# Patient Record
Sex: Male | Born: 1945 | Race: White | Hispanic: No | Marital: Married
Health system: Midwestern US, Academic
[De-identification: ages and names within clinical notes are randomized; demographics above are authoritative.]

## PROBLEM LIST (undated history)

## (undated) MED FILL — OXYCODONE 5 MG TABLET: 5 5 MG | ORAL | 4 days supply | Qty: 20 | Fill #0

## (undated) MED FILL — TIZANIDINE 2 MG TABLET: 2 2 MG | ORAL | 30 days supply | Qty: 120 | Fill #0

## (undated) MED FILL — ACETAMINOPHEN 325 MG TABLET: 325 325 MG | ORAL | 9 days supply | Qty: 100 | Fill #0

## (undated) MED FILL — NALOXONE 4 MG/ACTUATION NASAL SPRAY: 4 4 mg/actuation | NASAL | Qty: 2 | Fill #0

## (undated) MED FILL — OXYCODONE 5 MG TABLET: 5 5 MG | ORAL | 5 days supply | Qty: 28 | Fill #0

## (undated) MED FILL — SENNOSIDES 8.6 MG-DOCUSATE SODIUM 50 MG TABLET: 8.6-50 8.6-50 mg | ORAL | 15 days supply | Qty: 30 | Fill #0

## (undated) MED FILL — SENNOSIDES 8.6 MG-DOCUSATE SODIUM 50 MG TABLET: 8.6-50 8.6-50 mg | ORAL | 30 days supply | Qty: 30 | Fill #0

## (undated) MED FILL — LIDOCAINE 5 % TOPICAL PATCH: 5 5 % | TOPICAL | 30 days supply | Qty: 30 | Fill #0

## (undated) MED FILL — NALOXONE 4 MG/ACTUATION NASAL SPRAY: 4 4 mg/actuation | NASAL | 1 days supply | Qty: 2 | Fill #0

---

## 2010-02-12 NOTE — Unmapped (Signed)
Signed by Luther Bradley MA on 02/12/2010 at 16:52:36    PHONE NOTE  Call back at Work Phone: [phone]  Caller: patient  Department: Neurology  Call for: Dace Denn    Reason for Call: pt needs to speak with MA about getting his records for first appt with Dr Waunita Schooner      Initial call taken by: Juliette Alcide Rippy,  February 12, 2010 3:40 PM      FOLLOW UP  Spoke with pt. who states he is trying to get some records for [place], but when he called was told they might not get them to [place] before Tues. appt.  phone call completed  Follow-up by:  Luther Bradley MA,  February 12, 2010 4:52 PM

## 2010-02-12 NOTE — Unmapped (Signed)
Signed by Loura Halt MD on 02/12/2010 at 00:00:00  Neurology Media Consent      Imported By: Coletta Memos 05/03/2010 12:47:10    _____________________________________________________________________    External Attachment:    Please see [facility] EMR for this document.

## 2010-02-14 NOTE — Unmapped (Signed)
Signed by Loura Halt MD on 02/14/2010 at 00:00:00  Intake Neuro      Imported By: Betsey Amen 03/25/2010 13:02:40    _____________________________________________________________________    External Attachment:    Please see [facility] EMR for this document.

## 2010-02-16 ENCOUNTER — Inpatient Hospital Stay: Attending: Neurology

## 2010-02-16 NOTE — Unmapped (Signed)
Signed by Loura Halt MD on 02/16/2010 at 12:45:37    [facility]  ___________________________________________________________________________  Office Visit    History of Present Illness   Patient seen in consultation at the request of:   Matthew Apple, MD    Matthew [name] was seen in consultation for the evaluation of cervical movements and shoulder pain. He has noted a tendency for the neck to pull to the left over the last year, more consistently for the last six months. 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 Stress may make these events more common. He has noted that touching the face completely controls these movements although his wife finds that disrupting. He spoke of an urge but questioning did not reveal that these movements were truly voluntary. He has also had left shoulder blade pain, which he attributes to the need to try to keep his head in mid position, which is hard without using his hands. Additionally, he has had symptoms of regular movements of legs and arms through the night, which do not seem to represent dream enactment behaviors. Treatment with mirapex and ambien only caused excessive sedation. Clonazepam is helping with his sleep and a bit with his posturing.       PAST HISTORY  Past Medical History:  Environmental Allergies, Sleep Disorder, Trouble with head turning to the left, Rosacea  Surgical History:  Hernia Surgery: 1953, Achilles Tendon Repair: left 1/04, Arthroscopic Knee Surgery: left 1976    Family History: Mother-Deceased-Cancer of Colon  Father-Deceased-Heart Failure  Social History: Preferred Language: English,   Marital Status: married,   Children: 2,   Occupation: Pensions consultant  Alcohol Use: 3-4/wk  Drug Use: none  Tobacco  Usage:non-smoker    Review of Systems   Refer to HPI for review of systems documentation.  General: Denies chills, sweats, anorexia.   Eyes: Denies blurring, diplopia, irritation.   Ears/Nose/Throat: Complains of decreased hearing. Denies dysphagia, ear discharge.   Cardiovascular: Denies chest pains, dizziness.   Respiratory: Denies tachypnea, excessive sputum.   Gastrointestinal: Denies vomiting, diarrhea, constipation.   Genitourinary: Denies urinary frequency, urinary hesitancy, urgency.   Musculoskeletal: Complains of back pain, neck pain.   Skin: Denies skin color change, birthmarks.   Neurologic: Denies transient paralysis, weakness, word finding problems, concentration problems, gait difficulties, falls.   Psychiatric: Denies depression, anxiety, memory loss.   Endocrine: Denies cold intolerance, heat intolerance.       Vital Signs    Weight: 189 lbs. Pulse rate: 84   Blood Pressure   BP #1: 142 / 92mm Hg  Cuff Size: Std     Allergies  No Known Allergies    Medications on Intake:    CLONAZEPAM 0.5 MG TABS (CLONAZEPAM) 1 by mouth daily  DOXYCYCLINE HYCLATE 50 MG CAPS (DOXYCYCLINE HYCLATE)         Medications reviewed, updated and verified with patient or patient representative.    Screening for unhealthy alcohol use performed.  Women (Any Age) or Men (over Age 46): less than 7 standard drinks per week      Physical Examination    Mental Status   Appearance: normal  Orientation: obviously normal  Memory: grossly intact  Fund of Knowledge: grossly intact  Speech Characteristics  Normal.  Language: normal  Affect:   euthymic  Mood:  euthymic  Thought Content:     normal  Thought Process:   normal    Cranial Nerves   Pupils:   equal, round, reactive to light  Visual Fields:   full to confrontation  Extraocular Movements:     full  Smooth Pursuit:     normal  Saccade Velocity:     normal  Facial Sensation:   bilateral facial sensation normal  Facial Strength: normal facial symmetry and strength       Facial  Expression:    normal  Hearing:   diminished hearing on the left  Palate:   soft palate elevates in the midline spontaneously  Sternocleidomastoid/Trapezius:   full shoulder shrug equally  Tongue:   tongue protrudes midline    Motor Exam     Muscle Tone (0 to 4 scale with 0 being normal):     Muscle tone is normal in all muscle groups of the upper and lower extremities.    Other Motor Examinations (rated on a 0-4 scale, with 0 being absent):       Dystonia - At Rest:     Neck/Shoulder:   2    Dystonia - With Action:     Neck/Shoulder:   1      Strength:     Muscle strength is 5/5 in all major muscle groups.    Reflexes   Muscle stretch reflexes are normal in bilateral upper and lower extremities with negative Babinski responses.    Sensory Examination   Normal to light touch, pinprick, temperature, vibration and proprioception except as noted.    Temperature:     Right Lower Extremity: diminished  Left Lower Extremity: diminished    Vibration:     Right Lower Extremity: diminished  Left Lower Extremity: diminished    Coordination     Tremor:      Resting tremor is absent.   Intention tremor is absent.   Postural tremor is absent.   Action tremor is absent.      Finger-to-Nose:     Right:   no ataxia  Left:   no ataxia    Heel-Knee-Shin:     Right:   no ataxia  Left:   no ataxia    Romberg:    normal    Gait       Base:   normal    Initiation and Freezing:          Starting:   normal       Turning:   normal       Walking through doorways:   normal       Walking straight:   normal    Steps:  normal  Propulsion:   normal  Retropulsion:   normal  Right arm swing:   normal  Left arm swing:   normal  Turning:   normal  Heel walking normal  Toe walking:   normal  Tandem gait:   normal      [place] Spasmodic Torticollis Rating Scale     I. Torticollis Severity Scale          (A) Maximal Excursion                  Chin Rotation:  3 - Moderate (1/2 to 3/4 range, 46-67 degrees) left                 Laterocollis:  0 -  None  Anterocollis or Retrocolllis Rating:  0 - None               Lateral Shift:  0 - Absent               Sagittal Shift:  0 - Absent      (B) Duration:  :  5 - Constant deviation (>75% of the time, often maximal)        (C) Effects of Sensory Tricks:  0 - Complete relief by one or more tricks          (D) Shoulder Elevation:  1 - Mild (less than 1/3 range), intermittent or constant          (E) Range of Motion  0 - Able to move to extreme opposite position          (F) Time:  4 - < 15 seconds    Torticollis Severity Scale (out of 35):   18    II.  Disability Scale                  Work:  1 - Normal work expectations with satisfactory performance at usual level of occupation but some interference by torticollis                 Activities of Daily Living (e.g., feeding, dressing, or hygiene, including washing, shaving, makeup, etc.):  0 - No difficulty with any activity                 Driving:  0 - No difficulty (or has never driven a car)                 Reading:  1 - Unlimited ability to read in normal seated position but bothered by torticollis                 Television:  1 - Unlimited ability to watch television in normal seated position but bothered by torticollis                 Activities outside the home (e.g., shopping, walking about, movies, dining, and other recreational activities):  1 - Unlimited activities but bothered by torticollis    Total Disability Scale (out of 30):   4    III.  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 There were no additional abnormalties on neurological exam, except for chronic peripheral neuropathy, to suggest segmental spread or associated deficits. 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    Plan   After discussing the various options, we decided in favor of initiating a trial with Dysport for cervical dystonia. He will also participate  in the Anchor CD study to gather additional quality of life information before and after injections with Dysport. He may also consider participating in a natural history and biorepository study sponsored by the [employer]. We will see him again within the context of the chemodenervation session.    CC:   Matthew Eaton, MD  Matthew Kerns, MD

## 2010-02-17 NOTE — Unmapped (Signed)
Signed by Loura Halt MD on 02/17/2010 at 00:00:00  Transferred Records       Imported By: Coletta Memos 05/02/2010 22:59:39    _____________________________________________________________________    External Attachment:    Please see [facility] EMR for this document.

## 2010-02-26 NOTE — Unmapped (Signed)
Signed by Loura Halt MD on 03/01/2010 at 12:01:57    PHONE NOTE  Caller's Cell Phone #: [phone]  Caller: patient  Department: Neurology    Reason for Call: pt called to schedule btx appt. was under the impression he was to get a call from office and would like feb. appt. I don't have anything avail on his schedule in feb.      Initial call taken by: Campbell Stall,  February 26, 2010 10:51 AM      FOLLOW UP  He can be scheduled as an add-on (squeeze-in) to the February Botox clinic, which I believe is on February 24. Christella Hartigan will also need to come in as this patient will be filling out some questionnaires in addition to his Botox injection.  Follow-up by:  Loura Halt MD,  March 01, 2010 12:01 PM

## 2010-03-03 NOTE — Unmapped (Signed)
Signed by Luther Bradley MA on 03/03/2010 at [facility]                        [facility]         Neurology         [address]         [place], [place] [address]         p [phone] f [phone]         [email or web address]      March 03, 2010      RE: DADE RODIN  DOB:   [birth year]      Dear Dr. Tyrone Apple,    I had the pleasure of seeing your patient, Matthew [name], in consultation in the [facility] on 02/26/2010.     History of Present Illness:  Mr. Gabrielson was seen in consultation for the evaluation of cervical movements and shoulder pain. He has noted a tendency for the neck to pull to the left over the last year, more consistently for the last six months. 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 Stress may make these events more common. He has noted that touching the face completely controls these movements although his wife finds that disrupting. He spoke of an urge but questioning did not reveal that these movements were truly voluntary. He has also had left shoulder blade pain, which he attributes to the need to try to keep his head in mid position, which is hard without using his hands. Additionally, he has had symptoms of regular movements of legs and arms through the night, which do not seem to represent dream enactment behaviors. Treatment with mirapex and ambien only caused excessive sedation. Clonazepam is helping with his sleep and a bit with his posturing.     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 There were no additional abnormalties on neurological exam, except for chronic peripheral neuropathy, to suggest segmental spread or associated deficits. 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     Plan:  After discussing the various options, we decided in favor of initiating a trial with Dysport for cervical dystonia. He will also participate in the Anchor CD study to gather additional quality of life information before and after injections with Dysport. He may also consider participating in a natural history and biorepository study sponsored by the [employer]. We will see him again within the context of the chemodenervation session.     Thank you for allowing me to participate in the care of this pleasant patient.  If you would like a copy of my office note, please contact our Medical Records department at [phone].  Please feel free to contact me should you have any concerns or questions.      Sincerely,      Loura Halt, MD

## 2010-03-12 NOTE — Unmapped (Signed)
Signed by Luther Bradley MA on 03/12/2010 at 15:23:12    PHONE NOTE  Call back at Work Phone: [phone]  Caller: patient  Department: Neurology  Call for: Mycala Warshawsky    Reason for Call: refill: klonopin .5mg  tabs, uses [facility] [phone]      Initial call taken by: Juliette Alcide Rippy,  March 12, 2010 3:07 PM      Prescriptions:  CLONAZEPAM 0.5 MG TABS (CLONAZEPAM) 1 by mouth daily  #30 x 1   Entered by: Luther Bradley MA   Authorized by: Loura Halt MD   Signed by: Luther Bradley MA on 03/12/2010   Method used: Telephoned to ...     [facility]retail)     [address]     [place], [place]  [address]  [place]     Ph: [phone]     Fax: [phone]   RxID: [id number]

## 2010-03-15 NOTE — Unmapped (Signed)
Signed by Loura Halt MD on 03/15/2010 at 00:00:00  Prior Authorization      Imported By: Coletta Memos 05/04/2010 22:55:04    _____________________________________________________________________    External Attachment:    Please see [facility] EMR for this document.

## 2010-03-25 NOTE — Unmapped (Signed)
Signed by Loura Halt MD on 03/25/2010 at 00:00:00  Botox Consent      Imported By: Coletta Memos 05/06/2010 10:48:56    _____________________________________________________________________    External Attachment:    Please see [facility] EMR for this document.

## 2010-03-26 ENCOUNTER — Inpatient Hospital Stay: Attending: Neurology

## 2010-03-26 NOTE — Unmapped (Addendum)
Signed by Loura Halt MD on 03/26/2010 at 08:29:49    [facility]  ___________________________________________________________________________  Office Visit    History of Present Illness   Patient seen in consultation at the request of:   Tyrone Apple, MD    Mr. Klipfel returned for his first injection session with Dysport to address his right torticollis and mild left shoulder elevation. EMG guidance ensured injections were given to the core of the dystonic activity of the right sternocleidomastoid, left splenius capitis, and left trapezius (painful area). He tolerated the procedure well.            Vital Signs    Weight: 187 lbs. Pulse rate: 62   Blood Pressure   BP #1: 122 / 78mm Hg  Cuff Size: Std     Allergies  No Known Allergies  Medications reviewed, updated and verified with patient or patient representative.    Screening for unhealthy alcohol use performed.  Previous Screening:   Screening for unhealthy alcohol use performed. (02/16/2010 11:03:55 AM)  Women (Any Age) or Men (over Age 79): less than 7 standard drinks per week      Physical Examination          Botox Injection     Indications:     Diagnosis:  DYSTONIA - CERVICAL (HYQ-657.84)  Operator: Loura Halt MD    Procedure:   After informed consent was obtained, the patient was injected with  500 Dysport  diluted in 2 ml of normal saline were injected in the following areas:     - 187.5 units in the right sternocleidomastoid in 3 divided doses.    - 125 units in the left splenius capitis in 2 divided doses.    - 125 units in the left trapezius (painful areas, interscapular region) in 2 divided doses.  The injections were performed under EMG guidance.  The patient tolerated the procedure well and without side effects.  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    Plan   A three month reinjection session is recommended. He is also participating in the Anchor-CD study, to collect clinical outcome data on patients with CD injected with Dysport.    CC:   Rebecca Eaton, MD  Sherren Kerns, MD

## 2010-05-03 ENCOUNTER — Inpatient Hospital Stay

## 2010-05-03 NOTE — Unmapped (Signed)
Signed by Wynona Dove on 05/26/2010 at 16:19:41    Matthew [name] was seen today part of the Anchor/Dysport study along with Dr. Waunita Schooner. All study assessments and requirements were met per the protocol.

## 2010-05-03 NOTE — Unmapped (Signed)
Signed by Dairl Ponder on 05/03/2010 at 10:27:14    Prescriptions:  CLONAZEPAM 0.5 MG TABS (CLONAZEPAM) 1 by mouth daily  #30 x 5   Entered by: Dairl Ponder   Authorized by: Loura Halt MD   Signed by: Dairl Ponder on 05/03/2010   Method used: Print then Give to Patient   RxID: [id number]

## 2010-06-21 NOTE — Unmapped (Signed)
Signed by Luther Bradley MA on 06/23/2010 at 14:37:42    PHONE NOTE  Call back at Work Phone: [phone]  Caller: patient  Department: Neurology  Call for: Jordell Outten    Reason for Call: Pt asks for you to please call him at work. Thanks!       Initial call taken by: Catalina Lunger,  Jun 21, 2010 1:46 PM      FOLLOW UP  LMOV for him to call me back if he still needs to speak to me.   Follow-up by:  Dairl Ponder,  Jun 21, 2010 2:07 PM    FOLLOW UP  another message. I will forward back to [employer] desktop as I will be in clinic tomorrow and wont be watching her desktop.  left message to call back  Follow-up by:  Dairl Ponder,  Jun 21, 2010 2:56 PM    FOLLOW UP  just talked to the pt and he said that the study would pay for part of the dysport inj.   he has an ok from the ins co to get inj also.  he received a bill for over 10, 000.00.   i emailed Marcelino Duster K and Dr. Waunita Schooner to talk to the pt about how much each should pay.   Follow-up by:  Dairl Ponder,  Jun 21, 2010 3:26 PM    FOLLOW UP  Spoke with pt. and told him I had spoken with Marylene Land the manager, and she said it was a billing error, that would be adjusted and he would receive a new bill..  patient advised  Follow-up by:  Luther Bradley MA,  Jun 23, 2010 2:37 PM

## 2010-06-25 ENCOUNTER — Inpatient Hospital Stay

## 2010-06-25 NOTE — Unmapped (Signed)
Signed by Loura Halt MD on 06/25/2010 at 17:14:43    [facility]  ___________________________________________________________________________  Office Visit    History of Present Illness   Patient seen in consultation at the request of:   Tyrone Apple, MD    Mr. Zeidman returned for his second injection session with Dysport to address his left torticollis and mild left shoulder elevation. EMG guidance ensured injections were given to the core of the dystonic activity of the right sternocleidomastoid, left splenius capitis, and left trapezius (painful area). We increased the dose of the left splenius capitis. He tolerated the procedure well.            Vital Signs    Weight: 188 lbs. Pulse rate: 70   Blood Pressure   BP #1: 120 / 80mm Hg  Cuff Size: Std     Allergies  No Known Allergies  Medications reviewed, updated and verified with patient or patient representative.    Screening for unhealthy alcohol use performed.  Previous Screening:   Screening for unhealthy alcohol use performed. (03/26/2010 8:21:49 AM)  Women (Any Age) or Men (over Age 23): less than 7 standard drinks per week    Depression Screening:     During the past month,   Have you often been bothered by feeling down, depressed or hopeless? No  Have you often been bothered by little interest or pleasure in doing things? 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    - 250 units in the left splenius capitis (superficial) in 4 divided doses.    - 125 units in the left trapezius (painful areas, interscapular region) in 2 divided doses.  The injections were  performed under EMG guidance.  The patient tolerated the procedure well and without side effects.  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    Plan   A three month reinjection session is recommended. He is also participating in the Anchor-CD study, to collect clinical outcome data on patients with CD injected with Dysport.    CC:   Rebecca Eaton, MD  Sherren Kerns, MD

## 2010-06-25 NOTE — Unmapped (Signed)
Signed by Wynona Dove on 06/29/2010 at 08:57:44    [facility]  ___________________________________________________________________________  Office Visit    History of Present Illness   Patient seen in consultation at the request of:   Tyrone Apple, MD                Allergies  No Known Allergies  Previous Screening:   Screening for unhealthy alcohol use performed. (06/25/2010 4:45:43 PM)      Physical Examination            Problems:    Patient was seen today in the office for the Dysport(Anchor study). All study Assessments were completed per the protocol. Patient extremely happy with the treatment      CC:   Rebecca Eaton, MD  Sherren Kerns, MD

## 2010-06-25 NOTE — Unmapped (Addendum)
Signed by Loura Halt MD on 06/25/2010 at 00:00:00  Botox Consent      Imported By: Coletta Memos 07/02/2010 11:57:58    _____________________________________________________________________    External Attachment:    Please see [employer] EMR for this centepic_document_incr.

## 2010-07-13 ENCOUNTER — Inpatient Hospital Stay

## 2010-07-13 NOTE — Unmapped (Signed)
Signed by Loura Halt MD on 07/13/2010 at 17:21:37    [facility]  ___________________________________________________________________________  Office Visit    History of Present Illness   Patient seen in consultation at the request of:   Tyrone Apple, MD    Patient was seen today in the office for the Natural History Dystonia Study.  Patient agreed to participate in  the trial and signed consent for the study.  All study required assessments were completed as required by physician or study coordinator.    [facility]  Loura Halt, MD              Allergies  No Known Allergies  Previous Screening:   Screening for unhealthy alcohol use performed. (06/25/2010 4:45:43 PM)      Physical Examination            CC:   Rebecca Eaton, MD  Sherren Kerns, MD

## 2010-09-10 ENCOUNTER — Inpatient Hospital Stay: Attending: Neurology

## 2010-09-10 ENCOUNTER — Ambulatory Visit: Admit: 2010-09-10 | Discharge: 2010-09-10 | Payer: PRIVATE HEALTH INSURANCE | Attending: Neurology

## 2010-09-10 DIAGNOSIS — G243 Spasmodic torticollis: Secondary | ICD-10-CM

## 2010-09-10 MED ORDER — abobotulinum toxin A (DYSPORT) SolR 1,000 Units
500 | Freq: Once | INTRAMUSCULAR | Status: AC
Start: 2010-09-10 — End: 2010-09-10
  Administered 2010-09-10: 20:00:00 via INTRAMUSCULAR

## 2010-09-10 NOTE — Unmapped (Signed)
Subjective:      Patient ID: Matthew [name] is a 65 y.o. male.    HPI   Matthew [name] returned for his second injection session with Dysport to address his left torticollis and mild left shoulder elevation. EMG guidance ensured injections were given to the core of the dystonic activity of the right sternocleidomastoid, left splenius capitis, and left trapezius (painful area). We increased the dose of the left splenius capitis. He tolerated the procedure well.          Histories:     He has a past medical history of Environmental allergies; Sleep disorder; and Neck symptom.    He has past surgical history that includes Hernia repair [phone]); Achilles tendon repair (1/04); and Knee arthroscopy (1976).    His family history includes Colon cancer in his mother and Heart failure in his father.    He reports that he has never smoked. He does not have any smokeless tobacco history on file. He reports that he drinks alcohol. He reports that he does not use illicit drugs.      Review of Systems    Allergies:   Review of patient's allergies indicates no known allergies.    Medications:     Outpatient Encounter Prescriptions as of 09/10/2010   Medication Sig Dispense Refill   ??? clonazepam (KLONOPIN) 0.5 MG tablet Take 0.5 mg by mouth daily.         ??? doxycycline (VIBRAMYCIN) 50 MG capsule Take 50 mg by mouth.              Objective:       Blood pressure 128/60, pulse 60, weight 188 lb (85.276 kg).    Neurologic Exam    Physical Exam    Botulinum Toxin Injection Summary  Botox  Botulinum toxin used: Dysport  Dilution (units/volume): 500/2 ml  Total units used: 625   Number of units discarded: 375  EMG Guidance: The injections were performed under EMG guidance.  Muscle 1  Muscle 1: L Splenius capitus  Muscle 1 - Units: 375  Muscle 1 - # Sites: 6  Muscle 2  Muscle 2: R Sternocleidomastoid  Muscle 2 - Units: 125  Muscle 2 - # Sites: 2  Muscle 3  Muscle 3: L Trapezius  Muscle 3 - Units: 125  Muscle 3 - # Sites:  2    Diagnosis:    1. Spasmodic torticollis         Assessment:     EMG-guided injections with Dysport took place uneventfully to address his left torticollis and mild left shoulder elevation.     Plan:     A three month reinjection session is recommended. He is also participating in the Anchor-CD study, to collect clinical outcome data on patients with CD injected with Dysport

## 2010-09-13 NOTE — Unmapped (Incomplete)
Patient was seen today in the office for the Dysport/Anchor Study. Subject agreed to participate, consent was signed . All study required assessments were completed as required by physician and Study Coordinator. Patient is responding well and satisfied with the treatment.    Carlyon Shadow, MD

## 2010-11-10 NOTE — Unmapped (Signed)
Called Rx into pharm          No problem, [name]. [name] is the expert in the refill department. I am copying her. She will do this tomorrow.    All the best,  [name]      From: [name] [email or web address]  Date: Wednesday, November 10, 2010 5:14 PM  To: [name]>  Subject: Klonopin    Dr.,  Can you renew my prescription for Klonopin?  It helps me sleep better.    I use the [employer], [phone]  Thanks,  Matthew [name]

## 2010-11-11 MED ORDER — clonazePAM (KLONOPIN) 0.5 MG tablet
0.5 | ORAL_TABLET | Freq: Every day | ORAL | Status: AC
Start: 2010-11-11 — End: 2011-02-07

## 2010-12-17 ENCOUNTER — Ambulatory Visit: Admit: 2010-12-17 | Discharge: 2010-12-17 | Payer: PRIVATE HEALTH INSURANCE | Attending: Neurology

## 2010-12-17 ENCOUNTER — Encounter: Payer: PRIVATE HEALTH INSURANCE | Attending: Neurology

## 2010-12-17 DIAGNOSIS — G243 Spasmodic torticollis: Secondary | ICD-10-CM

## 2010-12-17 MED ORDER — abobotulinum toxin A (DYSPORT) SolR 1,000 Units
500 | Freq: Once | INTRAMUSCULAR | Status: AC
Start: 2010-12-17 — End: 2010-12-17
  Administered 2010-12-17: 16:00:00 via INTRAMUSCULAR

## 2010-12-17 NOTE — Unmapped (Signed)
Subjective:      Patient ID: Matthew [name] is a 65 y.o. male.    Movement Disorders HPI    Mr. Shrewsberry returned for his third injection session with Dysport to address his left torticollis and mild left shoulder elevation. EMG guidance ensured injections were given to the core of the dystonic activity of the right sternocleidomastoid, left splenius capitis, and left trapezius (painful area). We kept the dose the same as his last procedure, given the reported benefits. He tolerated the procedure well.        Histories:     He has a past medical history of Environmental allergies; Sleep disorder; and Neck symptom.    He has past surgical history that includes Hernia repair [phone]); Achilles tendon repair (1/04); and Knee arthroscopy (1976).    His family history includes Colon cancer in his mother and Heart failure in his father.    He reports that he has never smoked. He does not have any smokeless tobacco history on file. He reports that he drinks alcohol. He reports that he does not use illicit drugs.    Review of Systems  Refer to HPI for review of systems documentation.    Allergies:   Review of patient's allergies indicates no known allergies.    Medications:     Outpatient Encounter Prescriptions as of 12/17/2010   Medication Sig Dispense Refill   ??? clonazePAM (KLONOPIN) 0.5 MG tablet Take 1 tablet (0.5 mg total) by mouth daily.  30 tablet  2   ??? doxycycline (VIBRAMYCIN) 50 MG capsule Take 50 mg by mouth.             Objective:       Blood pressure 120/78, pulse 64, height 6' (1.829 m), weight 186 lb (84.369 kg).    Botulinum Toxin Injection Summary  Botox  Botulinum toxin used: Dysport  Dilution (units/volume): 500:2  Total units used: 625   Number of units discarded: 375  EMG Guidance: The injections were performed under EMG guidance.  Muscle 1  Muscle 1: L Splenius capitus  Muscle 1 - Units: 375  Muscle 1 - # Sites: 6  Muscle 2  Muscle 2: R Sternocleidomastoid  Muscle 2 - Units: 125  Muscle 2  - # Sites: 2  Muscle 3  Muscle 3: L Trapezius  Muscle 3 - Units: 125  Muscle 3 - # Sites: 2          Assessment:     1. Spasmodic torticollis      EMG-guided injections with Dysport took place uneventfully to address his left torticollis and mild left shoulder elevation.    Plan:     A three month reinjection session is recommended.

## 2010-12-20 NOTE — Unmapped (Unsigned)
Patient was seen in the office today for the last visit (Anchor Dysport Study), all visits completed per protocol compliance. All necessary study assessments were completed by PI and study coordinator. Patient was extremely pleased with the treatment and Study  outcome.    Wynona Dove

## 2011-02-07 NOTE — Unmapped (Signed)
Pt is needing a refill on his klonopin medication. [facility] in [place] [phone]

## 2011-02-08 MED ORDER — clonazePAM (KLONOPIN) 0.5 MG tablet
0.5 | ORAL_TABLET | Freq: Every day | ORAL | Status: AC
Start: 2011-02-08 — End: 2011-08-11

## 2011-03-25 ENCOUNTER — Ambulatory Visit: Admit: 2011-03-25 | Discharge: 2011-03-25 | Payer: PRIVATE HEALTH INSURANCE | Attending: Neurology

## 2011-03-25 DIAGNOSIS — G243 Spasmodic torticollis: Secondary | ICD-10-CM

## 2011-03-25 MED ORDER — ondansetron (ZOFRAN) 8 MG tablet
8 | ORAL_TABLET | Freq: Three times a day (TID) | ORAL | Status: AC | PRN
Start: 2011-03-25 — End: 2014-02-04

## 2011-03-25 MED ORDER — abobotulinum toxin A (DYSPORT) SolR 1,000 Units
500 | Freq: Once | INTRAMUSCULAR | Status: AC
Start: 2011-03-25 — End: 2011-03-25
  Administered 2011-03-25: 14:00:00 via INTRAMUSCULAR

## 2011-03-25 NOTE — Unmapped (Signed)
Dear [name],    Thank you for the update. Glad to know he is feeling better now. I would agree that the method of delivery is not suitable for him. As such, I will suggest to move away from the EMG guidance that we have used for the injections and use instead anatomical landmarks. As briefly mentioned, this method has a lower accuracy rate but may not be associated with significantly less benefits. Importantly, we would not need to use the EMG needles. Instead, we would only use the regular 30 ga needles, which are tiny. Most people don't feel them at all.    I am so sorry that I caused this episode of vasovagal syncope. Hopefully this will be his last!    Take good care and keep in touch,  [name]    From: Shanon Brow [email or web address]>  Date: Friday, March 25, 2011 2:18 PM  To: Alberto Espay [email or web address]>  Cc: [name] [email or web address]>, Lauren [email or web address]>  Subject: Shawn Stall  Resent-From: [name] [email or web address]>  Resent-To: [name] work [email or web address]>  Resent-Date: Friday, March 25, 2011 2:23 PM    Dr. Waunita Schooner,     [name] asked me to email you since he's trying to relax right now.  I am also copying our daughter, who is a pediatric hospitalist.  (If you recall, she has a physician friend here in [place] who recommended you to [place].)    We left the hospital just after 10:00 a.m. immediately after [name] took Zofran with a small sip of water, and then he had violent vomiting in the car on the way home.  It was quite unpleasant since he did not have much in his stomach.  He went to bed and just woke up at about 1:30.  He has had soup and no longer feels nauseous.  His upper body feels stiff and he feels tired.    He said that when you were injecting his left side, each injection hurt.  He does not remember that each hurt in the past.  He did not feel much with the injections on the right side (although that's where he  has a big bruise).      In discussing with him whether he thinks his reactions are due to the medicine or to the method of delivery, he believes that today's was probably due to the delivery, but that the previous weeks-long negative reaction after his 3rd treatment was due to the Dysport.  He may have also reacted to the delivery method immediately after he left the office, but since he had flu-like symptoms for more than three weeks, he feels that that was a reaction to the medicine.      Thank you for your attention.  [name]   [phone] (c)  [phone] (h)

## 2011-03-25 NOTE — Unmapped (Signed)
Subjective:      Patient ID: Matthew [name] is a 66 y.o. male.    Movement Disorders HPI    Matthew [name] returned for his reinjection session with Dysport to address his left torticollis and mild left shoulder elevation. Response has been excellent. EMG guidance ensured injections were given to the core of the dystonic activity of the right sternocleidomastoid, left splenius capitis, and left trapezius (painful area). We kept the dose the same as his last procedure, given the reported benefits. His injections were complicated with a vasovagal syncope.           Histories:     He has a past medical history of Environmental allergies; Sleep disorder; and Neck symptom.    He has past surgical history that includes Hernia repair [phone]); Achilles tendon repair (1/04); and Knee arthroscopy (1976).    His family history includes Colon cancer in his mother and Heart failure in his father.    He reports that he has never smoked. He does not have any smokeless tobacco history on file. He reports that he drinks alcohol. He reports that he does not use illicit drugs.    Review of Systems  Refer to HPI for review of systems documentation.    Allergies:   Review of patient's allergies indicates no known allergies.    Medications:     Outpatient Encounter Prescriptions as of 03/25/2011   Medication Sig Dispense Refill   ??? clonazePAM (KLONOPIN) 0.5 MG tablet Take 1 tablet (0.5 mg total) by mouth daily.  30 tablet  5   ??? doxycycline (VIBRAMYCIN) 50 MG capsule Take 50 mg by mouth.             Objective:       Blood pressure 120/82, pulse 68, height 6' (1.829 m), weight 185 lb (83.915 kg).    Botulinum Toxin Injection Summary  Botox  Botulinum toxin used: Dysport  Dilution (units/volume): 500:2  Total units used: 625   Number of units discarded: 375  EMG Guidance: The injections were performed under EMG guidance.  Muscle 1  Muscle 1: L Splenius capitus  Muscle 1 - Units: 375  Muscle 1 - # Sites: 6  Muscle 2  Muscle 2: R  Sternocleidomastoid  Muscle 2 - Units: 125  Muscle 2 - # Sites: 2  Muscle 3  Muscle 3: L Trapezius  Muscle 3 - Units: 125  Muscle 3 - # Sites: 2     Assessment  Tolerated Procedure:  (He developed a vasovagal reflex with the EMG needle upon the)         Assessment:     1. Spasmodic torticollis      EMG-guided injections with Dysport took were given to address his left torticollis and mild left shoulder elevation. The procedure was complicated by the development of vasovagal syncope, from which he recovered after a period of observation.      Plan:     A three month reinjection is recommended. EMG needles will not be used then for guidance, to reduce the risk of vasovagal responses.

## 2011-03-28 NOTE — Unmapped (Signed)
We will let you know if [name] develops any more negative reaction.  He feels better this morning and doesn't have any flu-like symptoms, so it seems like yesterday's episode has resolved.     Thank you again, and have a good weekend.  It's nice to see sun!    [name]  [phone]      On Mar 26, 2011, at 9:13 AM, Espay, [name] ([email or web address]) [email or web address]> wrote:  Dear [name],    Thank you for this additional insight. Because of these observations, it may be that he is at an enhanced risk of developing unusual vasovagal reactions such as what he experienced at the clinic.    Perhaps to further minimize any undesirable reactions in the future, I will use a non-EMG needle and (depending on his response to Friday's injection session) move him on to another botulinum toxin, Xeomin (which should be equally efficacious compared to Dysport).    I am not sure how to further explore this issue of his sensitivity to injections but hopefully the measures above (certainly the needle change, if it is the only one) will greatly minimize future risks.    Take good care,  [name]

## 2011-03-28 NOTE — Unmapped (Signed)
Thanks, Dr. Waunita Schooner. I did understand from you this morning that you will not use the EMG again. Matthew [name] and I really wanted to point out, though, that the first reaction was probably due to the Dysport itself besides maybe having had a vasovagal response to the EMG guidance.  Because Matthew [name] was ill for so long and his symptoms started immediately after the treatment, it seems that the medicine actually made him sick.  The concern is if that type of flu-like reaction occurs again, then Matthew [name] may not be able to tolerate this medicine. However, he is benefitting from the treatment, so we will remain hopeful that Matthew [name] can tolerate it.     For a long time I have realized that Matthew [name] reacts to touch differently than I. One of the reasons we went to see you, besides the dystonia, was his difficulty in getting to sleep. He almost needs me to very lightly, gently touch his skin (not really massage his muscles) in order to relax, and it usually takes a long time. If I hit certain spots, he'll flinch.  So, I myself haven't figured out the right way to soothe him after all this time.  However, whatever I do does seem to help and he almost can't sleep without it.     We appreciate that you are very considerate and try to be as gentle as possible.     Thank you for getting back to [place].     Matthew [name]  [phone]

## 2011-03-30 NOTE — Unmapped (Unsigned)
Patient was seen in the office today for the Dysport Anchor Study. All required Assessments were completed as required per Protocol, PI and coordinator.Matthew [name]

## 2011-07-01 ENCOUNTER — Ambulatory Visit: Admit: 2011-07-01 | Discharge: 2011-07-01 | Payer: PRIVATE HEALTH INSURANCE | Attending: Neurology

## 2011-07-01 DIAGNOSIS — G243 Spasmodic torticollis: Secondary | ICD-10-CM

## 2011-07-01 MED ORDER — abobotulinum toxin A (DYSPORT) SolR 1,000 Units
500 | Freq: Once | INTRAMUSCULAR | Status: AC
Start: 2011-07-01 — End: 2011-07-01
  Administered 2011-07-01: 14:00:00 via INTRAMUSCULAR

## 2011-07-01 NOTE — Unmapped (Signed)
Subjective:      Patient ID: Matthew [name] is a 65 y.o. male.    Movement Disorders HPI    Matthew [name] returned for his reinjection session with Dysport to address his left torticollis and mild left shoulder elevation. Response has been excellent. This time we did not use EMG guidance into the right sternocleidomastoid, left splenius capitis, and left trapezius (painful area) because of the vasovagal syncope with which the prior session was complicated. The total dose and distribution was kept the same.         Histories:     He has a past medical history of Environmental allergies; Sleep disorder; and Neck symptom.    He has past surgical history that includes Hernia repair [phone]); Achilles tendon repair (1/04); and Knee arthroscopy (1976).    His family history includes Colon cancer in his mother and Heart failure in his father.    He reports that he has never smoked. He does not have any smokeless tobacco history on file. He reports that he drinks alcohol. He reports that he does not use illicit drugs.    Review of Systems  Refer to HPI for review of systems documentation.    Allergies:   Review of patient's allergies indicates no known allergies.    Medications:     Outpatient Encounter Prescriptions as of 07/01/2011   Medication Sig Dispense Refill   ??? clonazePAM (KLONOPIN) 0.5 MG tablet Take 1 tablet (0.5 mg total) by mouth daily.  30 tablet  5   ??? doxycycline (VIBRAMYCIN) 50 MG capsule Take 50 mg by mouth.         ??? ondansetron (ZOFRAN) 8 MG tablet Take 1 tablet (8 mg total) by mouth every 8 hours as needed.  30 tablet  0       Objective:       There were no vitals taken for this visit.    Botulinum Toxin Injection Summary  Botox  Botulinum toxin used: Dysport  Dilution (units/volume): 500:2  Total units used: 625   Number of units discarded: 375  Muscle 1  Muscle 1: L Splenius capitus  Muscle 1 - Units: 375  Muscle 1 - # Sites: 6  Muscle 2  Muscle 2: R Sternocleidomastoid  Muscle 2 - Units:  125  Muscle 2 - # Sites: 2  Muscle 3  Muscle 3: L Trapezius  Muscle 3 - Units: 125  Muscle 3 - # Sites: 2       Assessment:     1. Spasmodic torticollis        Non-EMG-guided injections with Dysport took were given to address his left torticollis and mild left shoulder elevation. The procedure was tolerated well this time.      Plan:     A three month reinjection is recommended.

## 2011-08-12 MED ORDER — clonazePAM (KLONOPIN) 0.5 MG tablet
0.5 | ORAL_TABLET | Freq: Every day | ORAL | Status: AC
Start: 2011-08-12 — End: 2012-02-07

## 2011-09-30 ENCOUNTER — Ambulatory Visit: Admit: 2011-09-30 | Discharge: 2011-09-30 | Payer: PRIVATE HEALTH INSURANCE | Attending: Neurology

## 2011-09-30 DIAGNOSIS — G243 Spasmodic torticollis: Secondary | ICD-10-CM

## 2011-09-30 MED ORDER — abobotulinum toxin A (DYSPORT) SolR 1,000 Units
500 | Freq: Once | INTRAMUSCULAR | Status: AC
Start: 2011-09-30 — End: 2011-09-30
  Administered 2011-09-30: 16:00:00 via INTRAMUSCULAR

## 2011-09-30 NOTE — Unmapped (Signed)
Subjective:      Patient ID: Jamauri Kruzel is a 66 y.o. male.    Movement Disorders HPI    Mr. Diodato returned for his reinjection session with Dysport to address his left torticollis and mild left shoulder elevation. Response has been excellent. This time we did not use EMG guidance into the right sternocleidomastoid, left splenius capitis, and left trapezius (painful area) because of the vasovagal syncope with which the prior session was complicated. The total dose and distribution was kept the same.       Histories:     He has a past medical history of Environmental allergies; Sleep disorder; and Neck symptom.    He has past surgical history that includes Hernia repair [phone]); Achilles tendon repair (1/04); and Knee arthroscopy (1976).    His family history includes Colon cancer in his mother and Heart failure in his father.    He reports that he has never smoked. He does not have any smokeless tobacco history on file. He reports that he drinks alcohol. He reports that he does not use illicit drugs.    Review of Systems  Refer to HPI for review of systems documentation.    Allergies:   Review of patient's allergies indicates no known allergies.    Medications:     Outpatient Encounter Prescriptions as of 09/30/2011   Medication Sig Dispense Refill   ??? atenolol (TENORMIN) 50 MG tablet        ??? clonazePAM (KLONOPIN) 0.5 MG tablet Take 1 tablet (0.5 mg total) by mouth daily.  30 tablet  5   ??? doxycycline (VIBRAMYCIN) 50 MG capsule Take 50 mg by mouth.         ??? finasteride (PROSCAR) 5 mg tablet        ??? LUMIGAN 0.01 % Drop        ??? ondansetron (ZOFRAN) 8 MG tablet Take 1 tablet (8 mg total) by mouth every 8 hours as needed.  30 tablet  0       Objective:       Blood pressure 138/78, pulse 70, height 6' (1.829 m), weight 185 lb (83.915 kg).    Botulinum Toxin Injection Summary  Botox  Botulinum toxin used: Dysport  Dilution (units/volume): 500:2  Total units used: 625   Number of units discarded:  375  Muscle 1  Muscle 1: L Splenius capitus  Muscle 1 - Units: 375  Muscle 1 - # Sites: 6  Muscle 2  Muscle 2: R Sternocleidomastoid  Muscle 2 - Units: 125  Muscle 2 - # Sites: 2  Muscle 3  Muscle 3: L Trapezius  Muscle 3 - Units: 125  Muscle 3 - # Sites: 2       Assessment:     1. Spasmodic torticollis      Non-EMG-guided injections with Dysport took were given to address his left torticollis and mild left shoulder elevation. The procedure was tolerated well this time.      Plan:     A three month reinjection is recommended.

## 2011-12-23 ENCOUNTER — Ambulatory Visit: Admit: 2011-12-23 | Discharge: 2011-12-23 | Payer: PRIVATE HEALTH INSURANCE | Attending: Neurology

## 2011-12-23 DIAGNOSIS — G243 Spasmodic torticollis: Secondary | ICD-10-CM

## 2011-12-23 MED ORDER — abobotulinum toxin A (DYSPORT) SolR 1,000 Units
500 | Freq: Once | INTRAMUSCULAR | Status: AC
Start: 2011-12-23 — End: 2011-12-23
  Administered 2011-12-23: 15:00:00 via INTRAMUSCULAR

## 2011-12-23 NOTE — Unmapped (Signed)
Subjective:      Patient ID: Matthew [name] is a 66 y.o. male.    Movement Disorders HPI    Matthew [name] returned for his reinjection session with Dysport to address his left torticollis and mild left shoulder elevation. Response has been excellent. We again did not use EMG guidance into the right sternocleidomastoid, left splenius capitis, and left trapezius (painful area) because of the vasovagal syncope with which a session was complicated. The total dose and distribution was kept the same.         Histories:     He has a past medical history of Environmental allergies; Sleep disorder; and Neck symptom.    He has past surgical history that includes Hernia repair [phone]); Achilles tendon repair (1/04); and Knee arthroscopy (1976).    His family history includes Colon cancer in his mother and Heart failure in his father.    He reports that he has never smoked. He does not have any smokeless tobacco history on file. He reports that  drinks alcohol. He reports that he does not use illicit drugs.    Review of Systems  Refer to HPI for review of systems documentation.    Allergies:   Review of patient's allergies indicates no known allergies.    Medications:     Outpatient Encounter Prescriptions as of 12/23/2011   Medication Sig Dispense Refill   ??? cholecalciferol, vitamin D3, 10,000 unit Cap Take 10,000 Units by mouth daily.       ??? meloxicam (MOBIC) 15 MG tablet Take 15 mg by mouth daily.       ??? atenolol (TENORMIN) 50 MG tablet        ??? clonazePAM (KLONOPIN) 0.5 MG tablet Take 1 tablet (0.5 mg total) by mouth daily.  30 tablet  5   ??? doxycycline (VIBRAMYCIN) 50 MG capsule Take 50 mg by mouth.         ??? finasteride (PROSCAR) 5 mg tablet        ??? LUMIGAN 0.01 % Drop        ??? ondansetron (ZOFRAN) 8 MG tablet Take 1 tablet (8 mg total) by mouth every 8 hours as needed.  30 tablet  0     No facility-administered encounter medications on file as of 12/23/2011.       Objective:       Blood pressure 122/82,  pulse 74, height 6' (1.829 m), weight 184 lb (83.462 kg).    Botulinum Toxin Injection Summary  Botox  Botulinum toxin used: Dysport  Dilution (units/volume): 500:2  Total units used: 625   Number of units discarded: 375  Muscle 1  Muscle 1: L Splenius capitus  Muscle 1 - Units: 375  Muscle 1 - # Sites: 6  Muscle 2  Muscle 2: R Sternocleidomastoid  Muscle 2 - Units: 125  Muscle 2 - # Sites: 2  Muscle 3  Muscle 3: L Trapezius  Muscle 3 - Units: 125  Muscle 3 - # Sites: 2         Assessment:     1. Spasmodic torticollis      Non-EMG-guided injections with Dysport took were given to address his left torticollis and mild left shoulder elevation. The procedure was tolerated well this time.      Plan:     A three month reinjection is recommended.

## 2012-02-07 MED ORDER — clonazePAM (KLONOPIN) 0.5 MG tablet
0.5 | ORAL_TABLET | ORAL | Status: AC
Start: 2012-02-07 — End: 2013-04-16

## 2012-03-30 ENCOUNTER — Ambulatory Visit: Admit: 2012-03-30 | Discharge: 2012-03-30 | Payer: MEDICARE | Attending: Neurology

## 2012-03-30 DIAGNOSIS — G243 Spasmodic torticollis: Secondary | ICD-10-CM

## 2012-03-30 MED ORDER — abobotulinum toxin A (DYSPORT) SolR 1,000 Units
500 | Freq: Once | INTRAMUSCULAR | Status: AC
Start: 2012-03-30 — End: 2012-03-30
  Administered 2012-03-30: 15:00:00 via INTRAMUSCULAR

## 2012-03-30 NOTE — Unmapped (Signed)
Subjective:      Patient ID: Matthew [name] is a 67 y.o. male.    Movement Disorders HPI    Matthew [name] returned for his reinjection session with Dysport to address his left torticollis and mild left shoulder elevation. Response has been excellent. We again did not use EMG guidance into the right sternocleidomastoid, left splenius capitis, and left trapezius (painful area) because of the vasovagal syncope with which a session was complicated. The total dose and distribution was kept the same.         Histories:     He has a past medical history of Environmental allergies; Sleep disorder; and Neck symptom.    He has past surgical history that includes Hernia repair [phone]); Achilles tendon repair (1/04); and Knee arthroscopy (1976).    His family history includes Colon cancer in his mother and Heart failure in his father.    He reports that he has never smoked. He does not have any smokeless tobacco history on file. He reports that  drinks alcohol. He reports that he does not use illicit drugs.    Review of Systems  Refer to HPI for review of systems documentation.    Allergies:   Review of patient's allergies indicates no known allergies.    Medications:     Outpatient Encounter Prescriptions as of 03/30/2012   Medication Sig Dispense Refill   ??? atenolol (TENORMIN) 50 MG tablet        ??? cholecalciferol, vitamin D3, 10,000 unit Cap Take 10,000 Units by mouth daily.       ??? clonazePAM (KLONOPIN) 0.5 MG tablet TAKE ONE TABLET BY MOUTH EVERY DAY  30 tablet  5   ??? doxycycline (VIBRAMYCIN) 50 MG capsule Take 50 mg by mouth.         ??? finasteride (PROSCAR) 5 mg tablet        ??? LUMIGAN 0.01 % Drop        ??? meloxicam (MOBIC) 15 MG tablet Take 15 mg by mouth daily.       ??? ondansetron (ZOFRAN) 8 MG tablet Take 1 tablet (8 mg total) by mouth every 8 hours as needed.  30 tablet  0     No facility-administered encounter medications on file as of 03/30/2012.       Objective:       There were no vitals taken for this  visit.    Botulinum Toxin Injection Summary  Botox  Botulinum toxin used: Dysport  Dilution (units/volume): 500:2  Total units used: 625  Number of units discarded: 375  Muscle 1  Muscle 1: L Splenius capitus  Muscle 1 - Units: 375  Muscle 1 - # Sites: 6  Muscle 2  Muscle 2: R Sternocleidomastoid  Muscle 2 - Units: 125  Muscle 2 - # Sites: 2  Muscle 3  Muscle 3: L Trapezius  Muscle 3 - Units: 125  Muscle 3 - # Sites: 2         Assessment:     1. Spasmodic torticollis      Non-EMG-guided injections with Dysport took were given to address his left torticollis and mild left shoulder elevation. The procedure was tolerated well this time.      Plan:     A three month reinjection is recommended.

## 2012-06-22 ENCOUNTER — Ambulatory Visit: Admit: 2012-06-22 | Discharge: 2012-06-22 | Payer: MEDICARE | Attending: Neurology

## 2012-06-22 DIAGNOSIS — M436 Torticollis: Secondary | ICD-10-CM

## 2012-06-22 MED ORDER — abobotulin toxin A (DYSPORT) injection 600 Units
300 | Freq: Once | INTRAMUSCULAR | Status: AC
Start: 2012-06-22 — End: 2012-06-22
  Administered 2012-06-22: 18:00:00 via INTRAMUSCULAR

## 2012-06-22 MED ORDER — roPINIRole (REQUIP) 0.25 MG tablet
0.25 | ORAL_TABLET | Freq: Every evening | ORAL | 1.00 refills | 30.00000 days | Status: AC
Start: 2012-06-22 — End: 2014-11-02

## 2012-06-22 NOTE — Unmapped (Signed)
Subjective:      Patient ID: Matthew [name] is a 67 y.o. male.    Movement Disorders HPI    Matthew [name] returned for his reinjection session with Dysport to address his left torticollis and mild left shoulder elevation. Response has been excellent. We again did not use EMG guidance into the right sternocleidomastoid, left splenius capitis, and left trapezius (painful area) because of the vasovagal syncope with which a session was complicated. The total dose and distribution was kept the same.         Histories:     He has a past medical history of Environmental allergies; Sleep disorder; and Neck symptom.    He has past surgical history that includes Hernia repair [phone]); Achilles tendon repair (1/04); and Knee arthroscopy (1976).    His family history includes Colon cancer in his mother and Heart failure in his father.    He reports that he has never smoked. He does not have any smokeless tobacco history on file. He reports that  drinks alcohol. He reports that he does not use illicit drugs.    Review of Systems  Refer to HPI for review of systems documentation.    Allergies:   Review of patient's allergies indicates no known allergies.    Medications:     Outpatient Encounter Prescriptions as of 06/22/2012   Medication Sig Dispense Refill   ??? atenolol (TENORMIN) 50 MG tablet        ??? cholecalciferol, vitamin D3, 10,000 unit Cap Take 10,000 Units by mouth daily.       ??? clonazePAM (KLONOPIN) 0.5 MG tablet TAKE ONE TABLET BY MOUTH EVERY DAY  30 tablet  5   ??? doxycycline (VIBRAMYCIN) 50 MG capsule Take 50 mg by mouth.         ??? finasteride (PROSCAR) 5 mg tablet        ??? LUMIGAN 0.01 % Drop        ??? meloxicam (MOBIC) 15 MG tablet Take 15 mg by mouth daily.       ??? ondansetron (ZOFRAN) 8 MG tablet Take 1 tablet (8 mg total) by mouth every 8 hours as needed.  30 tablet  0     No facility-administered encounter medications on file as of 06/22/2012.       Objective:       Blood pressure 130/78, pulse 82,  height 6' (1.829 m), weight 179 lb (81.194 kg).    Botulinum Toxin Injection Summary  Botox  Botulinum toxin used: Dysport  Dilution (units/volume): 500:2  Total units used: 625  Number of units discarded: 375  Muscle 1  Muscle 1: L Splenius capitus  Muscle 1 - Units: 375  Muscle 1 - # Sites: 6  Muscle 2  Muscle 2: R Sternocleidomastoid  Muscle 2 - Units: 125  Muscle 2 - # Sites: 2  Muscle 3  Muscle 3: L Trapezius  Muscle 3 - Units: 125  Muscle 3 - # Sites: 2         Assessment:     1. Spasmodic torticollis      Non-EMG-guided injections with Dysport took were given to address his left torticollis and mild left shoulder elevation. The procedure was tolerated well this time. Restless leg syndrome (RLS) symptoms have worsened.      Plan:     A four month reinjection is recommended this time. A prescription for Requip was given to address the RLS symptoms.

## 2012-10-08 NOTE — Unmapped (Signed)
Wife is calling in regards to rescheduling her husband's appt, she stated the date of the appt on 10/26/12 they will be out of town and made wife aware next avail appt is 11/23/12, she stated that is ridiculous and dr Matthew [name] specifically told them that he can see the pt at any time. Wife wants a call back to have pt seen this week or next

## 2012-10-08 NOTE — Unmapped (Addendum)
Hold for Matthew [name] patient Matthew [name] knows [name]. [facility] and will contact them to reschedule

## 2012-10-09 NOTE — Unmapped (Signed)
Spoke with pt. and told him that Dr. Waunita Schooner will see him to do his injections on 10-17-12 @ 9:30am.

## 2012-10-17 ENCOUNTER — Ambulatory Visit: Admit: 2012-10-17 | Discharge: 2012-10-17 | Payer: MEDICARE | Attending: Neurology

## 2012-10-17 DIAGNOSIS — G243 Spasmodic torticollis: Secondary | ICD-10-CM

## 2012-10-17 MED ORDER — abobotulinum toxin A (DYSPORT) SolR 1,000 Units
500 | Freq: Once | INTRAMUSCULAR | Status: AC
Start: 2012-10-17 — End: 2012-10-17
  Administered 2012-10-17: 14:00:00 via INTRAMUSCULAR

## 2012-10-17 NOTE — Unmapped (Signed)
Subjective:      Patient ID: Matthew [name] is a 67 y.o. male.    Movement Disorders HPI    [name] returned for his reinjection session with Dysport to address his left torticollis and mild left shoulder elevation. Response has been excellent but he has noted pain and stiffness in the lower left back quadrant. We again did not use EMG guidance into the right sternocleidomastoid, left splenius capitis, and left trapezius (painful area) because of the vasovagal syncope with which a session was complicated. The total dose was increased to account for the new area of painful dystonia.       Histories:     He has a past medical history of Environmental allergies; Sleep disorder; and Neck symptom.    He has past surgical history that includes Hernia repair [phone]); Achilles tendon repair (1/04); and Knee arthroscopy (1976).    His family history includes Colon cancer in his mother and Heart failure in his father.    He reports that he has never smoked. He does not have any smokeless tobacco history on file. He reports that  drinks alcohol. He reports that he does not use illicit drugs.    Review of Systems  Refer to HPI for review of systems documentation.    Allergies:   Review of patient's allergies indicates no known allergies.    Medications:     Outpatient Encounter Prescriptions as of 10/17/2012   Medication Sig Dispense Refill   ??? atenolol (TENORMIN) 50 MG tablet        ??? cholecalciferol, vitamin D3, 10,000 unit Cap Take 10,000 Units by mouth daily.       ??? clonazePAM (KLONOPIN) 0.5 MG tablet TAKE ONE TABLET BY MOUTH EVERY DAY  30 tablet  5   ??? doxycycline (VIBRAMYCIN) 50 MG capsule Take 50 mg by mouth.         ??? finasteride (PROSCAR) 5 mg tablet        ??? LUMIGAN 0.01 % Drop        ??? meloxicam (MOBIC) 15 MG tablet Take 15 mg by mouth daily.       ??? ondansetron (ZOFRAN) 8 MG tablet Take 1 tablet (8 mg total) by mouth every 8 hours as needed.  30 tablet  0   ??? roPINIRole (REQUIP) 0.25 MG tablet Take  1 tablet (0.25 mg total) by mouth every evening.  60 tablet  6     No facility-administered encounter medications on file as of 10/17/2012.       Objective:       Blood pressure 130/80, pulse 68, height 6' (1.829 m), weight 183 lb (83.008 kg).    Botulinum Toxin Injection Summary  Botox  Botulinum toxin used: Dysport  Dilution (units/volume): 500:2  Total units used: 937.5  Number of units discarded: 62.5  Muscle 1  Muscle 1: L Splenius capitus  Muscle 1 - Units: 375  Muscle 1 - # Sites: 6  Muscle 2  Muscle 2: R Sternocleidomastoid  Muscle 2 - Units: 125  Muscle 2 - # Sites: 2  Muscle 3  Muscle 3: L Trapezius  Muscle 3 - Units: 125  Muscle 3 - # Sites: 2  Muscle 4  Muscle 4: Other (comments)  Muscle 4 - Units: 312.5 (L Subscapular muscles)  Muscle 4 - # Sites: 4         Assessment:     1. Spasmodic torticollis      Non-EMG-guided injections with Dysport took were  given to address his left torticollis and mild left shoulder elevation. We added left infrascapularis muscles (lower trapezius) given spread of dystonia into that region, likely as part of the same process. The procedure was tolerated well without EMG guidance.    Plan:     A four month reinjection was recommended again this time.

## 2012-10-26 ENCOUNTER — Encounter: Payer: MEDICARE | Attending: Neurology

## 2013-02-22 ENCOUNTER — Ambulatory Visit: Admit: 2013-02-22 | Payer: MEDICARE | Attending: Neurology

## 2013-02-22 DIAGNOSIS — G243 Spasmodic torticollis: Secondary | ICD-10-CM

## 2013-02-22 MED ORDER — abobotulinum toxin A (DYSPORT) SolR 1,000 Units
500 | Freq: Once | INTRAMUSCULAR | Status: AC
Start: 2013-02-22 — End: 2013-02-22
  Administered 2013-02-22: 20:00:00 1000 [IU] via INTRAMUSCULAR

## 2013-02-22 NOTE — Unmapped (Signed)
Subjective:      Patient ID: Matthew [name] is a 68 y.o. male.    Movement Disorders HPI    Mr. Kryder returned for his reinjection session with Dysport to address his left torticollis and mild left shoulder elevation. Response has been excellent but he has noted pain and stiffness in the lower left back quadrant. We again did not use EMG guidance into the right sternocleidomastoid, left splenius capitis, and left trapezius (painful area) because of the vasovagal syncope with which a session was complicated. The total dose was increased to account for the new area of painful dystonia.       Histories:     He has a past medical history of Environmental allergies; Sleep disorder; and Neck symptom.    He has past surgical history that includes Hernia repair [phone]); Achilles tendon repair (1/04); and Knee arthroscopy (1976).    His family history includes Colon cancer in his mother; Heart failure in his father.    He reports that he has never smoked. He does not have any smokeless tobacco history on file. He reports that he drinks alcohol. He reports that he does not use illicit drugs.    Review of Systems  Refer to HPI for review of systems documentation.    Allergies:   Review of patient's allergies indicates no known allergies.    Medications:     Outpatient Encounter Prescriptions as of 02/22/2013   Medication Sig Dispense Refill   ??? atenolol (TENORMIN) 50 MG tablet        ??? cholecalciferol, vitamin D3, 10,000 unit Cap Take 10,000 Units by mouth daily.       ??? clonazePAM (KLONOPIN) 0.5 MG tablet TAKE ONE TABLET BY MOUTH EVERY DAY  30 tablet  5   ??? doxycycline (VIBRAMYCIN) 50 MG capsule Take 50 mg by mouth.         ??? finasteride (PROSCAR) 5 mg tablet        ??? LUMIGAN 0.01 % Drop        ??? meloxicam (MOBIC) 15 MG tablet Take 15 mg by mouth daily.       ??? ondansetron (ZOFRAN) 8 MG tablet Take 1 tablet (8 mg total) by mouth every 8 hours as needed.  30 tablet  0   ??? roPINIRole (REQUIP) 0.25 MG tablet Take  1 tablet (0.25 mg total) by mouth every evening.  60 tablet  6     No facility-administered encounter medications on file as of 02/22/2013.       Objective:       Blood pressure 120/80, pulse 74, height 6' (1.829 m), weight 184 lb (83.462 kg).    Botulinum Toxin Injection Summary  Botox  Botulinum toxin used: Dysport  Dilution (units/volume): 500:2  Total units used: 1000  Number of units discarded: 0  Muscle 1  Muscle 1: L Splenius capitus  Muscle 1 - Units: 375  Muscle 1 - # Sites: 6  Muscle 2  Muscle 2: R Sternocleidomastoid  Muscle 2 - Units: 125  Muscle 2 - # Sites: 2  Muscle 3  Muscle 3: L Trapezius  Muscle 3 - Units: 125  Muscle 3 - # Sites: 2  Muscle 4  Muscle 4:  (L Subscapular muscles)  Muscle 4 - Units: 375  Muscle 4 - # Sites: 4         Assessment:     1. Spasmodic torticollis      Non-EMG-guided injections with Dysport took were given to  address his left torticollis and mild left shoulder elevation. We added left infrascapularis muscles (lower trapezius) given spread of dystonia into that region, likely as part of the same process. The procedure was tolerated well without EMG guidance.    Plan:     A four month reinjection was recommended again this time.

## 2013-04-16 MED ORDER — clonazePAM (KLONOPIN) 0.5 MG tablet
0.5 | ORAL_TABLET | Freq: Every evening | ORAL | Status: AC | PRN
Start: 2013-04-16 — End: 2013-11-12

## 2013-04-16 NOTE — Unmapped (Signed)
Spoke with pt.'s wife and told her that is OK as long as pt. can be here by 3:30pm.

## 2013-04-16 NOTE — Unmapped (Signed)
Faxed Klonopin Rx to [facility] as requested.

## 2013-04-16 NOTE — Unmapped (Signed)
Spoke with patients wife. Patient is scheduled to see MD May 22 nd at 3 pm. Would like to know if patient can come at 330- 345 pm? Please return her call.

## 2013-04-16 NOTE — Unmapped (Signed)
[facility] is faxing Klonopin refill request per pt.  Does not know what fax # they have.  Has 3 half doses left.  Prefers it be sent to [facility], aware it could take 1-2 weeks; states he doesn't always need it daily.

## 2013-04-16 NOTE — Unmapped (Signed)
Klonopin Rx faxed to [facility]

## 2013-05-20 NOTE — Unmapped (Signed)
Need verbal permission to send to his current address [phone]  Ext: [phone] ask for Shanda Bumps

## 2013-05-20 NOTE — Unmapped (Signed)
Returned call and spoke with Shanda Bumps who needed permission to send pt.'s Klonopin Rx to his home address. It had mistakenly been shipped to his vacation address. Permission given.

## 2013-06-21 ENCOUNTER — Ambulatory Visit: Admit: 2013-06-21 | Payer: MEDICARE | Attending: Neurology

## 2013-06-21 DIAGNOSIS — G243 Spasmodic torticollis: Secondary | ICD-10-CM

## 2013-06-21 MED ORDER — abobotulinum toxin A (DYSPORT) SolR 1,000 Units
500 | Freq: Once | INTRAMUSCULAR | Status: AC
Start: 2013-06-21 — End: 2013-06-21
  Administered 2013-06-21: 20:00:00 1000 [IU] via INTRAMUSCULAR

## 2013-06-21 NOTE — Unmapped (Signed)
Spoke with pt. and told him to try and get here as close to 3:00pm as he can, pt. said he would try.

## 2013-06-21 NOTE — Unmapped (Signed)
Subjective:      Patient ID: Matthew [name] is a 68 y.o. male.    Movement Disorders HPI    Matthew [name] returned for his reinjection session with Dysport to address his left torticollis and mild left shoulder elevation. Response has been excellent. We again did not use EMG guidance into the right sternocleidomastoid, left splenius capitis, and left trapezius (painful area) because of the vasovagal syncope with which a session was complicated.      Histories:     He has a past medical history of Environmental allergies; Sleep disorder; and Neck symptom.    He has past surgical history that includes Hernia repair [phone]); Achilles tendon repair (1/04); and Knee arthroscopy (1976).    His family history includes Colon cancer in his mother; Heart failure in his father.    He reports that he has never smoked. He does not have any smokeless tobacco history on file. He reports that he drinks alcohol. He reports that he does not use illicit drugs.    Review of Systems  Refer to HPI for review of systems documentation.    Allergies:   Review of patient's allergies indicates no known allergies.    Medications:     Outpatient Encounter Prescriptions as of 06/21/2013   Medication Sig Dispense Refill   ??? atenolol (TENORMIN) 50 MG tablet        ??? cholecalciferol, vitamin D3, 10,000 unit Cap Take 10,000 Units by mouth daily.       ??? clonazePAM (KLONOPIN) 0.5 MG tablet Take 1 tablet (0.5 mg total) by mouth at bedtime as needed for Anxiety.  30 tablet  5   ??? doxycycline (VIBRAMYCIN) 50 MG capsule Take 50 mg by mouth.         ??? finasteride (PROSCAR) 5 mg tablet        ??? LUMIGAN 0.01 % Drop        ??? meloxicam (MOBIC) 15 MG tablet Take 15 mg by mouth daily.       ??? ondansetron (ZOFRAN) 8 MG tablet Take 1 tablet (8 mg total) by mouth every 8 hours as needed.  30 tablet  0   ??? roPINIRole (REQUIP) 0.25 MG tablet Take 1 tablet (0.25 mg total) by mouth every evening.  60 tablet  6     No facility-administered encounter  medications on file as of 06/21/2013.       Objective:       There were no vitals taken for this visit.    Botulinum Toxin Injection Summary  Botox  Botulinum toxin used: Dysport  Dilution (units/volume): 500:2  Total units used: 1000  Number of units discarded: 0  Muscle 1  Muscle 1: L Splenius capitus  Muscle 1 - Units: 375  Muscle 1 - # Sites: 6  Muscle 2  Muscle 2: R Sternocleidomastoid  Muscle 2 - Units: 125  Muscle 2 - # Sites: 2  Muscle 3  Muscle 3: L Trapezius  Muscle 3 - Units: 125  Muscle 3 - # Sites: 2  Muscle 4  Muscle 4:  (L Subscapular muscles)  Muscle 4 - Units: 375  Muscle 4 - # Sites: 4         Assessment:     1. Spasmodic torticollis      Non-EMG-guided injections with Dysport took were given to address his left torticollis and mild left shoulder elevation. The procedure was tolerated well without EMG guidance.    Plan:     A four  month reinjection was recommended again this time.

## 2013-06-21 NOTE — Unmapped (Signed)
Pt saying appt time was supposed to be changed to 3:30 - scheduler relayed last phone note verbatim that pt needs to be here by 3:30

## 2013-09-13 ENCOUNTER — Ambulatory Visit: Admit: 2013-09-13 | Payer: MEDICARE | Attending: Neurology

## 2013-09-13 DIAGNOSIS — G243 Spasmodic torticollis: Secondary | ICD-10-CM

## 2013-09-13 MED ORDER — abobotulinum toxin A (DYSPORT) SolR 1,000 Units
500 | Freq: Once | INTRAMUSCULAR | Status: AC
Start: 2013-09-13 — End: 2013-09-13
  Administered 2013-09-13: 20:00:00 1000 [IU] via INTRAMUSCULAR

## 2013-09-13 NOTE — Unmapped (Signed)
Subjective:      Patient ID: Matthew [name] is a 68 y.o. male.    Movement Disorders HPI    Matthew [name] returned for his reinjection session with Dysport to address his left torticollis and mild left shoulder elevation. Response has been excellent. We again did not use EMG guidance into the right sternocleidomastoid, left splenius capitis, and left trapezius (painful area) because of the vasovagal syncope with which a session was complicated.      Histories:     He has a past medical history of Environmental allergies; Sleep disorder; and Neck symptom.    He has past surgical history that includes Hernia repair [phone]); Achilles tendon repair (1/04); and Knee arthroscopy (1976).    His family history includes Colon cancer in his mother; Heart failure in his father.    He reports that he has never smoked. He does not have any smokeless tobacco history on file. He reports that he drinks alcohol. He reports that he does not use illicit drugs.    Review of Systems  Refer to HPI for review of systems documentation.    Allergies:   Review of patient's allergies indicates no known allergies.    Medications:     Outpatient Encounter Prescriptions as of 09/13/2013   Medication Sig Dispense Refill   ??? atenolol (TENORMIN) 50 MG tablet        ??? cholecalciferol, vitamin D3, 10,000 unit Cap Take 10,000 Units by mouth daily.       ??? clonazePAM (KLONOPIN) 0.5 MG tablet Take 1 tablet (0.5 mg total) by mouth at bedtime as needed for Anxiety.  30 tablet  5   ??? doxycycline (VIBRAMYCIN) 50 MG capsule Take 50 mg by mouth.         ??? finasteride (PROSCAR) 5 mg tablet        ??? LUMIGAN 0.01 % Drop        ??? meloxicam (MOBIC) 15 MG tablet Take 15 mg by mouth daily.       ??? ondansetron (ZOFRAN) 8 MG tablet Take 1 tablet (8 mg total) by mouth every 8 hours as needed.  30 tablet  0   ??? roPINIRole (REQUIP) 0.25 MG tablet Take 1 tablet (0.25 mg total) by mouth every evening.  60 tablet  6     No facility-administered encounter  medications on file as of 09/13/2013.       Objective:       There were no vitals taken for this visit.    Botulinum Toxin Injection Summary  Botox  Botulinum toxin used: Dysport  Dilution (units/volume): 500:2  Total units used: 1000  Number of units discarded: 0  Muscle 1  Muscle 1: L Splenius capitus  Muscle 1 - Units: 375  Muscle 1 - # Sites: 6  Muscle 2  Muscle 2: R Sternocleidomastoid  Muscle 2 - Units: 125  Muscle 2 - # Sites: 2  Muscle 3  Muscle 3: L Trapezius  Muscle 3 - Units: 125  Muscle 3 - # Sites: 2  Muscle 4  Muscle 4:  (L Subscapular muscles)  Muscle 4 - Units: 375  Muscle 4 - # Sites: 4    We also injected with 0.10 in left and right throat, in an empiric effort to treat the cough.     Assessment:     1. Spasmodic torticollis      Non-EMG-guided injections with Dysport took were given to address his left torticollis and mild left shoulder elevation. The  procedure was tolerated well without EMG guidance.    Plan:     A four month reinjection was recommended again this time.

## 2013-09-27 ENCOUNTER — Encounter: Payer: MEDICARE | Attending: Neurology

## 2013-11-12 MED ORDER — clonazePAM (KLONOPIN) 0.5 MG tablet
0.5 | ORAL_TABLET | Freq: Every evening | ORAL | Status: AC | PRN
Start: 2013-11-12 — End: 2014-02-20

## 2013-11-13 NOTE — Unmapped (Signed)
Returned call, no answer. LVM to call the office.

## 2013-11-13 NOTE — Unmapped (Signed)
Pt called would like to speak with [name]. Pls return his call.

## 2013-11-14 NOTE — Unmapped (Signed)
Returned call, n answer. LVM to call the office.

## 2013-11-14 NOTE — Unmapped (Signed)
Spoke with pt.and gave him and appt. for injections on 02-04-14 @ 3:30pm.        Dear Matthew [name],    No problem. We shall make it work at one of these times. I am copying my medical and academic assistants Matthew [name] and Matthew [name], respectively) so as to figure out a time that we can get this done.    Hope all is well.     Matthew [name]    From: [name]  Date: Tuesday, November 12, 2013 at 2:50 PM  To: [name]  Cc: [name]  Subject: Injections    Dr.,        I will be out of town on 12/18 when my next injections are scheduled. I will be available prior to 12/16 or from 12/29 to about January 13.  Would I be able to get my next injections during one of these times?  Thanks and sorry for the need to change my appointment.  Matthew [name]

## 2014-01-17 ENCOUNTER — Encounter: Payer: MEDICARE | Attending: Neurology

## 2014-02-04 ENCOUNTER — Ambulatory Visit: Admit: 2014-02-04 | Payer: MEDICARE | Attending: Neurology

## 2014-02-04 DIAGNOSIS — M436 Torticollis: Secondary | ICD-10-CM

## 2014-02-04 MED ORDER — nortriptyline (PAMELOR) 10 MG capsule
10 | ORAL_CAPSULE | Freq: Every evening | ORAL | 1.00 refills | 30.00000 days | Status: AC
Start: 2014-02-04 — End: 2014-05-23

## 2014-02-04 MED ORDER — nortriptyline (PAMELOR) 10 MG capsule
10 | ORAL_CAPSULE | Freq: Every evening | ORAL | 1.00 refills | 30.00000 days | Status: AC
Start: 2014-02-04 — End: 2014-02-04

## 2014-02-06 MED ORDER — abobotulinum toxin A (DYSPORT) SolR 1,000 Units
500 | Freq: Once | INTRAMUSCULAR | Status: AC
Start: 2014-02-06 — End: 2014-02-06
  Administered 2014-02-06: 19:00:00 1000 [IU] via INTRAMUSCULAR

## 2014-02-06 NOTE — Unmapped (Deleted)
Subjective:      Patient ID: Matthew [name] is a 69 y.o. male.    HPI   {NEUR HPI [phone]}         Histories:     He has a past medical history of Environmental allergies; Sleep disorder; and Neck symptom.    He has past surgical history that includes Hernia repair [phone]); Achilles tendon repair (1/04); and Knee arthroscopy (1976).    His family history includes Colon cancer in his mother; Heart failure in his father.    He reports that he has never smoked. He does not have any smokeless tobacco history on file. He reports that he drinks alcohol. He reports that he does not use illicit drugs.      Review of Systems    Allergies:   Review of patient's allergies indicates no known allergies.    Medications:     Outpatient Encounter Prescriptions as of 02/04/2014   Medication Sig Dispense Refill   ??? atenolol (TENORMIN) 50 MG tablet      ??? clonazePAM (KLONOPIN) 0.5 MG tablet Take 1 tablet (0.5 mg total) by mouth at bedtime as needed for Anxiety. 30 tablet 5   ??? doxycycline (VIBRAMYCIN) 50 MG capsule Take 50 mg by mouth.       ??? finasteride (PROSCAR) 5 mg tablet      ??? LUMIGAN 0.01 % Drop      ??? roPINIRole (REQUIP) 0.25 MG tablet Take 1 tablet (0.25 mg total) by mouth every evening. 60 tablet 6   ??? [DISCONTINUED] cholecalciferol, vitamin D3, 10,000 unit Cap Take 10,000 Units by mouth daily.     ??? [DISCONTINUED] meloxicam (MOBIC) 15 MG tablet Take 15 mg by mouth daily.     ??? [DISCONTINUED] nortriptyline (PAMELOR) 10 MG capsule Take 1 capsule (10 mg total) by mouth at bedtime. 30 capsule 11   ??? [DISCONTINUED] ondansetron (ZOFRAN) 8 MG tablet Take 1 tablet (8 mg total) by mouth every 8 hours as needed. 30 tablet 0     No facility-administered encounter medications on file as of 02/04/2014.        Objective:       Blood pressure 138/86, pulse 86, height 6' (1.829 m), weight 169 lb (76.658 kg).    Neurologic Exam    Physical Exam    Prior Diagnostic Testing:   {NEUR PRIOR DIAGNOSTIC TESTING:[phone]}          Assessment:     ***    Plan:     ***

## 2014-02-06 NOTE — Unmapped (Signed)
Subjective:      Patient ID: Matthew [name] is a 69 y.o. male.    Movement Disorders HPI    Mr. Hallas returned for his reinjection session with Dysport to address his left torticollis and mild left shoulder elevation. Response has been excellent. We again did not use EMG guidance into the right sternocleidomastoid, left splenius capitis, and left trapezius (painful area) because of the vasovagal syncope with which a former session was complicated.      Histories:     He has a past medical history of Environmental allergies; Sleep disorder; and Neck symptom.    He has past surgical history that includes Hernia repair [phone]); Achilles tendon repair (1/04); and Knee arthroscopy (1976).    His family history includes Colon cancer in his mother; Heart failure in his father.    He reports that he has never smoked. He does not have any smokeless tobacco history on file. He reports that he drinks alcohol. He reports that he does not use illicit drugs.    Review of Systems  Refer to HPI for review of systems documentation.    Allergies:   Review of patient's allergies indicates no known allergies.    Medications:     Outpatient Encounter Prescriptions as of 02/04/2014   Medication Sig Dispense Refill   ??? atenolol (TENORMIN) 50 MG tablet      ??? clonazePAM (KLONOPIN) 0.5 MG tablet Take 1 tablet (0.5 mg total) by mouth at bedtime as needed for Anxiety. 30 tablet 5   ??? doxycycline (VIBRAMYCIN) 50 MG capsule Take 50 mg by mouth.       ??? finasteride (PROSCAR) 5 mg tablet      ??? LUMIGAN 0.01 % Drop      ??? roPINIRole (REQUIP) 0.25 MG tablet Take 1 tablet (0.25 mg total) by mouth every evening. 60 tablet 6   ??? [DISCONTINUED] cholecalciferol, vitamin D3, 10,000 unit Cap Take 10,000 Units by mouth daily.     ??? [DISCONTINUED] meloxicam (MOBIC) 15 MG tablet Take 15 mg by mouth daily.     ??? [DISCONTINUED] nortriptyline (PAMELOR) 10 MG capsule Take 1 capsule (10 mg total) by mouth at bedtime. 30 capsule 11   ??? [DISCONTINUED]  ondansetron (ZOFRAN) 8 MG tablet Take 1 tablet (8 mg total) by mouth every 8 hours as needed. 30 tablet 0     No facility-administered encounter medications on file as of 02/04/2014.       Objective:       Blood pressure 138/86, pulse 86, height 6' (1.829 m), weight 169 lb (76.658 kg).    Botulinum Toxin Injection Summary  Botox  Botulinum toxin used: Dysport  Dilution (units/volume): 500:2  Total units used: 1000  Number of units discarded: 100  Muscle 1  Muscle 1: L Splenius capitus  Muscle 1 - Units: 375  Muscle 1 - # Sites: 6  Muscle 2  Muscle 2: R Sternocleidomastoid  Muscle 2 - Units: 125  Muscle 2 - # Sites: 2  Muscle 3  Muscle 3: L Trapezius  Muscle 3 - Units: 125  Muscle 3 - # Sites: 2  Muscle 4  Muscle 4:  (L Subscapular muscles)  Muscle 4 - Units: 375  Muscle 4 - # Sites: 4    We also injected with 0.10 in left and right throat, in an empiric effort to treat the cough.     Assessment:     1. Spasmodic torticollis      Non-EMG-guided injections with Dysport  took were given to address his left torticollis and mild left shoulder elevation. The procedure was tolerated well without EMG guidance.    Plan:     A four month reinjection was recommended again this time.

## 2014-02-20 MED ORDER — clonazePAM (KLONOPIN) 0.5 MG tablet
0.5 | ORAL_TABLET | Freq: Every evening | ORAL | Status: AC | PRN
Start: 2014-02-20 — End: 2014-04-22

## 2014-02-20 NOTE — Unmapped (Signed)
Spoke with patients wife. They are in [facility]. Pharmacy will not refill Clonazepam d/t it being transferred recently. They are asking for a new script. Please call into [facility] # [phone] Fax # [phone]

## 2014-02-20 NOTE — Unmapped (Signed)
Called Rx into pharm as requested.

## 2014-04-28 MED ORDER — clonazePAM (KLONOPIN) 0.5 MG tablet
0.5 | ORAL_TABLET | ORAL | Status: AC
Start: 2014-04-28 — End: 2014-06-20

## 2014-05-23 ENCOUNTER — Ambulatory Visit: Admit: 2014-05-23 | Payer: MEDICARE | Attending: Neurology

## 2014-05-23 DIAGNOSIS — G243 Spasmodic torticollis: Secondary | ICD-10-CM

## 2014-05-23 MED ORDER — abobotulinum toxin A (DYSPORT) SolR 500 Units
500 | Freq: Once | INTRAMUSCULAR | Status: AC
Start: 2014-05-23 — End: 2014-05-23

## 2014-05-23 MED ORDER — abobotulinum toxin A (DYSPORT) SolR 500 Units
500 | Freq: Once | INTRAMUSCULAR | Status: AC
Start: 2014-05-23 — End: ?

## 2014-05-23 MED ORDER — abobotulinum toxin A (DYSPORT) SolR 1,000 Units
500 | Freq: Once | INTRAMUSCULAR | Status: AC
Start: 2014-05-23 — End: 2014-05-23
  Administered 2014-05-23: 20:00:00 1000 [IU] via INTRAMUSCULAR

## 2014-05-23 MED ORDER — nortriptyline (PAMELOR) 25 MG capsule
25 | ORAL_CAPSULE | Freq: Every evening | ORAL | 1.00 refills | 30.00000 days | Status: AC
Start: 2014-05-23 — End: 2016-01-12

## 2014-05-23 MED ORDER — nortriptyline (PAMELOR) 25 MG capsule
25 | ORAL_CAPSULE | Freq: Every evening | ORAL | 1.00 refills | 30.00000 days | Status: AC
Start: 2014-05-23 — End: 2014-05-23

## 2014-05-23 NOTE — Unmapped (Signed)
Pt calling to have his medication sent to the [facility] pharmacy on file. Pt states [facility] in [place]. Please advise.

## 2014-05-23 NOTE — Unmapped (Signed)
Addended by: Loura Halt on: 05/23/2014 04:46 PM     Modules accepted: Orders

## 2014-05-23 NOTE — Unmapped (Signed)
Addended by: Lorre Nick D on: 05/23/2014 03:51 PM     Modules accepted: Orders

## 2014-05-23 NOTE — Unmapped (Signed)
Subjective:      Patient ID: Matthew [name] is a 69 y.o. male.    Movement Disorders HPI    Matthew [name] returned for his reinjection session with Dysport to address his left torticollis and mild left shoulder elevation. Response has been excellent. At the last visit, we injected 0.1 mL (50 units) of Dysport into his left and right throat with no real improvement in the cough. He does feel some improvement was achieved with the initiation of nortriptyline. We again did not use EMG guidance into the right sternocleidomastoid, left splenius capitis, and left trapezius (painful area) because of the vasovagal syncope which complicated a former injection session.       Histories:     He has a past medical history of Environmental allergies; Sleep disorder; and Neck symptom.    He has past surgical history that includes Hernia repair [phone]); Achilles tendon repair (1/04); and Knee arthroscopy (1976).    His family history includes Colon cancer in his mother; Heart failure in his father.    He reports that he has never smoked. He does not have any smokeless tobacco history on file. He reports that he drinks alcohol. He reports that he does not use illicit drugs.    Review of Systems  Refer to HPI for review of systems documentation.    Allergies:   Review of patient's allergies indicates no known allergies.    Medications:     Outpatient Encounter Prescriptions as of 05/23/2014   Medication Sig Dispense Refill   ??? atenolol (TENORMIN) 50 MG tablet      ??? clonazePAM (KLONOPIN) 0.5 MG tablet TAKE 1 TABLET AT BEDTIME 30 tablet 1   ??? doxycycline (VIBRAMYCIN) 50 MG capsule Take 50 mg by mouth.       ??? finasteride (PROSCAR) 5 mg tablet      ??? LUMIGAN 0.01 % Drop      ??? nortriptyline (PAMELOR) 25 MG capsule Take 1 capsule (25 mg total) by mouth at bedtime. 30 capsule 11   ??? roPINIRole (REQUIP) 0.25 MG tablet Take 1 tablet (0.25 mg total) by mouth every evening. 60 tablet 6   ??? [DISCONTINUED] nortriptyline (PAMELOR) 10  MG capsule Take 1 capsule (10 mg total) by mouth at bedtime. 30 capsule 11     Facility-Administered Encounter Medications as of 05/23/2014   Medication Dose Route Frequency Provider Last Rate Last Dose   ??? abobotulinum toxin A (DYSPORT) SolR 1,000 Units  1,000 Units Intramuscular Once Loura Halt, MD       ??? [DISCONTINUED] abobotulinum toxin A (DYSPORT) SolR 500 Units  500 Units Intramuscular Once Loura Halt, MD           Objective:       Blood pressure 148/84, pulse 57, height 6' (1.829 m), weight 167 lb 6.4 oz (75.932 kg).    Botulinum Toxin Injection Summary  Botox  Botulinum toxin used: Dysport  Dilution (units/volume): 500:2  Total units used: 1000  Number of units discarded: 0  Muscle 1  Muscle 1: L Splenius capitus  Muscle 1 - Units: 375  Muscle 1 - # Sites: 6  Muscle 2  Muscle 2: R Sternocleidomastoid  Muscle 2 - Units: 125  Muscle 2 - # Sites: 2  Muscle 3  Muscle 3: L Trapezius  Muscle 3 - Units: 125  Muscle 3 - # Sites: 2  Muscle 4  Muscle 4:  (L Subscapular muscles)  Muscle 4 - Units: 375  Muscle 4 - #  Sites: 6          Assessment:     1. Spasmodic torticollis         Non-EMG-guided injections with Dysport took were given to address his left torticollis and mild left shoulder elevation. The procedure was tolerated well without EMG guidance.    Plan:     A four month reinjection was recommended again this time.

## 2014-05-26 NOTE — Unmapped (Signed)
Regarding the message below Pt was inquiring about this Clonazepam 0.5mg  tab script be called into preferred pharmacy. I called this Rx in and is now ready for pick up. I tried contacting this Pt and LM on voicemail stating that his Rx is ready for pick up. This message is complete.

## 2014-06-20 NOTE — Unmapped (Signed)
[facility] pharmacy calling to request an early refill for clonazepam, the pt is going out of town tomorrow.  Ph [phone]

## 2014-06-20 NOTE — Unmapped (Signed)
Returned call to pharm and approved early RF of Clonazepam

## 2014-06-21 MED ORDER — clonazePAM (KLONOPIN) 0.5 MG tablet
0.5 | ORAL_TABLET | Freq: Every evening | ORAL | Status: AC | PRN
Start: 2014-06-21 — End: 2014-07-22

## 2014-07-23 MED ORDER — clonazePAM (KLONOPIN) 0.5 MG tablet
0.5 | ORAL_TABLET | ORAL | Status: AC
Start: 2014-07-23 — End: 2014-11-03

## 2014-08-22 ENCOUNTER — Ambulatory Visit: Admit: 2014-08-22 | Payer: MEDICARE | Attending: Neurology

## 2014-08-22 DIAGNOSIS — G243 Spasmodic torticollis: Secondary | ICD-10-CM

## 2014-08-22 MED ORDER — abobotulinum toxin A (DYSPORT) SolR 1,000 Units
500 | Freq: Once | INTRAMUSCULAR | Status: AC
Start: 2014-08-22 — End: 2014-08-22
  Administered 2014-08-22: 20:00:00 1000 [IU] via INTRAMUSCULAR

## 2014-08-22 NOTE — Unmapped (Signed)
Subjective:      Patient ID: Matthew [name] is a 69 y.o. male.    Movement Disorders HPI    Matthew [name] returned for his reinjection session with Dysport to address his left torticollis and mild left shoulder elevation. Response has been excellent. We again did not use EMG guidance into the right sternocleidomastoid, left splenius capitis, and left trapezius (painful area) because of the vasovagal syncope which complicated a former injection session.       Histories:     He has a past medical history of Environmental allergies; Sleep disorder; and Neck symptom.    He has past surgical history that includes Hernia repair [phone]); Achilles tendon repair (1/04); and Knee arthroscopy (1976).    His family history includes Colon cancer in his mother; Heart failure in his father.    He reports that he has never smoked. He does not have any smokeless tobacco history on file. He reports that he drinks alcohol. He reports that he does not use illicit drugs.    Review of Systems  Refer to HPI for review of systems documentation.    Allergies:   Review of patient's allergies indicates no known allergies.    Medications:     Outpatient Encounter Prescriptions as of 08/22/2014   Medication Sig Dispense Refill   ??? atenolol (TENORMIN) 50 MG tablet      ??? clonazePAM (KLONOPIN) 0.5 MG tablet TAKE ONE TABLET BY MOUTH EVERY NIGHT AT BEDTIME 30 tablet 2   ??? doxycycline (VIBRAMYCIN) 50 MG capsule Take 20 mg by mouth.        ??? finasteride (PROSCAR) 5 mg tablet      ??? glucosamine-chondroitin 500-400 mg tablet Take 1 tablet by mouth 3 times a day.     ??? LUMIGAN 0.01 % Drop      ??? nortriptyline (PAMELOR) 25 MG capsule Take 1 capsule (25 mg total) by mouth at bedtime. 30 capsule 11   ??? omega-3 acid ethyl esters (LOVAZA) 1 gram capsule Take 2 g by mouth daily.     ??? roPINIRole (REQUIP) 0.25 MG tablet Take 1 tablet (0.25 mg total) by mouth every evening. 60 tablet 6   ??? thiamine (VITAMIN B-1) 100 MG tablet Take 100 mg by mouth  daily.       Facility-Administered Encounter Medications as of 08/22/2014   Medication Dose Route Frequency Provider Last Rate Last Dose   ??? abobotulinum toxin A (DYSPORT) SolR 500 Units  500 Units Intramuscular Once Loura Halt, MD       ??? abobotulinum toxin A (DYSPORT) SolR 500 Units  500 Units Intramuscular Once Loura Halt, MD           Objective:       Blood pressure 136/87, pulse 84, weight 167 lb (75.751 kg).    Botulinum Toxin Injection Summary  Botox  Botulinum toxin used: Dysport  Dilution (units/volume): 500:2  Total units used: 1000  Number of units discarded: 0  Muscle 1  Muscle 1: L Splenius capitus  Muscle 1 - Units: 375  Muscle 1 - # Sites: 6  Muscle 2  Muscle 2: R Sternocleidomastoid  Muscle 2 - Units: 125  Muscle 2 - # Sites: 2  Muscle 3  Muscle 3: L Trapezius  Muscle 3 - Units: 125  Muscle 3 - # Sites: 2  Muscle 4  Muscle 4:  (L Subscapular muscles)  Muscle 4 - Units: 375  Muscle 4 - # Sites: 6  Assessment:     1. Spasmodic torticollis        Non-EMG-guided injections with Dysport took were given to address his left torticollis and mild left shoulder elevation. The procedure was tolerated well without EMG guidance.    Plan:     A four month reinjection was recommended again this time.

## 2014-11-03 MED ORDER — roPINIRoleREQUIP025MGtablet
0.25 | ORAL_TABLET | ORAL | 1.00 refills | 30.00000 days | Status: AC
Start: 2014-11-03 — End: 2015-05-02

## 2014-11-03 MED ORDER — clonazePAM (KLONOPIN) 0.5 MG tablet
0.5 | ORAL_TABLET | Freq: Every evening | ORAL | Status: AC | PRN
Start: 2014-11-03 — End: 2016-01-12

## 2014-11-03 NOTE — Unmapped (Signed)
Pt wife called to request a 30-day script with 6 mnth refill of clonazePAM (KLONOPIN) 0.5 MG tablet, takes as prescribe, send to [facility] on file. Pt did not take last night, is completely out and need right away

## 2014-11-03 NOTE — Unmapped (Signed)
Rx called into the pharm

## 2015-01-16 ENCOUNTER — Ambulatory Visit: Admit: 2015-01-16 | Payer: MEDICARE | Attending: Neurology

## 2015-01-16 DIAGNOSIS — G243 Spasmodic torticollis: Secondary | ICD-10-CM

## 2015-01-16 MED ORDER — abobotulinum toxin A (DYSPORT) SolR 1,000 Units
500 | Freq: Once | INTRAMUSCULAR | Status: AC
Start: 2015-01-16 — End: 2015-01-16
  Administered 2015-01-16: 19:00:00 1000 [IU] via INTRAMUSCULAR

## 2015-01-16 NOTE — Unmapped (Signed)
Subjective:      Patient ID: Matthew [name] is a 69 y.o. male.    Movement Disorders HPI    Matthew [name] returned for his reinjection session with Dysport to address his left torticollis and mild left shoulder elevation. Response has been excellent. We again did not use EMG guidance into the right sternocleidomastoid, left splenius capitis, and left trapezius (painful area) because of the vasovagal syncope which complicated a former injection session.       Histories:     He has a past medical history of Environmental allergies; Sleep disorder; and Neck symptom.    He has past surgical history that includes Hernia repair [phone]); Achilles tendon repair (1/04); and Knee arthroscopy (1976).    His family history includes Colon cancer in his mother; Heart failure in his father.    He reports that he has never smoked. He does not have any smokeless tobacco history on file. He reports that he drinks alcohol. He reports that he does not use illicit drugs.    Review of Systems  Refer to HPI for review of systems documentation.    Allergies:   Review of patient's allergies indicates no known allergies.    Medications:     Outpatient Encounter Prescriptions as of 01/16/2015   Medication Sig Dispense Refill   ??? atenolol (TENORMIN) 50 MG tablet      ??? clonazePAM (KLONOPIN) 0.5 MG tablet Take 1 tablet (0.5 mg total) by mouth at bedtime as needed for Anxiety. 30 tablet 5   ??? doxycycline (VIBRAMYCIN) 50 MG capsule Take 20 mg by mouth.        ??? finasteride (PROSCAR) 5 mg tablet      ??? glucosamine-chondroitin 500-400 mg tablet Take 1 tablet by mouth 3 times a day.     ??? LUMIGAN 0.01 % Drop      ??? nortriptyline (PAMELOR) 25 MG capsule Take 1 capsule (25 mg total) by mouth at bedtime. 30 capsule 11   ??? omega-3 acid ethyl esters (LOVAZA) 1 gram capsule Take 2 g by mouth daily.     ??? roPINIRole (REQUIP) 0.25 MG tablet TAKE ONE TABLET BY MOUTH EVERY EVENING 90 tablet 1   ??? thiamine (VITAMIN B-1) 100 MG tablet Take 100 mg by  mouth daily.       Facility-Administered Encounter Medications as of 01/16/2015   Medication Dose Route Frequency Provider Last Rate Last Dose   ??? abobotulinum toxin A (DYSPORT) SolR 500 Units  500 Units Intramuscular Once Loura Halt, MD       ??? abobotulinum toxin A (DYSPORT) SolR 500 Units  500 Units Intramuscular Once Loura Halt, MD           Objective:       There were no vitals taken for this visit.    Botulinum Toxin Injection Summary  Botox  Botulinum toxin used: Dysport  Dilution (units/volume): 500:2  Total units used: 1000  Number of units discarded: 0  Muscle 1  Muscle 1: L Splenius capitus  Muscle 1 - Units: 375  Muscle 1 - # Sites: 6  Muscle 2  Muscle 2: R Sternocleidomastoid  Muscle 2 - Units: 125  Muscle 2 - # Sites: 2  Muscle 3  Muscle 3: L Trapezius  Muscle 3 - Units: 125  Muscle 3 - # Sites: 2  Muscle 4  Muscle 4:  (L Subscapular muscles)  Muscle 4 - Units: 375  Muscle 4 - # Sites: 6  Assessment:     1. Spasmodic torticollis       Non-EMG-guided injections with Dysport took were given to address his left torticollis and mild left shoulder elevation. The procedure was tolerated well without EMG guidance.    Plan:     A four month reinjection was recommended again this time.

## 2015-05-05 MED ORDER — roPINIRole (REQUIP) 0.25 MG tablet
0.25 | ORAL_TABLET | ORAL | 1.00 refills | 30.00000 days | Status: AC
Start: 2015-05-05 — End: 2015-05-22

## 2015-05-22 ENCOUNTER — Ambulatory Visit: Admit: 2015-05-22 | Payer: MEDICARE | Attending: Neurology

## 2015-05-22 DIAGNOSIS — G243 Spasmodic torticollis: Secondary | ICD-10-CM

## 2015-05-22 MED ORDER — abobotulinumtoxinADYSPORTSolR1000Units
500 | Freq: Once | INTRAMUSCULAR | Status: AC
Start: 2015-05-22 — End: 2015-05-22
  Administered 2015-05-22: 17:00:00 1000 [IU] via INTRAMUSCULAR

## 2015-05-22 MED ORDER — roPINIRole (REQUIP) 0.25 MG tablet
0.25 | ORAL_TABLET | Freq: Every evening | ORAL | 1.00 refills | 30.00000 days | Status: AC
Start: 2015-05-22 — End: 2015-09-11

## 2015-05-22 NOTE — Unmapped (Signed)
Subjective:      Patient ID: Matthew [name] is a 70 y.o. male.    Movement Disorders HPI    Matthew [name] returned for his reinjection session with Dysport to address his left torticollis and mild left shoulder elevation. Response has been excellent. We again did not use EMG guidance into the right sternocleidomastoid, left splenius capitis, and left trapezius (painful area) because of the vasovagal syncope which complicated a prior injection session.       Histories:     He has a past medical history of Environmental allergies; Sleep disorder; and Neck symptom.    He has past surgical history that includes Hernia repair [phone]); Achilles tendon repair (1/04); and Knee arthroscopy (1976).    His family history includes Colon cancer in his mother; Heart failure in his father.    He reports that he has never smoked. He does not have any smokeless tobacco history on file. He reports that he drinks alcohol. He reports that he does not use illicit drugs.    Review of Systems  Refer to HPI for review of systems documentation.    Allergies:   Review of patient's allergies indicates no known allergies.    Medications:     Outpatient Encounter Prescriptions as of 05/22/2015   Medication Sig Dispense Refill   ??? atenolol (TENORMIN) 50 MG tablet      ??? clonazePAM (KLONOPIN) 0.5 MG tablet Take 1 tablet (0.5 mg total) by mouth at bedtime as needed for Anxiety. 30 tablet 5   ??? doxycycline (VIBRAMYCIN) 50 MG capsule Take 20 mg by mouth.        ??? finasteride (PROSCAR) 5 mg tablet      ??? glucosamine-chondroitin 500-400 mg tablet Take 1 tablet by mouth 3 times a day.     ??? LUMIGAN 0.01 % Drop      ??? nortriptyline (PAMELOR) 25 MG capsule Take 1 capsule (25 mg total) by mouth at bedtime. 30 capsule 11   ??? omega-3 acid ethyl esters (LOVAZA) 1 gram capsule Take 2 g by mouth daily.     ??? roPINIRole (REQUIP) 0.25 MG tablet TAKE ONE TABLET BY MOUTH EVERY EVENING 90 tablet 1   ??? thiamine (VITAMIN B-1) 100 MG tablet Take 100 mg by  mouth daily.       Facility-Administered Encounter Medications as of 05/22/2015   Medication Dose Route Frequency Provider Last Rate Last Dose   ??? abobotulinum toxin A (DYSPORT) SolR 500 Units  500 Units Intramuscular Once Loura Halt, MD       ??? abobotulinum toxin A (DYSPORT) SolR 500 Units  500 Units Intramuscular Once Loura Halt, MD           Objective:       There were no vitals taken for this visit.    Botulinum Toxin Injection Summary  Botox  Botulinum toxin used: Dysport  Dilution (units/volume): 500:2  Total units used: 1000  Number of units discarded: 0  Muscle 1  Muscle 1: L Splenius capitus  Muscle 1 - Units: 375  Muscle 1 - # Sites: 6  Muscle 2  Muscle 2: R Sternocleidomastoid  Muscle 2 - Units: 125  Muscle 2 - # Sites: 2  Muscle 3  Muscle 3: L Trapezius  Muscle 3 - Units: 125  Muscle 3 - # Sites: 2  Muscle 4  Muscle 4:  (L Subscapular muscles)  Muscle 4 - Units: 375  Muscle 4 - # Sites: 6  Assessment:     1. Spasmodic torticollis       Non-EMG-guided injections with Dysport took were given to address his left torticollis and mild left shoulder elevation. The procedure was tolerated well without EMG guidance.     Plan:     A four month reinjection was recommended again this time.

## 2015-09-11 ENCOUNTER — Ambulatory Visit: Admit: 2015-09-11 | Payer: MEDICARE | Attending: Neurology

## 2015-09-11 DIAGNOSIS — G243 Spasmodic torticollis: Secondary | ICD-10-CM

## 2015-09-11 MED ORDER — roPINIRole (REQUIP) 3 MG tablet
3 | ORAL_TABLET | Freq: Every evening | ORAL | 1.00 refills | 30.00000 days | Status: AC
Start: 2015-09-11 — End: 2021-06-25

## 2015-09-11 MED ORDER — abobotulinum toxin A (DYSPORT) SolR 1,000 Units
500 | Freq: Once | INTRAMUSCULAR | Status: AC
Start: 2015-09-11 — End: 2015-09-11
  Administered 2015-09-11: 18:00:00 1000 [IU] via INTRAMUSCULAR

## 2015-09-11 NOTE — Unmapped (Signed)
Subjective:      Patient ID: Matthew [name] is a 70 y.o. male.    Movement Disorders HPI    Mr. Frieden returned for his reinjection session with Dysport to address his left torticollis and mild left shoulder elevation. Response has been excellent. We again did not use EMG guidance into the right sternocleidomastoid, left splenius capitis, and left trapezius (painful area) because of the vasovagal syncope which complicated a prior injection session.       Histories:     He has a past medical history of Environmental allergies; Sleep disorder; and Neck symptom.    He has past surgical history that includes Hernia repair [phone]); Achilles tendon repair (1/04); and Knee arthroscopy (1976).    His family history includes Colon cancer in his mother; Heart failure in his father.    He reports that he has never smoked. He does not have any smokeless tobacco history on file. He reports that he drinks alcohol. He reports that he does not use illicit drugs.    Review of Systems  Refer to HPI for review of systems documentation.    Allergies:   Review of patient's allergies indicates no known allergies.    Medications:     Outpatient Encounter Prescriptions as of 09/11/2015   Medication Sig Dispense Refill   ??? atenolol (TENORMIN) 50 MG tablet      ??? clonazePAM (KLONOPIN) 0.5 MG tablet Take 1 tablet (0.5 mg total) by mouth at bedtime as needed for Anxiety. 30 tablet 5   ??? doxycycline (VIBRAMYCIN) 50 MG capsule Take 20 mg by mouth.        ??? finasteride (PROSCAR) 5 mg tablet      ??? glucosamine-chondroitin 500-400 mg tablet Take 1 tablet by mouth 3 times a day.     ??? LUMIGAN 0.01 % Drop      ??? nortriptyline (PAMELOR) 25 MG capsule Take 1 capsule (25 mg total) by mouth at bedtime. 30 capsule 11   ??? omega-3 acid ethyl esters (LOVAZA) 1 gram capsule Take 2 g by mouth daily.     ??? roPINIRole (REQUIP) 0.25 MG tablet Take 4 tablets (1 mg total) by mouth at bedtime. May increase to 2 mg in one week, if no effect 90 tablet 6    ??? thiamine (VITAMIN B-1) 100 MG tablet Take 100 mg by mouth daily.       Facility-Administered Encounter Medications as of 09/11/2015   Medication Dose Route Frequency Provider Last Rate Last Dose   ??? abobotulinum toxin A (DYSPORT) SolR 500 Units  500 Units Intramuscular Once Loura Halt, MD       ??? abobotulinum toxin A (DYSPORT) SolR 500 Units  500 Units Intramuscular Once Loura Halt, MD           Objective:       Blood pressure 127/72, pulse 60, height 5' 11 (1.803 m), weight 181 lb (82.101 kg).    Botulinum Toxin Injection Summary  Was the time out preformed?: Yes  Botulinum toxin used: Dysport  Dilution (units/volume): 500:2  Total units used: 1000  Number of units discarded: 0     Muscle 1  Muscle 1: L Splenius capitus  Muscle 1 - Units: 375  Muscle 1 - # Sites: 6  Muscle 2  Muscle 2: R Sternocleidomastoid  Muscle 2 - Units: 125  Muscle 2 - # Sites: 2  Muscle 3  Muscle 3: L Trapezius  Muscle 3 - Units: 125  Muscle 3 - # Sites:  2  Muscle 4  Muscle 4:  (L Subscapular muscles)  Muscle 4 - Units: 375  Muscle 4 - # Sites: 6       Assessment:     1. Spasmodic torticollis       Non-EMG-guided injections with Dysport took were given to address his left torticollis and mild left shoulder elevation. The procedure was tolerated well without EMG guidance.     Plan:     A four month reinjection was recommended again this time.

## 2015-10-01 NOTE — Unmapped (Signed)
Great!     [name]: please update Matthew [name]???s pharmacy with Gabapentin 100 mg, 1 tablet three times a day.     Matthew [name]: I look forward to an update on your response to this on the next appointment -or earlier, via email, if you already note a major change.     Thanks,  [name]    From: [name] [email or web address]>  Date: Thursday, October 01, 2015 at 4:38 PM  To: [name] Arvin [email or web address]>, Espay, Alberto (espayaj) [email or web address]>  Subject: RE: Mystery Chronic Cough (Laryngeal Sensory Neuropathy)    Sure, I???m willing to experiment. Thanks for thinking of me.  Matthew [name]     From: Shanon Brow [[email or web address]]   Sent: Thursday, October 01, 2015 4:35 PM  To: Alberto Espay [email or web address]>  Cc: Dionicio Stall [email or web address]>  Subject: Re: Mystery Chronic Cough (Laryngeal Sensory Neuropathy)     I will reply, copying Matthew [name]. He was on Lyrica at one time, but I don't think either of [place] recalls any affect on his cough.      I'll let Matthew [name] reply to you if he wants to try gabapentin.  It might help if you can say your patients' pros and cons to it and what is the typical dose vs the 100 mg for your patient who has had success. Reading the literature isn't the most helpful.      Thank you,  [name]    On Oct 01, 2015, at 4:03 PM, [name] [email or web address]> wrote:  Dear [name],     Another patient with laryngeal sensory neuropathy causing chronic cough came to my office with the anecdote that her cough is 75% better with a low dose of gabapentin (100 mg three times a day). She was thrilled and I immediately thought of Matthew [name]. Is Matthew [name] willing to try this medication? I reviewed that clinical trials have shown a marked benefit of gapapentin for this problem.     Hope all is well,  [name]     On Wed, Nov 27, 2013 at 5:14 PM, [name] Mauzy [email or web address]> wrote:  Dr. Waunita Schooner,      After talking with Matthew [name] and our daughter (pediatric  hospitalist in [place]) and discussing Matthew [name]???s cough for many years, we all feel that the likelihood of his cough being neurological is probable.  I have found various articles and even a YouTube presentation on this subject (see three attachments).     One time when we were brainstorming about this subject, you suggested maybe getting a consult from an ENT in to discuss injecting dysport into the larynx/vocal cords.  The thought made [place] cringe.  We???re not sure if the shots you???ve given Matthew [name] in the front/side of his neck are in the larynx or just in the neck muscles, but he tolerated those.  I???m not sure those shots have made a significant difference to his coughing, but I think they may have helped for a short time.     Our daughter, [name], suggested to me to make an appointment with you to just discuss the whole neuropathy issue as she thinks it???s all related.  For a long time I felt that the back issues were neurological because Matthew [name] has electric-like sensitivity when I touch it.  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  Lauren confirmed what I read in an article that one treatment is a tricyclic, such as amitriptylin, which I???m not so sure about.  However, Lauren then suggested that we could discuss with  you if maybe that could replace one or more of the other medications he???s taking which make him so tired.  Obviously, another treatment is a gabapentin.  Neither Matthew [name] and I really want to use that.  He was on Lyrica for his back pain for awhile and didn???t feel that it helped much if at all.  I sold Lyrica and was also on it and I don???t like the side effects.     The other thing that I discussed with my daughter is that Matthew [name] worries a lot about his overall health with these problems.  He believes that he is not going to live a long life and that he is deteriorating.  I think a contributing factor to that anxiety is that every day he wakes up and feels so  tired.  He has to stay in bed at least 10-12 hours a night to even be able to get up.  It also takes him a very long time to get to sleep, which means he???s going to bed even later.  That means, if he has to get up before about 11:00 a.m. or noon, it???s very difficult.  Lauren believes that if you could reassure Matthew [name] that these problems are neurological and not really affecting his physical functioning, he might feel better.        I???ve already told Matthew [name] that he should write to you and I said that I would write to you if he prefers.  He did not object, so I am going ahead on his behalf.       Shanon Brow  [phone]

## 2015-10-02 MED ORDER — gabapentin (NEURONTIN) 100 MG capsule
100 | ORAL_CAPSULE | Freq: Three times a day (TID) | ORAL | Status: AC
Start: 2015-10-02 — End: 2016-01-12

## 2015-11-09 MED ORDER — roPINIRole (REQUIP) 0.25 MG tablet
0.25 | ORAL_TABLET | ORAL | 0 refills | Status: AC
Start: 2015-11-09 — End: 2016-01-12

## 2015-11-09 NOTE — Telephone Encounter (Signed)
Script routed to md

## 2016-01-12 ENCOUNTER — Ambulatory Visit: Admit: 2016-01-12 | Payer: MEDICARE | Attending: Neurology

## 2016-01-12 DIAGNOSIS — G243 Spasmodic torticollis: Secondary | ICD-10-CM

## 2016-01-12 MED ORDER — abobotulinum toxin A (DYSPORT) SolR 1,000 Units
500 | Freq: Once | INTRAMUSCULAR | Status: AC
Start: 2016-01-12 — End: 2016-01-12
  Administered 2016-01-12: 18:00:00 1000 [IU] via INTRAMUSCULAR

## 2016-01-12 MED ORDER — gabapentin (NEURONTIN) 100 MG capsule
100 | ORAL_CAPSULE | Freq: Three times a day (TID) | ORAL | 5 refills | Status: AC
Start: 2016-01-12 — End: 2016-05-16

## 2016-01-12 NOTE — Unmapped (Signed)
Subjective:      Patient ID: Matthew [name] is a 70 y.o. male.    Movement Disorders HPI    [name] returned for his reinjection session with Dysport to address his left torticollis and mild left shoulder elevation. Response has been excellent. We again did not use EMG guidance into the right sternocleidomastoid, left splenius capitis, and left trapezius (painful area) because of the vasovagal syncope which complicated a prior injection session.    His chronic cough, presumably due to laryngeal sensory neuropathy may have reduced in frequency with gabapentin (100 mg three times a day).        Histories:     He has a past medical history of Environmental allergies; Neck symptom; and Sleep disorder.    He has a past surgical history that includes Hernia repair [id number]); Achilles tendon repair (1/04); and Knee arthroscopy (1976).    His family history includes Colon cancer in his mother; Heart failure in his father.    He reports that he has never smoked. He has never used smokeless tobacco. He reports that he drinks alcohol. He reports that he does not use drugs.    Review of Systems  Refer to HPI for review of systems documentation.    Allergies:   Patient has no known allergies.    Medications:     Outpatient Encounter Prescriptions as of 01/12/2016   Medication Sig Dispense Refill   ??? ALPHA LIPOIC ACID ORAL Take by mouth.     ??? aspirin 81 MG EC tablet Take 81 mg by mouth daily.     ??? CETIRIZINE HCL (ZYRTEC ORAL) Take by mouth.     ??? atenolol (TENORMIN) 50 MG tablet      ??? doxycycline (VIBRAMYCIN) 50 MG capsule Take 20 mg by mouth.        ??? finasteride (PROSCAR) 5 mg tablet      ??? gabapentin (NEURONTIN) 100 MG capsule Take 1 capsule (100 mg total) by mouth 3 times a day. 90 capsule 5   ??? glucosamine-chondroitin 500-400 mg tablet Take 1 tablet by mouth 3 times a day.     ??? LUMIGAN 0.01 % Drop      ??? omega-3 acid ethyl esters (LOVAZA) 1 gram capsule Take 2 g by mouth daily.     ??? roPINIRole (REQUIP) 3  MG tablet Take 1 tablet (3 mg total) by mouth at bedtime. 90 tablet 3   ??? [DISCONTINUED] clonazePAM (KLONOPIN) 0.5 MG tablet Take 1 tablet (0.5 mg total) by mouth at bedtime as needed for Anxiety. 30 tablet 5   ??? [DISCONTINUED] nortriptyline (PAMELOR) 25 MG capsule Take 1 capsule (25 mg total) by mouth at bedtime. 30 capsule 11   ??? [DISCONTINUED] roPINIRole (REQUIP) 0.25 MG tablet TAKE ONE TABLET BY MOUTH EVERY EVENING 90 tablet 0   ??? [DISCONTINUED] thiamine (VITAMIN B-1) 100 MG tablet Take 100 mg by mouth daily.       Facility-Administered Encounter Medications as of 01/12/2016   Medication Dose Route Frequency Provider Last Rate Last Dose   ??? abobotulinum toxin A (DYSPORT) SolR 500 Units  500 Units Intramuscular Once Loura Halt, MD       ??? abobotulinum toxin A (DYSPORT) SolR 500 Units  500 Units Intramuscular Once Loura Halt, MD           Objective:       Blood pressure 120/82, pulse 72, height 5' 11 (1.803 m), weight 182 lb (82.6 kg).    Botulinum Toxin Injection Summary  Was the time out preformed?: Yes  Botulinum toxin used: Dysport  Dilution (units/volume): 500:2  Total units used: 1000  Number of units discarded: 0     Muscle 1  Muscle 1: L Splenius capitus  Muscle 1 - Units: 375  Muscle 1 - # Sites: 6  Muscle 2  Muscle 2: R Sternocleidomastoid  Muscle 2 - Units: 125  Muscle 2 - # Sites: 2  Muscle 3  Muscle 3: L Trapezius  Muscle 3 - Units: 125  Muscle 3 - # Sites: 2  Muscle 4  Muscle 4:  (L Subscapular muscles)  Muscle 4 - Units: 375  Muscle 4 - # Sites: 6       Assessment:     1. Spasmodic torticollis       Non-EMG-guided injections with Dysport took were given to address his left torticollis and mild left shoulder elevation. The procedure was tolerated well without EMG guidance.     Plan:     A four month reinjection was recommended again this time. We will increase gabapentin to 200 mg TID to determine if further benefits can be attained for the chronic cough.

## 2016-05-13 ENCOUNTER — Ambulatory Visit: Admit: 2016-05-13 | Payer: MEDICARE | Attending: Neurology

## 2016-05-13 DIAGNOSIS — G243 Spasmodic torticollis: Secondary | ICD-10-CM

## 2016-05-13 MED ORDER — abobotulinum toxin A (DYSPORT) SolR 1,000 Units
500 | Freq: Once | INTRAMUSCULAR | Status: AC
Start: 2016-05-13 — End: 2016-05-13
  Administered 2016-05-13: 19:00:00 1000 [IU] via INTRAMUSCULAR

## 2016-05-13 NOTE — Unmapped (Signed)
Subjective:      Patient ID: Matthew [name] is a 71 y.o. male.    Movement Disorders HPI    Matthew [name] returned for his reinjection session with Dysport to address his left torticollis and mild left shoulder elevation. Response has been excellent. We again did not use EMG guidance into the right sternocleidomastoid, left splenius capitis, and left trapezius (painful area) because of the vasovagal syncope which complicated a prior injection session.    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 His restless leg syndrome symptoms have worsened, affecting his sleep hygiene.       Histories:     He has a past medical history of Environmental allergies; Neck symptom; and Sleep disorder.    He has a past surgical history that includes Hernia repair [phone]); Achilles tendon repair (1/04); and Knee arthroscopy (1976).    His family history includes Colon Cancer in his mother; Heart failure in his father.    He reports that he has never smoked. He has never used smokeless tobacco. He reports that he drinks alcohol. He reports that he does not use drugs.    Review of Systems  Refer to HPI for review of systems documentation.    Allergies:   Patient has no known allergies.    Medications:     Outpatient Encounter Prescriptions as of 05/13/2016   Medication Sig Dispense Refill   ??? ALPHA LIPOIC ACID ORAL Take by mouth.     ??? aspirin 81 MG EC tablet Take 81 mg by mouth daily.     ??? atenolol (TENORMIN) 50 MG tablet      ??? CETIRIZINE HCL (ZYRTEC ORAL) Take by mouth.     ??? doxycycline (VIBRAMYCIN) 50 MG capsule Take 20 mg by mouth.        ??? finasteride (PROSCAR) 5 mg tablet      ??? gabapentin (NEURONTIN) 100 MG capsule Take 2 capsules (200 mg total) by mouth 3 times a day. 180 capsule 5   ??? glucosamine-chondroitin 500-400 mg tablet Take 1 tablet by mouth 3 times a day.     ??? LUMIGAN 0.01 % Drop      ??? omega-3  acid ethyl esters (LOVAZA) 1 gram capsule Take 2 g by mouth daily.     ??? roPINIRole (REQUIP) 3 MG tablet Take 1 tablet (3 mg total) by mouth at bedtime. 90 tablet 3     Facility-Administered Encounter Medications as of 05/13/2016   Medication Dose Route Frequency Provider Last Rate Last Dose   ??? abobotulinum toxin A (DYSPORT) SolR 500 Units  500 Units Intramuscular Once Loura Halt, MD       ??? abobotulinum toxin A (DYSPORT) SolR 500 Units  500 Units Intramuscular Once Loura Halt, MD           Objective:       Blood pressure 141/75, pulse 64, height 5' 11 (1.803 m), weight 185 lb (83.9 kg).    Botulinum Toxin Injection Summary  Was the time out preformed?: Yes  Botulinum toxin used: Dysport  Dilution (units/volume): 500:2  Total units used: 1000  Number of units discarded: 0     Muscle 1  Muscle 1: L Splenius capitus  Muscle 1 - Units: 375  Muscle 1 - # Sites: 6  Muscle 2  Muscle 2: R Sternocleidomastoid  Muscle 2 - Units: 125  Muscle 2 - #  Sites: 2  Muscle 3  Muscle 3: L Trapezius  Muscle 3 - Units: 125  Muscle 3 - # Sites: 2  Muscle 4  Muscle 4:  (L Subscapular muscles)  Muscle 4 - Units: 375  Muscle 4 - # Sites: 6       Assessment:     1. Spasmodic torticollis       Non-EMG-guided injections with Dysport took were given to address his left torticollis and mild left shoulder elevation. The procedure was tolerated well without EMG guidance.     Plan:     A four month reinjection was recommended again this time. May consider discontinuing gapabentin. If worse, it will be clear that a higher dose may have been needed.

## 2016-05-16 MED ORDER — gabapentin (NEURONTIN) 100 MG capsule
100 | ORAL_CAPSULE | Freq: Three times a day (TID) | ORAL | 5 refills | Status: AC
Start: 2016-05-16 — End: 2017-03-06

## 2016-05-19 NOTE — Unmapped (Signed)
Script called into pharmacy. Call completed

## 2016-06-21 NOTE — Unmapped (Signed)
Printed the Rx and Dr. Waunita Schooner signed, will mail to pt.        Okay, I???ll get [name] to try that.     Thank you for being so responsive.     [name]    On Jun 21, 2016, at 4:59 PM, Espay, [name] (espayaj) @[email or web address]> wrote:  One thing at a time, I would think that next is best to maximize the melatonin.  [name], MD, MSc  Professor of Neurology   [facility]   [employer]     On Jun 21, 2016, at 4:38 PM, Kadrian Partch [email or web address]> wrote:  No, [name] did not go up very high in melatonin.  I???ll encourage him to try it again and to increase the dosage.      His insomnia is terrible, so maybe Requip is one cause.  However, if he goes off of that, should he go back to gabapentin?  Do patients get relief for their PLM and restless legs on it or more so from Requip?      Feeling desperate here,  [name]    On Jun 21, 2016, at 4:31 PM, Espay, [name] (espayaj) @[email or web address]> wrote:  Hi again [name],     Requip can either improve sleep or cause insomnia. Let???s hope he gets the former. I think that is a good option for his restless leg syndrome.    I am not familiar with NIRS at all.       I assume he went up to 10 or 15 mg of melatonin before giving up on it.     All the best,  [name]     From: Shanon Brow [email or web address]>  Date: Tuesday, Jun 21, 2016 at 4:28 PM  To: Espay, Alberto (espayaj) @[email or web address]>  Cc: [name] [email or web address]>  Subject: Re: Prescription     Thank you.        Also, [name] said he read that Requip can cause insomnia.  What do you think?  Of course, if he doesn???t take it, he really moves a lot.        And he also read about NIRS ([facility]) as a treatment.  Any familiarity with that?       Sometimes his insomnia is so bad, he doesn???t get to sleep until 3:00 or even 5:00am.  He???s miserable.  He was taking  melatonin, as you advised, but now he???s experimenting with not taking it.      [name] is in a meeting right now, but asked me to email you.   [name]    On Jun 21, 2016, at 4:23 PM, Espay, [name] (espayaj) @[email or web address]> wrote:  Sure, [name]. Let me loop [name] in for help with this.    All the best,  [name]    On 06/21/16, 3:10 PM, [name] Schleyer [email or web address]> wrote:       Dr. Waunita Schooner,       Can you fill out the attached prescription order for a wrap for Matthew [name] to try for his restless legs and periodic limb movement.  It seems that his insomnia is worse than ever.   He???d like to give this device a try, but one must have a prescription.        Currently, he???s taking Requip 3 mg before bed. He has cut out gabapentin.

## 2016-07-15 NOTE — Telephone Encounter (Signed)
OK, thanks Matthew [name]. Matthew [name] and Matthew [name] are both looped in to help with trying to get Matthew [name] in with Matthew [name] or a close associate.    All the best,  Matthew [name]     From: Matthew [name] [email or web address]>  Date: Friday, July 15, 2016 at 11:57 AM  To: Matthew [name] (espayaj) @[email or web address]>  Cc: Matthew [name] [email or web address]>, Matthew [name] [email or web address]>  Subject: Re: Prescription    Matthew [name] said he doesnt object to going to [facility], whom he saw many years ago, or to someone you recommend who you think may be better for Matthew [name] particular issues.      We will be home on Monday.  It would be helpful if someone could fit Matthew [name] in soon, as each day and night are increasingly dreaded.     Thank you so much for your advice.   Matthew [name]    On Jul 15, 2016, at 8:32 AM, Espay, Matthew [name] (espayaj) @[email or web address]> wrote:  Most sleep disorders are neurological. So, yes, he does have a neurological disorder whose base (including for the daytime RLS) is likely anchored on a sleep problem. If we skim the daytime surface, we may be neglecting a core element of the problem.     But if Matthew [name] would rather not go that route, we may consider other options. For instance, Requip could be replaced with Neupro, a patch of rotigotine, a newer dopamine agonist. By the way, the Neurontin that I suggested for his cough is a good anti-RLS treatment and we could conceivably increase its dose as an easy next step.     Let me know which route Matthew [name] prefers.     Matthew [name]     From: Matthew [name] @[email or web address]>  Date: Friday, July 15, 2016 at 11:04 AM  To: Matthew [name] (espayaj) @[email or web address]>  Cc: Matthew [name] [email or web address]>, Matthew [name] [email or web address]>  Subject: Re: Prescription     Matthew [name] has difficulty going to sleep, but he has restlessness throughout the day now. 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  Matthew [name] is reluctant to go to a sleep disorder clinic, as he will be connected to wires and he wont sleep at all. Honestly, it seems that theres a bigger problem.   Matthew [name]    On Jul 15, 2016, at 7:57 AM, Espay, Matthew [name] (espayaj) @[email or web address]> wrote:  Matthew [name] and Matthew [name],     Sorry to hear of these difficulties. My suggestion would be to have Matthew [name] be seen by a sleep expert given that his symptoms are all likely related to a sleep disorder that may have remained unrecognized. Would it be reasonable? If so, Ill ask Matthew [name] to get Matthew [name] into the sleep disorders clinic soon.     All the best,  Matthew [name]     From: Matthew [name] [email or web address]>  Date: Friday, July 15, 2016 at 10:54 AM  To: Matthew [name] (espayaj) @[email or web address]>  Cc: Matthew [name] [email or web address]>, Matthew [name] [email or web address]>  Subject: Re: Prescription     Dear Dr. Waunita [name],     We are currently in [place] visiting our daughter. She has observed, confirming for [place], that Matthew [name] is much worse during the daytime hours with restlessness  and discomfort.      We would like for Matthew [name] to come in to talk with you specifically about his insomnia and his worsening RLS and body limb movement symptoms which are very adversely affecting his quality of life.  Wed like to discuss if there is any alternative treatment or clinical trial  to consider that does not include adding more dopamine agonist.  Matthew [name] is very reluctant to increase those medicines or the Requip and wonder if theyre no longer effective and may possibly be augmenting his problems.   Matthew [name] and Matthew [name]   [phone] Matthew [name])  [phone] Matthew [name])

## 2016-07-15 NOTE — Telephone Encounter (Signed)
Yes, [name] has initiated proceedings already. The records are shared with Dr. Clearnce Sorrel so it will be fine.    Take care,  [name]    From: Shanon Brow [[email or web address]]   Sent: Friday, July 15, 2016 1:21 PM  To: Loura Halt (espayaj) @[email or web address][email or web address]>  Cc: Donny Pique [email or web address]>; Durward Parcel [email or web address].Roark) [email or web address]>; [facility], Jillian (Jillian.Smith) [email or web address]>  Subject: Re: Prescription    [name] will go to [facility]. Will [name] make the initial call  and try to get him in earlier than we might?  And Im guessing youll send over the history you have on [name].   [name]    On Jul 15, 2016, at 9:58 AM, Espay, [name] (espayaj) @[email or web address]> wrote:  Dereck Leep, I dont know him but I have heard great things about him.     [name]     From: Shanon Brow [email or web address]>  Date: Friday, July 15, 2016 at 12:43 PM  To: Loura Halt (espayaj) @[email or web address] .[email or web address]>  Cc: [name] [email or web address]>, Jerrye Beavers [email or web address]>, [facility], Jillian (Jillian.Smith) [email or web address]>  Subject: Re: Prescription     So, you would recommend [facility] if we hadnt mentioned his name?  Will [name] or Duncan make the initial call to his office?      Thank you so much.   [name]

## 2016-08-26 ENCOUNTER — Encounter: Payer: MEDICARE | Attending: Neurology

## 2016-09-21 NOTE — Unmapped (Signed)
Patients spouse called to change appointment with Espay and to schedule with [facility]. Please advise.

## 2016-09-21 NOTE — Unmapped (Signed)
Returned call to pt.'s wife and rescheduled pt. for 09-30-16 @ 10:00 am.          [name],    My sister from [place] just decided to visit [place] for a few days and will be arriving on Thursday, 8/30 and leaving on Tuesday.   [name] has an appointment on Friday afternoon with Dr. Waunita Schooner for his Dysport injections.     Unfortunately, that conflicts with some family activities we are planning.  Since my sister will only be here for the Labor Day weekend, we don???t have much flexibility.  My son is even taking off work that day.  The last time we saw her was four years ago, when she came in for his wedding.  She???s my son???s godmother and very dear to him and to [place].     Is there any way for [name] to get in earlier in the week or the following week?    Shanon Brow   [phone]

## 2016-09-30 ENCOUNTER — Encounter: Payer: MEDICARE | Attending: Neurology

## 2016-09-30 ENCOUNTER — Ambulatory Visit: Payer: MEDICARE | Attending: Neurology

## 2016-10-18 ENCOUNTER — Ambulatory Visit: Admit: 2016-10-18 | Payer: MEDICARE | Attending: Neurology

## 2016-10-18 DIAGNOSIS — G243 Spasmodic torticollis: Secondary | ICD-10-CM

## 2016-10-18 MED ORDER — abobotulinum toxin A (DYSPORT) SolR 1,000 Units
500 | Freq: Once | INTRAMUSCULAR | Status: AC
Start: 2016-10-18 — End: 2016-10-18
  Administered 2016-10-18: 21:00:00 1000 [IU] via INTRAMUSCULAR

## 2016-10-18 NOTE — Progress Notes (Signed)
Subjective:      Patient ID: Matthew [name] is a 71 y.o. male.    Movement Disorders HPI    Matthew [name] returned for his reinjection session with Dysport to address his left torticollis and mild left shoulder elevation. Response has been excellent. We again did not use EMG guidance into the right sternocleidomastoid, left splenius capitis, and left trapezius (painful area) because of the vasovagal syncope which complicated a prior injection session.    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 His restless leg syndrome symptoms are better controlled with Neupro.       Histories:     He has a past medical history of Environmental allergies; Neck symptom; and Sleep disorder.    He has a past surgical history that includes Hernia repair [phone]); Achilles tendon repair (1/04); and Knee arthroscopy (1976).    His family history includes Colon Cancer in his mother; Heart failure in his father.    He reports that he has never smoked. He has never used smokeless tobacco. He reports that he drinks alcohol. He reports that he does not use drugs.    Review of Systems  Refer to HPI for review of systems documentation.    Allergies:   Patient has no known allergies.    Medications:     Outpatient Encounter Prescriptions as of 10/18/2016   Medication Sig Dispense Refill    ALPHA LIPOIC ACID ORAL Take by mouth.      aspirin 81 MG EC tablet Take 81 mg by mouth daily.      atenolol (TENORMIN) 50 MG tablet       CETIRIZINE HCL (ZYRTEC ORAL) Take by mouth.      doxycycline (VIBRAMYCIN) 50 MG capsule Take 20 mg by mouth.         finasteride (PROSCAR) 5 mg tablet       gabapentin (NEURONTIN) 100 MG capsule Take 2 capsules (200 mg total) by mouth 3 times a day. 180 capsule 5    glucosamine-chondroitin 500-400 mg tablet Take 1 tablet by mouth 3 times a day.      LUMIGAN 0.01 % Drop       omega-3 acid ethyl  esters (LOVAZA) 1 gram capsule Take 2 g by mouth daily.      roPINIRole (REQUIP) 3 MG tablet Take 1 tablet (3 mg total) by mouth at bedtime. 90 tablet 3     Facility-Administered Encounter Medications as of 10/18/2016   Medication Dose Route Frequency Provider Last Rate Last Dose    abobotulinum toxin A (DYSPORT) SolR 500 Units  500 Units Intramuscular Once Loura Halt, MD        abobotulinum toxin A (DYSPORT) SolR 500 Units  500 Units Intramuscular Once Loura Halt, MD           Objective:       Blood pressure 142/80, pulse 68, height 5' 11 (1.803 m), weight 179 lb (81.2 kg).    Botulinum Toxin Injection Summary  Was the time out preformed?: Yes  Botulinum toxin used: Dysport  Dilution (units/volume): 500:2  Total units used: 1000  Number of units discarded: 0     Muscle 1  Muscle 1: L Splenius capitus  Muscle 1 - Units: 375  Muscle 1 - # Sites: 6  Muscle 2  Muscle 2: R Sternocleidomastoid  Muscle 2 - Units: 125  Muscle 2 - # Sites:  2  Muscle 3  Muscle 3: L Trapezius  Muscle 3 - Units: 125  Muscle 3 - # Sites: 2  Muscle 4  Muscle 4:  (L Subscapular muscles)  Muscle 4 - Units: 375  Muscle 4 - # Sites: 6       Assessment:     1. Spasmodic torticollis       Non-EMG-guided injections with Dysport took were given to address his left torticollis and mild left shoulder elevation. The procedure was tolerated well without EMG guidance.     Plan:     A four month reinjection was recommended again this time.

## 2017-01-13 ENCOUNTER — Ambulatory Visit: Admit: 2017-01-13 | Payer: MEDICARE | Attending: Neurology

## 2017-01-13 DIAGNOSIS — G243 Spasmodic torticollis: Secondary | ICD-10-CM

## 2017-01-13 MED ORDER — abobotulinum toxin A (DYSPORT) SolR 1,000 Units
500 | Freq: Once | INTRAMUSCULAR | Status: AC
Start: 2017-01-13 — End: 2017-01-13
  Administered 2017-01-13: 18:00:00 1000 [IU] via INTRAMUSCULAR

## 2017-01-13 NOTE — Unmapped (Signed)
Subjective:      Patient ID: Matthew [name] is a 71 y.o. male.    Movement Disorders HPI    Matthew [name] returned for his reinjection session with Dysport to address his left torticollis and mild left shoulder elevation. Response has been excellent. We again did not use EMG guidance into the right sternocleidomastoid, left splenius capitis, and left trapezius (painful area) because of the vasovagal syncope which complicated a prior injection session.         Histories:     He has a past medical history of Environmental allergies; Neck symptom; and Sleep disorder.    He has a past surgical history that includes Hernia repair [phone]); Achilles tendon repair (1/04); and Knee arthroscopy (1976).    His family history includes Colon Cancer in his mother; Heart failure in his father.    He reports that he has never smoked. He has never used smokeless tobacco. He reports that he drinks alcohol. He reports that he does not use drugs.    Review of Systems  Refer to HPI for review of systems documentation.    Allergies:   Patient has no known allergies.    Medications:     Outpatient Encounter Prescriptions as of 01/13/2017   Medication Sig Dispense Refill   ??? ALPHA LIPOIC ACID ORAL Take by mouth.     ??? aspirin 81 MG EC tablet Take 81 mg by mouth daily.     ??? atenolol (TENORMIN) 50 MG tablet      ??? CETIRIZINE HCL (ZYRTEC ORAL) Take by mouth.     ??? doxycycline (VIBRAMYCIN) 50 MG capsule Take 20 mg by mouth.        ??? finasteride (PROSCAR) 5 mg tablet      ??? gabapentin (NEURONTIN) 100 MG capsule Take 2 capsules (200 mg total) by mouth 3 times a day. 180 capsule 5   ??? glucosamine-chondroitin 500-400 mg tablet Take 1 tablet by mouth 3 times a day.     ??? LUMIGAN 0.01 % Drop      ??? omega-3 acid ethyl esters (LOVAZA) 1 gram capsule Take 2 g by mouth daily.     ??? roPINIRole (REQUIP) 3 MG tablet Take 1 tablet (3 mg total) by mouth at bedtime. 90 tablet 3     Facility-Administered Encounter Medications as of 01/13/2017    Medication Dose Route Frequency Provider Last Rate Last Dose   ??? abobotulinum toxin A (DYSPORT) SolR 500 Units  500 Units Intramuscular Once Loura Halt, MD       ??? abobotulinum toxin A (DYSPORT) SolR 500 Units  500 Units Intramuscular Once Loura Halt, MD           Objective:       Blood pressure 133/73, pulse 76, height 6' (1.829 m), weight 187 lb (84.8 kg).    Botulinum Toxin Injection Summary  Was the time out preformed?: Yes  Botulinum toxin used: Dysport  Dilution (units/volume): 500:2  Total units used: 1000  Number of units discarded: 0     Muscle 1  Muscle 1: L Splenius capitus  Muscle 1 - Units: 375  Muscle 1 - # Sites: 6  Muscle 2  Muscle 2: R Sternocleidomastoid  Muscle 2 - Units: 125  Muscle 2 - # Sites: 2  Muscle 3  Muscle 3: L Trapezius  Muscle 3 - Units: 125  Muscle 3 - # Sites: 2  Muscle 4  Muscle 4:  (L Subscapular muscles)  Muscle 4 - Units: 375  Muscle 4 - # Sites: 6       Assessment:     1. Spasmodic torticollis       Non-EMG-guided injections with Dysport took were given to address his left torticollis and mild left shoulder elevation. The procedure was tolerated well without EMG guidance.     Plan:     A four month reinjection was recommended again this time.

## 2017-03-06 NOTE — Unmapped (Signed)
Will send Rx to the pharm as requested.

## 2017-03-06 NOTE — Unmapped (Signed)
Matthew [name] spouse Matthew [name] requesting a refill for gabapentin (NEURONTIN) 100 MG capsule.    Requesting Rx be sent to [facility] [address] [place] [phone]    Matthew [name] can be reached at [phone]

## 2017-03-07 MED ORDER — gabapentin (NEURONTIN) 100 MG capsule
100 | ORAL_CAPSULE | Freq: Three times a day (TID) | ORAL | 5 refills | Status: AC
Start: 2017-03-07 — End: 2020-10-01

## 2017-03-07 MED ORDER — gabapentin (NEURONTIN) 100 MG capsule
100 | ORAL_CAPSULE | Freq: Three times a day (TID) | ORAL | 5 refills | Status: AC
Start: 2017-03-07 — End: 2017-03-07

## 2017-05-19 ENCOUNTER — Ambulatory Visit: Admit: 2017-05-19 | Payer: MEDICARE | Attending: Neurology

## 2017-05-19 DIAGNOSIS — G243 Spasmodic torticollis: Secondary | ICD-10-CM

## 2017-05-19 MED ORDER — abobotulinum toxin A (DYSPORT) SolR 1,000 Units
500 | Freq: Once | INTRAMUSCULAR | Status: AC
Start: 2017-05-19 — End: 2017-05-19
  Administered 2017-05-19: 17:00:00 1000 [IU] via INTRAMUSCULAR

## 2017-05-19 MED ORDER — sodium chloride 0.9%
INTRAMUSCULAR | Status: AC
Start: 2017-05-19 — End: 2017-05-19

## 2017-05-19 MED ORDER — abobotulinum toxin A (DYSPORT) 500 unit SolR
500 | INTRAMUSCULAR | Status: AC
Start: 2017-05-19 — End: 2017-05-19

## 2017-05-19 MED FILL — DYSPORT 500 UNIT INTRAMUSCULAR SOLUTION: 500 500 unit | INTRAMUSCULAR | Qty: 2

## 2017-05-19 MED FILL — SODIUM CHLORIDE 0.9 % INJECTION SOLUTION: INTRAMUSCULAR | Qty: 10

## 2017-05-19 NOTE — Unmapped (Signed)
Subjective:      Patient ID: Matthew [name] is a 72 y.o. male.    Movement Disorders HPI    [name] returned for his reinjection session with Dysport to address his left torticollis and mild left shoulder elevation. Response has been excellent. We again did not use EMG guidance into the right sternocleidomastoid, left splenius capitis, and left trapezius (painful area) because of the vasovagal syncope which complicated a prior injection session.         Histories:     He has a past medical history of Environmental allergies; Neck symptom; and Sleep disorder.    He has a past surgical history that includes Hernia repair [phone]); Achilles tendon repair (1/04); and Knee arthroscopy (1976).    His family history includes Colon Cancer in his mother; Heart failure in his father.    He reports that he has never smoked. He has never used smokeless tobacco. He reports that he drinks alcohol. He reports that he does not use drugs.    Review of Systems  Refer to HPI for review of systems documentation.    Allergies:   Patient has no known allergies.    Medications:     Outpatient Encounter Prescriptions as of 05/19/2017   Medication Sig Dispense Refill   ??? ALPHA LIPOIC ACID ORAL Take by mouth.     ??? aspirin 81 MG EC tablet Take 81 mg by mouth daily.     ??? atenolol (TENORMIN) 50 MG tablet      ??? CETIRIZINE HCL (ZYRTEC ORAL) Take by mouth.     ??? doxycycline (VIBRAMYCIN) 50 MG capsule Take 20 mg by mouth.        ??? finasteride (PROSCAR) 5 mg tablet      ??? gabapentin (NEURONTIN) 100 MG capsule Take 2 capsules (200 mg total) by mouth 3 times a day. 180 capsule 5   ??? glucosamine-chondroitin 500-400 mg tablet Take 1 tablet by mouth 3 times a day.     ??? LUMIGAN 0.01 % Drop      ??? omega-3 acid ethyl esters (LOVAZA) 1 gram capsule Take 2 g by mouth daily.     ??? roPINIRole (REQUIP) 3 MG tablet Take 1 tablet (3 mg total) by mouth at bedtime. 90 tablet 3     Facility-Administered Encounter Medications as of 05/19/2017    Medication Dose Route Frequency Provider Last Rate Last Dose   ??? abobotulinum toxin A (DYSPORT) 500 unit SolR            ??? abobotulinum toxin A (DYSPORT) SolR 500 Units  500 Units Intramuscular Once Loura Halt, MD       ??? abobotulinum toxin A (DYSPORT) SolR 500 Units  500 Units Intramuscular Once Loura Halt, MD       ??? sodium chloride 0.9%                Objective:       Blood pressure 118/76, pulse 76, height 5' 11 (1.803 m), weight 177 lb (80.3 kg).    Botulinum Toxin Injection Summary  Was the time out preformed?: Yes  Botulinum toxin used: Dysport  Dilution (units/volume): 500:2  Total units used: 1000  Number of units discarded: 0     Muscle 1  Muscle 1: L Splenius capitus  Muscle 1 - Units: 375  Muscle 1 - # Sites: 6  Muscle 2  Muscle 2: R Sternocleidomastoid  Muscle 2 - Units: 125  Muscle 2 - # Sites: 2  Muscle  3  Muscle 3: L Trapezius  Muscle 3 - Units: 125  Muscle 3 - # Sites: 2  Muscle 4  Muscle 4:  (L Subscapular muscles)  Muscle 4 - Units: 375  Muscle 4 - # Sites: 6       Assessment:     1. Spasmodic torticollis       Non-EMG-guided injections with Dysport took were given to address his left torticollis and mild left shoulder elevation. The procedure was tolerated well without EMG guidance.     Plan:     A four month reinjection was recommended again this time.

## 2017-08-18 ENCOUNTER — Ambulatory Visit: Admit: 2017-08-18 | Payer: MEDICARE | Attending: Neurology

## 2017-08-18 DIAGNOSIS — G243 Spasmodic torticollis: Secondary | ICD-10-CM

## 2017-08-18 MED ORDER — abobotulinum toxin A (DYSPORT) SolR 1,000 Units
500 | Freq: Once | INTRAMUSCULAR | Status: AC
Start: 2017-08-18 — End: 2017-08-18
  Administered 2017-08-18: 18:00:00 1000 [IU] via INTRAMUSCULAR

## 2017-08-18 NOTE — Unmapped (Signed)
Subjective:      Patient ID: Matthew [name] is a 72 y.o. male.    Movement Disorders HPI    Matthew [name] returned for his reinjection session with Dysport to address his left torticollis and mild left shoulder elevation. Response has been excellent. We again did not use EMG guidance into the right sternocleidomastoid, left splenius capitis, and left trapezius (painful area) because of the vasovagal syncope which complicated a prior injection session.         Histories:     He has a past medical history of Environmental allergies; Neck symptom; and Sleep disorder.    He has a past surgical history that includes Hernia repair [phone]); Achilles tendon repair (1/04); and Knee arthroscopy (1976).    His family history includes Colon Cancer in his mother; Heart failure in his father.    He reports that he has never smoked. He has never used smokeless tobacco. He reports that he drinks alcohol. He reports that he does not use drugs.    Review of Systems  Refer to HPI for review of systems documentation.    Allergies:   Patient has no known allergies.    Medications:     Outpatient Encounter Prescriptions as of 08/18/2017   Medication Sig Dispense Refill   ??? ALPHA LIPOIC ACID ORAL Take by mouth.     ??? aspirin 81 MG EC tablet Take 81 mg by mouth daily.     ??? atenolol (TENORMIN) 50 MG tablet      ??? CETIRIZINE HCL (ZYRTEC ORAL) Take by mouth.     ??? doxycycline (VIBRAMYCIN) 50 MG capsule Take 20 mg by mouth.        ??? finasteride (PROSCAR) 5 mg tablet      ??? gabapentin (NEURONTIN) 100 MG capsule Take 2 capsules (200 mg total) by mouth 3 times a day. 180 capsule 5   ??? glucosamine-chondroitin 500-400 mg tablet Take 1 tablet by mouth 3 times a day.     ??? LUMIGAN 0.01 % Drop      ??? omega-3 acid ethyl esters (LOVAZA) 1 gram capsule Take 2 g by mouth daily.     ??? roPINIRole (REQUIP) 3 MG tablet Take 1 tablet (3 mg total) by mouth at bedtime. 90 tablet 3     Facility-Administered Encounter Medications as of 08/18/2017    Medication Dose Route Frequency Provider Last Rate Last Dose   ??? abobotulinum toxin A (DYSPORT) SolR 500 Units  500 Units Intramuscular Once Loura Halt, MD       ??? abobotulinum toxin A (DYSPORT) SolR 500 Units  500 Units Intramuscular Once Loura Halt, MD           Objective:       Blood pressure 96/60, pulse 68, height 5' 11 (1.803 m), weight 182 lb (82.6 kg).    Botulinum Toxin Injection Summary  Was the time out preformed?: Yes  Botulinum toxin used: Dysport  Dilution (units/volume): 500:2  Total units used: 1000  Number of units discarded: 0     Muscle 1  Muscle 1: L Splenius capitus  Muscle 1 - Units: 375  Muscle 1 - # Sites: 6  Muscle 2  Muscle 2: R Sternocleidomastoid  Muscle 2 - Units: 125  Muscle 2 - # Sites: 2  Muscle 3  Muscle 3: L Trapezius  Muscle 3 - Units: 125  Muscle 3 - # Sites: 2  Muscle 4  Muscle 4:  (L Subscapular muscles)  Muscle 4 - Units:  375  Muscle 4 - # Sites: 6       Assessment:     1. Spasmodic torticollis       Non-EMG-guided injections with Dysport took were given to address his left torticollis and mild left shoulder elevation. The procedure was tolerated well without EMG guidance.     Plan:     A four month reinjection was recommended again this time.

## 2017-12-18 NOTE — Unmapped (Signed)
Matthew [name] requesting to speak with MA / nurse regarding insurance forms     Patient can be reached at [phone] (home)

## 2017-12-18 NOTE — Telephone Encounter (Signed)
Returned call to pt. and told him told that I cannot release Medical Records from this office, gave him the phone number to contact Medical Records Office.

## 2018-01-16 ENCOUNTER — Ambulatory Visit: Admit: 2018-01-16 | Payer: MEDICARE | Attending: Neurology

## 2018-01-16 DIAGNOSIS — G243 Spasmodic torticollis: Secondary | ICD-10-CM

## 2018-01-16 MED ORDER — abobotulinum toxin A (DYSPORT) 500 unit
500 | INTRAMUSCULAR | Status: AC
Start: 2018-01-16 — End: 2018-01-16
  Administered 2018-01-16: 16:00:00 1000

## 2018-01-16 MED ORDER — abobotulinum toxin A (DYSPORT) 1,000 Units
500 | Freq: Once | INTRAMUSCULAR | Status: AC
Start: 2018-01-16 — End: 2018-01-17

## 2018-01-16 MED FILL — DYSPORT 500 UNIT INTRAMUSCULAR SOLUTION: 500 500 unit | INTRAMUSCULAR | Qty: 2

## 2018-01-16 NOTE — Unmapped (Signed)
Subjective:      Patient ID: Matthew [name] is a 72 y.o. male.    Movement Disorders HPI    Matthew [name] returned for his reinjection session with Dysport to address his left torticollis and mild left shoulder elevation. Response has been excellent. We again did not use EMG guidance into the right sternocleidomastoid, left splenius capitis, and left trapezius (painful area) because of the vasovagal syncope which complicated a prior injection session.         Histories:     He has a past medical history of Environmental allergies, Neck symptom, and Sleep disorder.    He has a past surgical history that includes Hernia repair [phone]); Achilles tendon repair (1/04); and Knee arthroscopy (1976).    His family history includes Colon Cancer in his mother; Heart failure in his father.    He reports that he has never smoked. He has never used smokeless tobacco. He reports current alcohol use. He reports that he does not use drugs.    Review of Systems  Refer to HPI for review of systems documentation.    Allergies:   Patient has no known allergies.    Medications:     Outpatient Encounter Medications as of 01/16/2018   Medication Sig Dispense Refill   ??? ALPHA LIPOIC ACID ORAL Take by mouth.     ??? aspirin 81 MG EC tablet Take 81 mg by mouth daily.     ??? atenolol (TENORMIN) 50 MG tablet      ??? CETIRIZINE HCL (ZYRTEC ORAL) Take by mouth.     ??? doxycycline (VIBRAMYCIN) 50 MG capsule Take 20 mg by mouth.        ??? finasteride (PROSCAR) 5 mg tablet      ??? gabapentin (NEURONTIN) 100 MG capsule Take 2 capsules (200 mg total) by mouth 3 times a day. 180 capsule 5   ??? glucosamine-chondroitin 500-400 mg tablet Take 1 tablet by mouth 3 times a day.     ??? LUMIGAN 0.01 % Drop      ??? omega-3 acid ethyl esters (LOVAZA) 1 gram capsule Take 2 g by mouth daily.     ??? roPINIRole (REQUIP) 3 MG tablet Take 1 tablet (3 mg total) by mouth at bedtime. 90 tablet 3     Facility-Administered Encounter Medications as of 01/16/2018    Medication Dose Route Frequency Provider Last Rate Last Dose   ??? abobotulinum toxin A (DYSPORT) 500 unit            ??? abobotulinum toxin A (DYSPORT) SolR 500 Units  500 Units Intramuscular Once Loura Halt, MD       ??? abobotulinum toxin A (DYSPORT) SolR 500 Units  500 Units Intramuscular Once Loura Halt, MD           Objective:       Blood pressure 116/70, pulse 60, height 5' 11 (1.803 m), weight 190 lb (86.2 kg).    Botulinum Toxin Injection Summary  Was the time out preformed?: Yes  Botulinum toxin used: Dysport  Dilution (units/volume): 500:2  Total units used: 1000  Number of units discarded: 0     Muscle 1  Muscle 1: L Splenius capitus  Muscle 1 - Units: 375  Muscle 1 - # Sites: 6  Muscle 2  Muscle 2: R Sternocleidomastoid  Muscle 2 - Units: 125  Muscle 2 - # Sites: 2  Muscle 3  Muscle 3: L Trapezius  Muscle 3 - Units: 125  Muscle 3 - #  Sites: 2  Muscle 4  Muscle 4: (L Subscapular muscles)  Muscle 4 - Units: 375  Muscle 4 - # Sites: 6       Assessment:     1. Spasmodic torticollis       Non-EMG-guided injections with Dysport took were given to address his left torticollis and mild left shoulder elevation. The procedure was tolerated well without EMG guidance.     Plan:     A four month reinjection was recommended again this time.

## 2018-06-22 ENCOUNTER — Ambulatory Visit: Admit: 2018-06-22 | Payer: MEDICARE | Attending: Neurology

## 2018-06-22 DIAGNOSIS — G243 Spasmodic torticollis: Secondary | ICD-10-CM

## 2018-06-22 MED ORDER — abobotulinum toxin A (DYSPORT) 1,000 Units
500 | Freq: Once | INTRAMUSCULAR | Status: AC
Start: 2018-06-22 — End: 2018-06-22
  Administered 2018-06-22: 18:00:00 1000 [IU] via INTRAMUSCULAR

## 2018-06-22 MED ORDER — abobotulinum toxin A (DYSPORT) 500 unit
500 | INTRAMUSCULAR | Status: AC
Start: 2018-06-22 — End: 2018-06-22

## 2018-06-22 MED FILL — DYSPORT 500 UNIT INTRAMUSCULAR SOLUTION: 500 500 unit | INTRAMUSCULAR | Qty: 2

## 2018-06-22 NOTE — Unmapped (Signed)
Subjective:      Patient ID: Matthew [name] is a 73 y.o. male.    Movement Disorders HPI    Mr. Poitra returned for his reinjection session with Dysport to address his left torticollis and mild left shoulder elevation. Response has been excellent. We again did not use EMG guidance into the right sternocleidomastoid, left splenius capitis, and left trapezius (painful area) because of the vasovagal syncope which complicated a prior injection session.         Histories:     He has a past medical history of Environmental allergies, Neck symptom, and Sleep disorder.    He has a past surgical history that includes Hernia repair [phone]); Achilles tendon repair (1/04); and Knee arthroscopy (1976).    His family history includes Colon Cancer in his mother; Heart failure in his father.    He reports that he has never smoked. He has never used smokeless tobacco. He reports current alcohol use. He reports that he does not use drugs.    Review of Systems  Refer to HPI for review of systems documentation.    Allergies:   Patient has no known allergies.    Medications:     Outpatient Encounter Medications as of 06/22/2018   Medication Sig Dispense Refill   ??? ALPHA LIPOIC ACID ORAL Take by mouth.     ??? aspirin 81 MG EC tablet Take 81 mg by mouth daily.     ??? atenolol (TENORMIN) 50 MG tablet      ??? CETIRIZINE HCL (ZYRTEC ORAL) Take by mouth.     ??? doxycycline (VIBRAMYCIN) 50 MG capsule Take 20 mg by mouth.        ??? finasteride (PROSCAR) 5 mg tablet      ??? gabapentin (NEURONTIN) 100 MG capsule Take 2 capsules (200 mg total) by mouth 3 times a day. 180 capsule 5   ??? glucosamine-chondroitin 500-400 mg tablet Take 1 tablet by mouth 3 times a day.     ??? LUMIGAN 0.01 % Drop      ??? omega-3 acid ethyl esters (LOVAZA) 1 gram capsule Take 2 g by mouth daily.     ??? roPINIRole (REQUIP) 3 MG tablet Take 1 tablet (3 mg total) by mouth at bedtime. 90 tablet 3     Facility-Administered Encounter Medications as of 06/22/2018    Medication Dose Route Frequency Provider Last Rate Last Dose   ??? abobotulinum toxin A (DYSPORT) 500 unit            ??? abobotulinum toxin A (DYSPORT) SolR 500 Units  500 Units Intramuscular Once Loura Halt, MD       ??? abobotulinum toxin A (DYSPORT) SolR 500 Units  500 Units Intramuscular Once Loura Halt, MD           Objective:       There were no vitals taken for this visit.    Botulinum Toxin Injection Summary  Was the time out preformed?: Yes  Botulinum toxin used: Dysport  Dilution (units/volume): 500:2  Total units used: 1000  Number of units discarded: 0     Muscle 1  Muscle 1: L Splenius capitus  Muscle 1 - Units: 375  Muscle 1 - # Sites: 6  Muscle 2  Muscle 2: R Sternocleidomastoid  Muscle 2 - Units: 125  Muscle 2 - # Sites: 2  Muscle 3  Muscle 3: L Trapezius  Muscle 3 - Units: 125  Muscle 3 - # Sites: 2  Muscle 4  Muscle  4: (L Subscapular muscles)  Muscle 4 - Units: 375  Muscle 4 - # Sites: 6       Assessment:     1. Spasmodic torticollis       Non-EMG-guided injections with Dysport took were given to address his left torticollis and mild left shoulder elevation. The procedure was tolerated well without EMG guidance.     Plan:     A four month reinjection was recommended again this time.

## 2018-09-28 ENCOUNTER — Ambulatory Visit: Admit: 2018-09-28 | Payer: MEDICARE | Attending: Neurology

## 2018-09-28 DIAGNOSIS — G243 Spasmodic torticollis: Secondary | ICD-10-CM

## 2018-09-28 MED ORDER — abobotulinum toxin A (DYSPORT) 500 unit SolR
500 | INTRAMUSCULAR | Status: AC
Start: 2018-09-28 — End: 2018-09-28

## 2018-09-28 MED ORDER — sodium chloride 0.9%
INTRAMUSCULAR | Status: AC
Start: 2018-09-28 — End: 2018-09-28

## 2018-09-28 MED ORDER — abobotulinum toxin A (DYSPORT) SolR 1,000 Units
500 | Freq: Once | INTRAMUSCULAR | Status: AC
Start: 2018-09-28 — End: 2018-09-28
  Administered 2018-09-28: 18:00:00 1000 [IU] via INTRAMUSCULAR

## 2018-09-28 MED FILL — DYSPORT 500 UNIT INTRAMUSCULAR SOLUTION: 500 500 unit | INTRAMUSCULAR | Qty: 2

## 2018-09-28 MED FILL — SODIUM CHLORIDE 0.9 % INJECTION SOLUTION: INTRAMUSCULAR | Qty: 10

## 2018-09-28 NOTE — Unmapped (Signed)
Subjective:      Patient ID: Matthew [name] is a 73 y.o. male.    Movement Disorders HPI    [name] returned for his reinjection session with Dysport to address his left torticollis and mild left shoulder elevation. Response has been excellent. We again did not use EMG guidance into the right sternocleidomastoid, left splenius capitis, and left trapezius (painful area) because of the vasovagal syncope which complicated a prior injection session.         Histories:     He has a past medical history of Environmental allergies, Neck symptom, and Sleep disorder.    He has a past surgical history that includes Hernia repair [phone]); Achilles tendon repair (1/04); and Knee arthroscopy (1976).    His family history includes Colon Cancer in his mother; Heart failure in his father.    He reports that he has never smoked. He has never used smokeless tobacco. He reports current alcohol use. He reports that he does not use drugs.    Review of Systems  Refer to HPI for review of systems documentation.    Allergies:   Patient has no known allergies.    Medications:     Outpatient Encounter Medications as of 09/28/2018   Medication Sig Dispense Refill   ??? ALPHA LIPOIC ACID ORAL Take by mouth.     ??? aspirin 81 MG EC tablet Take 81 mg by mouth daily.     ??? atenolol (TENORMIN) 50 MG tablet      ??? CETIRIZINE HCL (ZYRTEC ORAL) Take by mouth.     ??? doxycycline (VIBRAMYCIN) 50 MG capsule Take 20 mg by mouth.        ??? finasteride (PROSCAR) 5 mg tablet      ??? gabapentin (NEURONTIN) 100 MG capsule Take 2 capsules (200 mg total) by mouth 3 times a day. 180 capsule 5   ??? glucosamine-chondroitin 500-400 mg tablet Take 1 tablet by mouth 3 times a day.     ??? LUMIGAN 0.01 % Drop      ??? omega-3 acid ethyl esters (LOVAZA) 1 gram capsule Take 2 g by mouth daily.     ??? roPINIRole (REQUIP) 3 MG tablet Take 1 tablet (3 mg total) by mouth at bedtime. 90 tablet 3     Facility-Administered Encounter Medications as of 09/28/2018    Medication Dose Route Frequency Provider Last Rate Last Dose   ??? abobotulinum toxin A (DYSPORT) SolR 500 Units  500 Units Intramuscular Once Loura Halt, MD       ??? abobotulinum toxin A (DYSPORT) SolR 500 Units  500 Units Intramuscular Once Loura Halt, MD           Objective:       There were no vitals taken for this visit.    Botulinum Toxin Injection Summary  Was the time out preformed?: Yes  Botulinum toxin used: Dysport  Dilution (units/volume): 500:2  Total units used: 1000  Number of units discarded: 0     Muscle 1  Muscle 1: L Splenius capitus  Muscle 1 - Units: 375  Muscle 1 - # Sites: 6  Muscle 2  Muscle 2: R Sternocleidomastoid  Muscle 2 - Units: 125  Muscle 2 - # Sites: 2  Muscle 3  Muscle 3: L Trapezius  Muscle 3 - Units: 125  Muscle 3 - # Sites: 2  Muscle 4  Muscle 4: (L Subscapular muscles)  Muscle 4 - Units: 375  Muscle 4 - # Sites: 6  Assessment:     1. Spasmodic torticollis       Non-EMG-guided injections with Dysport took were given to address his left torticollis and mild left shoulder elevation. The procedure was tolerated well without EMG guidance.     Plan:     A four month reinjection was recommended again this time.

## 2019-01-18 ENCOUNTER — Ambulatory Visit: Admit: 2019-01-18 | Payer: MEDICARE | Attending: Neurology

## 2019-01-18 DIAGNOSIS — G243 Spasmodic torticollis: Secondary | ICD-10-CM

## 2019-01-18 MED ORDER — sodium chloride 0.9%
INTRAMUSCULAR | Status: AC
Start: 2019-01-18 — End: 2019-01-18

## 2019-01-18 MED ORDER — abobotulinum toxin A (DYSPORT) 500 unit SolR
500 | INTRAMUSCULAR | Status: AC
Start: 2019-01-18 — End: 2019-01-18

## 2019-01-18 MED ORDER — abobotulinum toxin A (DYSPORT) SolR 1,000 Units
500 | Freq: Once | INTRAMUSCULAR | Status: AC
Start: 2019-01-18 — End: 2019-01-18
  Administered 2019-01-18: 18:00:00 1000 [IU] via INTRAMUSCULAR

## 2019-01-18 MED FILL — SODIUM CHLORIDE 0.9 % INJECTION SOLUTION: INTRAMUSCULAR | Qty: 10

## 2019-01-18 MED FILL — DYSPORT 500 UNIT INTRAMUSCULAR SOLUTION: 500 500 unit | INTRAMUSCULAR | Qty: 2

## 2019-01-18 NOTE — Unmapped (Signed)
Subjective:      Patient ID: Matthew [name] is a 73 y.o. male.    Movement Disorders HPI    Mr. Hottinger returned for his reinjection session with Dysport to address his left torticollis and mild left shoulder elevation. Response has been excellent. We again did not use EMG guidance into the right sternocleidomastoid, left splenius capitis, and left trapezius (painful area) because of the vasovagal syncope which complicated a prior injection session.         Histories:     He has a past medical history of Environmental allergies, Neck symptom, and Sleep disorder.    He has a past surgical history that includes Hernia repair [phone]); Achilles tendon repair (1/04); and Knee arthroscopy (1976).    His family history includes Colon Cancer in his mother; Heart failure in his father.    He reports that he has never smoked. He has never used smokeless tobacco. He reports current alcohol use. He reports that he does not use drugs.    Review of Systems  Refer to HPI for review of systems documentation.    Allergies:   Patient has no known allergies.    Medications:     Outpatient Encounter Medications as of 01/18/2019   Medication Sig Dispense Refill   ??? ALPHA LIPOIC ACID ORAL Take by mouth.     ??? aspirin 81 MG EC tablet Take 81 mg by mouth daily.     ??? atenolol (TENORMIN) 50 MG tablet      ??? CETIRIZINE HCL (ZYRTEC ORAL) Take by mouth.     ??? doxycycline (VIBRAMYCIN) 50 MG capsule Take 20 mg by mouth.        ??? finasteride (PROSCAR) 5 mg tablet      ??? gabapentin (NEURONTIN) 100 MG capsule Take 2 capsules (200 mg total) by mouth 3 times a day. 180 capsule 5   ??? glucosamine-chondroitin 500-400 mg tablet Take 1 tablet by mouth 3 times a day.     ??? LUMIGAN 0.01 % Drop      ??? omega-3 acid ethyl esters (LOVAZA) 1 gram capsule Take 2 g by mouth daily.     ??? roPINIRole (REQUIP) 3 MG tablet Take 1 tablet (3 mg total) by mouth at bedtime. 90 tablet 3     Facility-Administered Encounter Medications as of 01/18/2019    Medication Dose Route Frequency Provider Last Rate Last Admin   ??? abobotulinum toxin A (DYSPORT) 500 unit SolR            ??? sodium chloride 0.9%            ??? abobotulinum toxin A (DYSPORT) SolR 500 Units  500 Units Intramuscular Once Loura Halt, MD       ??? abobotulinum toxin A (DYSPORT) SolR 500 Units  500 Units Intramuscular Once Loura Halt, MD           Objective:       There were no vitals taken for this visit.    Botulinum Toxin Injection Summary  Was the time out preformed?: Yes  Botulinum toxin used: Dysport  Dilution (units/volume): 500:2  Total units used: 1000  Number of units discarded: 0     Muscle 1  Muscle 1: L Splenius capitus  Muscle 1 - Units: 375  Muscle 1 - # Sites: 6  Muscle 2  Muscle 2: R Sternocleidomastoid  Muscle 2 - Units: 125  Muscle 2 - # Sites: 2  Muscle 3  Muscle 3: L Trapezius  Muscle  3 - Units: 125  Muscle 3 - # Sites: 2  Muscle 4  Muscle 4: (L Subscapular muscles)  Muscle 4 - Units: 375  Muscle 4 - # Sites: 6       Assessment:     1. Spasmodic torticollis       Non-EMG-guided injections with Dysport took were given to address his left torticollis and mild left shoulder elevation. The procedure was tolerated well without EMG guidance.     Plan:     A four month reinjection was recommended again this time.

## 2019-06-28 ENCOUNTER — Ambulatory Visit: Admit: 2019-06-28 | Payer: MEDICARE | Attending: Neurology

## 2019-06-28 DIAGNOSIS — G243 Spasmodic torticollis: Secondary | ICD-10-CM

## 2019-06-28 MED ORDER — abobotulinum toxin A (DYSPORT) 500 unit SolR
500 | INTRAMUSCULAR | Status: AC
Start: 2019-06-28 — End: 2019-06-28

## 2019-06-28 MED ORDER — sodium chloride 0.9%
INTRAMUSCULAR | Status: AC
Start: 2019-06-28 — End: 2019-06-28

## 2019-06-28 MED ORDER — abobotulinum toxin A (DYSPORT) SolR 1,000 Units
500 | Freq: Once | INTRAMUSCULAR | Status: AC
Start: 2019-06-28 — End: 2019-06-28
  Administered 2019-06-28: 19:00:00 1000 [IU] via INTRAMUSCULAR

## 2019-06-28 MED FILL — DYSPORT 500 UNIT INTRAMUSCULAR SOLUTION: 500 500 unit | INTRAMUSCULAR | Qty: 2

## 2019-06-28 MED FILL — SODIUM CHLORIDE 0.9 % INJECTION SOLUTION: INTRAMUSCULAR | Qty: 10

## 2019-06-28 NOTE — Unmapped (Addendum)
Matthew [name], who has been participating in the [employer] study, was seen today to discuss his potential participation in the [facility] Research Study, a newer and rollover protocol from the [employer]. Today, he came before his clinical appointment with Dr. Waunita Schooner to discuss his participation. Mr. Borawski and Clinical Research Fellow Ailene Ravel reviewed the most recent approved consent form. He was given ample time to review the consent form and ask questions. When he felt all his inquiries were adequately answered, he verbalized he would like to participate in the study and signed the consent form. A paper copy of the consent was provided to Mr. Jeziorski.     After the consent was signed, IRB-approved study-related activities were performed without incident. He was encouraged to call or email [place] if any questions arise regarding his participation in the study.     Ailene Ravel, MD  Clinical Research Fellow  Department of Neurology - Movement Disorders  Emai: [email or web address]

## 2019-06-28 NOTE — Unmapped (Signed)
Subjective:      Patient ID: Matthew [name] is a 74 y.o. male.    Movement Disorders HPI    Mr. Dealmeida returned for his reinjection session with Dysport to address his left torticollis and mild left shoulder elevation. Response has been excellent. We again did not use EMG guidance into the right sternocleidomastoid, left splenius capitis, and left trapezius (painful area) because of the vasovagal syncope which complicated a prior injection session.         Histories:     He has a past medical history of Environmental allergies, Neck symptom, and Sleep disorder.    He has a past surgical history that includes Hernia repair [phone]); Achilles tendon repair (1/04); and Knee arthroscopy (1976).    His family history includes Colon Cancer in his mother; Heart failure in his father.    He reports that he has never smoked. He has never used smokeless tobacco. He reports current alcohol use. He reports that he does not use drugs.    Review of Systems  Refer to HPI for review of systems documentation.    Allergies:   Patient has no known allergies.    Medications:     Outpatient Encounter Medications as of 06/28/2019   Medication Sig Dispense Refill   ??? ALPHA LIPOIC ACID ORAL Take by mouth.     ??? aspirin 81 MG EC tablet Take 81 mg by mouth daily.     ??? atenolol (TENORMIN) 50 MG tablet      ??? CETIRIZINE HCL (ZYRTEC ORAL) Take by mouth.     ??? doxycycline (VIBRAMYCIN) 50 MG capsule Take 20 mg by mouth.        ??? finasteride (PROSCAR) 5 mg tablet      ??? gabapentin (NEURONTIN) 100 MG capsule Take 2 capsules (200 mg total) by mouth 3 times a day. 180 capsule 5   ??? glucosamine-chondroitin 500-400 mg tablet Take 1 tablet by mouth 3 times a day.     ??? LUMIGAN 0.01 % Drop      ??? omega-3 acid ethyl esters (LOVAZA) 1 gram capsule Take 2 g by mouth daily.     ??? roPINIRole (REQUIP) 3 MG tablet Take 1 tablet (3 mg total) by mouth at bedtime. 90 tablet 3     Facility-Administered Encounter Medications as of 06/28/2019    Medication Dose Route Frequency Provider Last Rate Last Admin   ??? sodium chloride 0.9%            ??? abobotulinum toxin A (DYSPORT) SolR 500 Units  500 Units Intramuscular Once Loura Halt, MD       ??? abobotulinum toxin A (DYSPORT) SolR 500 Units  500 Units Intramuscular Once Loura Halt, MD           Objective:       Blood pressure 165/83, pulse 64, height 5' 11 (1.803 m), weight 181 lb (82.1 kg), SpO2 99 %.    Botulinum Toxin Injection Summary  Was the time out preformed?: Yes  Botulinum toxin used: Dysport  Dilution (units/volume): 500:2  Total units used: 1000  Number of units discarded: 0     Muscle 1  Muscle 1: L Splenius capitus  Muscle 1 - Units: 375  Muscle 1 - # Sites: 6  Muscle 2  Muscle 2: R Sternocleidomastoid  Muscle 2 - Units: 125  Muscle 2 - # Sites: 2  Muscle 3  Muscle 3: L Trapezius  Muscle 3 - Units: 125  Muscle 3 - #  Sites: 2  Muscle 4  Muscle 4:  (L Subscapular muscles)  Muscle 4 - Units: 375  Muscle 4 - # Sites: 6       Assessment:     1. Spasmodic torticollis       Non-EMG-guided injections with Dysport took were given to address his left torticollis and mild left shoulder elevation. The procedure was tolerated well without EMG guidance.     Plan:     A four month reinjection was recommended again this time.

## 2019-09-27 ENCOUNTER — Ambulatory Visit: Admit: 2019-09-27 | Payer: MEDICARE | Attending: Neurology

## 2019-09-27 DIAGNOSIS — G243 Spasmodic torticollis: Secondary | ICD-10-CM

## 2019-09-27 MED ORDER — abobotulinum toxin A (DYSPORT) 500 unit SolR
500 | INTRAMUSCULAR | Status: AC
Start: 2019-09-27 — End: 2019-09-27

## 2019-09-27 MED ORDER — abobotulinum toxin A (DYSPORT) SolR 1,000 Units
500 | Freq: Once | INTRAMUSCULAR | Status: AC
Start: 2019-09-27 — End: 2019-09-27
  Administered 2019-09-27: 18:00:00 1000 [IU] via INTRAMUSCULAR

## 2019-09-27 MED ORDER — gabapentin (NEURONTIN) 300 MG capsule
300 | ORAL_CAPSULE | Freq: Every evening | ORAL | 7 refills | Status: AC
Start: 2019-09-27 — End: 2019-10-17

## 2019-09-27 MED ORDER — sodium chloride 0.9%
INTRAMUSCULAR | Status: AC
Start: 2019-09-27 — End: 2019-09-27

## 2019-09-27 MED FILL — DYSPORT 500 UNIT INTRAMUSCULAR SOLUTION: 500 500 unit | INTRAMUSCULAR | Qty: 2

## 2019-09-27 MED FILL — SODIUM CHLORIDE 0.9 % INJECTION SOLUTION: INTRAMUSCULAR | Qty: 10

## 2019-09-27 NOTE — Telephone Encounter (Signed)
[facility] states the max refills on controlled Rx in [place] is 5    Wants to know if ok to change refill on Rx for gabapentin (NEURONTIN) 300 MG capsule    Please follow-up     [phone]

## 2019-09-27 NOTE — Unmapped (Signed)
Called and spoke with the Pharmacy staff and verbalized the allowed max refill in [place]. Staff verbalized understanding.     Call complete.

## 2019-09-27 NOTE — Progress Notes (Signed)
Subjective:      Patient ID: Matthew [name] is a 74 y.o. male.    Movement Disorders HPI    [name] returned for his reinjection session with Dysport to address his left torticollis and mild left shoulder elevation. Response has been excellent. We again did not use EMG guidance into the right sternocleidomastoid, left splenius capitis, and left trapezius (painful area) because of the vasovagal syncope which complicated a prior injection session.         Histories:     He has a past medical history of Environmental allergies, Neck symptom, and Sleep disorder.    He has a past surgical history that includes Hernia repair [phone]); Achilles tendon repair (1/04); and Knee arthroscopy (1976).    His family history includes Colon Cancer in his mother; Heart failure in his father.    He reports that he has never smoked. He has never used smokeless tobacco. He reports current alcohol use. He reports that he does not use drugs.    Review of Systems  Refer to HPI for review of systems documentation.    Allergies:   Patient has no known allergies.    Medications:     Outpatient Encounter Medications as of 09/27/2019   Medication Sig Dispense Refill    ALPHA LIPOIC ACID ORAL Take by mouth.      aspirin 81 MG EC tablet Take 81 mg by mouth daily.      atenolol (TENORMIN) 50 MG tablet       CETIRIZINE HCL (ZYRTEC ORAL) Take by mouth.      doxycycline (VIBRAMYCIN) 50 MG capsule Take 20 mg by mouth.         finasteride (PROSCAR) 5 mg tablet       gabapentin (NEURONTIN) 100 MG capsule Take 2 capsules (200 mg total) by mouth 3 times a day. 180 capsule 5    glucosamine-chondroitin 500-400 mg tablet Take 1 tablet by mouth 3 times a day.      LUMIGAN 0.01 % Drop       omega-3 acid ethyl esters (LOVAZA) 1 gram capsule Take 2 g by mouth daily.      roPINIRole (REQUIP) 3 MG tablet Take 1 tablet (3 mg total) by mouth at bedtime. 90 tablet 3     Facility-Administered Encounter Medications as of 09/27/2019    Medication Dose Route Frequency Provider Last Rate Last Admin    abobotulinum toxin A (DYSPORT) 500 unit SolR             sodium chloride 0.9%             abobotulinum toxin A (DYSPORT) SolR 500 Units  500 Units Intramuscular Once Loura Halt, MD        abobotulinum toxin A (DYSPORT) SolR 500 Units  500 Units Intramuscular Once Loura Halt, MD           Objective:       Blood pressure 131/76, pulse 62, height 5' 11 (1.803 m), weight 178 lb (80.7 kg).    Botulinum Toxin Injection Summary  Was the time out preformed?: Yes  Botulinum toxin used: Dysport  Dilution (units/volume): 500:2  Total units used: 1000  Number of units discarded: 0     Muscle 1  Muscle 1: L Splenius capitus  Muscle 1 - Units: 375  Muscle 1 - # Sites: 6  Muscle 2  Muscle 2: R Sternocleidomastoid  Muscle 2 - Units: 125  Muscle 2 - # Sites: 2  Muscle 3  Muscle 3: L Trapezius  Muscle 3 - Units: 125  Muscle 3 - # Sites: 2  Muscle 4  Muscle 4:  (L Subscapular muscles)  Muscle 4 - Units: 375  Muscle 4 - # Sites: 6       Assessment:     1. Spasmodic torticollis       Non-EMG-guided injections with Dysport took were given to address his left torticollis and mild left shoulder elevation. The procedure was tolerated well without EMG guidance.     Plan:     A four month reinjection was recommended again this time.

## 2019-10-17 MED ORDER — gabapentin (NEURONTIN) 300 MG capsule
300 | ORAL_CAPSULE | Freq: Every evening | ORAL | 3 refills | Status: AC
Start: 2019-10-17 — End: 2020-10-01

## 2019-10-17 MED ORDER — gabapentin (NEURONTIN) 600 MG tablet
600 | ORAL_TABLET | Freq: Every evening | ORAL | 3 refills | Status: AC
Start: 2019-10-17 — End: 2020-10-01

## 2019-10-29 MED ORDER — gabapentin (NEURONTIN) 600 MG tablet
600 | ORAL_TABLET | Freq: Two times a day (BID) | ORAL | 0 refills | Status: AC
Start: 2019-10-29 — End: 2019-12-04

## 2019-12-04 MED ORDER — gabapentin (NEURONTIN) 600 MG tablet
600 | ORAL_TABLET | Freq: Two times a day (BID) | ORAL | 0 refills | Status: AC
Start: 2019-12-04 — End: 2020-01-09

## 2019-12-04 NOTE — Unmapped (Signed)
MA called and spoke with the wife of the pt Matthew [name] and clarified the full name and DOB of the pt as it was not included in the email requesting a new prescription of gabapentin 600 mg two times a day.  Meds were pended, sent to the stated pharmacy [facility] [address] [place] [address] [phone], and waiting for MD to sign. Matthew [name] verbalized understanding.

## 2020-01-09 NOTE — Unmapped (Signed)
Controlled Substance Monitoring 01/09/2020   OARRS/eKASPER Status Reviewed   OARRS/eKASPER Consistent with Prescriber Expectation Y     I have completed the required OARRS/eKASPER documentation for this patient on 01/09/2020.  [employer]

## 2020-01-10 NOTE — Unmapped (Signed)
From: Espay, Alberto ([email or web address]) [email or web address]>   Sent: Thursday, October 17, 2019 9:04 AM  To: Shaune Leeks [email or web address]>  Subject: Re: ENCRYPT RE: Gabapentin    Hi [name],    Sorry for having missed your initial email. I will ask [facility] to issue a prescription for my signature of gabapentin 600 mg twice a day, one tablet in the morning, one tablet in the evening. This will help also with some of the daytime restlessness you have. I???ll make sure she sends this to the pharmacy you have indicated below.    All the best,  [name]    From: Dionicio Stall [email or web address]>  Date: Monday, October 28, 2019 at 9:10 PM  To: Loura Halt ([email or web address]) [email or web address]>  Subject: Re: [name]   Dr. Waunita Schooner,     You may have missed my email below. We will be in [place] through this Wednesday.     Thanks,  Chase Caller from my iPhone    On Oct 24, 2019, at 1:09 PM, Dionicio Stall [email or web address]> wrote:  ? Dear Dr. Waunita Schooner,     I weaned myself from the Neupro and went to the 900 mg dose of gabapentin as you last prescribed; however, my difficulty getting to sleep actually got worse so I added ?? patch of neupro again (that half equals 2 mg). But my difficulty is mainly getting to sleep continues because of my restless body (not just legs). Do I still need a higher dose of gabapentin? I even had terrible restlessness and discomfort on our airplane flight to [place], [place]. I also have restlessness during the day. I get a ???jolt??? in a leg, an arm, somewhere in my body day or night. Of course, it???s better in the day when I can move around.     The pharmacist at home said that if I use any of the prescribed amount from a previous script to increase the dosage, they cannot fill a new prescription because it???s a controlled substance. She said you need to write a whole new script with a different dose.  (You first RX???d two 300 mg at bedtime (and then I started adding 300  mg a couple of nights) and then you wrote 600 mg plus 300 mg. The pharmacy provider said I should have had enough of the 300 to finish, but I didn???t have enough to use until the next refill date because I had been dipping into it. Fortunately, I had some left over from over a year ago. If you change the dose now, I???m not sure what they???ll calculate as to what I have left. I think if you change the milligram per tablet, that will go through. Sorry to be confusing, but I???m not really certain myself.     We are in still [place], [place] until 9/30. If I need a change in dosage, please send it to:  [facility]  [address] [place], [place]  [address]  [phone]    Donny Pique   [phone]  Sent from my iPhone

## 2020-01-10 NOTE — Unmapped (Addendum)
Called and spoke with the pt and his wife [name] and clarified the meds information. Pt stated that he started taking 600 mg 2x a day since 12/04/19, and received  for 1 month supply only. Pt stated that he didn't receive the prescription last 10/17/19 because the insurance didn't want to cover it for the early refill. MA called the Pharmacist, Aundra Millet to clarify the med information and where the 120 qty was coming from if the pt only received 60 tablets (written in the med  bottle of the pt). Aundra Millet stated that their pharmacy called before for another refill but nobody called them back. Megan asked for a verbal order of the gabapentin 600 mg  2x/day for 30 days.MA gave a verbal order.  MA called and informed the pt that meds will be available later today. Pt verbalized understanding.     Call complete.

## 2020-01-10 NOTE — Unmapped (Signed)
[facility],    Can you please help with this matter.     Alic Hilburn   [phone]    Begin forwarded message:  From: Shanon Brow [email or web address]>  Date: January 10, 2020 at 7:49:55 AM EST  To: [name] [email or web address]>  Cc: Donny Pique [email or web address]>  Subject: Unable to get Gabapentin refilled  ?Re: Dekota Shenk   B.D. July 09, 1945    The pharmacy has no more refills for gabapentin. Because the last RX was for 600 mg, qty 120 for 60 days, the insurance won???t refill it even when a new RX is sent in.     [name] was taking 600 mg bid.     So, [name] needs a new RX, but the dilemma is that the insurance will say that he should have 30 more days supply of 600 mg on hand.     [name] only has a few 200 mg tablets from an old RX.         [facility],    Can you please help with this matter.     Aaric Dolph   [phone]    Begin forwarded message:  From: Shanon Brow [email or web address]>  Date: January 10, 2020 at 7:49:55 AM EST  To: [name] [email or web address]>  Cc: Donny Pique [email or web address]>  Subject: Unable to get Gabapentin refilled  ?Re: Keghan Mcfarren   B.D. 07-08-1945    The pharmacy has no more refills for gabapentin. Because the last RX was for 600 mg, qty 120 for 60 days, the insurance won???t refill it even when a new RX is sent in.     [name] was taking 600 mg bid.     So, [name] needs a new RX, but the dilemma is that the insurance will say that he should have 30 more days supply of 600 mg on hand.     [name] only has a few 200 mg tablets from an old RX.

## 2020-01-14 MED ORDER — gabapentin (NEURONTIN) 600 MG tablet
600 | ORAL_TABLET | Freq: Two times a day (BID) | ORAL | 1 refills | Status: AC
Start: 2020-01-14 — End: 2020-07-24

## 2020-01-17 ENCOUNTER — Ambulatory Visit: Admit: 2020-01-17 | Payer: MEDICARE | Attending: Neurology

## 2020-01-17 DIAGNOSIS — G243 Spasmodic torticollis: Secondary | ICD-10-CM

## 2020-01-17 MED ORDER — abobotulinum toxin A (DYSPORT) SolR 1,000 Units
500 | Freq: Once | INTRAMUSCULAR | Status: AC
Start: 2020-01-17 — End: 2020-01-17
  Administered 2020-01-17: 18:00:00 1000 [IU] via INTRAMUSCULAR

## 2020-01-17 MED ORDER — abobotulinum toxin A (DYSPORT) 500 unit SolR
500 | INTRAMUSCULAR | Status: AC
Start: 2020-01-17 — End: 2020-01-17

## 2020-01-17 MED FILL — DYSPORT 500 UNIT INTRAMUSCULAR SOLUTION: 500 500 unit | INTRAMUSCULAR | Qty: 2

## 2020-01-17 NOTE — Unmapped (Signed)
Subjective:      Patient ID: Matthew [name] is a 74 y.o. male.    Movement Disorders HPI    Matthew [name] returned for his reinjection session with Dysport to address his left torticollis and mild left shoulder elevation. Response has been excellent. We again did not use EMG guidance into the right sternocleidomastoid, left splenius capitis, and left trapezius (painful area) because of the vasovagal syncope which complicated a remote injection session. Gabapentin has helped but further reductions in Neupro have not been possible.         Histories:     He has a past medical history of Environmental allergies, Neck symptom, and Sleep disorder.    He has a past surgical history that includes Hernia repair [phone]); Achilles tendon repair (1/04); and Knee arthroscopy (1976).    His family history includes Colon Cancer in his mother; Heart failure in his father.    He reports that he has never smoked. He has never used smokeless tobacco. He reports current alcohol use. He reports that he does not use drugs.    Review of Systems  Refer to HPI for review of systems documentation.    Allergies:   Patient has no known allergies.    Medications:     Outpatient Encounter Medications as of 01/17/2020   Medication Sig Dispense Refill   ??? ALPHA LIPOIC ACID ORAL Take by mouth.     ??? aspirin 81 MG EC tablet Take 81 mg by mouth daily.     ??? atenolol (TENORMIN) 50 MG tablet      ??? CETIRIZINE HCL (ZYRTEC ORAL) Take by mouth.     ??? doxycycline (VIBRAMYCIN) 50 MG capsule Take 20 mg by mouth.        ??? finasteride (PROSCAR) 5 mg tablet      ??? gabapentin (NEURONTIN) 100 MG capsule Take 2 capsules (200 mg total) by mouth 3 times a day. 180 capsule 5   ??? gabapentin (NEURONTIN) 300 MG capsule Take 1 capsule (300 mg total) by mouth at bedtime. 90 capsule 3   ??? gabapentin (NEURONTIN) 600 MG tablet Take 1 tablet (600 mg total) by mouth at bedtime. 90 tablet 3   ??? gabapentin (NEURONTIN) 600 MG tablet Take 1 tablet (600 mg total) by  mouth 2 times a day. 180 tablet 1   ??? glucosamine-chondroitin 500-400 mg tablet Take 1 tablet by mouth 3 times a day.     ??? LUMIGAN 0.01 % Drop      ??? omega-3 acid ethyl esters (LOVAZA) 1 gram capsule Take 2 g by mouth daily.     ??? roPINIRole (REQUIP) 3 MG tablet Take 1 tablet (3 mg total) by mouth at bedtime. 90 tablet 3   ??? [DISCONTINUED] gabapentin (NEURONTIN) 600 MG tablet Take 1 tablet (600 mg total) by mouth 2 times a day. 60 tablet 0     Facility-Administered Encounter Medications as of 01/17/2020   Medication Dose Route Frequency Provider Last Rate Last Admin   ??? abobotulinum toxin A (DYSPORT) 500 unit SolR            ??? abobotulinum toxin A (DYSPORT) SolR 500 Units  500 Units Intramuscular Once Loura Halt, MD       ??? abobotulinum toxin A (DYSPORT) SolR 500 Units  500 Units Intramuscular Once Loura Halt, MD           Objective:       Blood pressure 167/87, pulse 62, height 5' 11 (1.803 m), weight 188  lb (85.3 kg).    Botulinum Toxin Injection Summary  Was the time out preformed?: Yes  Botulinum toxin used: Dysport  Dilution (units/volume): 500:2  Total units used: 1000  Number of units discarded: 0     Muscle 1  Muscle 1: L Splenius capitus  Muscle 1 - Units: 375  Muscle 1 - # Sites: 6  Muscle 2  Muscle 2: R Sternocleidomastoid  Muscle 2 - Units: 125  Muscle 2 - # Sites: 2  Muscle 3  Muscle 3: L Trapezius  Muscle 3 - Units: 125  Muscle 3 - # Sites: 2  Muscle 4  Muscle 4:  (L Subscapular muscles)  Muscle 4 - Units: 375  Muscle 4 - # Sites: 6       Assessment:     1. Spasmodic torticollis       Non-EMG-guided injections with Dysport took were given to address his left torticollis and mild left shoulder elevation. The procedure was tolerated well without EMG guidance.     Plan:     A four month reinjection was recommended again this time. To improve the restlessness and enhance tapering of Neupro, we will increase gabapentin to 1800 mg/day.

## 2020-01-23 NOTE — Unmapped (Signed)
Open in error

## 2020-02-03 MED ORDER — gabapentin (NEURONTIN) 600 MG tablet
600 | ORAL_TABLET | Freq: Two times a day (BID) | ORAL | 2 refills | Status: AC
Start: 2020-02-03 — End: 2020-10-01

## 2020-02-03 NOTE — Unmapped (Signed)
Called pt to let him know that gabapentin 600 mg BID was refilled and sent to the pharmacy stated in the last email. MA didn't see any document that it was increased to TID. No answer. LVM for a return call.

## 2020-02-03 NOTE — Unmapped (Signed)
Thank you, Guillin. This was taken care of.     Happy New Year!     Matthew [name]    From: Harrell Gave, Guillin [email or web address]>  Date: Monday, February 03, 2020 at 8:31 AM  To: Loura Halt ([email or web address]) [email or web address]  Subject: RE: Gabapentin  Good Morning Dr. Waunita Schooner!     Pended gabapentin new order 600 mg BID [facility]      [facility]  [address] [place], [place] [address]  ( 1 month supply and 2 refills).      Happy  New Year!!!     Guillin         From: Dionicio Stall [email or web address]>   Sent: Sunday, February 02, 2020 1:50 PM  To: UC-Espay, Alberto (espayaj) <[email or web address]>  Cc: Kamuf, Guillin <[email or web address]>  Subject: Re: Gabapentin     Thank you for replying on a holiday weekend. The pharmacy will report that insurance will not cover it yet, despite a change in dosage, because I should have some left. Can Guillin please explain that I must have left the remaining tablets in [place].      Happy New Year to you, too!     [name]      On Feb 02, 2020, at 11:29 AM, Espay, Alberto (espayaj) <[email or web address]> wrote:  ?   Sure, [name]. With Guillin???s help, we???ll issue a new prescription for you tomorrow, Monday. As a controlled released substance, it cannot be called directly to the pharmacy.      Wishing you and [name] a Happy New Year!     Alberto     From: Junah J. Pettinato <[email or web address]>  Date: Sunday, February 02, 2020 at 10:53 AM  To: Espay, Alberto (espayaj) <[email or web address]>  Subject: Gabapentin   Re: Abshir Shakoor  B.D. 03/23/1945     Dr. Espay,  We arrived in [place], [place] today. Somehow I didn???t bring my gabapentin from [place]. I either lost it, forgot it, or used the remainder of what I picked up on 01/14/20, which was a prescription for 600 bid. Because Gabapentin is a controlled substance, [facility] [place] [place].  Nevertheless, the old RX will not be enough,  since you increased my dosage to 600 mg in the morning and 1200 at night.     I would very much appreciate if you could call in a new RX. I have enough gabapentin to get me through Monday night.     Here is the contact information for the local pharmacy:       [facility]        [address] [place] [address]       [phone]     Thank you so much and Happy New Year!  Joseluis Alessio  [phone]        Confidentiality Notice: This e-mail message, including any attachments, is for the sole use of the intended recipient(s) and may contain confidential and privileged information. Any unauthorized review, use, disclosure or distribution is prohibited. If you are not the intended recipient, please contact the sender by reply e-mail and destroy all copies of the original message.

## 2020-02-03 NOTE — Unmapped (Addendum)
Called and spoke with the [facility] staff [facility] [place], [place] [phone]) and explained that the pt forgot the medicine in [place] and needed to correct the new prescription to [facility] for a 600 mg Gabapentin, I month supply and 5 refills (verbal order). Pharmacy staff updated it  and stated that it will be available tomorrow.  MA clarified if it is still being covered by the insurance. Pharmacy staff double checked and it is covered by the pt's insurance. MA called to inform the pt. No answer. LVM for a return call to the office.

## 2020-02-03 NOTE — Unmapped (Signed)
Pt calling  MD increased Gsbepentin 600mg  to 1 tab TID  Please resend new Rx with correct dosing  Pt is out of medication

## 2020-06-19 ENCOUNTER — Ambulatory Visit: Admit: 2020-06-19 | Payer: MEDICARE | Attending: Neurology

## 2020-06-19 DIAGNOSIS — G243 Spasmodic torticollis: Secondary | ICD-10-CM

## 2020-06-19 MED ORDER — abobotulinum toxin A (DYSPORT) SolR 1,000 Units
500 | Freq: Once | INTRAMUSCULAR | Status: AC
Start: 2020-06-19 — End: 2020-06-19
  Administered 2020-06-19: 17:00:00 1000 [IU] via INTRAMUSCULAR

## 2020-06-19 NOTE — Unmapped (Signed)
Subjective:      Patient ID: Matthew [name] is a 75 y.o. male.    Movement Disorders HPI    Matthew [name] returned for his reinjection session with Dysport to address his left torticollis and mild left shoulder elevation. Response has been excellent. We again did not use EMG guidance into the right sternocleidomastoid, left splenius capitis, and left trapezius (painful area) because of the vasovagal syncope which complicated an old injection session.        Histories:     He has a past medical history of Environmental allergies, Neck symptom, and Sleep disorder.    He has a past surgical history that includes Hernia repair [phone]); Achilles tendon repair (1/04); and Knee arthroscopy (1976).    His family history includes Colon Cancer in his mother; Heart failure in his father.    He reports that he has never smoked. He has never used smokeless tobacco. He reports current alcohol use. He reports that he does not use drugs.    Review of Systems  Refer to HPI for review of systems documentation.    Allergies:   Patient has no known allergies.    Medications:     Outpatient Encounter Medications as of 06/19/2020   Medication Sig Dispense Refill   ??? ALPHA LIPOIC ACID ORAL Take by mouth.     ??? amLODIPine (NORVASC) 5 MG tablet Take 5 mg by mouth daily.     ??? CETIRIZINE HCL (ZYRTEC ORAL) Take by mouth.     ??? finasteride (PROSCAR) 5 mg tablet      ??? gabapentin (NEURONTIN) 100 MG capsule Take 2 capsules (200 mg total) by mouth 3 times a day. 180 capsule 5   ??? gabapentin (NEURONTIN) 300 MG capsule Take 1 capsule (300 mg total) by mouth at bedtime. 90 capsule 3   ??? gabapentin (NEURONTIN) 600 MG tablet Take 1 tablet (600 mg total) by mouth at bedtime. 90 tablet 3   ??? gabapentin (NEURONTIN) 600 MG tablet Take 1 tablet (600 mg total) by mouth 2 times a day. 180 tablet 1   ??? gabapentin (NEURONTIN) 600 MG tablet Take 1 tablet (600 mg total) by mouth 2 times a day. 60 tablet 2   ??? glucosamine-chondroitin 500-400 mg tablet  Take 1 tablet by mouth 3 times a day.     ??? LUMIGAN 0.01 % Drop      ??? omega-3 acid ethyl esters (LOVAZA) 1 gram capsule Take 2 g by mouth daily.     ??? roPINIRole (REQUIP) 3 MG tablet Take 1 tablet (3 mg total) by mouth at bedtime. 90 tablet 3   ??? aspirin 81 MG EC tablet Take 81 mg by mouth daily.     ??? atenolol (TENORMIN) 50 MG tablet      ??? doxycycline (VIBRAMYCIN) 50 MG capsule Take 20 mg by mouth.          Facility-Administered Encounter Medications as of 06/19/2020   Medication Dose Route Frequency Provider Last Rate Last Admin   ??? abobotulinum toxin A (DYSPORT) SolR 500 Units  500 Units Intramuscular Once Loura Halt, MD       ??? abobotulinum toxin A (DYSPORT) SolR 500 Units  500 Units Intramuscular Once Loura Halt, MD           Objective:       Blood pressure 134/75, pulse 57, weight 188 lb 14.4 oz (85.7 kg).    Botulinum Toxin Injection Summary  Was the time out preformed?: Yes  Botulinum toxin  used: Dysport  Dilution (units/volume): 500:2  Total units used: 1000  Number of units discarded: 0     Muscle 1  Muscle 1: L Splenius capitus  Muscle 1 - Units: 375  Muscle 1 - # Sites: 6  Muscle 2  Muscle 2: R Sternocleidomastoid  Muscle 2 - Units: 125  Muscle 2 - # Sites: 2  Muscle 3  Muscle 3: L Trapezius  Muscle 3 - Units: 125  Muscle 3 - # Sites: 2  Muscle 4  Muscle 4:  (L Subscapular muscles)  Muscle 4 - Units: 375  Muscle 4 - # Sites: 6       Assessment:     1. Spasmodic torticollis       Non-EMG-guided injections with Dysport took were given to address his left torticollis and mild left shoulder elevation. The procedure was tolerated well.     Plan:     A four month reinjection was recommended again this time.

## 2020-06-23 NOTE — Unmapped (Signed)
Called patient. Appointment rescheduled.    Appointment Information   Name: Matthew [name], Matthew [name] MRN: [id number]   Date: 09/25/2020 Status: Sch   Time: 3:00 PM Length: 30   Visit Mode: In Person     Visit Type: MEDICATION PROCEDURE [4801] Copay: $0.00   Provider: Loura Halt, MD Department: [facility] NEUROLOGY [facility]   Referring Provider: ?? CSN: [id number]   Notes: ??   Disposition Notes: ??   Rescheduled: 06/23/2020 2:52 PM By: Pauletta Browns   Rescheduled From: Fri Oct 30, 2020 12:30 PM     Appointment Instructions   Visit Type: MEDICATION

## 2020-06-23 NOTE — Unmapped (Signed)
Pt called and would like to move up his medication procedure to sometime in August in the afternoon

## 2020-06-26 ENCOUNTER — Ambulatory Visit: Payer: MEDICARE | Attending: Neurology

## 2020-07-24 MED ORDER — gabapentin (NEURONTIN) 600 MG tablet
600 | ORAL_TABLET | Freq: Three times a day (TID) | ORAL | 5 refills | Status: AC
Start: 2020-07-24 — End: 2021-01-15

## 2020-07-24 NOTE — Unmapped (Addendum)
Spoke to Marueno. He wanted to confirm writer got his message. Patient currently has 2 Rx for gabapentin 600mg . One for 1 tab BID, the other for 1 tab QHS. Pharmacy will not dispense both scripts. New script written for 1 tab TID and sent to Dr. Waunita Schooner.

## 2020-07-24 NOTE — Unmapped (Signed)
Patient is requesting a refill for Gabapentin 600mg  , he states he takes this 3 times daily and he is also requesting to speak with [facility].     Patient-[phone]

## 2020-07-24 NOTE — Unmapped (Signed)
Controlled Substance Monitoring 01/09/2020 07/24/2020   OARRS/eKASPER Status Reviewed Reviewed   OARRS/eKASPER Consistent with Prescriber Expectation Y Y     I have completed the required OARRS/eKASPER documentation for this patient on 07/24/2020.  [employer]

## 2020-09-25 ENCOUNTER — Ambulatory Visit: Admit: 2020-09-25 | Payer: MEDICARE | Attending: Neurology

## 2020-09-25 ENCOUNTER — Ambulatory Visit: Payer: MEDICARE | Attending: Neurology

## 2020-09-25 DIAGNOSIS — G243 Spasmodic torticollis: Secondary | ICD-10-CM

## 2020-09-25 MED ORDER — abobotulinum toxin A (DYSPORT) 500 unit SolR
500 | INTRAMUSCULAR | Status: AC
Start: 2020-09-25 — End: 2020-09-25

## 2020-09-25 MED ORDER — abobotulinumtoxinADYSPORTSolR1000Units
500 | Freq: Once | INTRAMUSCULAR | Status: AC
Start: 2020-09-25 — End: 2020-09-25
  Administered 2020-09-25: 20:00:00 1000 [IU] via INTRAMUSCULAR

## 2020-09-25 MED FILL — DYSPORT 500 UNIT INTRAMUSCULAR SOLUTION: 500 500 unit | INTRAMUSCULAR | Qty: 2

## 2020-09-25 NOTE — Unmapped (Signed)
Subjective:      Patient ID: Matthew [name] is a 75 y.o. male.    Movement Disorders HPI    Mr. [name] returned for his reinjection session with Dysport to address his left torticollis and mild left shoulder elevation. Response has been excellent. We again did not use EMG guidance into the right sternocleidomastoid, left splenius capitis, and left trapezius (painful area) because of the vasovagal syncope which complicated an old injection session.        Histories:     He has a past medical history of Environmental allergies, Neck symptom, and Sleep disorder.    He has a past surgical history that includes Hernia repair [phone]); Achilles tendon repair (1/04); and Knee arthroscopy (1976).    His family history includes Colon Cancer in his mother; Heart failure in his father.    He reports that he has never smoked. He has never used smokeless tobacco. He reports current alcohol use. He reports that he does not use drugs.    Review of Systems  Refer to HPI for review of systems documentation.    Allergies:   Patient has no known allergies.    Medications:     Outpatient Encounter Medications as of 09/25/2020   Medication Sig Dispense Refill   ??? ALPHA LIPOIC ACID ORAL Take by mouth.     ??? amLODIPine (NORVASC) 5 MG tablet Take 5 mg by mouth daily.     ??? aspirin 81 MG EC tablet Take 81 mg by mouth daily.     ??? atenolol (TENORMIN) 50 MG tablet      ??? CETIRIZINE HCL (ZYRTEC ORAL) Take by mouth.     ??? doxycycline (VIBRAMYCIN) 50 MG capsule Take 20 mg by mouth.        ??? finasteride (PROSCAR) 5 mg tablet      ??? gabapentin (NEURONTIN) 100 MG capsule Take 2 capsules (200 mg total) by mouth 3 times a day. 180 capsule 5   ??? gabapentin (NEURONTIN) 300 MG capsule Take 1 capsule (300 mg total) by mouth at bedtime. 90 capsule 3   ??? gabapentin (NEURONTIN) 600 MG tablet Take 1 tablet (600 mg total) by mouth at bedtime. 90 tablet 3   ??? gabapentin (NEURONTIN) 600 MG tablet Take 1 tablet (600 mg total) by mouth 2 times a day.  60 tablet 2   ??? gabapentin (NEURONTIN) 600 MG tablet Take 1 tablet (600 mg total) by mouth 3 times a day. 90 tablet 5   ??? glucosamine-chondroitin 500-400 mg tablet Take 1 tablet by mouth 3 times a day.     ??? LUMIGAN 0.01 % Drop      ??? omega-3 acid ethyl esters (LOVAZA) 1 gram capsule Take 2 g by mouth daily.     ??? roPINIRole (REQUIP) 3 MG tablet Take 1 tablet (3 mg total) by mouth at bedtime. 90 tablet 3     Facility-Administered Encounter Medications as of 09/25/2020   Medication Dose Route Frequency Provider Last Rate Last Admin   ??? abobotulinum toxin A (DYSPORT) SolR 500 Units  500 Units Intramuscular Once Loura Halt, MD       ??? abobotulinum toxin A (DYSPORT) SolR 500 Units  500 Units Intramuscular Once Loura Halt, MD           Objective:       Blood pressure 131/76, pulse 59.    Botulinum Toxin Injection Summary  Was the time out preformed?: Yes  Botulinum toxin used: Dysport  Dilution (units/volume): 500:2  Total units used: 1000  Number of units discarded: 0     Muscle 1  Muscle 1: L Splenius capitus  Muscle 1 - Units: 375  Muscle 1 - # Sites: 6  Muscle 2  Muscle 2: R Sternocleidomastoid  Muscle 2 - Units: 125  Muscle 2 - # Sites: 2  Muscle 3  Muscle 3: L Trapezius  Muscle 3 - Units: 125  Muscle 3 - # Sites: 2  Muscle 4  Muscle 4:  (L Subscapular muscles)  Muscle 4 - Units: 375  Muscle 4 - # Sites: 6       Assessment:     1. Spasmodic torticollis       Non-EMG-guided injections with Dysport took were given to address his left torticollis and mild left shoulder elevation. The procedure was tolerated well.     Plan:     A four month reinjection was recommended again this time.

## 2020-10-01 ENCOUNTER — Ambulatory Visit: Admit: 2020-10-01 | Discharge: 2020-10-01 | Payer: MEDICARE

## 2020-10-01 DIAGNOSIS — J343 Hypertrophy of nasal turbinates: Secondary | ICD-10-CM

## 2020-10-01 MED ORDER — cetirizine (ZYRTEC) 10 MG tablet
10 | ORAL_TABLET | Freq: Every day | ORAL | 11 refills | Status: AC
Start: 2020-10-01 — End: ?

## 2020-10-01 MED ORDER — azelastine (ASTELIN) 137 mcg (0.1 %) nasal spray
137 | Freq: Two times a day (BID) | NASAL | 6 refills | Status: AC
Start: 2020-10-01 — End: ?

## 2020-10-01 NOTE — Unmapped (Signed)
[facility]  Department of Otolaryngology--Head and Neck Surgery  Division of Rhinology, Allergy, and Anterior Skull Base Surgery  Lorretta Harp, MD  [facility]  [address] [place], [place] [address]  Telephone: [phone]      Pt name: Matthew [name]  Date: 10/01/2020   MRN: [id number]    Chief Complaint:  Chief Complaint   Patient presents with   ??? Nose Problem   ??? New Patient Visit/ Consultation     Pt. States mucus/ congestion, he finds hisself coughing up mucus a lot.         Referred by: Ladell Pier, MD    History Present Illness:  This is a 75 y.o. male who presents with bilateral congestion, rhinorrhea and postnasal drip.  He reports he has had lifelong nasal congestion which is gotten progressively worse over time.  He does have a history of allergic rhinitis and underwent several years of immunotherapy.  He is currently using Astelin once daily with some improvement although not enough.  He reports always needing a tissue.  No history of sinonasal surgery.  He had previously used Zyrtec and Flonase with good control although has since stopped and cannot remember why.  He does have significant snoring.  He also breathes through his mouth at night because he does not get enough air through his nose.  He does have a history of obstructive sleep apnea, but could not tolerate CPAP secondary to the high nasal pressure.  He also notes several decade history of cough.  He currently takes amlodipine for high blood pressure.  He has been tested for asthma in the past and did have a positive test although denies frequent flares of asthma.  He occasionally will get acid reflux when he has a snack before bed, but is not on a chronic medication for this    Radiology Findings:   No results found for the past 12 months    Past Medical History:  Past Medical History:   Diagnosis Date   ??? Environmental allergies    ??? Neck symptom     Trouble turning head to the left    ??? Sleep  disorder        Past Surgical History:  Past Surgical History:   Procedure Laterality Date   ??? ACHILLES TENDON REPAIR  1/04    left    ??? HERNIA REPAIR  1953   ??? KNEE ARTHROSCOPY  1976    left        Medications:  Current Outpatient Medications on File Prior to Visit   Medication Sig Dispense Refill   ??? ALPHA LIPOIC ACID ORAL Take by mouth.     ??? amLODIPine (NORVASC) 5 MG tablet Take 5 mg by mouth daily.     ??? aspirin 81 MG EC tablet Take 81 mg by mouth daily.     ??? atenolol (TENORMIN) 50 MG tablet      ??? CETIRIZINE HCL (ZYRTEC ORAL) Take by mouth.     ??? doxycycline (VIBRAMYCIN) 50 MG capsule Take 20 mg by mouth.        ??? finasteride (PROSCAR) 5 mg tablet      ??? gabapentin (NEURONTIN) 100 MG capsule Take 2 capsules (200 mg total) by mouth 3 times a day. 180 capsule 5   ??? gabapentin (NEURONTIN) 300 MG capsule Take 1 capsule (300 mg total) by mouth at bedtime. 90 capsule 3   ??? gabapentin (NEURONTIN) 600 MG tablet Take 1 tablet (  600 mg total) by mouth at bedtime. 90 tablet 3   ??? gabapentin (NEURONTIN) 600 MG tablet Take 1 tablet (600 mg total) by mouth 2 times a day. 60 tablet 2   ??? gabapentin (NEURONTIN) 600 MG tablet Take 1 tablet (600 mg total) by mouth 3 times a day. 90 tablet 5   ??? glucosamine-chondroitin 500-400 mg tablet Take 1 tablet by mouth 3 times a day.     ??? LUMIGAN 0.01 % Drop      ??? omega-3 acid ethyl esters (LOVAZA) 1 gram capsule Take 2 g by mouth daily.     ??? roPINIRole (REQUIP) 3 MG tablet Take 1 tablet (3 mg total) by mouth at bedtime. 90 tablet 3     Current Facility-Administered Medications on File Prior to Visit   Medication Dose Route Frequency Provider Last Rate Last Admin   ??? abobotulinum toxin A (DYSPORT) SolR 500 Units  500 Units Intramuscular Once Loura Halt, MD       ??? abobotulinum toxin A (DYSPORT) SolR 500 Units  500 Units Intramuscular Once Loura Halt, MD           Medication Allergies:  No Known Allergies    Social History:  Social History     Tobacco Use   Smoking Status Never  Smoker   Smokeless Tobacco Never Used   Tobacco Comment    02/16/10       Physical Exam:  Vitals:    10/01/20 1314   BP: 137/75   Pulse: 53   SpO2: 99%       General Appearance: well appearing, in no apparent distress  Ears: Pinna normal bilaterally, EAC clear bilaterally, TM clear bilaterally, hearing grossly intact  Anterior Rhinoscopy: Septum is deviated;  right.  Inferior turbinates with hypertrophy bilaterally.  Oral cavity: Moist mucous membranes, tongue mobile, palate elevates symmetrically, no visible drainage in the posterior oropharynx      SINUS PROCEDURE NOTE  Procedure: Rigid nasal endoscopy  Pre-procedure diagnosis/Indication for procedure: To evaluate areas not seen on anterior rhinoscopy  Anesthesia: topical 1% phenylephrine & 4% lidocaine   Description: After verbal consent, a 30 degree rigid nasal endoscope was used to examine the left and right nasal cavities. The nasal valve areas were examined for abnormalities or collapse. The inferior and middle turbinates were evaluated. The middle and superior meatuses, the  sphenoethmoid recesses, and the nasopharynx were examined and inspected for mucopurulence and polyps. The patient tolerated the procedure without complications and was returned to ambulatory status.      Findings:  All structures appeared normal/unremarkable except as noted below    Bilateral inferior turbinate hypertrophy  Right septal deviation  Mild mucosal edema in the left middle meatus  Unable to visualize the right middle meatus secondary to septal deviation        Impression & Plan:         Nasal obstruction  Bilateral inferior turbinate hypertrophy  Septal deviation  Cough    Increase Astelin to 2 sprays twice daily  Start Flonase 2 sprays twice daily  Start Zyrtec  Follow-up in 6 weeks  Can consider omeprazole or asthma work-up in the future if cough is not resolved with above medical therapy   can consider surgery if nasal obstruction does not improve with medical therapy              Number and Complexity of Problems (Level 4)  During this encounter, I addressed 2 or more stable chronic illnesses  Amount and/or Complexity of Data Ordered, Reviewed, or Analyzed (Level 2)  I reviewed 1 external note(s) from: PCP note.        Risk of Complication and/or Morbidity or Mortality of Patient Management (Level  3)  The patient's management entails Low risk of complications and/or morbidity or mortality.    Overall LOS:  3          Attestation: in the case of resident participation in the care of this patient, I attest that I have seen and examined the patient, formulated the plan and agree with the documentation.      Lorretta Harp, MD  Division of Rhinology, Allergy, and Anterior Skull Base Surgery  Department of Otolaryngology--Head and Neck Surgery  [facility]  [place] [facility]    CC:   MATTHEW Peter Garter, MD  Ladell Pier, MD    **This note was dictated with voice-recognition software**

## 2020-10-14 ENCOUNTER — Ambulatory Visit: Payer: MEDICARE

## 2020-10-30 ENCOUNTER — Ambulatory Visit: Payer: MEDICARE | Attending: Neurology

## 2020-11-12 ENCOUNTER — Ambulatory Visit: Payer: MEDICARE

## 2020-11-19 ENCOUNTER — Ambulatory Visit: Admit: 2020-11-19 | Discharge: 2020-11-19 | Payer: MEDICARE

## 2020-11-19 DIAGNOSIS — J3489 Other specified disorders of nose and nasal sinuses: Secondary | ICD-10-CM

## 2020-11-19 NOTE — Unmapped (Signed)
[facility]  Department of Otolaryngology--Head and Neck Surgery  Division of Rhinology, Allergy, and Anterior Skull Base Surgery  Lorretta Harp, MD  [facility]  [address] [place], [place] [address]  Telephone: [phone]      Pt name: Matthew [name]  Date: 11/19/2020   MRN: [id number]    Chief Complaint:  Chief Complaint   Patient presents with   ??? Nose Problem     Pt. States he is doing good.         Referred by: Ladell Pier, MD    History Present Illness:  This is a 75 y.o. male who presents with bilateral congestion, rhinorrhea and postnasal drip.  He reports he has had lifelong nasal congestion which is gotten progressively worse over time.  He does have a history of allergic rhinitis and underwent several years of immunotherapy.  He is currently using Astelin once daily with some improvement although not enough.  He reports always needing a tissue.  No history of sinonasal surgery.  He had previously used Zyrtec and Flonase with good control although has since stopped and cannot remember why.  He does have significant snoring.  He also breathes through his mouth at night because he does not get enough air through his nose.  He does have a history of obstructive sleep apnea, but could not tolerate CPAP secondary to the high nasal pressure.  He also notes several decade history of cough.  He currently takes amlodipine for high blood pressure.  He has been tested for asthma in the past and did have a positive test although denies frequent flares of asthma.  He occasionally will get acid reflux when he has a snack before bed, but is not on a chronic medication for this.     Interval 11/19/2020   Here for routine follow up.  Currently on flonase, astelin not currently using Zyrtec secondary to fatigue.,  Reports Improvement in congestion, rhinorrhea and postnasal drip.  He still has a cough although it is better.   He is currently satisfied with where his symptoms  are.     Radiology Findings:   No results found for the past 12 months    Past Medical History:  Past Medical History:   Diagnosis Date   ??? Environmental allergies    ??? Neck symptom     Trouble turning head to the left    ??? Sleep disorder        Past Surgical History:  Past Surgical History:   Procedure Laterality Date   ??? ACHILLES TENDON REPAIR  1/04    left    ??? HERNIA REPAIR  1953   ??? KNEE ARTHROSCOPY  1976    left        Medications:  Current Outpatient Medications on File Prior to Visit   Medication Sig Dispense Refill   ??? ALPHA LIPOIC ACID ORAL Take by mouth.     ??? amLODIPine (NORVASC) 5 MG tablet Take 5 mg by mouth daily.     ??? aspirin 81 MG EC tablet Take 81 mg by mouth daily.     ??? azelastine (ASTELIN) 137 mcg (0.1 %) nasal spray Use 2 sprays into each nostril 2 times a day. 30 mL 6   ??? cetirizine (ZYRTEC) 10 MG tablet Take 1 tablet (10 mg total) by mouth daily. 30 tablet 11   ??? CETIRIZINE HCL (ZYRTEC ORAL) Take by mouth.     ??? doxycycline (VIBRAMYCIN) 50 MG capsule  Take 20 mg by mouth.        ??? finasteride (PROSCAR) 5 mg tablet      ??? gabapentin (NEURONTIN) 600 MG tablet Take 1 tablet (600 mg total) by mouth 3 times a day. 90 tablet 5   ??? LUMIGAN 0.01 % Drop      ??? omega-3 acid ethyl esters (LOVAZA) 1 gram capsule Take 2 g by mouth daily.     ??? roPINIRole (REQUIP) 3 MG tablet Take 1 tablet (3 mg total) by mouth at bedtime. 90 tablet 3     Current Facility-Administered Medications on File Prior to Visit   Medication Dose Route Frequency Provider Last Rate Last Admin   ??? abobotulinum toxin A (DYSPORT) SolR 500 Units  500 Units Intramuscular Once Loura Halt, MD       ??? abobotulinum toxin A (DYSPORT) SolR 500 Units  500 Units Intramuscular Once Loura Halt, MD           Medication Allergies:  No Known Allergies    Social History:  Social History     Tobacco Use   Smoking Status Never Smoker   Smokeless Tobacco Never Used   Tobacco Comment    02/16/10       Physical Exam:  Vitals:    11/19/20 1259   BP:  131/74   Pulse: 54       General Appearance: well appearing, in no apparent distress  Ears: Pinna normal bilaterally, EAC clear bilaterally, TM clear bilaterally, hearing grossly intact  Anterior Rhinoscopy: Septum is deviated;  right.  Inferior turbinates with hypertrophy bilaterally.  No mucosal edema, thin mucus.   Oral cavity: Moist mucous membranes, tongue mobile, palate elevates symmetrically, no visible drainage in the posterior oropharynx      SINUS PROCEDURE NOTE - deferred today, exam below from last appointment   Procedure: Rigid nasal endoscopy  Pre-procedure diagnosis/Indication for procedure: To evaluate areas not seen on anterior rhinoscopy  Anesthesia: topical 1% phenylephrine & 4% lidocaine   Description: After verbal consent, a 30 degree rigid nasal endoscope was used to examine the left and right nasal cavities. The nasal valve areas were examined for abnormalities or collapse. The inferior and middle turbinates were evaluated. The middle and superior meatuses, the  sphenoethmoid recesses, and the nasopharynx were examined and inspected for mucopurulence and polyps. The patient tolerated the procedure without complications and was returned to ambulatory status.      Findings:  All structures appeared normal/unremarkable except as noted below    Bilateral inferior turbinate hypertrophy  Right septal deviation  Mild mucosal edema in the left middle meatus  Unable to visualize the right middle meatus secondary to septal deviation        Impression & Plan:         Nasal obstruction  Bilateral inferior turbinate hypertrophy  Septal deviation  Cough    Continue Astelin to 2 sprays twice daily  Continue Flonase 2 sprays twice daily  Follow-up in 6 months   Can consider omeprazole or asthma work-up in the future if cough is not resolved with above medical therapy   can consider surgery if nasal obstruction does not improve with medical therapy             Number and Complexity of Problems (Level  4)  During this encounter, I addressed 2 or more stable chronic illnesses    Amount and/or Complexity of Data Ordered, Reviewed, or Analyzed (Level 2)  I reviewed minimal or no data.  Risk of Complication and/or Morbidity or Mortality of Patient Management (Level  3)  The patient's management entails Low risk of complications and/or morbidity or mortality.    Overall LOS:  3          Attestation: in the case of resident participation in the care of this patient, I attest that I have seen and examined the patient, formulated the plan and agree with the documentation.      Lorretta Harp, MD  Division of Rhinology, Allergy, and Anterior Skull Base Surgery  Department of Otolaryngology--Head and Neck Surgery  [facility]  [facility]    CC:   MATTHEW Peter Garter, MD  Ladell Pier, MD    **This note was dictated with voice-recognition software**

## 2020-12-28 ENCOUNTER — Ambulatory Visit: Payer: MEDICARE | Attending: Neurology

## 2021-01-15 ENCOUNTER — Ambulatory Visit: Admit: 2021-01-15 | Payer: MEDICARE | Attending: Neurology

## 2021-01-15 DIAGNOSIS — G243 Spasmodic torticollis: Secondary | ICD-10-CM

## 2021-01-15 MED ORDER — abobotulin toxin A (DYSPORT) 300 unit injection
300 | INTRAMUSCULAR | Status: AC
Start: 2021-01-15 — End: 2021-01-15

## 2021-01-15 MED ORDER — abobotulinum toxin A (DYSPORT) SolR 1,000 Units
500 | Freq: Once | INTRAMUSCULAR | Status: AC
Start: 2021-01-15 — End: 2021-01-15
  Administered 2021-01-15: 21:00:00 1000 [IU] via INTRAMUSCULAR

## 2021-01-15 MED ORDER — abobotulinum toxin A (DYSPORT) 500 unit SolR
500 | INTRAMUSCULAR | Status: AC
Start: 2021-01-15 — End: 2021-01-15

## 2021-01-15 MED ORDER — gabapentin (NEURONTIN) 600 MG tablet
600 | ORAL_TABLET | Freq: Three times a day (TID) | ORAL | 5 refills | Status: AC
Start: 2021-01-15 — End: 2021-02-23

## 2021-01-15 MED FILL — DYSPORT 300 UNIT INTRAMUSCULAR SOLUTION: 300 300 unit | INTRAMUSCULAR | Qty: 1

## 2021-01-15 MED FILL — DYSPORT 500 UNIT INTRAMUSCULAR SOLUTION: 500 500 unit | INTRAMUSCULAR | Qty: 2

## 2021-01-15 NOTE — Unmapped (Signed)
Subjective:      Patient ID: Matthew [name] is a 75 y.o. male.    Movement Disorders HPI    Matthew [name] returned for his reinjection session with Dysport to address his left torticollis and mild left shoulder elevation. Response has been excellent. We again did not use EMG guidance into the right sternocleidomastoid, left splenius capitis, and left trapezius (painful area) because of the vasovagal syncope which complicated an old injection session.        Histories:     He has a past medical history of Environmental allergies, Neck symptom, and Sleep disorder.    He has a past surgical history that includes Hernia repair [phone]); Achilles tendon repair (1/04); and Knee arthroscopy (1976).    His family history includes Colon Cancer in his mother; Heart failure in his father.    He reports that he has never smoked. He has never used smokeless tobacco. He reports current alcohol use. He reports that he does not use drugs.    Review of Systems  Refer to HPI for review of systems documentation.    Allergies:   Patient has no known allergies.    Medications:     Outpatient Encounter Medications as of 01/15/2021   Medication Sig Dispense Refill   ??? ALPHA LIPOIC ACID ORAL Take by mouth.     ??? amLODIPine (NORVASC) 5 MG tablet Take 5 mg by mouth daily.     ??? aspirin 81 MG EC tablet Take 81 mg by mouth daily.     ??? azelastine (ASTELIN) 137 mcg (0.1 %) nasal spray Use 2 sprays into each nostril 2 times a day. 30 mL 6   ??? cetirizine (ZYRTEC) 10 MG tablet Take 1 tablet (10 mg total) by mouth daily. 30 tablet 11   ??? CETIRIZINE HCL (ZYRTEC ORAL) Take by mouth.     ??? doxycycline (VIBRAMYCIN) 50 MG capsule Take 20 mg by mouth.        ??? finasteride (PROSCAR) 5 mg tablet      ??? gabapentin (NEURONTIN) 600 MG tablet Take 1 tablet (600 mg total) by mouth 3 times a day. 90 tablet 5   ??? LUMIGAN 0.01 % Drop      ??? omega-3 acid ethyl esters (LOVAZA) 1 gram capsule Take 2 g by mouth daily.     ??? roPINIRole (REQUIP) 3 MG tablet  Take 1 tablet (3 mg total) by mouth at bedtime. 90 tablet 3     Facility-Administered Encounter Medications as of 01/15/2021   Medication Dose Route Frequency Provider Last Rate Last Admin   ??? abobotulinum toxin A (DYSPORT) SolR 500 Units  500 Units Intramuscular Once Loura Halt, MD       ??? abobotulinum toxin A (DYSPORT) SolR 500 Units  500 Units Intramuscular Once Loura Halt, MD           Objective:       Blood pressure 132/78, pulse 63, height 5' 11 (1.803 m), weight 184 lb 1.6 oz (83.5 kg), SpO2 99 %.    Botulinum Toxin Injection Summary  Was the time out preformed?: Yes  Botulinum toxin used: Dysport  Dilution (units/volume): 500:2  Total units used: 1000  Number of units discarded: 0     Muscle 1  Muscle 1: L Splenius capitus  Muscle 1 - Units: 375  Muscle 1 - # Sites: 6  Muscle 2  Muscle 2: R Sternocleidomastoid  Muscle 2 - Units: 125  Muscle 2 - # Sites: 2  Muscle  3  Muscle 3: L Trapezius  Muscle 3 - Units: 125  Muscle 3 - # Sites: 2  Muscle 4  Muscle 4:  (L Subscapular muscles)  Muscle 4 - Units: 375  Muscle 4 - # Sites: 6       Assessment:     1. Spasmodic torticollis       Non-EMG-guided injections with Dysport took were given to address his left torticollis and mild left shoulder elevation. The procedure was tolerated well.     Plan:     A four month reinjection was recommended again this time.

## 2021-01-15 NOTE — Unmapped (Signed)
Controlled Substance Monitoring 01/09/2020 07/24/2020 01/15/2021   OARRS/eKASPER Status Reviewed Reviewed Reviewed   OARRS/eKASPER Consistent with Prescriber Expectation Y Y -     Last Urine Drug Screen    No lab values to display.       Pain Agreement -- Encounter Level:    Pain Agreement: None found at the encounter level.     Pain Agreement -- Patient Level:    Pain Agreement: None found at the patient level.       I have completed the required OARRS/eKASPER documentation for this patient on 01/15/2021.  Matthew [name] A. Brennyn Haisley

## 2021-02-23 MED ORDER — gabapentin (NEURONTIN) 600 MG tablet
600 | ORAL_TABLET | Freq: Three times a day (TID) | ORAL | 1 refills | Status: AC
Start: 2021-02-23 — End: 2021-06-03

## 2021-02-23 NOTE — Unmapped (Signed)
Pt is calling back to follow up on the request for Gabapentin. Pt is concerned that the script will not be sent in time today. Pls follow up with the pt.

## 2021-02-23 NOTE — Unmapped (Signed)
Called [facility] in [place], [place]. Cancelled gabapentin script on file since patient needs to fill it in [place].

## 2021-02-23 NOTE — Unmapped (Signed)
Pt is calling to inform that he is in [place] and needs a script for [facility] to [facility] [place], [place]. Pt states he is out of meds today, pls send the script ASAP.       Ph [phone]

## 2021-02-23 NOTE — Unmapped (Signed)
Spoke to Matthew [name]. He confirmed he is taking 600 mg TID as prescribed. Denies asking if he should only take it daily. He is [facility] [place] [place].

## 2021-05-31 NOTE — Unmapped (Signed)
Opened in error.  Now closed

## 2021-06-03 NOTE — Unmapped (Addendum)
Received incoming fax from [facility] re: gabapentin.     Last Refilled: 02/23/2021  Last visit with Provider: 01/15/2021 Loura HaltAlberto Espay, MD  Next appointment in this department: 06/25/2021 Loura HaltAlberto Espay, MD        01/09/2020     8:34 AM 07/24/2020    12:58 PM 01/15/2021     4:19 PM 06/03/2021     4:38 PM   Controlled Substance Monitoring   OARRS/eKASPER Status Reviewed Reviewed Reviewed Reviewed   OARRS/eKASPER Consistent with Prescriber Expectation Y Y  Y     Last Urine Drug Screen    No lab values to display.       Pain Agreement -- Encounter Level:    Pain Agreement: None found at the encounter level.     Pain Agreement -- Patient Level:    Pain Agreement: None found at the patient level.       I have completed the required OARRS/eKASPER documentation for this patient on 06/03/2021.  [facility]    Per OARRS, 90-day supply was filled on 05/22/2021. Order pended with start date 90-days from 05/22/2021.

## 2021-06-07 MED ORDER — gabapentin (NEURONTIN) 600 MG tablet
600 | ORAL_TABLET | Freq: Three times a day (TID) | ORAL | 1 refills | Status: AC
Start: 2021-06-07 — End: 2022-02-14

## 2021-06-11 NOTE — Unmapped (Signed)
Pt spouse calling to get an appt with Dr.Espay for an eval.       [phone]

## 2021-06-17 NOTE — Unmapped (Signed)
Patient says he is having difficulty with his balance and other concerns and wants a office visit for that. Pt scheduled for Sept

## 2021-06-22 NOTE — Unmapped (Signed)
Appointment rescheduled:

## 2021-06-22 NOTE — Unmapped (Signed)
Aggie Cosierheresa returned call, 5/26 @ 9am is good - advised it will show in his Mychart.

## 2021-06-22 NOTE — Unmapped (Signed)
Called Mordecai Maes (wife). No answer. LVM to return call.     Call Center:  If spouse returns call, please offer an appointment at 0800 or 0900 on 06/25/2021

## 2021-06-22 NOTE — Unmapped (Signed)
PT's wife has a sx procedure Friday. Req that PT be seen earlier in the day. Please advise.

## 2021-06-25 ENCOUNTER — Ambulatory Visit: Payer: MEDICARE | Attending: Neurology

## 2021-06-25 ENCOUNTER — Ambulatory Visit: Admit: 2021-06-25 | Discharge: 2021-06-29 | Payer: MEDICARE | Attending: Neurology

## 2021-06-25 DIAGNOSIS — G243 Spasmodic torticollis: Secondary | ICD-10-CM

## 2021-06-25 MED ORDER — abobotulinum toxin A (DYSPORT) SolR 1,000 Units
500 | Freq: Once | INTRAMUSCULAR | Status: AC
Start: 2021-06-25 — End: 2021-06-25
  Administered 2021-06-25: 21:00:00 1000 [IU] via INTRAMUSCULAR

## 2021-06-25 MED ORDER — abobotulinum toxin A (DYSPORT) 500 unit SolR
500 | INTRAMUSCULAR | Status: AC
Start: 2021-06-25 — End: 2021-06-25

## 2021-06-25 MED FILL — DYSPORT 500 UNIT INTRAMUSCULAR SOLUTION: 500 500 unit | INTRAMUSCULAR | Qty: 2

## 2021-06-25 NOTE — Unmapped (Signed)
Addended by: Arlee Muslim on: 06/25/2021 05:47 PM     Modules accepted: Orders

## 2021-06-25 NOTE — Unmapped (Addendum)
It was a pleasure seeing you today in [facility].  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

## 2021-06-25 NOTE — Unmapped (Signed)
UC Email  *This email is from a [facility] sender* [facility] Email   Security Reminder: Links or attachments may still be threats if the user's account is compromised. If you believe this email is not legitimate, contact the [employer] Service Desk.   Sure, Nadine CountsBob. Let me ask Jeanice LimHolly to enter that for my signature.     Good having seen you today,  Eulis FosterAlberto    From: Dionicio Stallobert J. Portner [email or web address]  Date: Friday, Jun 25, 2021 at 1:09 PM  To: Espay, Alberto ([email or web address]) [email or web address]  Subject: [facility]  External Email: Use Caution      Hi Dr,  Would you be able to write me a prescription for physical therapy at [facility]? Thanks,  Chase CallerBob    Sent from my iPhone

## 2021-06-25 NOTE — Unmapped (Signed)
Subjective:      Patient ID: Matthew [name] is a 76 y.o. male.    Patient seen in consultation at the request of: self  Handedness: Right  Chief Complaint: balance issues  Patient accompanied by and additional history obtained from: wife, Matthew [name]  Age at onset of symptoms: 31  Duration of symptoms: 44 years    Movement Disorders HPI    Matthew [name] returned for his reinjection session with Dysport to address his left torticollis and mild left shoulder elevation. Response has been excellent. We again did not use EMG guidance into the right sternocleidomastoid, left splenius capitis, and left trapezius (painful area) because of the vasovagal syncope which complicated an old injection session.    In addition, he reports recent-onset of imbalance (while playing tennis), since the last two years or so.    He is a retired Pensions consultant, doing occasionally some counseling. He is married, one daughter (74) and one son (5). No other family members affected by neurologic diseases. He works out about 5 times a week.    Initial history (2012): Matthew [name] was seen in consultation for the evaluation of cervical movements and shoulder pain. He has noted a tendency for the neck to pull to the left over the last year, more consistently for the last six months. 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 Stress may make these events more common. He has noted that touching the face completely controls these movements although his wife finds that disrupting. He spoke of an urge but questioning did not reveal that these movements were truly voluntary. He has also had left shoulder blade pain, which he attributes to the need to try to keep his head in mid position, which is hard without using his hands. Additionally, he has had symptoms of  regular movements of legs and arms through the night, which do not seem to represent dream enactment behaviors. Treatment with mirapex and ambien only caused excessive sedation. Clonazepam is helping with his sleep and a bit with his posturing.    He has been diagnosed with peripheral neuropathy since the late 30s. 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 Also, he reports having chronic cough since his 77s, for which he has been seen by multiple ENT doctors. In 1975 he had severe dizziness, spontaneously resolved. Then, he had episodic clusters of vertigo for about one year.          Quality Metrics  Falls (Review Each Visit): present  2 (due to iatrogenic left leg atrophy)  Motor Complications (Review Each Visit): denies     Depression (Review Annually): denies     Apathy (Review Annually): denies     Anxiety (Review Annually): denies     Cognitive Impairment (Review Annually): denies      Autonomic Dysfunction (Review Annually): present  [place]  Insomnia/Sleep Disturbance (Review Annually): present  sleep initiation    Other Symptoms  Excessive Sweating: denies     Urinary Incontinence: denies     Erectile Dysfunction: present  on tadalafil  Dysphagia: denies     Dream Enactment Behavior: present  RBD  Anosmia: present     Constipation: denies     Hypophonia: denies  Hypomimia: denies     Micrographia: denies        Med Side Effects  Impulse Control Disorder: denies       Imaging       Etiol Risk     Histories:     Past Medical History:   Diagnosis Date   ??? Environmental allergies    ??? Hearing loss     left (since age 62), right more recent   ??? Hypertension    ??? Neck symptom     Trouble turning head to the left    ??? Sleep disorder      Past Surgical History:   Procedure Laterality Date   ??? ACHILLES TENDON REPAIR  1/04    left    ??? HERNIA REPAIR  1953   ??? KNEE  ARTHROSCOPY  1976    left      Family History   Problem Relation Age of Onset   ??? Colon Cancer Mother    ??? Heart failure Father      Social History     Socioeconomic History   ??? Marital status: Married     Spouse name: Not on file   ??? Number of children: Not on file   ??? Years of education: Not on file   ??? Highest education level: Not on file   Occupational History   ??? Not on file   Tobacco Use   ??? Smoking status: Never   ??? Smokeless tobacco: Never   ??? Tobacco comments:     02/16/10   Substance and Sexual Activity   ??? Alcohol use: Yes     Comment: 3-4/wk 02/16/10   ??? Drug use: No     Comment: 02/16/10   ??? Sexual activity: Not on file   Other Topics Concern   ??? Caffeine Use Yes   ??? Occupational Exposure Not Asked   ??? Exercise Yes   ??? Seat Belt Yes   Social History Narrative   ??? Not on file     Social Determinants of Health     Financial Resource Strain: Not on file   Physical Activity: Not on file   Stress: Not on file   Social Connections: Not on file   Housing Stability: Not on file     Review of Systems   HENT: Positive for hearing loss and trouble swallowing.    Respiratory: Positive for cough.    Musculoskeletal: Positive for musculoskeletal pain, arthralgias and joint swelling.   Psychiatric/Behavioral: Positive for sleep disturbance.    Neurological: Positive for dizziness and numbness.     Refer to HPI for review of systems documentation.  See scanned patient data form for additional ROS information.  ROS was otherwise non-contributory.     Allergies:   Patient has no known allergies.    Medications:     Outpatient Encounter Medications as of 06/25/2021   Medication Sig Dispense Refill   ??? ALPHA LIPOIC ACID ORAL Take by mouth.     ??? amLODIPine (NORVASC) 5 MG tablet Take 1 tablet (5 mg total) by mouth daily.     ??? aspirin 81 MG EC tablet Take 1 tablet (81 mg total) by mouth daily.     ??? azelastine (ASTELIN) 137 mcg (0.1 %) nasal spray Use 2 sprays into each nostril 2 times a day. 30 mL 6   ??? cetirizine (ZYRTEC) 10  MG tablet Take 1 tablet (10 mg total) by mouth daily. 30 tablet 11   ??? CETIRIZINE HCL (  ZYRTEC ORAL) Take by mouth.     ??? finasteride (PROSCAR) 5 mg tablet      ??? [START ON 08/18/2021] gabapentin (NEURONTIN) 600 MG tablet Take 1 tablet (600 mg total) by mouth 3 times a day. 270 tablet 1   ??? LUMIGAN 0.01 % Drop      ??? omega-3 acid ethyl esters (LOVAZA) 1 gram capsule Take 2 capsules (2 g total) by mouth daily.     ??? timoloL (BETIMOL) 0.5 % ophthalmic solution 1 drop 2 times a day.     ??? zolpidem (AMBIEN CR) 6.25 MG Take 10 mg by mouth at bedtime as needed for Sleep.     ??? brimonidine (ALPHAGAN) 0.2 % ophthalmic solution Place 1 drop into the right eye 2 times a day.     ??? doxycycline (VIBRAMYCIN) 50 MG capsule Take 20 mg by mouth.        ??? [DISCONTINUED] roPINIRole (REQUIP) 3 MG tablet Take 1 tablet (3 mg total) by mouth at bedtime. 90 tablet 3     Facility-Administered Encounter Medications as of 06/25/2021   Medication Dose Route Frequency Provider Last Rate Last Admin   ??? abobotulinum toxin A (DYSPORT) 500 unit SolR            ??? abobotulinum toxin A (DYSPORT) SolR 500 Units  500 Units Intramuscular Once Loura Halt, MD       ??? abobotulinum toxin A (DYSPORT) SolR 500 Units  500 Units Intramuscular Once Loura Halt, MD         Objective:         Vitals:    06/25/21 0847   BP: 117/74   BP Location: Left arm   Patient Position: Sitting   BP Cuff Size: Regular   Weight: 179 lb 12.8 oz (81.6 kg)   Height: 5' 11 (1.803 m)       Physical Exam  Constitutional:       Appearance: Normal appearance. He is normal weight.   HENT:      Head: Normocephalic and atraumatic.      Mouth/Throat:      Pharynx: Oropharynx is clear.   Eyes:      General: Lids are normal.      Extraocular Movements: Extraocular movements intact.      Conjunctiva/sclera: Conjunctivae normal.      Pupils: Pupils are equal, round, and reactive to light.   Cardiovascular:      Rate and Rhythm: Normal rate and regular rhythm.      Pulses: Normal pulses.       Heart sounds: Normal heart sounds.   Abdominal:      General: Abdomen is flat.   Musculoskeletal:      Cervical back: Normal range of motion.   Skin:     General: Skin is warm.   Neurological:      Mental Status: He is alert.      Coordination: Romberg sign positive.      Deep Tendon Reflexes:      Reflex Scores:       Tricep reflexes are 2+ on the right side and 2+ on the left side.       Bicep reflexes are 2+ on the right side and 2+ on the left side.       Brachioradialis reflexes are 2+ on the right side and 2+ on the left side.       Patellar reflexes are 0 on the right side and 0 on the left side.  Achilles reflexes are 0 on the right side and 0 on the left side.  Psychiatric:         Speech: Speech normal.         Neurological Exam  Mental Status  Alert. Oriented to person, place, time and situation. Recent and remote memory are intact. Speech is normal. Language is fluent with no aphasia. Attention and concentration are normal.    Cranial Nerves  CN II: Visual acuity is normal.  CN III, IV, VI: Extraocular movements intact bilaterally. Normal lids and orbits bilaterally. Pupils equal round and reactive to light bilaterally.  CN V: Facial sensation is normal.  CN VII: Full and symmetric facial movement.  CN VIII: Hearing is normal.  CN IX, X: Palate elevates symmetrically  CN XI: Shoulder shrug strength is normal.  CN XII: Tongue midline without atrophy or fasciculations.    Motor  Normal muscle bulk throughout. No fasciculations present. Normal muscle tone. No abnormal involuntary movements. Strength is 5/5 in all four extremities except as noted.                                             Right                     Left  Knee extension                                              5-    Sensory  Light touch is normal in upper and lower extremities. Pinprick abnormality: Temperature is normal in upper and lower extremities. Vibration abnormality: Proprioception is normal in upper and lower extremities.    Reduced vibration sensation from the knees and downwards, bilaterally.  Abnormal pinprick in all limbs..    Reflexes                                            Right                      Left  Brachioradialis                    2+                         2+  Biceps                                 2+                         2+  Triceps                                2+                         2+  Finger flex  0                         0  Patellar                                0                         0  Achilles                                0                         0  Plantar                           Downgoing                Downgoing  Jaw jerk absent.  Right pathological reflexes: Hoffmann's absent. Suprapatellar absent. Crossed adductor absent. Ankle clonus absent.  Left pathological reflexes: Hoffmann's absent. Suprapatellar absent. Crossed adductor absent. Ankle clonus absent.  Glabellar tap absent.    Coordination  Right: Finger-to-nose normal. Rapid alternating movement normal. Heel-to-shin normal.Left: Finger-to-nose normal. Rapid alternating movement normal. Heel-to-shin normal.    Gait  Casual gait is normal including stance, stride, and arm swing.Normal toe walking. Normal heel walking. Tandem gait abnormality: Romberg is present. Normal pull test. Able to rise from chair without using arms.  Tndem gait impossible..    Normal EOM. Horizontal nystagmus, saccadic pursuits, square wave jerks. Vestibular areflexia (L>R). No supranuclear gaze abnormalities.     Blood pressure 117/74, height 5' 11 (1.803 m), weight 179 lb 12.8 oz (81.6 kg).    Botulinum Toxin Injection Summary  Was the time out preformed?: Yes  Botulinum toxin used: Dysport  Dilution (units/volume): 500:2  Total units used: 1000  Number of units discarded: 0     Muscle 1  Muscle 1: L Splenius capitus  Muscle 1 - Units: 375  Muscle 1 - # Sites: 6  Muscle 2  Muscle 2: R Sternocleidomastoid  Muscle 2 - Units:  125  Muscle 2 - # Sites: 2  Muscle 3  Muscle 3: L Trapezius  Muscle 3 - Units: 125  Muscle 3 - # Sites: 2  Muscle 4  Muscle 4:  (L Subscapular muscles)  Muscle 4 - Units: 375  Muscle 4 - # Sites: 6       Assessment:     1. Spasmodic torticollis    2. Cerebellar ataxia (CMS-HCC)    3. Neuropathy    4. Chronic cough       Non-EMG-guided injections with Dysport took were given to address his left torticollis and mild left shoulder elevation. The procedure was tolerated well.     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 RLS and PLM of sleep, may warrant further evaluation in the future for IGLON-5 disease. His most bothersome symptom today is     Plan:     For cervical dystonia a four month reinjection was  recommended again this time.     For ataxia, neuropathy, vestibular features:  - Request brain MRI to see if cerebellar atrophy is present  - ENT evaluation for vestibular areflexia  - Genetic testing for CANVAS through commercial institutions or [facility] (We will inform the patient accordingly).   - Consider IGLON-5 disease for associated coughing and sleep disorders in the future, if CANVAS is negative.    Arlee MuslimLuca Marsili, MD, PhD  Movement Disorders Fellow    I saw and examined this patient on 06/25/2021. I performed key/critical portions of the history and physical examination along with Dr. Matthew FolksMarsili. The note above has been written by Dr. Matthew FolksMarsili but edited as needed by me. I also conducted the injection with Dysport for his dystonia.    Portions of this note have been copied forward but reviewed in their entirety and are accurate to date. I discussed with Dr. Matthew FolksMarsili and with the patient and agree with the  findings and plan as documented in the note.      Bridgett LarssonAlberto J Kolton Kienle, MD, MSc

## 2021-06-29 NOTE — Unmapped (Signed)
Pt called to sched appt for cerebellar ataxia- unsure which provider would be best for this. Please advise.

## 2021-07-09 NOTE — Unmapped (Signed)
Patient is following up on MRI orders and patient states [facility] was supposed to have been sent      Please advise   [phone]

## 2021-07-09 NOTE — Unmapped (Addendum)
PT referral, office notes, demographic sheet, and insurance cards faxed to the [facility] at [phone]. Received confirmation of successful fax transmission.    [place] to [place]. Provided the number [phone] to call to schedule his MRI.

## 2021-07-12 ENCOUNTER — Inpatient Hospital Stay: Admit: 2021-07-12 | Payer: MEDICARE

## 2021-07-12 DIAGNOSIS — G119 Hereditary ataxia, unspecified: Secondary | ICD-10-CM

## 2021-07-14 NOTE — Unmapped (Signed)
Called patient to discuss brain MRI findings. Given the finding of suspected retrolisthesis and disc bulge at C3-4 on localizer images, we opted for proceeding with further evaluation with cervical spine MRI (requested today).  Arlee Muslim, MD, PhD  Movement Disorders Fellow

## 2021-08-05 ENCOUNTER — Ambulatory Visit: Admit: 2021-08-05 | Discharge: 2021-08-05 | Payer: MEDICARE

## 2021-08-05 ENCOUNTER — Ambulatory Visit: Admit: 2021-08-05 | Discharge: 2021-08-05 | Payer: MEDICARE | Attending: Audiologist

## 2021-08-05 DIAGNOSIS — G119 Hereditary ataxia, unspecified: Secondary | ICD-10-CM

## 2021-08-05 DIAGNOSIS — R2689 Other abnormalities of gait and mobility: Secondary | ICD-10-CM

## 2021-08-05 NOTE — Unmapped (Signed)
Vitals:    08/05/21 1307   BP: 139/81   BP Location: Left arm   Patient Position: Sitting   BP Cuff Size: Regular   Pulse: 62   SpO2: 96%   Weight: 180 lb 4.8 oz (81.8 kg)       Chief Complaint   Patient presents with    Dizziness     Per pt describes feeling as more of an imbalance       Cancer Staging:   Cancer Staging   No matching staging information was found for the patient.      History of Present Illness:  Patient ID: Matthew [name] is a 76 y.o. male presents to the clinic for evaluation of imbalance.   Patient reports worsening imbalance over the past year. He notices that when he walks or runs he will experience a sense of imbalance. States that things seem to be moving and certain motions like looking up seem to make this worse. Patient does not feel like he is going to fall, but feels off kilter. [facility] and went consistently until December when he took a trip to [place]. Yesterday patient started back up with balance PT. Patient states he is active and play tennis.     Patient has history of hearing loss in the left ear since he was in 7/8th grade. Patient had episode of spinning vertigo in his 27s. 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 This workup was negative. He has not had any episodes of vertigo since.   Dr Waunita Schooner wants to do genetic testing for CANVAS.     Prior medications used for this:   Family history of hearing loss:   Hearing loss: yes  History of noise exposure:   Any ototoxic medications:   Tinnitus:   Pulsatile tinnitus:   Otorrhea: no  Aural fullness: no  Otalgia: no  Seasonal allergies:   History of ear infections:   Prior history of ear surgery: no  Headache/migraines: no  FHx Migraines:   Vision changes (flashing lights, spots, double vision):   Recent eye exam:   History of trauma to ear/head:   Prior CT/MRI: yes      Past Medical History:  He has a past medical history of Environmental  allergies, Hearing loss, Hypertension, Neck symptom, and Sleep disorder.  Past Surgical History:  He has a past surgical history that includes Hernia repair [phone]); Achilles tendon repair (1/04); and Knee arthroscopy (1976).  Social History:  He reports that he has never smoked. He has never used smokeless tobacco. He reports current alcohol use. He reports that he does not use drugs.    Medications:  Current Outpatient Medications   Medication Sig Dispense Refill    ALPHA LIPOIC ACID ORAL Take by mouth.      amLODIPine (NORVASC) 5 MG tablet Take 1 tablet (5 mg total) by mouth daily.      azelastine (ASTELIN) 137 mcg (0.1 %) nasal spray Use 2 sprays into each nostril 2 times a day. 30 mL 6    brimonidine (ALPHAGAN) 0.2 % ophthalmic solution Place 1 drop into the right eye 2 times a day.      cetirizine (ZYRTEC) 10 MG tablet Take 1 tablet (10 mg total) by mouth daily. 30 tablet 11    CETIRIZINE HCL (ZYRTEC ORAL) Take by mouth.      finasteride (PROSCAR) 5 mg tablet       [  START ON 08/18/2021] gabapentin (NEURONTIN) 600 MG tablet Take 1 tablet (600 mg total) by mouth 3 times a day. 270 tablet 1    LUMIGAN 0.01 % Drop       omega-3 acid ethyl esters (LOVAZA) 1 gram capsule Take 2 capsules (2 g total) by mouth daily.      timoloL (BETIMOL) 0.5 % ophthalmic solution 1 drop 2 times a day.      zolpidem (AMBIEN CR) 6.25 MG Take 10 mg by mouth at bedtime as needed for Sleep.       Current Facility-Administered Medications   Medication Dose Route Frequency Provider Last Rate Last Admin    abobotulinum toxin A (DYSPORT) SolR 500 Units  500 Units Intramuscular Once Loura Halt, MD        abobotulinum toxin A (DYSPORT) SolR 500 Units  500 Units Intramuscular Once Loura Halt, MD           Allergies:  Patient has no known allergies.    Physical Exam:  General:  Well-developed, well-nourished male in no acute distress.  I was able to converse well with the patient.  The patient is able to answer questions adequately and  appropriately.  Psychiatric:  NL mood and affect.  A+O x 4.  Neck: Overall appearance NL.   Head and Face: AT-NC.   Eye:  Ocular motility NL.  No erythema or infection.   No nystagmus evoked upon horizontal pursuit.   Normal convergence.   Nose:  External nose without infection or abnormality.    OC/OP:  Lips are symmetric and in good condition.   Skin:  Visual examination of the scalp, neck, and face reveals no significant rashes, ulcerations, nodules, or lesions.  Ears:  External ears have a normal appearance with no masses, lesions, or scars.    Cardiovascular:  Warm and well perfused.  Respiratory: normal effort, no acute distress.   Neurologic:  Facial nerve function grade I.  Other cranial nerves grossly WNL.    Head-Thrust negative. Romberg positive. Fukuda positive right.    Procedure Note:  Otomicroscopy:   Right: other than wax, EAC normal; TM intact, flat, mobile, light reflex in the anterior-inferior quadrant; middle ear space clear.  Left: other than wax, EAC normal; TM intact, flat, mobile, light reflex in the anterior-inferior quadrant; middle ear space clear.    Testing:  Audiogram 08/05/2021:  AD: normal to mod-severe SNHL; WRS 96%; SRT 20 dB; Type A tymps  AS: severe SNHL; WRS 16*%; VAL 75 dB (60 dB of masking); type A tymps    Radiology:  - MRI Head WO contrast    Result Date: 07/13/2021  IMPRESSION: 1.  No acute abnormality. 2.  Mild chronic small vessel ischemic disease. 3.  No significant atrophy of the brainstem or cerebellum. 4.  Suspected retrolisthesis and disc bulge at C3-4 on localizer images. Consider further evaluation with cervical spine MRI as clinically indicated. Approved by [name], MD on 07/12/2021 4:29 PM EDT I have personally reviewed the images and I agree with this report. Report Verified by: [name], MD at 07/13/2021 9:46 AM EDT   My Impression:    Assessment & Plan:  Dear Dr. [name],     76 y.o. White or Caucasian male  1. Imbalance        2. Sensorineural  hearing loss (SNHL) of both ears        3. Asymmetrical sensorineural hearing loss          - Audiogram: asymmetric hearing  loss of left ear.   -Encourage at least 30-60 minutes a day of aerobic anaerobic exercise, which can be broken up into separate time intervals for overall, balance, and cardiac health. Ex: Yoga, Tai Chi, golfing, tennis, walking, water aerobics, etc. The healthier the body as a whole, the greater the ease of compensation and recovery from imbalance and dizziness.   -Recommend patient continue balance physical therapy to improve imbalance.   -Discussed that from an ENT perspective, there is no medication or surgery that I can offer to to help. Discussed that I could do further testing, however those results would not change my recommendations. Discussed importance of retraining the brain and creating compensations with balance physical therapy.   -Follow up if symptoms worsen or for any new/worsening ENT concerns.      Activation code not generated  Current My [facility] Status: Active       LOS:   Number and Complexity of Problems Addressed  1 acute, uncomplicated illness or injury    Amount and/or Complexity of Data to be Reviewed and Analyzed  2 review of prior external notes from each unique source  1 review of the result of each unique test    Risk of Complications and/or Morbidity or Mortality of Patient Management  Minimal        Patient Education:   * verbal information and/or pamphlet/ literature given to patient.   Risks & Benefits:   * Risks, benefits and treatment options discussed with patient.

## 2021-08-05 NOTE — Unmapped (Signed)
Matthew [name] was seen today for Audiometric Evaluation.  See scanned test results.

## 2021-08-12 ENCOUNTER — Other Ambulatory Visit: Admit: 2021-08-12 | Payer: MEDICARE

## 2021-08-12 ENCOUNTER — Ambulatory Visit: Admit: 2021-08-12 | Payer: MEDICARE

## 2021-08-12 DIAGNOSIS — Z006 Encounter for examination for normal comparison and control in clinical research program: Secondary | ICD-10-CM

## 2021-08-12 NOTE — Unmapped (Signed)
Matthew [name] was seen today to discuss his potential participation in the study Genetic underpinnings of tremor disorders. Matthew [name] and Movement Disorders Research Scholar Chin[place]pa reviewed the most recent approved consent form. He was given ample time to review the consent form and ask questions. When he felt all his?inquiries?were adequately answered, he verbalized he would like to participate in the study and signed the consent form. A paper copy of the consent was provided to Matthew [name].  ?   After the consent was signed, IRB-approved study-related activities were performed without incident. Blood sample was collected. Please, see the study binder for details. He was encouraged to call or email [place] if any questions arise regarding his participation in the study.   ?   Matthew Bowl, MD   Movement Disorders Research Scholar

## 2021-08-17 ENCOUNTER — Inpatient Hospital Stay: Admit: 2021-08-17 | Payer: MEDICARE

## 2021-08-17 DIAGNOSIS — M5090 Cervical disc disorder, unspecified, unspecified cervical region: Secondary | ICD-10-CM

## 2021-08-19 NOTE — Unmapped (Signed)
Revised cervical  MRI scans (1. 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 Severe left C4 foraminal stenosis) and put referral with neurosurgery for consultation (outpatient) Dr. Sena Hitch.  Sent message to patient via MyChart.  Matthew Muslim, MD, PhD  Movement Disorders Fellow\

## 2021-08-20 NOTE — Unmapped (Signed)
NPV scheduled more than 2 weeks out. Please advise.

## 2021-09-08 ENCOUNTER — Ambulatory Visit: Admit: 2021-09-08 | Discharge: 2021-09-13 | Payer: MEDICARE

## 2021-09-08 DIAGNOSIS — M5020 Other cervical disc displacement, unspecified cervical region: Secondary | ICD-10-CM

## 2021-09-08 NOTE — Unmapped (Signed)
Neurosurgery Spine Clinic Note    Chief Complaint:    Chief Complaint   Patient presents with    New Patient Visit/ Consultation       History of Present Illness:  76 y.o. male with history of HTN and peripheral neuropathy who presents upon referral for evaluation of worsening imbalance. 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 He denies any difficulty with swallowing, however, notes cough triggered with swallowing. He has been working with therapy for imbalance. 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 He reports that his ADL's are limited with his imbalance. The patient is planning to move to [place], [place] in October.           Problem List:  Patient Active Problem List   Diagnosis    Spasmodic torticollis    Cerebellar ataxia (CMS-HCC)    Neuropathy    Chronic cough       Meds:     Current Outpatient Medications:     ALPHA LIPOIC ACID ORAL, Take by mouth., Disp: , Rfl:     amLODIPine (NORVASC) 5 MG tablet, Take 1 tablet (5 mg total) by mouth daily., Disp: , Rfl:     azelastine (ASTELIN) 137 mcg (0.1 %) nasal spray, Use 2 sprays into each nostril 2 times a day., Disp: 30 mL, Rfl: 6    brimonidine (ALPHAGAN) 0.2 % ophthalmic solution, Place 1 drop into the right eye 2 times a day., Disp: , Rfl:     cetirizine (ZYRTEC) 10 MG tablet, Take 1 tablet (10 mg total) by mouth daily., Disp: 30 tablet, Rfl: 11    CETIRIZINE HCL (ZYRTEC ORAL), Take by mouth., Disp: , Rfl:     finasteride (PROSCAR) 5 mg tablet, , Disp: , Rfl:     gabapentin (NEURONTIN) 600 MG tablet, Take 1 tablet (600 mg total) by mouth 3 times a day., Disp: 270 tablet, Rfl: 1    LUMIGAN 0.01 % Drop, , Disp: , Rfl:     omega-3 acid ethyl esters (LOVAZA) 1 gram capsule, Take 2 capsules (2 g total) by mouth daily., Disp: , Rfl:     timoloL (BETIMOL) 0.5 % ophthalmic solution, 1 drop 2 times a day., Disp: , Rfl:     zolpidem (AMBIEN CR) 6.25 MG, Take 10 mg by mouth at bedtime as needed for Sleep., Disp: , Rfl:     Current  Facility-Administered Medications:     abobotulinum toxin A (DYSPORT) SolR 500 Units, 500 Units, Intramuscular, Once, Loura Halt, MD    abobotulinum toxin A (DYSPORT) SolR 500 Units, 500 Units, Intramuscular, Once, Loura Halt, MD    Allergies:   Allergies   Allergen Reactions    Animal Dander      Causes congestion    Adhesive Tape-Silicones Rash       PMH:  Past Medical History:   Diagnosis Date    Environmental allergies     Hearing loss     left (since age 47), right more recent    Hypertension     Neck symptom     Trouble turning head to the left     Sleep disorder        PSH:  Past Surgical History:   Procedure Laterality Date    ACHILLES TENDON REPAIR  1/04    left     HERNIA REPAIR  1953    KNEE ARTHROSCOPY  1976    left        PFH:  Family History  Problem Relation Age of Onset    Colon Cancer Mother     Heart failure Father        SH:  Social History     Socioeconomic History    Marital status: Married     Spouse name: Not on file    Number of children: Not on file    Years of education: Not on file    Highest education level: Not on file   Occupational History    Not on file   Tobacco Use    Smoking status: Never    Smokeless tobacco: Never    Tobacco comments:     02/16/10   Substance and Sexual Activity    Alcohol use: Yes     Comment: 3-4/wk 02/16/10    Drug use: No     Comment: 02/16/10    Sexual activity: Not on file   Other Topics Concern    Caffeine Use Yes    Occupational Exposure Not Asked    Exercise Yes    Seat Belt Yes   Social History Narrative    Not on file     Social Determinants of Health     Financial Resource Strain: Not on file   Physical Activity: Not on file   Stress: Not on file   Social Connections: Not on file   Housing Stability: Not on file       ROS:  Review of systems: The patients presenting symptoms and review of symptoms are as noted in the history and physical and also as documented in the Epic chart. All other systems are reviewed and found noncontributory.    Physical  Exam:   Gen: well developed, well nourished, NAD, A&O   HEENT: normocephalic, atraumatic  Neck: supple  Chest: symmetric chest rise, non-labored breathing  CV: RRR  Abd: Soft, NT, ND  Spine:   MSK: RUE: Strength 5/5 d/b/t/wf/we/grip/IO, sensory intact   LUE:  5/5 d/b/t/wf/we/grip/IO, sensory intact   RLE: 5/5 ehl/ta/gs/quads/hams/hip flexors, sensory intact   LLE: 5/5 ehl/ta/gs/quads/hams/hip flexors, sensory intact  Gait ataxia  Negative hoffman's    Today's body mass index is 25.02 kg/m. BMI is normal.    Imaging:       Results for orders placed during the hospital encounter of 08/17/21    MRI CERVICAL SPINE WITHOUT CONTRAST    Impression  1.  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 Severe left C4 foraminal stenosis.  2.  Degenerative changes other levels without additional cord compression. Mild to moderate foraminal narrowing as described.    Approved by Joselyn Glassman, MD on 08/18/2021 8:45 AM EDT    I have personally reviewed the images and I agree with this report.    Report Verified by: Larey Dresser, MD at 08/18/2021 10:00 AM EDT    Signed by: Conley Canal, MD on 08/18/2021 10:00 AM    MRI head 07/12/21    IMPRESSION:     1.  No acute abnormality.   2.  Mild chronic small vessel ischemic disease.   3.  No significant atrophy of the brainstem or cerebellum.   4.  Suspected retrolisthesis and disc bulge at C3-4 on localizer images. Consider further evaluation with cervical spine MRI as clinically indicated.     Assessment/Plan:   Matthew [name] is a 76 y.o. male with history of HTN and peripheral neuropathy who presents upon referral for evaluation of worsening imbalance. 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 He denies any difficulty with swallowing, however, notes cough triggered with swallowing. He has been working with therapy for imbalance. He has been told he has  CANVAS. Review of MRI C-spine from 08/17/21 demonstrates C3-C4 central stenosis with anteriorly projecting bone spur. Review of MRI brain from 07/12/21 demonstrates no acute abnormality.    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    - I discussed with the patient that it is reasonable to consider surgical intervention with C3-C4 ACDF to address bone spur and cervical stenosis in the setting of his ataxia and coughing fits after swallowing. I discussed with the patient that this operation is elective and the patient may choose to pursue at his leisure.    - I discussed with the patient the importance of good ergonomics and activity modification to avoid jarring impact.    - We discussed the surgery needed at length, which I would include ENT on given the dysphagia and anterior osteophyte by the epiglottis.  However, he notes that he is moving to [facility] permanently in October 2023, and wishes to have this surgery done there once he is settled.  The patient may follow up as needed and may call us for any new neurological concerns until he is able to get established with a neurosurgeon there in [place].    Answered all of the patient's questions to his/her satisfaction and understanding, and he is in agreement with the plan.     Scribe Attestation  By signing my name below, I, Berenda Morale, attest that this documentation has been prepared under the direction and in the presence of Denyse Dago, MD   Scribe name: Berenda Morale Date: 09/08/2021  ----  Attending Physician Attestation     I have seen and examined this patient, and reviewed this document. The scribes documentation has been prepared under my direction and personally reviewed by me in its entirety. I confirm that the note above accurately reflects all work, treatment, procedures,  and medical decision making performed by me.      Denyse Dago, MD   6:22 PM, 09/12/2021        Number and Complexity of Problems (Level 5)  During this encounter, I addressed 1 or more chronic illnesses with severe exacerbation, progression, or side effects of treatment      Amount and/or Complexity of Data Ordered, Reviewed, or Analyzed (Level 4)  I reviewed 2 test(s) including:MRI C-spine 08/17/21, MRI head 07/12/21.  I independently interpreted the following test(s): MRI C-spine 08/17/21, MRI head 07/12/21.      Risk of Complication and/or Morbidity or Mortality of Patient Management (Level  3)  The patient's management entails Low risk of complications and/or morbidity or mortality.    Overall LOS:  4

## 2021-09-08 NOTE — Unmapped (Signed)
An After Visit Summary was printed and given to the patient.    Please call [phone] when ready for follow up

## 2021-09-10 NOTE — Unmapped (Signed)
Pt calling  He would like to get scheduled for surgery    Pt [phone]

## 2021-09-10 NOTE — Unmapped (Signed)
Called patient and LVM letting him know I am waiting for the SRF from Dr.Cheng and once I get that I will reach out to him and get him on the schedule.

## 2021-09-15 NOTE — Unmapped (Signed)
I still have not received a SRF for this patient from the surgeon, Dr.Cheng assured me he would get one to me asap so that I am able to schedule.

## 2021-09-15 NOTE — Unmapped (Signed)
Pt is calling to follow up on scheduling surgery.

## 2021-09-17 ENCOUNTER — Ambulatory Visit: Payer: MEDICARE | Attending: Neurology

## 2021-09-17 ENCOUNTER — Other Ambulatory Visit: Admit: 2021-09-17 | Payer: MEDICARE

## 2021-09-17 ENCOUNTER — Ambulatory Visit: Admit: 2021-09-17 | Payer: MEDICARE | Attending: Neurology

## 2021-09-17 DIAGNOSIS — G243 Spasmodic torticollis: Secondary | ICD-10-CM

## 2021-09-17 LAB — ENCEPHALOPATHY PANEL, SERUM
AGNA 1: NEGATIVE
AMPA-R Ab CBA, Serum: NEGATIVE
ANNA-1: NEGATIVE
ANNA-2: NEGATIVE
ANNA-3: NEGATIVE
Amphiphysin Ab: NEGATIVE
CASPR2-IgG CBA, S: NEGATIVE
CRMP-5-IgG: NEGATIVE
DPPX Ab IFA, S: NEGATIVE
GABA-B-R Ab CBA, Serum: NEGATIVE
GAD65 Antibody: 0.04 nmol/L — ABNORMAL HIGH (ref <=0.02)
GFAP IFA, S: NEGATIVE
IgLON5 IFA, S: NEGATIVE
LGI1-IgG CBA, S: NEGATIVE
NIF IFA, S: NEGATIVE
NMDA-R Ab CBA, Serum: NEGATIVE
Neurochondrin IFA (ENCES): NEGATIVE
PCA-1, Serum: NEGATIVE
PCA-2, Serum: NEGATIVE
PCA-Tr, Serum: NEGATIVE
Septin-7 IF (ENCES): NEGATIVE
mGluR1 Ab IFA, S: NEGATIVE

## 2021-09-17 MED ORDER — abobotulinum toxin A (DYSPORT) 500 unit SolR
500 | INTRAMUSCULAR
Start: 2021-09-17 — End: 2021-09-17

## 2021-09-17 MED ORDER — abobotulinum toxin A (DYSPORT) SolR 1,000 Units
500 | Freq: Once | INTRAMUSCULAR | Status: AC
Start: 2021-09-17 — End: 2021-09-17
  Administered 2021-09-17: 19:00:00 1000 [IU] via INTRAMUSCULAR

## 2021-09-17 MED FILL — DYSPORT 500 UNIT INTRAMUSCULAR SOLUTION: 500 500 unit | INTRAMUSCULAR | Qty: 2

## 2021-09-17 NOTE — Unmapped (Signed)
Addended by: Arlee Muslim on: 09/17/2021 03:27 PM     Modules accepted: Orders

## 2021-09-17 NOTE — Unmapped (Addendum)
Subjective:      Patient ID: Matthew [name] is a 76 y.o. male.    Movement Disorders HPI    [name] returned for his reinjection session with Dysport to address his left torticollis and mild left shoulder elevation. Response has been excellent. We again did not use EMG guidance into the right sternocleidomastoid, left splenius capitis, and left trapezius (painful area) because of the vasovagal syncope which complicated an old injection session.        Histories:     He has a past medical history of Environmental allergies, Hearing loss, Hypertension, Neck symptom, and Sleep disorder.    He has a past surgical history that includes Hernia repair [phone]); Achilles tendon repair (1/04); and Knee arthroscopy (1976).    His family history includes Colon Cancer in his mother; Heart failure in his father.    He reports that he has never smoked. He has never used smokeless tobacco. He reports current alcohol use. He reports that he does not use drugs.    Review of Systems  Refer to HPI for review of systems documentation.    Allergies:   Animal dander and Adhesive tape-silicones    Medications:     Outpatient Encounter Medications as of 09/17/2021   Medication Sig Dispense Refill    ALPHA LIPOIC ACID ORAL Take by mouth.      amLODIPine (NORVASC) 5 MG tablet Take 1 tablet (5 mg total) by mouth daily.      atorvastatin (LIPITOR) 20 MG tablet Take 1 tablet (20 mg total) by mouth daily. 30 tablet 5    azelastine (ASTELIN) 137 mcg (0.1 %) nasal spray Use 2 sprays into each nostril 2 times a day. 30 mL 6    brimonidine (ALPHAGAN) 0.2 % ophthalmic solution Place 1 drop into the right eye 2 times a day.      cetirizine (ZYRTEC) 10 MG tablet Take 1 tablet (10 mg total) by mouth daily. 30 tablet 11    CETIRIZINE HCL (ZYRTEC ORAL) Take by mouth.      finasteride (PROSCAR) 5 mg tablet       gabapentin (NEURONTIN) 600 MG tablet Take 1 tablet (600 mg total) by mouth 3 times a day. 270 tablet 1    LUMIGAN 0.01 % Drop        omega-3 acid ethyl esters (LOVAZA) 1 gram capsule Take 2 capsules (2 g total) by mouth daily.      timoloL (BETIMOL) 0.5 % ophthalmic solution 1 drop 2 times a day.      zolpidem (AMBIEN CR) 6.25 MG Take 10 mg by mouth at bedtime as needed for Sleep.       Facility-Administered Encounter Medications as of 09/17/2021   Medication Dose Route Frequency Provider Last Rate Last Admin    abobotulinum toxin A (DYSPORT) 500 unit SolR             abobotulinum toxin A (DYSPORT) SolR 500 Units  500 Units Intramuscular Once Loura Halt, MD        abobotulinum toxin A (DYSPORT) SolR 500 Units  500 Units Intramuscular Once Loura Halt, MD           Objective:       Blood pressure 108/66, pulse 62, height 5' 11 (1.803 m), weight 178 lb 3.2 oz (80.8 kg).    Botulinum Toxin Injection Summary  Was the time out preformed?: Yes  Botulinum toxin used: Dysport  Dilution (units/volume): 500:2  Total units used: 1000  Number of units  discarded: 0     Muscle 1  Muscle 1: L Splenius capitus  Muscle 1 - Units: 375  Muscle 1 - # Sites: 6  Muscle 2  Muscle 2: R Sternocleidomastoid  Muscle 2 - Units: 125  Muscle 2 - # Sites: 2  Muscle 3  Muscle 3: L Trapezius  Muscle 3 - Units: 125  Muscle 3 - # Sites: 2  Muscle 4  Muscle 4:  (L Subscapular muscles)  Muscle 4 - Units: 375  Muscle 4 - # Sites: 6       Assessment:     1. Spasmodic torticollis         Non-EMG-guided injections with Dysport took were given to address his left torticollis and mild left shoulder elevation. The procedure was tolerated well.     Plan:     A four month reinjection was recommended again this time.   Requested encephalopathy panel on serum to test for IgLON-5 disease

## 2021-09-21 NOTE — Unmapped (Signed)
Pt called again to schedule surgery. I advised Maryln Gottron was waiting on something and would call when able to schedule. Thanks!

## 2021-09-22 NOTE — Unmapped (Signed)
Spoke to Dr.Cheng in regards to this patient, he was not agreeable to surgery at his last visit as he was moving to [place] permanently in October, Dr.Cheng asked that I schedule patient a visit with him to discuss as this will be a co-case and likely take place in late October /early November. Pt scheduled 8/29 will follow up with surgery scheduling after this appointment .

## 2021-09-28 ENCOUNTER — Ambulatory Visit: Admit: 2021-10-05 | Payer: MEDICARE

## 2021-09-28 DIAGNOSIS — M4712 Other spondylosis with myelopathy, cervical region: Secondary | ICD-10-CM

## 2021-09-28 NOTE — Unmapped (Signed)
Neurosurgery Spine Clinic Note    Subjective:    76 y.o. male presents in follow up.  I had last seen him on 09/08/2021 for his history of HTN and peripheral neuropathy who presents upon referral for evaluation of worsening imbalance. 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 He denies any difficulty with swallowing, however, notes cough triggered with swallowing. He has been working with therapy for imbalance. 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 Review of MRI C-spine from 08/17/21 demonstrates severe C3-C4 central stenosis with anteriorly projecting bone spur. Review of MRI brain from 07/12/21 demonstrates no acute abnormality.    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  We discussed surgical intervention with C3-C4 ACDF to address bone spur and cervical stenosis in the setting of his ataxia and coughing fits after swallowing.  We discussed the surgery needed at length, which I would include ENT on given the dysphagia and anterior osteophyte by the epiglottis.      At that time, he noted that [place] was moving [place] [facility] [place] 2023, and wishes to have this surgery done there once he is settled.  However, he called back concerned about his symptoms and now wanting his surgery here in [place] prior to his move.  He is now requesting if we can expedite this to help.  Otherwise, he denies any new motor or sensory changes, nor any new bowel or bladder changes as well.    Objective:   Gen: Limited telemedicine exam.    Imaging: Reviewed and discussed with the patient at length.    Assessment/Plan:   In summary, Matthew MacleodRobert [name] is a patient with cervical spondylitic myelopathy and dystonia and dysphagia from C3-C4 severe stenosis and spinal cord  compression with a large anterior osteophytosis.  Overall, I spent over 30 minutes with this patient consultation, with over 50% of which was spent counseling the patient about his clinical presentation, pathophysiology, and various treatment options.  Operative and non-operative options were reviewed with operative recommendations consisting of C3-C4 anterior cervical decompression and fusion with Zero-P interbody cage due to its location, with ENT as our co-surgeon due to his dystonia and anterior osteophyte adjacent to the epiglottis.  Details of the surgical procedure were discussed at length, with risks and benefits noted.  Risks include, but are not limited to, bleeding, infection, cerebrospinal fluid leak, failure of the instrumentation and fusion construct, further neurological injury including paralysis, bowel and bladder dysfunction, coma and death.  As well, he was advised that surgery may fail to relieve his symptoms, and could potentially make them worse, and that it is unlikely that surgery would render him pain or neurological symptom free.  He verbalizes understanding of the risks and benefits, and wishes to proceed with the surgery.  He will coordinate the operation with our surgical scheduler.    Answered all of the patient's questions to his/her satisfaction and understanding, and he/she is in agreement with the plan.     This was a phone conversation in lieu of an in-person visit. The patient provided verbal consent for an over the phone visit.    Denyse DagoJoseph S Armandina Iman, MD

## 2021-10-12 ENCOUNTER — Ambulatory Visit: Payer: MEDICARE

## 2021-10-12 MED ORDER — lidocaine (PF) 2% (20 mg/mL) Soln 20 mg
20 | Freq: Once | INTRAMUSCULAR | Status: AC | PRN
Start: 2021-10-12 — End: 2021-10-12

## 2021-10-12 NOTE — Unmapped (Signed)
Spoke with Matthew [name] with Dr. Sena Hitchheng this AM. Vivianne Spencehey're trying to coordinate OR on Mon before pt moves and will need Dr. Micheal LikensPatil for approach. She stated she'd speak with OR to make sure they can add case on Mon at 2p and would call pt to discuss. She'd update me after she speaks with him so I could call to schedule consult with Dr. Micheal LikensPatil tmr    Received VM from TyonekLouanna. She spoke with pt, scheduled [facility] appt, and let pt know expect call from me to schedule preop appt with Dr. Arlana PouchPatil       Called pt. [facility] Parking instructions provided as well as my direct call back number if any questions

## 2021-10-13 ENCOUNTER — Ambulatory Visit: Admit: 2021-10-13 | Discharge: 2021-10-13 | Payer: MEDICARE

## 2021-10-13 DIAGNOSIS — Z01818 Encounter for other preprocedural examination: Secondary | ICD-10-CM

## 2021-10-13 DIAGNOSIS — M4302 Spondylolysis, cervical region: Secondary | ICD-10-CM

## 2021-10-13 NOTE — Unmapped (Signed)
CC: Radiculopathy      History of Present Illness:  Patient is 76 y.o. male here today for pre-operative evaluation. Scheduled for C3-C4 ACDF with Dr Sena Hitchheng that will require an anterior approach spine exposure    He currently has mild dysphagia.      Pain: no  Otalgia: no  Odynophagia: no  Dysphagia: No  Dyspnea: No  Lumps in neck: No  Hemoptysis: no  Fever: No  Chills: No  Sweats: No  Weight Loss: No      Reports history of chronic cough    Allergies  Animal dander and Adhesive tape-silicones    Medications  Outpatient Encounter Medications as of 10/13/2021   Medication Sig Dispense Refill    ALPHA LIPOIC ACID ORAL Take by mouth.      amLODIPine (NORVASC) 5 MG tablet Take 1 tablet (5 mg total) by mouth daily.      atorvastatin (LIPITOR) 20 MG tablet Take 1 tablet (20 mg total) by mouth daily. 30 tablet 5    azelastine (ASTELIN) 137 mcg (0.1 %) nasal spray Use 2 sprays into each nostril 2 times a day. 30 mL 6    brimonidine (ALPHAGAN) 0.2 % ophthalmic solution Place 1 drop into the right eye 2 times a day.      cetirizine (ZYRTEC) 10 MG tablet Take 1 tablet (10 mg total) by mouth daily. 30 tablet 11    CETIRIZINE HCL (ZYRTEC ORAL) Take by mouth.      finasteride (PROSCAR) 5 mg tablet       gabapentin (NEURONTIN) 600 MG tablet Take 1 tablet (600 mg total) by mouth 3 times a day. 270 tablet 1    LUMIGAN 0.01 % Drop       omega-3 acid ethyl esters (LOVAZA) 1 gram capsule Take 2 capsules (2 g total) by mouth daily.      timoloL (BETIMOL) 0.5 % ophthalmic solution 1 drop 2 times a day.      zolpidem (AMBIEN CR) 6.25 MG Take 10 mg by mouth at bedtime as needed for Sleep.       Facility-Administered Encounter Medications as of 10/13/2021   Medication Dose Route Frequency Provider Last Rate Last Admin    abobotulinum toxin A (DYSPORT) SolR 500 Units  500 Units Intramuscular Once Loura HaltAlberto Espay, MD        abobotulinum toxin A (DYSPORT) SolR 500 Units  500 Units Intramuscular Once Loura HaltAlberto Espay, MD        lidocaine (PF) 2% (20  mg/mL) Soln 20 mg  1 mL Intradermal Once PRN Denyse DagoJoseph S Cheng, MD            Review of Systems:  * See scanned Review of Systems sheet.    Vitals  There were no vitals taken for this visit.    PHYSICAL EXAM   General:     General Appearance: well-developed, well-nourished and in no acute distress    Communication: I was able to converse well with the patient. Patient was able to answer questions adequately and appropriately.    Head & Face (general): normal appearance    Face (palpation): no sinus tenderness    Salivary Glands (palpation): salivary glands NL size.    Voice Quality: Normal  Eyes:     External: anicteric sclera, conjunctiva not injected, lids no lesions or swelling    Extraoccular Muscle: intact  Nose:     External: external nose without infection or abnormality.    Internal: anterior rhinoscopy performed. Turbinates non-erythematous, non-swollen, no  pus, septum midline, mucosa intact. No masses, polyps or pus. No septal perforation.  Ears:     External Ears: external ears are of normal appearance. No masses, lesions or scars.  House-Brackman Grading System for Facial Nerve Dysfunction:     (Left) Grade 1: Normal movement    (Right) Grade 1: Normal movement  Mouth:     Lip/teeth/gums: healthy dentition, lips, teeth and gums in good condition. no gingival inflammation, no labial lesions. Mucosa: No leukoplakia or masses. Hard/soft palates and tongue of NL symmetry.    Tongue: normal midline, normal mucosa    Oropharynx/ Tonsils: pharyngeal walls and tonsillar fossae without abnormalities.    Hypopharynx: pharyngeal walls and tonsillar fossae without abnormality, pyriform sinuses open and symmetrical, no masses  Neck:     Overall appearance NL. No masses. Neck symmetric with NL tracheal position. Normal laryngeal crepitus. No masses, tenderness or enlargement of thyroid on palpation.  Lymphatic:     No cervical, supraclavicular or auricular adenopathy  Salivary glands NL size.    Thyroid Exam: no  nodules, masses, tenderness or enlargement of thyroid on palpation.  Respiratory Inspection:     Respiratory Effort: breathing comfortably, no increased work of breathing.  Cardiovascular:     Carotid arteries: pulses 2+, symmetric, no bruits  Skin:     Inspection: no lower extremity edema, rashes, lesions, or ulcerations, well developed, turgor intact    Palpation: no subcutaneous nodules or induration  Musculoskeletal:     Gait and Station: intact without difficulty  Neurologic:     Cranial Nerves: II - XII grossly intact  Mental Status:     Orientation: oriented to time, place, and person    Mood and affect: NL mood and affect. A+O x 4.      Flexible Laryngoscopy without biopsy  Anesthesia: Topical 1% Xylocaine with Afrin  Estimated Blood Loss: None    Procedure:   After obtaining consent, the patient was placed in the examination chair in the upright position.  Decongestant and topical anesthetic was sprayed in the right and left nostrils.  After allowing adequate time for hemostatic effect, the flexible 4 mm laryngoscope was passed via the right and left nostrils.      Nasal Septum: normal;   Nasal Findings: normal;   Nasopharynx: normal   Eustachian Tube: normal   Oropharynx: normal   Base of Tongue: normal   Epiglottis: normal   True Vocal Cord normal   False Vocal Cord: normal   True Vocal Cord Movement: normal   Hypopharynx Mucosa: normal    Exam Completed - 10/13/2021     * Patient tolerated the procedure well with no complications   * Patient was instructed not to eat for 30 minutes following procedure.   * Patient was instructed that they may notice minor bleeding.          Lab Review:   No results found for: WBC, HGB, HCT, MCV, PLT, CREATININE, BUN, NA, K, CL, CO2, ALT, AST, GGT, ALKPHOS, BILITOT         Assessment and Plan:    OR with Dr Sena Hitch    Rba of surgery reviewed    Portions of the note for this encounter was transcribed by Brunilda Payor, RN acting as a scribe when documenting those areas.  I  personally performed the history, physical exam, procedure and medical decision making.  The documentation recorded by the scribe accurately reflects the services I performed and the decisions made by me  This progress  note was copied forward from the note written by Kallie Locks MD from my last clinic/office note. I have reviewed and updated the history, physical exam, data, assessment and plan of the note so that it reflects the evaluation and management of the patient on 10/13/21.

## 2021-10-14 ENCOUNTER — Ambulatory Visit: Admit: 2021-10-14 | Payer: MEDICARE

## 2021-10-14 DIAGNOSIS — M4712 Other spondylosis with myelopathy, cervical region: Secondary | ICD-10-CM

## 2021-10-14 LAB — CBC
Hematocrit: 37.6 % (ref 38.5–50.0)
Hemoglobin: 13.1 g/dL (ref 13.2–17.1)
MCH: 32.9 pg (ref 27.0–33.0)
MCHC: 34.8 g/dL (ref 32.0–36.0)
MCV: 94.6 fL (ref 80.0–100.0)
MPV: 8.9 fL (ref 7.5–11.5)
Platelets: 141 10*3/uL (ref 140–400)
RBC: 3.97 10*6/uL (ref 4.20–5.80)
RDW: 13.2 % (ref 11.0–15.0)
WBC: 6.7 10*3/uL (ref 3.8–10.8)

## 2021-10-14 LAB — APTT: aPTT: 34.7 seconds (ref 25.5–35.0)

## 2021-10-14 LAB — COMPREHENSIVE METABOLIC PANEL
ALT: 24 U/L (ref 7–52)
AST: 24 U/L (ref 13–39)
Albumin: 4.2 g/dL (ref 3.5–5.7)
Alkaline Phosphatase: 66 U/L (ref 36–125)
Anion Gap: 7 mmol/L (ref 3–16)
BUN: 22 mg/dL (ref 7–25)
CO2: 31 mmol/L (ref 21–33)
Calcium: 9.5 mg/dL (ref 8.6–10.3)
Chloride: 105 mmol/L (ref 98–110)
Creatinine: 0.85 mg/dL (ref 0.60–1.30)
EGFR: >90
Glucose: 84 mg/dL (ref 70–100)
Osmolality, Calculated: 299 mosm/kg (ref 278–305)
Potassium: 3.9 mmol/L (ref 3.5–5.3)
Sodium: 143 mmol/L (ref 133–146)
Total Bilirubin: 0.7 mg/dL (ref 0.0–1.5)
Total Protein: 6.8 g/dL (ref 6.4–8.9)

## 2021-10-14 LAB — DIFFERENTIAL
Basophils Absolute: 40 /uL (ref 0–200)
Basophils Relative: 0.6 % (ref 0.0–1.0)
Eosinophils Absolute: 389 /uL (ref 15–500)
Eosinophils Relative: 5.8 % (ref 0.0–8.0)
Lymphocytes Absolute: 2271 /uL (ref 850–3900)
Lymphocytes Relative: 33.9 % (ref 15.0–45.0)
Monocytes Absolute: 697 /uL (ref 200–950)
Monocytes Relative: 10.4 % (ref 0.0–12.0)
Neutrophils Absolute: 3303 /uL (ref 1500–7800)
Neutrophils Relative: 49.3 % (ref 40.0–80.0)
nRBC: 0 /100 WBC (ref 0–0)

## 2021-10-14 LAB — PROTIME-INR
INR: 1 (ref 0.9–1.1)
Protime: 13.9 seconds (ref 12.1–15.1)

## 2021-10-14 LAB — ABO/RH: Rh Type: POSITIVE

## 2021-10-14 LAB — POC URINALYSIS
Bilirubin, UA: NEGATIVE
Blood, POC, UA: NEGATIVE
Glucose, POC, UA: NEGATIVE mg/dL
Ketones, POC, UA: NEGATIVE mg/dL
Leukocytes, POC, UA: NEGATIVE
Nitrite, POC, UA: NEGATIVE
Protein, POC, UA: NEGATIVE mg/dL
Spec Grav, UA: 1.025 (ref 1.005–1.035)
Urobilinogen, POC, UA: 0.2 mg/dL (ref 0.2–1.0)
pH, POC,UA: 6 (ref 5.0–8.0)

## 2021-10-14 LAB — ANTIBODY SCREEN: Antibody Screen: NEGATIVE

## 2021-10-14 LAB — MRSA/STAPH AUREUS DNA - PRE-SURGICAL SCREEN ONLY
MRSA, PCR: NEGATIVE
Staph Aureus, PCR: NEGATIVE

## 2021-10-14 NOTE — Unmapped (Signed)
ANESTHESIOLOGY CONSULTATION AND PRE-OPERATIVE HISTORY AND PHYSICAL      [facility] Attending Physician: Dr. Rosalita Chessman  [facility] NP / PA: Lonia Farber, CNP    Date of Surgery: 10/18/2021  Surgeon:  Dr. Sena Hitch and Dr. Micheal Likens      Diagnosis: Cervical spondylosis with myelopathy, esophageal dysphagia   Procedure:  C3-C4 ANTERIOR CERVICAL DECOMPRESSION AND FUSION.     Patient ID: Matthew [name] is a 76 y.o. male.    Chief Complaint   Patient presents with    Pre-op Exam     Cervical spondylosis with myelopathy, esophageal dysphagia     Referral Indication: Risk stratification  History of Present Illness:  Matthew [name] is a 76 y.o. male with a hx of HTN, HLD, hearing loss who will undergo a C3-C4 ANTERIOR CERVICAL DECOMPRESSION AND FUSION under general anesthesia for Cervical spondylosis with myelopathy, esophageal dysphagia.    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   MRI C-spine from 08/17/21 demonstrates severe C3-C4 central stenosis with anteriorly projecting bone spur. MRI brain from 07/12/21 demonstrates no acute abnormality.   Last seen by Dr. Sena Hitch on 09/28/2021 and will now undergo surgery as listed above.    He presents today for preoperative assessment.    Medical History:     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    Environmental allergies     Hearing loss     left (since age 57), right more recent    High cholesterol     Hypertension     Neck symptom     Trouble turning head to the left     Sleep apnea     Sleep disorder        Surgical History:     Past Surgical History:   Procedure Laterality Date    ACHILLES TENDON REPAIR  01/31/2002    left     APPENDECTOMY      HERNIA REPAIR  02/01/1951    KNEE SURGERY Left 1976    TONSILLECTOMY      WISDOM TOOTH EXTRACTION         Family History:     Family History   Problem Relation Age of Onset    Colon Cancer  Mother     Heart failure Father        Allergies:     Allergies   Allergen Reactions    Animal Dander      Causes congestion    Adhesive Tape-Silicones Rash       Medications:     Prior to Admission medications taking for visit date 10/14/21   Medication Sig Taking? Authorizing Provider   ALPHA LIPOIC ACID ORAL Take by mouth. Yes Historical Provider, MD   amLODIPine (NORVASC) 5 MG tablet Take 1 tablet (5 mg total) by mouth daily. Yes Historical Provider, MD   azelastine (ASTELIN) 137 mcg (0.1 %) nasal spray Use 2 sprays into each nostril 2 times a day. Yes Lorretta Harp, MD   baclofen (LIORESAL) 10 MG tablet Take 1 tablet (10 mg total) by mouth. Yes Historical Provider, MD   brimonidine (ALPHAGAN) 0.2 % ophthalmic solution Place 1 drop into the right eye 2 times a day. Yes Historical Provider, MD   cetirizine (ZYRTEC) 10 MG tablet Take 1 tablet (10 mg total) by mouth daily. Yes Lorretta Harp, MD   doxepin (  SINEQUAN) 10 MG capsule Take 1 capsule (10 mg total) by mouth at bedtime. Yes Historical Provider, MD   finasteride (PROSCAR) 5 mg tablet  Yes Historical Provider, MD   gabapentin (NEURONTIN) 600 MG tablet Take 1 tablet (600 mg total) by mouth 3 times a day.  Patient taking differently: Take 1 tablet (600 mg total) by mouth daily. Yes Loura HaltAlberto Espay, MD   LUMIGAN 0.01 % Drop  Yes Historical Provider, MD   omega-3 acid ethyl esters (LOVAZA) 1 gram capsule Take 2 capsules (2 g total) by mouth daily. Yes Historical Provider, MD   rosuvastatin (CRESTOR) 10 MG tablet Take 1 tablet (10 mg total) by mouth every evening. Yes Historical Provider, MD   tadalafiL (CIALIS) 20 MG tablet Take by mouth. Yes Historical Provider, MD   timolol (TIMOPTIC) 0.5 % ophthalmic solution INSTILL 1 DROP INTO BOTH EYES EVERY 12 HOURS Yes Historical Provider, MD   zolpidem (AMBIEN CR) 6.25 MG Take 10 mg by mouth at bedtime as needed for Sleep. Yes Historical Provider, MD        Review of Systems   Constitutional:  Negative for activity  change, appetite change, chills, diaphoresis, fatigue, fever, weight gain and weight loss.   HENT:  Positive for congestion (r/t allergies, using nasal spray). Negative for dental problem, ear pain, hearing loss, mouth sores, rhinorrhea, tinnitus and trouble swallowing.    Eyes:  Negative for pain, discharge, redness, itching and visual disturbance.        Glasses  Using gtts for glaucoma prevention per pt   Respiratory:  Positive for cough (Chronic per pt). Negative for choking, chest tightness, shortness of breath and wheezing.    Cardiovascular:  Negative for chest pain, palpitations and leg swelling.   Gastrointestinal:  Negative for abdominal distention, abdominal pain, blood in stool, constipation, diarrhea, heartburn, nausea and vomiting.   Genitourinary:  Negative for decreased urine volume, difficulty urinating, dysuria, flank pain, frequency, hematuria, nocturia, penile swelling, scrotal swelling and urgency.   Musculoskeletal:  Positive for back pain (general stiffness upper>lower). Negative for arthralgias, gait problem, joint swelling, myalgias, neck pain and neck stiffness.   Skin:  Negative for pallor, rash and wound.   Neurological:  Positive for light-headedness (endorses balance issues, noticed maybe 3-4 yrs ago but has worsened over last year) and numbness (peripheral to hands and feet). Negative for dizziness, tremors, seizures, syncope, facial asymmetry, speech difficulty, weakness and headaches.        Denies hx of seizure or stroke     Hematological:  Negative for adenopathy. Does not bruise/bleed easily.        Denies hx of DVT or PE     Psychiatric/Behavioral:  Negative for confusion, depression, hallucinations, sleep disturbance and suicidal ideas. The patient is not nervous/anxious and is not hyperactive.      Objective:   Blood pressure 122/77, pulse 57, temperature 98.4 F (36.9 C), temperature source Oral, resp. rate 14, height 5' 11 (1.803 m), weight 179 lb 4.8 oz (81.3 kg), SpO2 100  %.    Physical Exam  Constitutional:       Appearance: Normal appearance.      Comments: Body mass index is 25.01 kg/m.     HENT:      Head: Normocephalic.      Right Ear: External ear normal.      Left Ear: External ear normal.      Nose: Nose normal.      Mouth/Throat:      Mouth: Mucous  membranes are moist.      Pharynx: Oropharynx is clear.   Eyes:      General:         Right eye: No discharge.         Left eye: No discharge.      Conjunctiva/sclera: Conjunctivae normal.      Pupils: Pupils are equal, round, and reactive to light.   Neck:      Vascular: No carotid bruit.      Comments: Slightly limited ROM with left lateral movement and limited head flexion. Head extension okay.  Cardiovascular:      Rate and Rhythm: Normal rate and regular rhythm.      Pulses: Normal pulses.      Heart sounds: Normal heart sounds. No murmur heard.    No friction rub.   Pulmonary:      Effort: Pulmonary effort is normal. No respiratory distress.      Breath sounds: Normal breath sounds. No wheezing or rhonchi.   Abdominal:      General: Abdomen is flat. There is no distension.      Palpations: Abdomen is soft.      Tenderness: There is no abdominal tenderness. There is no right CVA tenderness or left CVA tenderness.   Musculoskeletal:         General: No swelling or signs of injury. Normal range of motion.      Right lower leg: No edema.      Left lower leg: No edema.      Comments: Strength 5/5 BUE/BLE   Skin:     General: Skin is warm and dry.      Coloration: Skin is not pale.      Findings: No bruising or rash.   Neurological:      General: No focal deficit present.      Mental Status: He is alert and oriented to person, place, and time.      Motor: No weakness.      Gait: Gait normal.   Psychiatric:         Mood and Affect: Mood normal.         Behavior: Behavior normal.         Thought Content: Thought content normal.         Judgment: Judgment normal.       Lab Review:   BMP:       Invalid input(s): AGAP, GLU, CREAT,  GFRNA  CBC:     COAGS:     RENAL:    HEP:     HGBA1C:         Study Results:   Study Results:      CT coronary calcium scoring 09/03/2021  IMPRESSION:   1. Total calcium score of 483, implying extensive atherosclerotic plaque. There is a high likelihood of at least one significant coronary narrowing.     MRI c-spine 07/14/2021  IMPRESSION:   1. 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 Severe left C4 foraminal stenosis.   2.  Degenerative changes other levels without additional cord compression. Mild to moderate foraminal narrowing as described.     MRI head 06/25/2021  IMPRESSION:   1.  No acute abnormality.   2.  Mild chronic small vessel ischemic disease.   3.  No significant atrophy of the brainstem or cerebellum.   4.  Suspected retrolisthesis and disc bulge at C3-4 on localizer images. Consider  further evaluation with cervical spine MRI as clinically indicated.       Anesthesia Considerations:   ASA Physical Status:  2    Duke Activity Scale:  8 - Moving heavy furniture; rapidly climbing stairs; carrying 20 pounds up stairs.    Airway:  Mallampati II (hard and soft palate, upper portion of tonsils anduvula visible), Thyromental distance 3 finger breadths, opening 3 finger breadths    Slightly limited with left lateral movement and head flexion. Head extension okay.    IV Access:  Denies hx of issues with blood draws or IV placements. Appears adequate upon exam.     Anesthesia Complications:  Denies any personal or family hx of anesthesia related complications.    Assessment and Recommendations:   Matthew [name] is a 76 y.o. male who will undergo a C3-C4 ANTERIOR CERVICAL DECOMPRESSION AND FUSION under general anesthesia for Cervical spondylosis with myelopathy, esophageal dysphagia.    1. Cardiac risk/functional status:   Denies MI/CAD, or CHF.   Denies h/o cardiac work-up. Has never followed with cardiology.     CT  coronary calcium scoring 09/03/2021 of 483, implying extensive atherosclerotic plaque. There is a high likelihood of at least one significant coronary narrowing.     Per pt  this was done per PCP d/t his hx of HTN and recent elevated lipids.    Per telephone note with PCP in 09/07/2021 at [facility] he has been started on a statin and a cardiology recommendation was recommended.   Has cardiology appt scheduled on 9/28.    Duke activity score: 8. Functional status: Stationary bike daily for 60 minutes, balance exercises, frequently plays tennis, previously lifting moderate weights but backed off recently with pending surgery. Can use stairs when needed without difficulty. Denies CP/SOB at rest or with activity, denies orthopnea. VSS and exam benign today.    2. HTN:   BP today: 122/77.   Taking amlodipine, instructed to continue perioperatively.     3. HLD:   Taking atorvastatin. To continue perioperatively.    4. Hearing loss:  Left since age 27 and right more recently, hearing aid to right ear.    5. Cervical spondylosis with myopathy:  Pt with cervical spondylitic myelopathy and dystonia and dysphagia from C3-C4 severe stenosis and spinal cord compression with a large anterior osteophytosis.   MRI C-spine from 08/17/21 demonstrates severe C3-C4 central stenosis with anteriorly projecting bone spur.   MRI brain from 07/12/21 demonstrates no acute abnormality.   Last seen by Dr. Sena Hitch on 09/28/2021 and will now undergo surgery as listed above.  Taking gabapentin TID. To continue perioperatively.     6. Cervical dystonia:  Receives Dysport injection through [facility] neuro, Dr. Waunita Schooner, last received on 8/18.    7. Sleep disturbance:  Takes zolpidem nightly PRN. To continue perioperatively if needed.    8. OSA:   Previously using BiPAP nightly, not currently using. Also uses an oral appliance at times.    9. Allergy alert:  Adhesives if left on for long periods - rash only. Okay with paper tape and bandaids.    10. 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 Taking gabapentin daily. To continue perioperatively.   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      I discussed this case with attending anesthesiologist, Dr. Rosalita Chessman, who also spoke with the patient today. He was in  agreement with the above assessment and plan. No further workup recommended at this time.      Today, we obtained: T/S, CBC w/ diff, CMP, PT/INR, PTT, UA with culture if needed, MRSA nasal Swab    Pre procedural instructions discussed with patient, they verbalized understanding.     Chlorhexidine wash/lotion provided to patient with instruction cards, instructions reviewed.  Mupirocin instructions reviewed should nasal swab return positive.   Instructed to bring instruction card morning of surgery.   Patient verbalized understanding.    I have conveyed my assessment and recommendations to the referring provider via shared EMR.     Lonia Farber, CNP    Number and Complexity of Problems Addressed  1 acute, uncomplicated illness or injury    Risk of Complications and/or Morbidity or Mortality of Patient Management  High    Time  I spent a total of 43 minutes on the day of the visit.

## 2021-10-14 NOTE — Unmapped (Addendum)
Patient here today for Anesthesia Consult visit prior to C3-4 Anterior Cervical Decompression and Fusion on 10/18/21 with Dr. Sena Hitch. Procedure time 1400, Arrival time 1200.    Verified patient contact information and pharmacy. Home medication reconciliation reviewed with patient. Visitor policy and pain scale discussed, and informational materials provided. Preop instructions reviewed with patient and a written copy was provided, including AVS.   Instructed to hold all vitamins, herbal supplements, cannabis products, vitamin E & all multivitamins for 2 weeks prior to surgery. Hold chondroitin & glucosamine for 2 days prior to surgery. Ok to continue ferrous sulfate, fish oil, melatonin, vitamin B12, iron, calcium, magnesium & potassium through surgery.    Instructed to hold the following medications the morning of surgery:  Cialis   Instructed to take the following medications the morning of surgery:  Amlodipine   Eye drops   Gabapentin   Crestor     Instructed the patient to notify their surgeons office and PCP if they begin to feel ill prior to surgery.  Patient verbalizes understanding all instructions and denies further questions. Provided patient with phone number to contact this office with further questions or concerns.     Labs were collected this visit.     Provided patient with CHG body wash and Mupirocin ointment, educated patient and wife on use. Both verbalize understanding.     Patient seen by Lonia Farber, CNP, see consult note.      Rosann Auerbach, RN   Anesthesia   [phone]

## 2021-10-14 NOTE — Unmapped (Signed)
ANESTHESIOLOGY CONSULTATION AND PRE-OPERATIVE HISTORY AND PHYSICAL      [facility] Attending Physician: Dr. Rosalita Chessman  [facility] NP / PA: Lonia Farber, CNP    Date of Surgery: 10/18/2021  Surgeon:  Dr. Sena Hitch and Dr. Micheal Likens      Diagnosis: Cervical spondylosis with myelopathy, esophageal dysphagia   Procedure:  C3-C4 ANTERIOR CERVICAL DECOMPRESSION AND FUSION.     Patient ID: Jermiah Soderman is a 76 y.o. male.    Chief Complaint   Patient presents with    Pre-op Exam     Cervical spondylosis with myelopathy, esophageal dysphagia     Referral Indication: Risk stratification  History of Present Illness:  Harpreet Pompey is a 76 y.o. male with a hx of HTN, HLD, hearing loss who will undergo a C3-C4 ANTERIOR CERVICAL DECOMPRESSION AND FUSION under general anesthesia for Cervical spondylosis with myelopathy, esophageal dysphagia.    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   MRI C-spine from 08/17/21 demonstrates severe C3-C4 central stenosis with anteriorly projecting bone spur. MRI brain from 07/12/21 demonstrates no acute abnormality.   Last seen by Dr. Sena Hitch on 09/28/2021 and will now undergo surgery as listed above.    He presents today for preoperative assessment.    Medical History:     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    Environmental allergies     Hearing loss     left (since age 57), right more recent    High cholesterol     Hypertension     Neck symptom     Trouble turning head to the left     Sleep apnea     Sleep disorder        Surgical History:     Past Surgical History:   Procedure Laterality Date    ACHILLES TENDON REPAIR  01/31/2002    left     APPENDECTOMY      HERNIA REPAIR  02/01/1951    KNEE SURGERY Left 1976    TONSILLECTOMY      WISDOM TOOTH EXTRACTION         Family History:     Family History   Problem Relation Age of Onset    Colon Cancer  Mother     Heart failure Father        Allergies:     Allergies   Allergen Reactions    Animal Dander      Causes congestion    Adhesive Tape-Silicones Rash       Medications:     Prior to Admission medications taking for visit date 10/14/21   Medication Sig Taking? Authorizing Provider   ALPHA LIPOIC ACID ORAL Take by mouth. Yes Historical Provider, MD   amLODIPine (NORVASC) 5 MG tablet Take 1 tablet (5 mg total) by mouth daily. Yes Historical Provider, MD   azelastine (ASTELIN) 137 mcg (0.1 %) nasal spray Use 2 sprays into each nostril 2 times a day. Yes Lorretta Harp, MD   baclofen (LIORESAL) 10 MG tablet Take 1 tablet (10 mg total) by mouth. Yes Historical Provider, MD   brimonidine (ALPHAGAN) 0.2 % ophthalmic solution Place 1 drop into the right eye 2 times a day. Yes Historical Provider, MD   cetirizine (ZYRTEC) 10 MG tablet Take 1 tablet (10 mg total) by mouth daily. Yes Lorretta Harp, MD   doxepin (  SINEQUAN) 10 MG capsule Take 1 capsule (10 mg total) by mouth at bedtime. Yes Historical Provider, MD   finasteride (PROSCAR) 5 mg tablet  Yes Historical Provider, MD   gabapentin (NEURONTIN) 600 MG tablet Take 1 tablet (600 mg total) by mouth 3 times a day.  Patient taking differently: Take 1 tablet (600 mg total) by mouth daily. Yes Loura HaltAlberto Espay, MD   LUMIGAN 0.01 % Drop  Yes Historical Provider, MD   omega-3 acid ethyl esters (LOVAZA) 1 gram capsule Take 2 capsules (2 g total) by mouth daily. Yes Historical Provider, MD   rosuvastatin (CRESTOR) 10 MG tablet Take 1 tablet (10 mg total) by mouth every evening. Yes Historical Provider, MD   tadalafiL (CIALIS) 20 MG tablet Take by mouth. Yes Historical Provider, MD   timolol (TIMOPTIC) 0.5 % ophthalmic solution INSTILL 1 DROP INTO BOTH EYES EVERY 12 HOURS Yes Historical Provider, MD   zolpidem (AMBIEN CR) 6.25 MG Take 10 mg by mouth at bedtime as needed for Sleep. Yes Historical Provider, MD        Review of Systems   Constitutional:  Negative for activity  change, appetite change, chills, diaphoresis, fatigue, fever, weight gain and weight loss.   HENT:  Positive for congestion (r/t allergies, using nasal spray). Negative for dental problem, ear pain, hearing loss, mouth sores, rhinorrhea, tinnitus and trouble swallowing.    Eyes:  Negative for pain, discharge, redness, itching and visual disturbance.        Glasses  Using gtts for glaucoma prevention per pt   Respiratory:  Positive for cough (Chronic per pt). Negative for choking, chest tightness, shortness of breath and wheezing.    Cardiovascular:  Negative for chest pain, palpitations and leg swelling.   Gastrointestinal:  Negative for abdominal distention, abdominal pain, blood in stool, constipation, diarrhea, heartburn, nausea and vomiting.   Genitourinary:  Negative for decreased urine volume, difficulty urinating, dysuria, flank pain, frequency, hematuria, nocturia, penile swelling, scrotal swelling and urgency.   Musculoskeletal:  Positive for back pain (general stiffness upper>lower). Negative for arthralgias, gait problem, joint swelling, myalgias, neck pain and neck stiffness.   Skin:  Negative for pallor, rash and wound.   Neurological:  Positive for light-headedness (endorses balance issues, noticed maybe 3-4 yrs ago but has worsened over last year) and numbness (peripheral to hands and feet). Negative for dizziness, tremors, seizures, syncope, facial asymmetry, speech difficulty, weakness and headaches.        Denies hx of seizure or stroke     Hematological:  Negative for adenopathy. Does not bruise/bleed easily.        Denies hx of DVT or PE     Psychiatric/Behavioral:  Negative for confusion, depression, hallucinations, sleep disturbance and suicidal ideas. The patient is not nervous/anxious and is not hyperactive.      Objective:   Blood pressure 122/77, pulse 57, temperature 98.4 F (36.9 C), temperature source Oral, resp. rate 14, height 5' 11 (1.803 m), weight 179 lb 4.8 oz (81.3 kg), SpO2 100  %.    Physical Exam  Constitutional:       Appearance: Normal appearance.      Comments: Body mass index is 25.01 kg/m.     HENT:      Head: Normocephalic.      Right Ear: External ear normal.      Left Ear: External ear normal.      Nose: Nose normal.      Mouth/Throat:      Mouth: Mucous  membranes are moist.      Pharynx: Oropharynx is clear.   Eyes:      General:         Right eye: No discharge.         Left eye: No discharge.      Conjunctiva/sclera: Conjunctivae normal.      Pupils: Pupils are equal, round, and reactive to light.   Neck:      Vascular: No carotid bruit.      Comments: Slightly limited ROM with left lateral movement and limited head flexion. Head extension okay.  Cardiovascular:      Rate and Rhythm: Normal rate and regular rhythm.      Pulses: Normal pulses.      Heart sounds: Normal heart sounds. No murmur heard.    No friction rub.   Pulmonary:      Effort: Pulmonary effort is normal. No respiratory distress.      Breath sounds: Normal breath sounds. No wheezing or rhonchi.   Abdominal:      General: Abdomen is flat. There is no distension.      Palpations: Abdomen is soft.      Tenderness: There is no abdominal tenderness. There is no right CVA tenderness or left CVA tenderness.   Musculoskeletal:         General: No swelling or signs of injury. Normal range of motion.      Right lower leg: No edema.      Left lower leg: No edema.      Comments: Strength 5/5 BUE/BLE   Skin:     General: Skin is warm and dry.      Coloration: Skin is not pale.      Findings: No bruising or rash.   Neurological:      General: No focal deficit present.      Mental Status: He is alert and oriented to person, place, and time.      Motor: No weakness.      Gait: Gait normal.   Psychiatric:         Mood and Affect: Mood normal.         Behavior: Behavior normal.         Thought Content: Thought content normal.         Judgment: Judgment normal.       Lab Review:   BMP:       Invalid input(s): AGAP, GLU, CREAT,  GFRNA  CBC:     COAGS:     RENAL:    HEP:     HGBA1C:         Study Results:   Study Results:      CT coronary calcium scoring 09/03/2021  IMPRESSION:   1. Total calcium score of 483, implying extensive atherosclerotic plaque. There is a high likelihood of at least one significant coronary narrowing.     MRI c-spine 07/14/2021  IMPRESSION:   1. 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 Severe left C4 foraminal stenosis.   2.  Degenerative changes other levels without additional cord compression. Mild to moderate foraminal narrowing as described.     MRI head 06/25/2021  IMPRESSION:   1.  No acute abnormality.   2.  Mild chronic small vessel ischemic disease.   3.  No significant atrophy of the brainstem or cerebellum.   4.  Suspected retrolisthesis and disc bulge at C3-4 on localizer images. Consider  further evaluation with cervical spine MRI as clinically indicated.       Anesthesia Considerations:   ASA Physical Status:  2    Duke Activity Scale:  8 - Moving heavy furniture; rapidly climbing stairs; carrying 20 pounds up stairs.    Airway:  Mallampati II (hard and soft palate, upper portion of tonsils anduvula visible), Thyromental distance 3 finger breadths, opening 3 finger breadths    Slightly limited with left lateral movement and head flexion. Head extension okay.    IV Access:  Denies hx of issues with blood draws or IV placements. Appears adequate upon exam.     Anesthesia Complications:  Denies any personal or family hx of anesthesia related complications.    Assessment and Recommendations:   Caymen Dubray is a 76 y.o. male who will undergo a C3-C4 ANTERIOR CERVICAL DECOMPRESSION AND FUSION under general anesthesia for Cervical spondylosis with myelopathy, esophageal dysphagia.    1. Cardiac risk/functional status:   Denies MI/CAD, or CHF.   Denies h/o cardiac work-up. Has never followed with cardiology.     CT  coronary calcium scoring 09/03/2021 of 483, implying extensive atherosclerotic plaque. There is a high likelihood of at least one significant coronary narrowing.     Per pt  this was done per PCP d/t his hx of HTN and recent elevated lipids.    Per telephone note with PCP in 09/07/2021 at [facility] he has been started on a statin and a cardiology recommendation was recommended.   Has cardiology appt scheduled on 9/28.    Duke activity score: 8. Functional status: Stationary bike daily for 60 minutes, balance exercises, frequently plays tennis, previously lifting moderate weights but backed off recently with pending surgery. Can use stairs when needed without difficulty. Denies CP/SOB at rest or with activity, denies orthopnea. VSS and exam benign today.    2. HTN:   BP today: 122/77.   Taking amlodipine, instructed to continue perioperatively.     3. HLD:   Taking atorvastatin. To continue perioperatively.    4. Hearing loss:  Left since age 27 and right more recently, hearing aid to right ear.    5. Cervical spondylosis with myopathy:  Pt with cervical spondylitic myelopathy and dystonia and dysphagia from C3-C4 severe stenosis and spinal cord compression with a large anterior osteophytosis.   MRI C-spine from 08/17/21 demonstrates severe C3-C4 central stenosis with anteriorly projecting bone spur.   MRI brain from 07/12/21 demonstrates no acute abnormality.   Last seen by Dr. Sena Hitch on 09/28/2021 and will now undergo surgery as listed above.  Taking gabapentin TID. To continue perioperatively.     6. Cervical dystonia:  Receives Dysport injection through [facility] neuro, Dr. Waunita Schooner, last received on 8/18.    7. Sleep disturbance:  Takes zolpidem nightly PRN. To continue perioperatively if needed.    8. OSA:   Previously using BiPAP nightly, not currently using. Also uses an oral appliance at times.    9. Allergy alert:  Adhesives if left on for long periods - rash only. Okay with paper tape and bandaids.    10. 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 Taking gabapentin daily. To continue perioperatively.   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      I discussed this case with attending anesthesiologist, Dr. Rosalita Chessman, who also spoke with the patient today. He was in  agreement with the above assessment and plan. No further workup recommended at this time.      Today, we obtained: T/S, CBC w/ diff, CMP, PT/INR, PTT, UA with culture if needed, MRSA nasal Swab    Pre procedural instructions discussed with patient, they verbalized understanding.     Chlorhexidine wash/lotion provided to patient with instruction cards, instructions reviewed.  Mupirocin instructions reviewed should nasal swab return positive.   Instructed to bring instruction card morning of surgery.   Patient verbalized understanding.    I have conveyed my assessment and recommendations to the referring provider via shared EMR.     Lonia Farber, CNP    Number and Complexity of Problems Addressed  1 acute, uncomplicated illness or injury    Risk of Complications and/or Morbidity or Mortality of Patient Management  High    Time  I spent a total of 43 minutes on the day of the visit.

## 2021-10-14 NOTE — Unmapped (Addendum)
[facility]:    [address]    [place], [place] [address].   [phone]    Arrival Instructions    We're pleased that you have chosen [facility] for your upcoming procedure.  The staff serving you has been professionally trained to provide the highest quality care.  We encourage you to ask questions and to let the staff know of any special needs,as we want your visit to be as comfortable as possible.    Your procedure is scheduled on 10/18/2021 at  2:00 PM.  Please arrive at 12:00 PM to [facility]: the registration area in the main lobby.    Parking:  [employer]: the [place] located at [address]. (formerly [address]). *Parking tickets can be validated at the [employer] near the [employer].  *Valet available 6:00am-6:00pm for a fee       Fasting Instructions Prior to Surgery or Procedure Requiring Sedation    DO NOT EAT OR DRINK ANYTHING (including gum, mints, water, etc.) after midnight the night before your procedure.  You may brush your teeth and gargle on the morning of surgery, but do not swallow any water.  If you have morning medications to take, you may take them with small sips of water.        Medication Instructions  Bring a list of your current medications, along with the dose and frequency. Please include any vitamins, herbal supplements, and over the counter medications you may be taking.  Your pre op nurse will need to know when your last dose of medication was taken.    *MEDICATIONS TO TAKE MORNING OF SURGERY. Take the following medications morning of surgery with a small sip of water:  Amlodipine  Eye drops  Gabapentin     Please DO NOT take the following medications the morning of surgery:    Cialis           Over-the-Counter Medication (OTC)  Instructions:    -Some over-the-counter Medications may thin your blood:  Stop taking over-the-counter blood thinners seven (7) to ten (10)  days prior to your surgery.  Examples of over the counter blood thinners include:  Vitamin E and NSAIDs (Motrin, Naproxen, Aleve, Advil, Ibuprofen, Aspirin).      -Herbal Supplements and multivitamins should be stopped two (2) weeks prior to surgery.    -It is OK to continue taking: Fish oil, omege-3 fatty acids, melatonin, and supplements used to treat a specific deficiency(vitamin B12, iron, folate, calcium, magnesium or potassium.)    -It is OK to take acetaminophen (Tylenol) if needed in the days leading up to surgery.       General Pre-Procedure Instructions  You will need a responsible adult to accompany you home from the hospital.  You will not be permitted to drive, take a taxi, bus, [place] or Lyft by yourself, as these do not count as reliable transportation. You will also need someone to stay with you for the first 24 hours after anesthesia.  We will ask you to provide the name, and a working phone number for your designated contact.  Please bring a photo ID and insurance information. If you are being admitted to the hospital after surgery, you may bring a small overnight bag, but please do not bring any valuables.  If you have an IMPLANTED DEVICE, BRING THE REMOTE CONTROL for it.  Examples include:  bladder stimulators, pain stimulators, INSPIRE, etc.  If you  have obstructive sleep apnea (OSA) and use a CPAP or BiPAP device, please bring this with you.  Pre-menopausal male patients will be screened and/or tested for pregnancy prior to your procedure given the potential risk of anesthesia and surgery to a fetus.  Please shower using the antiseptic soap (CHG/betacept) we provided during your visit today. You will use this soap for 5 uses prior to surgery, including the night before surgery and the morning of your surgery. You may use the antibacterial lotion provided today, but do not use any lotion the morning of surgery. Do not use other lotions, ointments, powders, creams, deodorant, perfume or cologne  once you start using CHG/betacept wash. Remember to complete the Pre op checklist regarding Preventing Surgical Site Infections with you on the morning of surgery.   and If your pre-op nasal swab was positive for Staph, or possibly MRSA, you will need to use Bactroban/Mupirocin intranasally twice daily for 5 days prior to surgery and the morning of surgery. You will be contacted by a [facility] nurse practitioner or registered nurse and a prescription will be called in to your pharmacy. This is used twice per day in each nostril via a Qtip.  Do not shave in the area of the surgery for 2 days prior to surgery.  If needed, a trained staff member will clip the area immediately before your surgery.  Stop smoking and/or vaping as far in advance of the surgery as possible.  This will help with the healing process.        Additional Instructions  SICK POLICY:    If you suspect that you have an illness/infection/rash currently, or, if you develop an illness or rash prior to surgery, please contact your surgeon immediately.  For questions please call [phone].  There is a nurse available Monday through Friday, 8 a.m. to 5:30 p.m.  The office is closed on weekends and holidays.  After hours you may leave a voicemail, and someone will return your call on the next business day.  In an EMERGENCY, if you must reach someone after our office is closed, you may call the same day surgery at [phone] from 5:30 to 8 p.m.    After 8 p.m., urgent calls only may go to the operating room desk at [phone].    MASK POLICY:  Patients, visitors and employees are NOT REQUIRED to wear a mask anywhere on [facility] premises (except [place]), and with some exceptions as noted below. Please read the below details and exceptions carefully.  While not required, we recommend everyone continues to wear a mask--and we highly recommend it in our emergency departments and for those unvaccinated for COVID--as its an effective way to protect yourself  from COVID, influenza and other respiratory infections.  Any patient can request that we wear a mask, and we ask that you please respect that request.    VISITOR POLICY:    Same day surgery is allowing no more than (two) 2 adult visitors per patient  at any time.    Prior to having any visitors, the patient will be checked in by the same day surgery nurse.   Children under the age of 15 are not permitted to visit.    Visiting hours in areas of the hospital will vary depending on specific unit, or floor of the hospital.           Day of Surgery Checklist:    The morning of your surgery:  ? Brush your teeth.   ?  Shower: the morning of surgery with an antibacterial soap, such as Dial. If given Chlorhexidine (CHG) wash or mupirocin/Bactroban nasal ointment, please use as instructed.   ? Do not use: powder, lotions, deodorant, perfume, and or cologne.   ? What to wear: Wear casual, loose fitting, and comfortable clothing.  ? Do NOT wear: Any make-up, jewelry, watch, body piercings, powders, perfumes/colognes, dark nail polish.  ? take medications listed above  ? DO NOT shave in the area of surgery    Bring with you:  ? Bring a list of your medications and dose including herbal and over-the-counter medications.   ? Photo ID  ? Insurance card   ? Glasses and case (do not wear contact lenses on the day of your surgery)  ? Dentures and case   ? Hearing aids and case  ? Crutches, walker, or cane if you have these items and will need them after surgery.  ? Specific medications as described above (rescue inhaler, transplant, seizure, Parkinson's and transplant medications)  ? CPAP or BiPAP machine and all associated equipment except water  ? Remote control for any implanted device (nerve stimulator, bladder stimulator, INSPIRE, etc)  ? Other:   ? For an overnight stay - pack a small bag of items that you may need (ex. change of clothing, toiletries, phone charger).  Locker space is limited in the surgery area.    Leave at  home:  ? Valuables:  We recommend that you leave valuables (ex. money, jewelry, credit cards) at home or with your family.   ? Contact lenses: Leave at home or bring a case for safe keeping.  ? Any make-up, jewelry, body piercings, powders, perfumes/colognes, dark nail polish  ? Please do not bring valuables such as money, jewelry or credit cards with you.

## 2021-10-18 ENCOUNTER — Ambulatory Visit: Payer: MEDICARE

## 2021-10-18 DIAGNOSIS — M4802 Spinal stenosis, cervical region: Secondary | ICD-10-CM

## 2021-10-18 LAB — POC GLU MONITORING DEVICE: POC Glucose Monitoring Device: 85 mg/dL (ref 70–100)

## 2021-10-18 MED ORDER — fentaNYL (SUBLIMAZE) injection 12.5 mcg
50 | INTRAMUSCULAR | PRN
Start: 2021-10-18 — End: 2021-10-19

## 2021-10-18 MED ORDER — remifentaniL (ULTIVA) 2 mg injection
2 | INTRAVENOUS | Status: AC
Start: 2021-10-18 — End: ?

## 2021-10-18 MED ORDER — gabapentin (NEURONTIN) capsule 300 mg
300 | ORAL | Status: AC | PRN
Start: 2021-10-18 — End: 2021-10-18
  Administered 2021-10-18: 20:00:00 300 mg via ORAL

## 2021-10-18 MED ORDER — lidocaine (PF) 2% (20 mg/mL) Soln 20 mg
20 | Freq: Once | INTRAMUSCULAR | Status: AC | PRN
Start: 2021-10-18 — End: 2021-10-18

## 2021-10-18 MED ORDER — HYDROmorphone (DILAUDID) injection Syrg 0.2 mg
0.5 | INTRAMUSCULAR | PRN
Start: 2021-10-18 — End: 2021-10-19

## 2021-10-18 MED ORDER — naloxone (NARCAN) injection 0.04 mg
0.4 | INTRAMUSCULAR | PRN
Start: 2021-10-18 — End: 2021-10-19

## 2021-10-18 MED ORDER — oxyCODONE (ROXICODONE) immediate release tablet 5 mg
5 | ORAL | PRN
Start: 2021-10-18 — End: 2021-10-19

## 2021-10-18 MED ORDER — ondansetron (ZOFRAN) injection 4 mg
4 | Freq: Three times a day (TID) | INTRAMUSCULAR | PRN
Start: 2021-10-18 — End: 2021-10-19

## 2021-10-18 MED ORDER — fentaNYL (SUBLIMAZE) injection 25 mcg
50 | INTRAMUSCULAR | PRN
Start: 2021-10-18 — End: 2021-10-19

## 2021-10-18 MED ORDER — acetaminophen (TYLENOL) tablet 975 mg
325 | ORAL | Status: AC | PRN
Start: 2021-10-18 — End: 2021-10-18
  Administered 2021-10-18: 20:00:00 975 mg via ORAL

## 2021-10-18 MED ORDER — glucose chewable tablet 12 g
4 | ORAL | PRN
Start: 2021-10-18 — End: 2021-10-19

## 2021-10-18 MED ORDER — vancomycin (VANCOCIN) 1,500 mg in sodium chloride 0.9% 250 mL ADDaptor IVPB
1.5 | INTRAVENOUS | Status: AC | PRN
Start: 2021-10-18 — End: 2021-10-18
  Administered 2021-10-18: 20:00:00 1500 mg/kg via INTRAVENOUS

## 2021-10-18 MED ORDER — dextrose 10%-water (D10W) IV soln
INTRAVENOUS | PRN
Start: 2021-10-18 — End: 2021-10-19

## 2021-10-18 MED ORDER — oxyCODONE (ROXICODONE) immediate release tablet 2.5 mg
5 | ORAL | PRN
Start: 2021-10-18 — End: 2021-10-19

## 2021-10-18 MED ORDER — lactated Ringers infusion
INTRAVENOUS | Status: AC
Start: 2021-10-18 — End: 2021-10-19

## 2021-10-18 MED ORDER — HYDROmorphone (DILAUDID) injection Syrg 0.5 mg
0.5 | INTRAMUSCULAR | PRN
Start: 2021-10-18 — End: 2021-10-19

## 2021-10-18 MED ORDER — ceFAZolin (ANCEF) 2 g in sodium chloride 0.9% 100 mL ADDaptor IVPB
2 | INTRAVENOUS | PRN
Start: 2021-10-18 — End: 2021-10-19

## 2021-10-18 MED FILL — GABAPENTIN 300 MG CAPSULE: 300 300 MG | ORAL | Qty: 1

## 2021-10-18 MED FILL — CEFAZOLIN 2 GRAM SOLUTION FOR INJECTION: 2 2 gram | INTRAMUSCULAR | Qty: 1

## 2021-10-18 MED FILL — ULTIVA 2 MG INTRAVENOUS SOLUTION: 2 2 mg | INTRAVENOUS | Qty: 5

## 2021-10-18 MED FILL — VANCOMYCIN 1.5 GRAM INTRAVENOUS SOLUTION: 1.5 1.5 gram | INTRAVENOUS | Qty: 1

## 2021-10-18 MED FILL — TYLENOL 325 MG TABLET: 325 325 mg | ORAL | Qty: 3

## 2021-10-18 NOTE — Unmapped (Signed)
Neurosurgery Brief Note:    Patient's surgery delayed so given the timing will reschedule for tmw. Patient made aware and in agreement. Will be NPO tmw for planned surgery tmw afternoon.    Saarah Dewing L. Charlesetta GaribaldiGaulden, MD   Neurosurgery Resident   5:13 PM 10/18/2021

## 2021-10-18 NOTE — Unmapped (Signed)
1715, Dr Sena Hitch communicated with RN that pt's procedure is being cancelled and pt reschedule for tomorrow. Pt and wife notified per Dr Sena Hitch.     1730 Dr Romie Minus at bedside to update pt. IV discontinued and po fluid offered. Per MD pt can go home.    1738, pt and wife left in a good condition.

## 2021-10-18 NOTE — Unmapped (Signed)
[place]  DEPARTMENT OF ANESTHESIOLOGY  PRE-PROCEDURAL EVALUATION    Matthew [name] is a 76 y.o. year old male presenting for:    Procedure(s):  C3-C4 ANTERIOR CERVICAL DECOMPRESSION AND FUSION.    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       No history of anesthetic complications   I have reviewed the History and Physical Exam, any relevant changes are noted in the anesthesia pre-operative evaluation.      Cardiovascular:    Exercise tolerance: good  Duke Met score: 5 - Walking four miles per hour. Social dancing. Washing a car.  (+) hyperlipidemia (on crestor).    Hypertension (takes amlodipine) is well controlled.      (-) angina.    Neuro/Muscoloskeletal/Psych:    (+) neuromuscular disease (esophageal dysphagia, cervical myelopathy).      (-) seizures, TIA, back problems, no anxiety, no depression.     Pulmonary:          Sleep apnea (does not use CPAP).    (-) no pneumonia.       GI/Hepatic/Renal:            (-) GERD, liver disease, renal disease.    Endo/Other:          (-) diabetes mellitus, hypothyroidism, no anemia, no DVT, no steroid use.     Comments:   CT coronary calcium scoring 09/03/2021  IMPRESSION:   1. Total calcium score of 483, implying extensive atherosclerotic plaque. There is a high likelihood of at least one significant coronary narrowing.      MRI c-spine 07/14/2021  IMPRESSION:   1. 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 Severe left C4 foraminal stenosis.   2.  Degenerative changes other levels without additional cord compression. Mild to moderate foraminal narrowing as described.    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    Environmental allergies     Hearing loss     left (since age 60), right more recent    High cholesterol     Hypertension     Neck symptom     Trouble turning head to the left     Sleep apnea     Sleep disorder        Past Surgical History     Past Surgical History:   Procedure Laterality Date    ACHILLES TENDON REPAIR  01/31/2002    left     APPENDECTOMY      HERNIA REPAIR  02/01/1951    KNEE SURGERY Left 1976    TONSILLECTOMY      WISDOM TOOTH EXTRACTION         Family History     Family History   Problem Relation Age of Onset    Colon Cancer Mother     Heart failure Father        Social History     Social History     Socioeconomic History    Marital status: Married     Spouse name: Not on file  Number of children: Not on file    Years of education: Not on file    Highest education level: Not on file   Occupational History    Not on file   Tobacco Use    Smoking status: Never    Smokeless tobacco: Never   Vaping Use    Vaping Use: Never used   Substance and Sexual Activity    Alcohol use: Yes     Alcohol/week: 4.0 standard drinks     Types: 2 Glasses of wine, 2 Cans of beer per week    Drug use: No     Comment: 02/16/10    Sexual activity: Not on file   Other Topics Concern    Caffeine Use Yes    Occupational Exposure Not Asked    Exercise Yes    Seat Belt Yes   Social History Narrative    Not on file     Social Determinants of Health     Financial Resource Strain: Not on file   Physical Activity: Not on file   Stress: Not on file   Social Connections: Not on file   Housing Stability: Not on file       Medications     Allergies:  Allergies   Allergen Reactions    Animal Dander      Causes congestion    Adhesive Tape-Silicones Rash       Home Meds:  Prior to Admission medications as of 10/18/21 1326   Medication Sig Taking?   ALPHA LIPOIC ACID ORAL Take by mouth. Yes   amLODIPine (NORVASC) 5 MG tablet Take 1 tablet (5 mg total) by mouth daily. Yes    baclofen (LIORESAL) 10 MG tablet Take 1 tablet (10 mg total) by mouth. Yes   brimonidine (ALPHAGAN) 0.2 % ophthalmic solution Place 1 drop into the right eye 2 times a day. Yes   cetirizine (ZYRTEC) 10 MG tablet Take 1 tablet (10 mg total) by mouth daily. Yes   doxepin (SINEQUAN) 10 MG capsule Take 1 capsule (10 mg total) by mouth at bedtime. Yes   finasteride (PROSCAR) 5 mg tablet  Yes   gabapentin (NEURONTIN) 600 MG tablet Take 1 tablet (600 mg total) by mouth 3 times a day.  Patient taking differently: Take 1 tablet (600 mg total) by mouth daily. Yes   omega-3 acid ethyl esters (LOVAZA) 1 gram capsule Take 2 capsules (2 g total) by mouth daily. Yes   rosuvastatin (CRESTOR) 10 MG tablet Take 1 tablet (10 mg total) by mouth every evening. Yes   tadalafiL (CIALIS) 20 MG tablet Take by mouth. Yes   timolol (TIMOPTIC) 0.5 % ophthalmic solution INSTILL 1 DROP INTO BOTH EYES EVERY 12 HOURS Yes   azelastine (ASTELIN) 137 mcg (0.1 %) nasal spray Use 2 sprays into each nostril 2 times a day.    LUMIGAN 0.01 % Drop         Inpatient Meds:  Scheduled:   Continuous:    lactated Ringers         PRN: acetaminophen, ceFAZolin (ANCEF) IVPB **AND** vancomycin, dextrose 10% in water **OR** dextrose 10% in water, glucose, lidocaine (PF) 2% (20 mg/mL)    Vital Signs     Wt Readings from Last 3 Encounters:   10/18/21 175 lb (79.4 kg)   10/14/21 179 lb 4.8 oz (81.3 kg)   10/13/21 181 lb (82.1 kg)     Ht Readings from Last 3 Encounters:   10/18/21 5' 11 (1.803 m)  10/14/21 5' 11 (1.803 m)   10/13/21 5' 11 (1.803 m)     Temp Readings from Last 3 Encounters:   10/18/21 98 F (36.7 C) (Temporal)   10/14/21 98.4 F (36.9 C) (Oral)   10/13/21 97 F (36.1 C) (Temporal)     BP Readings from Last 3 Encounters:   10/18/21 148/75   10/14/21 122/77   10/13/21 157/74     Pulse Readings from Last 3 Encounters:   10/18/21 54   10/14/21 57   10/13/21 64     @LASTSAO2 (3)@    Physical Exam     Airway:     Mallampati: II  Mouth Opening: >2  FB  TM distance: > = 3 FB  Neck ROM: full    Dental:   - No obvious cracked, loose, chipped, or missing teeth.     Pulmonary:           (-) no rhonchi and no rales.    Cardiovascular:     Rhythm: regular  Rate: normal  (-) murmur and peripheral edema.    Neuro/Musculoskeletal/Psych:     Mental status: alert and oriented to person, place and time.          Abdominal:       Current OB Status:       Other Findings:      Laboratory Data     Lab Results   Component Value Date    WBC 6.7 10/14/2021    HGB 13.1 (L) 10/14/2021    HCT 37.6 (L) 10/14/2021    MCV 94.6 10/14/2021    PLT 141 10/14/2021       No results found for: [facility]    Lab Results   Component Value Date    GLUCOSE 84 10/14/2021    BUN 22 10/14/2021    CO2 31 10/14/2021    CREATININE 0.85 10/14/2021    K 3.9 10/14/2021    NA 143 10/14/2021    CL 105 10/14/2021    CALCIUM 9.5 10/14/2021    ALBUMIN 4.2 10/14/2021    PROT 6.8 10/14/2021    ALKPHOS 66 10/14/2021    ALT 24 10/14/2021    AST 24 10/14/2021    BILITOT 0.7 10/14/2021       Lab Results   Component Value Date    INR 1.0 10/14/2021       No results found for: PREGTESTUR, PREGSERUM, HCG, HCGQUANT    Anesthesia Plan     ASA 3         Current non-smoker    Anesthesia Type:  general endotracheal.      PONV Risk Factors: current non-smoker,  plan for postoperative opioid use.              Induction:   Intravenous induction.    (PIV, PACU postop.  IV opioids and tylenol for postop pain control)  Anesthetic plan and risks discussed with patient.    Plan, alternatives, and risks of anesthesia, including death, have been explained to and discussed with the patient/legal guardian.  By my assessment, the patient/legal guardian understands and agrees.  Scenario presented in detail.  Questions answered.    Use of blood products discussed with patient who consented to blood products.        Plan discussed with CRNA, RNSA and attending.

## 2021-10-18 NOTE — Unmapped (Signed)
H&P reviewed, patient examined, no changes to H&P.    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 MRI demonstrated a large C3-4 disc herniation with anterior osteophyte.     Exam:  *GEN: Alert, appropriate, NAD  *EYES: Conjugate   *HEENT: NC/AT   *Neck: NT, ROM full   *C/V: extremities warm and well perfused   *CHEST: Breathing unlabored   *ABD: Nontender     *NEURO:  -MS:  AA&Ox3, speech fluent and appropriate  -CN:   PERRL, EOMi, no facial weakness, no dysarthria, tongue midline  -Motor:          D EE EF HI FF G   RUE:    5 5 5 5 5 5     LUE:     5 5 5 5 5 5     HF KE KF DF PF EHL   RLE: 5 5 5 5 5 5    LLE: 5 5 5 5 5 5   -Sensation: SILT and symmetric x4  -Reflex:  2+ and symmetric    Plan:  - OR for C3-4 ACDF with ENT assistance for exposure  - 24h obs vs discharge home post-op    Gerrit Heck, MD  Neurosurgery Resident (Pager [phone])  4:19 PM 10/18/2021

## 2021-10-18 NOTE — Unmapped (Addendum)
[place]  DEPARTMENT OF ANESTHESIOLOGY  PRE-PROCEDURAL EVALUATION    Matthew [name] is a 76 y.o. year old male presenting for:    Procedure(s):  C3-C4 ANTERIOR CERVICAL DECOMPRESSION AND FUSION.    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       No history of anesthetic complications   I have reviewed the History and Physical Exam, any relevant changes are noted in the anesthesia pre-operative evaluation.      Cardiovascular:    Exercise tolerance: good  Duke Met score: 5 - Walking four miles per hour. Social dancing. Washing a car.  (+) hyperlipidemia (on crestor).    Hypertension (takes amlodipine) is well controlled.      (-) angina.    Neuro/Muscoloskeletal/Psych:    (+) neuromuscular disease (esophageal dysphagia, cervical myelopathy).      (-) seizures, TIA, back problems, no anxiety, no depression.     Pulmonary:          Sleep apnea (does not use CPAP).    (-) no pneumonia.       GI/Hepatic/Renal:            (-) GERD, liver disease, renal disease.    Endo/Other:          (-) diabetes mellitus, hypothyroidism, no anemia, no DVT, no steroid use.     Comments:   CT coronary calcium scoring 09/03/2021  IMPRESSION:   1. Total calcium score of 483, implying extensive atherosclerotic plaque. There is a high likelihood of at least one significant coronary narrowing.      MRI c-spine 07/14/2021  IMPRESSION:   1. 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 Severe left C4 foraminal stenosis.   2.  Degenerative changes other levels without additional cord compression. Mild to moderate foraminal narrowing as described.    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    Environmental allergies     Hearing loss     left (since age 60), right more recent    High cholesterol     Hypertension     Neck symptom     Trouble turning head to the left     Sleep apnea     Sleep disorder        Past Surgical History     Past Surgical History:   Procedure Laterality Date    ACHILLES TENDON REPAIR  01/31/2002    left     APPENDECTOMY      HERNIA REPAIR  02/01/1951    KNEE SURGERY Left 1976    TONSILLECTOMY      WISDOM TOOTH EXTRACTION         Family History     Family History   Problem Relation Age of Onset    Colon Cancer Mother     Heart failure Father        Social History     Social History     Socioeconomic History    Marital status: Married     Spouse name: Not on file  Number of children: Not on file    Years of education: Not on file    Highest education level: Not on file   Occupational History    Not on file   Tobacco Use    Smoking status: Never    Smokeless tobacco: Never   Vaping Use    Vaping Use: Never used   Substance and Sexual Activity    Alcohol use: Yes     Alcohol/week: 4.0 standard drinks     Types: 2 Glasses of wine, 2 Cans of beer per week    Drug use: No     Comment: 02/16/10    Sexual activity: Not on file   Other Topics Concern    Caffeine Use Yes    Occupational Exposure Not Asked    Exercise Yes    Seat Belt Yes   Social History Narrative    Not on file     Social Determinants of Health     Financial Resource Strain: Not on file   Physical Activity: Not on file   Stress: Not on file   Social Connections: Not on file   Housing Stability: Not on file       Medications     Allergies:  Allergies   Allergen Reactions    Animal Dander      Causes congestion    Adhesive Tape-Silicones Rash       Home Meds:  Prior to Admission medications as of 10/18/21 1326   Medication Sig Taking?   ALPHA LIPOIC ACID ORAL Take by mouth. Yes   amLODIPine (NORVASC) 5 MG tablet Take 1 tablet (5 mg total) by mouth daily. Yes    baclofen (LIORESAL) 10 MG tablet Take 1 tablet (10 mg total) by mouth. Yes   brimonidine (ALPHAGAN) 0.2 % ophthalmic solution Place 1 drop into the right eye 2 times a day. Yes   cetirizine (ZYRTEC) 10 MG tablet Take 1 tablet (10 mg total) by mouth daily. Yes   doxepin (SINEQUAN) 10 MG capsule Take 1 capsule (10 mg total) by mouth at bedtime. Yes   finasteride (PROSCAR) 5 mg tablet  Yes   gabapentin (NEURONTIN) 600 MG tablet Take 1 tablet (600 mg total) by mouth 3 times a day.  Patient taking differently: Take 1 tablet (600 mg total) by mouth daily. Yes   omega-3 acid ethyl esters (LOVAZA) 1 gram capsule Take 2 capsules (2 g total) by mouth daily. Yes   rosuvastatin (CRESTOR) 10 MG tablet Take 1 tablet (10 mg total) by mouth every evening. Yes   tadalafiL (CIALIS) 20 MG tablet Take by mouth. Yes   timolol (TIMOPTIC) 0.5 % ophthalmic solution INSTILL 1 DROP INTO BOTH EYES EVERY 12 HOURS Yes   azelastine (ASTELIN) 137 mcg (0.1 %) nasal spray Use 2 sprays into each nostril 2 times a day.    LUMIGAN 0.01 % Drop         Inpatient Meds:  Scheduled:   Continuous:    lactated Ringers         PRN: acetaminophen, ceFAZolin (ANCEF) IVPB **AND** vancomycin, dextrose 10% in water **OR** dextrose 10% in water, glucose, lidocaine (PF) 2% (20 mg/mL)    Vital Signs     Wt Readings from Last 3 Encounters:   10/18/21 175 lb (79.4 kg)   10/14/21 179 lb 4.8 oz (81.3 kg)   10/13/21 181 lb (82.1 kg)     Ht Readings from Last 3 Encounters:   10/18/21 5' 11 (1.803 m)  10/14/21 5' 11 (1.803 m)   10/13/21 5' 11 (1.803 m)     Temp Readings from Last 3 Encounters:   10/18/21 98 F (36.7 C) (Temporal)   10/14/21 98.4 F (36.9 C) (Oral)   10/13/21 97 F (36.1 C) (Temporal)     BP Readings from Last 3 Encounters:   10/18/21 148/75   10/14/21 122/77   10/13/21 157/74     Pulse Readings from Last 3 Encounters:   10/18/21 54   10/14/21 57   10/13/21 64     @LASTSAO2 (3)@    Physical Exam     Airway:     Mallampati: II  Mouth Opening: >2  FB  TM distance: > = 3 FB  Neck ROM: full    Dental:   - No obvious cracked, loose, chipped, or missing teeth.     Pulmonary:           (-) no rhonchi and no rales.    Cardiovascular:     Rhythm: regular  Rate: normal  (-) murmur and peripheral edema.    Neuro/Musculoskeletal/Psych:     Mental status: alert and oriented to person, place and time.          Abdominal:       Current OB Status:       Other Findings:      Laboratory Data     Lab Results   Component Value Date    WBC 6.7 10/14/2021    HGB 13.1 (L) 10/14/2021    HCT 37.6 (L) 10/14/2021    MCV 94.6 10/14/2021    PLT 141 10/14/2021       No results found for: [facility]    Lab Results   Component Value Date    GLUCOSE 84 10/14/2021    BUN 22 10/14/2021    CO2 31 10/14/2021    CREATININE 0.85 10/14/2021    K 3.9 10/14/2021    NA 143 10/14/2021    CL 105 10/14/2021    CALCIUM 9.5 10/14/2021    ALBUMIN 4.2 10/14/2021    PROT 6.8 10/14/2021    ALKPHOS 66 10/14/2021    ALT 24 10/14/2021    AST 24 10/14/2021    BILITOT 0.7 10/14/2021       Lab Results   Component Value Date    INR 1.0 10/14/2021       No results found for: PREGTESTUR, PREGSERUM, HCG, HCGQUANT    Anesthesia Plan     ASA 3         Current non-smoker    Anesthesia Type:  general endotracheal.      PONV Risk Factors: current non-smoker,  plan for postoperative opioid use.              Induction:   Intravenous induction.    (PIV, PACU postop.  IV opioids and tylenol for postop pain control)  Anesthetic plan and risks discussed with patient.    Plan, alternatives, and risks of anesthesia, including death, have been explained to and discussed with the patient/legal guardian.  By my assessment, the patient/legal guardian understands and agrees.  Scenario presented in detail.  Questions answered.    Use of blood products discussed with patient who consented to blood products.        Plan discussed with CRNA, RNSA and attending.

## 2021-10-19 ENCOUNTER — Ambulatory Visit: Admit: 2021-10-19 | Payer: MEDICARE

## 2021-10-19 ENCOUNTER — Observation Stay: Admit: 2021-10-19 | Discharge: 2021-10-20 | Disposition: A | Discharge status: Home / Self Care | Payer: MEDICARE

## 2021-10-19 LAB — POC GLU MONITORING DEVICE
POC Glucose Monitoring Device: 94 mg/dL (ref 70–100)
POC Glucose Monitoring Device: 119 mg/dL (ref 70–100)

## 2021-10-19 LAB — COMPREHENSIVE METABOLIC PANEL
ALT: 23 U/L (ref 7–52)
AST: 23 U/L (ref 13–39)
Albumin: 4.3 g/dL (ref 3.5–5.7)
Alkaline Phosphatase: 73 U/L (ref 36–125)
Anion Gap: 11 mmol/L (ref 3–16)
BUN: 20 mg/dL (ref 7–25)
CO2: 28 mmol/L (ref 21–33)
Calcium: 9.6 mg/dL (ref 8.6–10.3)
Chloride: 105 mmol/L (ref 98–110)
Creatinine: 0.97 mg/dL (ref 0.60–1.30)
EGFR: 82
Glucose: 124 mg/dL — ABNORMAL HIGH (ref 70–100)
Osmolality, Calculated: 302 mosm/kg (ref 278–305)
Potassium: 3.8 mmol/L (ref 3.5–5.3)
Sodium: 144 mmol/L (ref 133–146)
Total Bilirubin: 0.7 mg/dL (ref 0.0–1.5)
Total Protein: 7.1 g/dL (ref 6.4–8.9)

## 2021-10-19 LAB — CBC
Hematocrit: 40 % (ref 38.5–50.0)
Hemoglobin: 13.8 g/dL (ref 13.2–17.1)
MCH: 32.5 pg (ref 27.0–33.0)
MCHC: 34.6 g/dL (ref 32.0–36.0)
MCV: 94 fL (ref 80.0–100.0)
MPV: 9.2 fL (ref 7.5–11.5)
Platelets: 139 10*3/uL (ref 140–400)
RBC: 4.25 10*6/uL (ref 4.20–5.80)
RDW: 12.9 % (ref 11.0–15.0)
WBC: 8.9 10*3/uL (ref 3.8–10.8)

## 2021-10-19 LAB — PHOSPHORUS: Phosphorus: 2.1 mg/dL (ref 2.1–4.5)

## 2021-10-19 LAB — MAGNESIUM: Magnesium: 2.2 mg/dL (ref 1.5–2.5)

## 2021-10-19 LAB — ABO/RH: Rh Type: POSITIVE

## 2021-10-19 MED ORDER — finasteride (PROSCAR) tablet 5 mg
5 | Freq: Every day | ORAL
Start: 2021-10-19 — End: 2021-10-20
  Administered 2021-10-20: 13:00:00 5 mg via ORAL

## 2021-10-19 MED ORDER — brimonidine (ALPHAGAN) 0.2 % ophthalmic solution 1 drop
0.2 | Freq: Two times a day (BID) | OPHTHALMIC
Start: 2021-10-19 — End: 2021-10-20
  Administered 2021-10-20 (×2): 1 [drp] via OPHTHALMIC

## 2021-10-19 MED ORDER — remifentaniL (ULTIVA) 2 mg in sodium chloride 0.9 % 40 mL IV
1 | INTRAVENOUS | PRN
Start: 2021-10-19 — End: 2021-10-19
  Administered 2021-10-19: 17:00:00 .125 via INTRAVENOUS

## 2021-10-19 MED ORDER — fentaNYL (SUBLIMAZE) injection 25 mcg
50 | INTRAMUSCULAR | PRN
Start: 2021-10-19 — End: 2021-10-19
  Administered 2021-10-19: 22:00:00 25 ug via INTRAVENOUS

## 2021-10-19 MED ORDER — ondansetron (ZOFRAN) injection 4 mg
4 | Freq: Four times a day (QID) | INTRAMUSCULAR | PRN
Start: 2021-10-19 — End: 2021-10-20

## 2021-10-19 MED ORDER — HYDROmorphone (DILAUDID) injection Syrg 0.2 mg
0.5 | INTRAMUSCULAR | PRN
Start: 2021-10-19 — End: 2021-10-19

## 2021-10-19 MED ORDER — senna-docusate (SENNA-S) 8.6-50 mg per tablet 1 tablet
8.6-50 | Freq: Two times a day (BID) | ORAL
Start: 2021-10-19 — End: 2021-10-20
  Administered 2021-10-20 (×2): 1 via ORAL

## 2021-10-19 MED ORDER — lactated Ringers infusion
INTRAVENOUS | PRN
Start: 2021-10-19 — End: 2021-10-19
  Administered 2021-10-19: 17:00:00 via INTRAVENOUS

## 2021-10-19 MED ORDER — vancomycin (VANCOCIN) 1,500 mg in sodium chloride 0.9% 250 mL ADDaptor IVPB
1.5 | INTRAVENOUS | Status: AC | PRN
Start: 2021-10-19 — End: 2021-10-19
  Administered 2021-10-19: 17:00:00 1500 mg via INTRAVENOUS

## 2021-10-19 MED ORDER — amLODIPine (NORVASC) tablet 5 mg
5 | Freq: Every day | ORAL
Start: 2021-10-19 — End: 2021-10-20
  Administered 2021-10-20: 13:00:00 5 mg via ORAL

## 2021-10-19 MED ORDER — propofol (DIPRIVAN) infusion 10 mg/mL
10 | INTRAVENOUS | PRN
Start: 2021-10-19 — End: 2021-10-19
  Administered 2021-10-19: 17:00:00 150 via INTRAVENOUS

## 2021-10-19 MED ORDER — naloxone (NARCAN) injection 0.04 mg
0.4 | INTRAMUSCULAR | PRN
Start: 2021-10-19 — End: 2021-10-19

## 2021-10-19 MED ORDER — proMETHazine (PHENERGAN) injection 6.25 mg
25 | Freq: Four times a day (QID) | INTRAMUSCULAR | PRN
Start: 2021-10-19 — End: 2021-10-19

## 2021-10-19 MED ORDER — ePHEDrine sulfate 50 mg/mL IV solution
50 | INTRAVENOUS | Status: AC
Start: 2021-10-19 — End: ?

## 2021-10-19 MED ORDER — dexamethasone (DECADRON) injection
10 | INTRAMUSCULAR | PRN
Start: 2021-10-19 — End: 2021-10-19
  Administered 2021-10-19: 18:00:00 8 via INTRAVENOUS

## 2021-10-19 MED ORDER — ceFAZolin (ANCEF) 2 g in sodium chloride 0.9% 100 mL ADDaptor IVPB
2 | INTRAVENOUS | Status: AC | PRN
Start: 2021-10-19 — End: 2021-10-19
  Administered 2021-10-19: 17:00:00 2 g via INTRAVENOUS

## 2021-10-19 MED ORDER — oxyCODONE (ROXICODONE) immediate release tablet 2.5 mg
5 | ORAL | PRN
Start: 2021-10-19 — End: 2021-10-19

## 2021-10-19 MED ORDER — doxepin (SINEQUAN) capsule 10 mg
10 | Freq: Every evening | ORAL
Start: 2021-10-19 — End: 2021-10-20
  Administered 2021-10-20: 02:00:00 10 mg via ORAL

## 2021-10-19 MED ORDER — ondansetron (ZOFRAN) injection 4 mg
4 | Freq: Three times a day (TID) | INTRAMUSCULAR | PRN
Start: 2021-10-19 — End: 2021-10-19

## 2021-10-19 MED ORDER — atorvastatin (LIPITOR) tablet 40 mg
20 | Freq: Every evening | ORAL
Start: 2021-10-19 — End: 2021-10-20
  Administered 2021-10-20: 02:00:00 40 mg via ORAL

## 2021-10-19 MED ORDER — lidocaine (PF) 2% (20 mg/mL) Soln
20 | INTRAMUSCULAR | PRN
Start: 2021-10-19 — End: 2021-10-19
  Administered 2021-10-19: 17:00:00 100 via INTRAVENOUS

## 2021-10-19 MED ORDER — fentaNYL (SUBLIMAZE) 50 mcg/mL injection
50 | INTRAMUSCULAR | Status: AC
Start: 2021-10-19 — End: ?

## 2021-10-19 MED ORDER — lidocaine (PF) 2% (20 mg/mL) Soln 20 mg
20 | Freq: Once | INTRAMUSCULAR | PRN
Start: 2021-10-19 — End: 2021-10-19

## 2021-10-19 MED ORDER — remifentaniL (ULTIVA) 1 mg injection
1 | INTRAVENOUS | Status: AC
Start: 2021-10-19 — End: ?

## 2021-10-19 MED ORDER — fentaNYL (SUBLIMAZE) injection
50 | INTRAMUSCULAR | PRN
Start: 2021-10-19 — End: 2021-10-19
  Administered 2021-10-19: 17:00:00 50 via INTRAVENOUS

## 2021-10-19 MED ORDER — bisacodyL (DULCOLAX) suppository 10 mg
10 | Freq: Every day | RECTAL | PRN
Start: 2021-10-19 — End: 2021-10-20

## 2021-10-19 MED ORDER — glucose chewable tablet 12 g
4 | ORAL | PRN
Start: 2021-10-19 — End: 2021-10-19

## 2021-10-19 MED ORDER — HYDROmorphone (DILAUDID) injection Syrg 0.5 mg
0.5 | INTRAMUSCULAR | PRN
Start: 2021-10-19 — End: 2021-10-19
  Administered 2021-10-19 (×2): 0.5 mg via INTRAVENOUS

## 2021-10-19 MED ORDER — propofol 10 mg/ml (DIPRIVAN) injection
10 | INTRAVENOUS | PRN
Start: 2021-10-19 — End: 2021-10-19
  Administered 2021-10-19: 17:00:00 160 via INTRAVENOUS

## 2021-10-19 MED ORDER — dextrose 10%-water (D10W) IV soln
INTRAVENOUS | PRN
Start: 2021-10-19 — End: 2021-10-19

## 2021-10-19 MED ORDER — gabapentin (NEURONTIN) capsule 600 mg
300 | Freq: Three times a day (TID) | ORAL
Start: 2021-10-19 — End: 2021-10-20
  Administered 2021-10-20 (×3): 600 mg via ORAL

## 2021-10-19 MED ORDER — oxyCODONE (ROXICODONE) immediate release tablet 5 mg
5 | ORAL | PRN
Start: 2021-10-19 — End: 2021-10-19
  Administered 2021-10-19: 22:00:00 5 mg via ORAL

## 2021-10-19 MED ORDER — phenylephrine (NEO-SYNEPHRINE) 10 mg in sodium chloride 0.9 % 100 mL infusion
10 | INTRAMUSCULAR | PRN
Start: 2021-10-19 — End: 2021-10-19
  Administered 2021-10-19: 18:00:00 30 via INTRAVENOUS

## 2021-10-19 MED ORDER — SUFentanil (SUFENTA) 50 mcg/mL injection
50 | INTRAVENOUS | Status: AC
Start: 2021-10-19 — End: ?

## 2021-10-19 MED ORDER — ePHEDrine sulfate IV solution
50 | INTRAVENOUS | PRN
Start: 2021-10-19 — End: 2021-10-19
  Administered 2021-10-19 (×2): 5 via INTRAVENOUS

## 2021-10-19 MED ORDER — electrolyte-R (pH 7.4) (NORMOSOL-R pH 7.4) IV solution
INTRAVENOUS | PRN
Start: 2021-10-19 — End: 2021-10-19
  Administered 2021-10-19: 18:00:00 via INTRAVENOUS

## 2021-10-19 MED ORDER — succinylcholine (QUELICIN) injection
20 | INTRAMUSCULAR | PRN
Start: 2021-10-19 — End: 2021-10-19
  Administered 2021-10-19: 17:00:00 160 via INTRAVENOUS

## 2021-10-19 MED ORDER — acetaminophen (TYLENOL) tablet 975 mg
325 | ORAL | Status: AC | PRN
Start: 2021-10-19 — End: 2021-10-19
  Administered 2021-10-19: 17:00:00 975 mg via ORAL

## 2021-10-19 MED ORDER — cetirizine (ZYRTEC) tablet 10 mg
10 | Freq: Every day | ORAL
Start: 2021-10-19 — End: 2021-10-20
  Administered 2021-10-20: 13:00:00 10 mg via ORAL

## 2021-10-19 MED ORDER — fentaNYL (SUBLIMAZE) injection 12.5 mcg
50 | INTRAMUSCULAR | PRN
Start: 2021-10-19 — End: 2021-10-19

## 2021-10-19 MED ORDER — gabapentin (NEURONTIN) capsule 600 mg
300 | ORAL | Status: AC | PRN
Start: 2021-10-19 — End: 2021-10-19
  Administered 2021-10-19: 17:00:00 600 mg via ORAL

## 2021-10-19 MED ORDER — ondansetron (ZOFRAN) injection
4 | INTRAMUSCULAR | PRN
Start: 2021-10-19 — End: 2021-10-19
  Administered 2021-10-19: 21:00:00 4 via INTRAVENOUS

## 2021-10-19 MED ORDER — propofol 10 mg/ml (DIPRIVAN) 10 mg/mL injection
10 | INTRAVENOUS | Status: AC
Start: 2021-10-19 — End: ?

## 2021-10-19 MED ORDER — dexamethasone (DECADRON) injection 4 mg
4 | Freq: Once | INTRAMUSCULAR | PRN
Start: 2021-10-19 — End: 2021-10-19

## 2021-10-19 MED FILL — HYDROMORPHONE 0.5 MG/0.5 ML INJECTION SYRINGE: 0.5 0.5 mg/0.5 mL | INTRAMUSCULAR | Qty: 0.5

## 2021-10-19 MED FILL — BRIMONIDINE 0.2 % EYE DROPS: 0.2 0.2 % | OPHTHALMIC | Qty: 5

## 2021-10-19 MED FILL — TYLENOL 325 MG TABLET: 325 325 mg | ORAL | Qty: 3

## 2021-10-19 MED FILL — VANCOMYCIN 1.5 GRAM INTRAVENOUS SOLUTION: 1.5 1.5 gram | INTRAVENOUS | Qty: 1

## 2021-10-19 MED FILL — SUFENTANIL CITRATE 50 MCG/ML INTRAVENOUS SOLUTION: 50 50 mcg/mL | INTRAVENOUS | Qty: 5

## 2021-10-19 MED FILL — GABAPENTIN 300 MG CAPSULE: 300 300 MG | ORAL | Qty: 2

## 2021-10-19 MED FILL — EPHEDRINE SULFATE 50 MG/ML INTRAVENOUS SOLUTION: 50 50 mg/mL | INTRAVENOUS | Qty: 1

## 2021-10-19 MED FILL — ULTIVA 1 MG INTRAVENOUS SOLUTION: 1 1 mg | INTRAVENOUS | Qty: 1

## 2021-10-19 MED FILL — DIPRIVAN 10 MG/ML INTRAVENOUS EMULSION: 10 10 mg/mL | INTRAVENOUS | Qty: 20

## 2021-10-19 MED FILL — FENTANYL (PF) 50 MCG/ML INJECTION SOLUTION: 50 50 mcg/mL | INTRAMUSCULAR | Qty: 2

## 2021-10-19 MED FILL — OXYCODONE 5 MG TABLET: 5 5 MG | ORAL | Qty: 1

## 2021-10-19 MED FILL — CEFAZOLIN 2 GRAM SOLUTION FOR INJECTION: 2 2 gram | INTRAMUSCULAR | Qty: 1

## 2021-10-19 NOTE — Unmapped (Signed)
C3-C4 ANTERIOR CERVICAL DECOMPRESSION AND FUSION.  Brief Op Note  Matthew [name]  10/19/2021      Pre-op Diagnosis: Cervical spondylosis with myelopathDerald [name] [M47.12]  Esophageal dysphagia [R13.19]       Post-op Diagnosis: same    Procedure(s):  C3-C4 ANTERIOR CERVICAL DECOMPRESSION AND FUSION.      Surgeon(s):  Denyse DagoJoseph S Cheng, MD  Kallie LocksYash Patil, MD  Penni BombardSameer Patel, MD  Danice Goltzaniel B Webb, MD  Cordelia Pochehristiana Cherice Glennie, MD    Anesthesia: General    Staff:   Circulator: Marcie MowersKeri Schultz, RN  Scrub Person: Burnard HawthorneJered Hall, RN    Estimated Blood Loss:  100mL                 Specimens: none       Drains:   IUC (Foley) Temperature probe 16 Fr. (Active)   Number of days: 0   7 Flat JP drain      There were no complications unless listed below.       [name]     Date: 10/19/2021  Time: 4:54 PM

## 2021-10-19 NOTE — Unmapped (Signed)
Anesthesia Transfer of Care Note    Patient: Matthew [name]  Procedure(s) Performed: Procedure(s):  C3-C4 ANTERIOR CERVICAL DECOMPRESSION AND FUSION.    Patient location: PACU    Anesthesia type: general    Airway Device on Arrival to PACU/ICU: Venti-mask    IV Access: Peripheral    Monitors Recommended to be Used During PACU/ICU: Standard Monitors    Outstanding Issues to Address: None    Level of Consciousness: awake    Post vital signs:    Vitals:    10/19/21 1700   BP: 130/71   Pulse: 70   Resp: 12   Temp: 97.5 F   SpO2: 100%     Report to PACU RN. Patient resting comfortably in bed with patent airway on O2 via simple face mask, VSS    Complications:  No notable events documented.    Date 10/18/21 1500 - 10/19/21 0659(Not Admitted) 10/19/21 0700 - 10/20/21 0659   Shift 1500-2259 2300-0659 24 Hour Total 0700-1459 1500-2259 2300-0659 24 Hour Total   INTAKE   I.V.     1000(12.6)  1000(12.6)     Volume (mL) (electrolyte-R (pH 7.4) (NORMOSOL-R pH 7.4) IV solution)     500  500     Volume (mL) (lactated Ringers infusion)     500  500   IV Piggyback    261.2   261.2     Volume (mL) (vancomycin (VANCOCIN) 1,500 mg in sodium chloride 0.9% 250 mL ADDaptor IVPB)    261.2   261.2   Shift Total(mL/kg)    261.2(3.3) 1000(12.6)  1261.2(15.9)   OUTPUT   Urine            Urine Occurrence   1 x       Shift Total(mL/kg)          Weight (kg)    79.4 79.4 79.4 79.4

## 2021-10-19 NOTE — Unmapped (Signed)
Anesthesia Post Note    Patient: Matthew [name]    Procedure(s) Performed: Procedure(s):  C3-C4 ANTERIOR CERVICAL DECOMPRESSION AND FUSION.    Anesthesia type: general    Patient location: PACU    Airway: Patent    Post pain: Adequate analgesia    Nausea / Vomiting: Absent    Post-operative Hydration Status: Adequate    Post assessment: no apparent anesthetic complications and tolerated procedure well    Last Vitals:   Vitals:    10/19/21 1815 10/19/21 1830 10/19/21 1845 10/19/21 1900   BP: 116/65 111/62 116/62 114/65   Pulse: 69 68 69 71   Resp: 11 10 10 11    Temp:       TempSrc:       SpO2: 93% 91% 97% 99%   Weight:       Height:            Post vital signs: stable    Level of consciousness: awake and alert     Complications:  No notable events documented.

## 2021-10-19 NOTE — Unmapped (Signed)
Anesthesia Extubation Criteria:    Airway Device: endotracheal tube    Emergence Details:      Smooth      _x_      Stormy       __       Prolonged   __     Extubation Criteria:      Motor strength intact       _x_      Follows commands        _x_      Good airway reflexes      _x_      OP suctioned                  _x_        Follows commands:  Yes     Patient extubated:  Yes

## 2021-10-19 NOTE — Unmapped (Signed)
INTRA-OP POST BRIEFING NOTE: Matthew [name]      Specimens:     Prior to leaving the room: Nurse confirmed name of procedure, completion of instrument, sponge & needle counts, reads specimen labels aloud including patient name and addresses any equipment issues? Nurse confirmed wound class. Nurse to surgeon and anesthesia: What are key concerns for recovery and management of the patient?  Yes      Blood products stored at appropriate temperatures prior to return to blood bank (if applicable)? Yes      Patient identification band secured on patient prior to transfer out of the operating room? Yes    Temporary devices implanted for the duration of the surgery removed and evaluated for intactness and completeness prior to closure? Yes      Other Comments:     Signed: Preeya Cleckley    Date: 10/19/2021    Time: 2:01 PM

## 2021-10-19 NOTE — Unmapped (Signed)
[facility]               [facility]      PATIENT NAME:      Matthew [name], Matthew [name]        MRN:               [id number]  DATE OF BIRTH:     Oct 06, 1945                    CSN:               [id number]  PHYSICIAN:         Kallie LocksYash Naod Sweetland, MD                ADMIT DATE:        10/19/2021  DICTATED BY:       Kallie LocksYash Saw Mendenhall, MD                SURGERY DATE:      10/19/2021                              OPERATIVE REPORT    SURGEON:  Kallie LocksYash Rashaun Curl, MD      CO-SURGEON:  Dr. Tyrell AntonioJoseph Cheng.    PREOPERATIVE DIAGNOSIS:  Anterior cervical spine disease.    POSTOPERATIVE DIAGNOSIS:  Anterior cervical spine disease.    PROCEDURE PERFORMED:  Approach to anterior cervical spine, right.    COMPLICATIONS:  None.    DETAILS OF PROCEDURE:  On 10/19/2021, Matthew [name] was brought to main OR, placed in supine position.  General anesthesia was administered.  All pressure points were padded.  Neck was prepped and draped in position by the Neurosurgery Service.  I   made an incision on the right side of the neck and picking the trachea and esophagus up off the anterior column exposing the obvious bur without damaging the superior recurrent laryngeal nerve.  I checked the esophagus.  It was intact and away from the   areas that were planned for exposure.  This completed my portion of the procedure.        Kallie LocksYASH Cliffton Spradley, MD      YP/AQ  DD: 10/22/2021 18:13:25  DT: 10/22/2021 20:45:09    JOB#:  [id number]

## 2021-10-19 NOTE — Unmapped (Signed)
I personally examined the patient this morning in same day surgery. No changes to H&P from clinic note. This progress note/consult was copied forward from the note written by anesthesia Dr. Susy Manor. I have reviewed and updated the history, physical exam, data, assessment and plan of the note so it reflects the evaluation and management of the patient on today's date.     76yo M with history of cervical dystonia and worsening balance and dysphagia. MRI demonstrating a large C3-4 disc herniation with anterior osteophytes    Exam:   General: NAD  Neurological:  -GCS: 15  -Orientation: alert, oriented x 3 (person, place, and time)  -Language: speech fluent and appropriate  -Cranial Nerves: PERRL, EOMI, face symmetric, tongue midline, sensation intact to light touch  -Motor: FCCx4, full and symmetric strength and ROM     D EE EF WE WF HI   RUE:    5 5 5 5 5 5     LUE:     5 5 5 5 5 5     HF KE KF DF PF EHL   RLE: 5 5 5 5 5 5    LLE: 5 5 5 5 5 5   -Sensation: sensation intact to light touch and symmetric in bilateral upper and lower extremities          Plan:  OR for C3-4 ACDF with ENT assistance for exposure  Risks and benefits discussed. Patient in agreement to move forward with surgery.  NPO. Marked. Consented.  Dispo: 24h observation vs home post-op     Son Barkan M. Jaydah Stahle, MD   Neurosurgery Resident   12:13 PM 10/19/2021

## 2021-10-19 NOTE — Unmapped (Signed)
[facility]  DEPARTMENT OF ANESTHESIOLOGY  PRE-PROCEDURAL EVALUATION    Matthew [name] is a 76 y.o. year old male presenting for:    Procedure(s):  C3-C4 ANTERIOR CERVICAL DECOMPRESSION AND FUSION.    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       No history of anesthetic complications   I have reviewed the History and Physical Exam, any relevant changes are noted in the anesthesia pre-operative evaluation.      Cardiovascular:    Exercise tolerance: good  Duke Met score: 5 - Walking four miles per hour. Social dancing. Washing a car.  (+) hyperlipidemia (on crestor).    Hypertension (takes amlodipine) is well controlled.      (-) dysrhythmias, angina, CHF.    Neuro/Muscoloskeletal/Psych:    (+) neuromuscular disease (esophageal dysphagia, cervical myelopathy).      (-) seizures, TIA, back problems, no anxiety, no depression.     Pulmonary:          Sleep apnea (does not use CPAP).    (-) no pneumonia, asthma, shortness of breath, recent URI.       GI/Hepatic/Renal:            (-) GERD, liver disease, renal disease.    Endo/Other:          (-) diabetes mellitus, hypothyroidism, no anemia, no DVT, no steroid use.     Comments:   CT coronary calcium scoring 09/03/2021  IMPRESSION:   1. Total calcium score of 483, implying extensive atherosclerotic plaque. There is a high likelihood of at least one significant coronary narrowing.      MRI c-spine 07/14/2021  IMPRESSION:   1. 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 Severe left C4 foraminal stenosis.   2.  Degenerative changes other levels without additional cord compression. Mild to moderate foraminal narrowing as described.    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    Environmental allergies     Hearing loss     left (since age 7), right more recent    High cholesterol     Hypertension     Neck symptom     Trouble turning head to the left     Sleep apnea     Sleep disorder        Past Surgical History     Past Surgical History:   Procedure Laterality Date    ACHILLES TENDON REPAIR  01/31/2002    left     APPENDECTOMY      HERNIA REPAIR  02/01/1951    KNEE SURGERY Left 1976    TONSILLECTOMY      WISDOM TOOTH EXTRACTION         Family History     Family History   Problem Relation Age of Onset    Colon Cancer Mother     Heart failure Father        Social History     Social History     Socioeconomic History    Marital status: Married  Spouse name: Not on file    Number of children: Not on file    Years of education: Not on file    Highest education level: Not on file   Occupational History    Not on file   Tobacco Use    Smoking status: Never    Smokeless tobacco: Never   Vaping Use    Vaping Use: Never used   Substance and Sexual Activity    Alcohol use: Yes     Alcohol/week: 4.0 standard drinks     Types: 2 Glasses of wine, 2 Cans of beer per week    Drug use: No     Comment: 02/16/10    Sexual activity: Not on file   Other Topics Concern    Caffeine Use Yes    Occupational Exposure Not Asked    Exercise Yes    Seat Belt Yes   Social History Narrative    Not on file     Social Determinants of Health     Financial Resource Strain: Not on file   Physical Activity: Not on file   Stress: Not on file   Social Connections: Not on file   Housing Stability: Not on file       Medications     Allergies:  Allergies   Allergen Reactions    Animal Dander      Causes congestion    Adhesive Tape-Silicones Rash       Home Meds:  Prior to Admission medications as of 10/19/21 1224   Medication Sig Taking?   amLODIPine (NORVASC) 5 MG tablet Take 1 tablet (5 mg total) by mouth daily. Yes   azelastine  (ASTELIN) 137 mcg (0.1 %) nasal spray Use 2 sprays into each nostril 2 times a day. Yes   baclofen (LIORESAL) 10 MG tablet Take 1 tablet (10 mg total) by mouth. Yes   brimonidine (ALPHAGAN) 0.2 % ophthalmic solution Place 1 drop into the right eye 2 times a day. Yes   cetirizine (ZYRTEC) 10 MG tablet Take 1 tablet (10 mg total) by mouth daily. Yes   doxepin (SINEQUAN) 10 MG capsule Take 1 capsule (10 mg total) by mouth at bedtime. Yes   finasteride (PROSCAR) 5 mg tablet  Yes   gabapentin (NEURONTIN) 600 MG tablet Take 1 tablet (600 mg total) by mouth 3 times a day.  Patient taking differently: Take 1 tablet (600 mg total) by mouth daily. Yes   omega-3 acid ethyl esters (LOVAZA) 1 gram capsule Take 2 capsules (2 g total) by mouth daily. Yes   rosuvastatin (CRESTOR) 10 MG tablet Take 1 tablet (10 mg total) by mouth every evening. Yes   timolol (TIMOPTIC) 0.5 % ophthalmic solution INSTILL 1 DROP INTO BOTH EYES EVERY 12 HOURS Yes   ALPHA LIPOIC ACID ORAL Take by mouth.    LUMIGAN 0.01 % Drop     tadalafiL (CIALIS) 20 MG tablet Take by mouth.        Inpatient Meds:  Scheduled:   Continuous:     PRN: acetaminophen, ceFAZolin (ANCEF) IVPB, dextrose 10% in water **OR** dextrose 10% in water, gabapentin, glucose, lidocaine (PF) 2% (20 mg/mL), vancomycin    Vital Signs     Wt Readings from Last 3 Encounters:   10/19/21 175 lb (79.4 kg)   10/18/21 175 lb (79.4 kg)   10/14/21 179 lb 4.8 oz (81.3 kg)     Ht Readings from Last 3 Encounters:   10/19/21 5' 11 (1.803 m)  10/18/21 5' 11 (1.803 m)   10/14/21 5' 11 (1.803 m)     Temp Readings from Last 3 Encounters:   10/19/21 97.3 F (36.3 C) (Temporal)   10/18/21 98 F (36.7 C) (Temporal)   10/14/21 98.4 F (36.9 C) (Oral)     BP Readings from Last 3 Encounters:   10/19/21 131/79   10/18/21 148/75   10/14/21 122/77     Pulse Readings from Last 3 Encounters:   10/19/21 61   10/18/21 54   10/14/21 57     @LASTSAO2 (3)@    Physical Exam     Airway:     Mallampati: II  TM distance:  > = 3 FB  Neck ROM: limited  (-) no facial hair, neck not short, not intubated      Dental:   - No obvious cracked, loose, chipped, or missing teeth.     Pulmonary:      Breathing: unlabored       (-) no rhonchi and no rales.    Cardiovascular:     Rhythm: regular  Rate: normal  (-) murmur and peripheral edema.    Neuro/Musculoskeletal/Psych:     Mental status: alert and oriented to person, place and time.          Abdominal:     Not obese.    Current OB Status:       Other Findings:      Laboratory Data     Lab Results   Component Value Date    WBC 6.7 10/14/2021    HGB 13.1 (L) 10/14/2021    HCT 37.6 (L) 10/14/2021    MCV 94.6 10/14/2021    PLT 141 10/14/2021       No results found for: [facility]    Lab Results   Component Value Date    GLUCOSE 84 10/14/2021    BUN 22 10/14/2021    CO2 31 10/14/2021    CREATININE 0.85 10/14/2021    K 3.9 10/14/2021    NA 143 10/14/2021    CL 105 10/14/2021    CALCIUM 9.5 10/14/2021    ALBUMIN 4.2 10/14/2021    PROT 6.8 10/14/2021    ALKPHOS 66 10/14/2021    ALT 24 10/14/2021    AST 24 10/14/2021    BILITOT 0.7 10/14/2021       Lab Results   Component Value Date    INR 1.0 10/14/2021       No results found for: PREGTESTUR, PREGSERUM, HCG, HCGQUANT    Anesthesia Plan     ASA 2         Current non-smoker    Anesthesia Type:  general.      PONV Risk Factors: current non-smoker,  plan for postoperative opioid use.              Induction:   Intravenous induction.      Anesthetic plan and risks discussed with patient.    Plan, alternatives, and risks of anesthesia, including death, have been explained to and discussed with the patient/legal guardian.  By my assessment, the patient/legal guardian understands and agrees.  Scenario presented in detail.  Questions answered.    Use of blood products discussed with patient who consented to blood products.        Plan discussed with CRNA and RNSA.

## 2021-10-19 NOTE — Unmapped (Addendum)
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 [address]  Phone: [phone]     [id number] [place], [place] - [address] AT [facility]  [address]  [place] [address]  Phone: [phone]     [facility]  [address]  [place] [address]  Phone: [phone]      Issues related to obtaining medications: none reported    Payor Information   Medical Insurance Coverage:  Payor: MEDICARE / Plan: MEDICARE A AND B / Product Type: Medicare /   Secondary Payor: AARP    Functional Assessment   Functional Assessment  Assessment Information Obtained From:: Spouse  Current Mental Status: Unable to Assess  Mental Status Prior to Admission: Unable to Assess  Mental Health History: No  Suicide Attempts: No  Activities of Daily Living: Independent  Work History: Retired  Marital Status: Married  Number of children and their names: 2 adult ; Matthew [name] and Matthew [name]  Relative Search Completed: No  Demographics Correct:: Yes  Discharge Destination: Home    Current Living Arrangements   Current Living Arrangements  Current Living Arrangements: Home  Type of Housing: Apartment  Who do you live with?: With Family  What family member?: spouse and 42 yo son  One Story or Two (check all that apply): One Financial planner the number of steps and rails to enter the residence: none, elevator access to apt  History of Falls?: No    Electrical engineer at Home: DME  DME Current: Paediatric nurse (shower  bench)  Was any abuse reported by patient?: No     Has a CPCP but is not currently using it and may need a new sleep study.     Support Systems   Emergency contact: Extended Emergency Contact Information  Primary Emergency Contact: [facility]  Address: [address] [place], [address]           [place], [place] [address] [place]  Home Phone: [phone]  Relation: Spouse  Secondary Emergency Contact: Matthew [name]  Address: [address] [place], [place] [address] [place] Phone: [phone]  Relation: Daughter    Support Systems  Legal Status: HCPOA  Name of Guardian/POA/ Payee and Phone Number: Aman Bonet, spouse [phone]  Primary Caregiver: Self, Spouse  Times of available support: Total 24/7 hands on (add comment)  Marital Status: Married  Number of children and their names: 2 adult ; Matthew [name] and Matthew [name]  Relative Search Completed: No  Demographics Correct:: Yes  Discharge Destination: Home  Next of Kin: Matthew [name]  Next of Kin Relationship: Spouse  Next of Kin Phone Number: [phone]  Assessment Information Obtained From:: Spouse    Other Pertinent Information      Spouse, Jaxx, Huish is legal NOK. She reports that they have  a HCPOA, CM requested copy of document , she advised they are planning to move to [facility] in 2 months so she may not provide a copy.      Advance Directives (For Healthcare)  Advance Directive: Patient does not have advance directive    Discharge Plan     Patient in OR today for C3-4 ACDF.     Spoke with spouse  to initiate discussion regarding discharge planning. Introduced self and role of case management/social work and provided Tour manager.  Patient is independent at baseline, no HHC, has a shower seat , no home Oxygen, is not a veteran, no S/A, S/I or substance abuse issues reported, has a CPAP but is not using currently and may need  a new sleep study per spouse's report.    Spouse and adult son can provide 24 hour S/A at discharge as needed. Patient was driving PTA, and participating in a balance clinic in [facility]. Spouse reports he is very diligent about maintaining his exercise routine.     Anticipated Discharge Plan: Home     Anticipated Discharge Date: tomorrow pending medical clearance, possible PTOT    Anticipated Transportation: spouse or son    Patient/Family aware and taking part in the discharge plan.  Patient/family educated that once post-acute care needs have been identified, a provider list applicable to the identified post-acute care needs as well as the insurance provider will be provided, and patient/family have the freedom to choose their provider(s); financial interest(s) are disclosed as appropriate.    Elliot Dally RN CCM  [phone]

## 2021-10-19 NOTE — Unmapped (Signed)
[facility] OPERATIVE REPORT    CONFIDENTIAL - DO NOT COPY WITHOUT APPROPRIATE AUTHORIZATION    Patient Name: Matthew [name]    MRN: [id number]    Surgery Date: 10/19/2021    Surgeon(s): Denyse Dago, MD    Co-Surgeon(s): Kallie Locks, MD (ENT)    Assistant(s): Danice Goltz, MD    Resident(s): Cordelia Poche, MD    Note: I certify that the services were medically necessary and that no qualified resident was available to perform the services.  Dr. Hyman Hopes assisted in the cervical decompression and fusion of a calcified cervical disc herniation with adhesion to the dura.    PREOPERATIVE DIAGNOSES:  1. Cervical spondylotic myelopathy.  2. 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  3. Dysphagia from C3-C4 anterior osteophytosis.    POSTOPERATIVE DIAGNOSES:  1. Cervical spondylotic myelopathy.  2. 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  3. Dysphagia from C3-C4 anterior osteophytosis.    OPERATION:  1. C3-C4 anterior cervical decompression and fusion with Depuy-Synthes interbody cage and anterior cervical plating system.    Anesthesia:  GETA.    Complications:  None.    OPERATIVE REPORT:  The patient is a 76 y.o. male with a history of HTN and peripheral neuropathy who presents for evaluation of worsening imbalance. 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 He notes cough triggered with swallowing. He has been working with therapy for imbalance. 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 Review of MRI C-spine from 08/17/21 demonstrates severe C3-C4 central stenosis with anteriorly projecting bone spur. He presented with symptoms of cervical spondylotic myelopathy confirmed with exam with severe compression of his spinal cord noted on MRI, associated with a large osteophyte posterior to the vallecula and epiglottis which would be associated with his dystonia.  We discussed surgical intervention with C3-C4 ACDF to address bone spur and cervical stenosis in  the setting of his ataxia and coughing fits after swallowing.  We discussed the surgery needed at length, which I would include ENT on given the dysphagia and anterior osteophyte by the epiglottis.  Work up noted [employer] as a patient with cervical spondylitic myelopathy and dystonia and dysphagia from C3-C4 severe stenosis and spinal cord compression with a large anterior osteophytosis.  Operative and non-operative options were reviewed with operative recommendations consisting of C3-C4 anterior cervical decompression and fusion with Zero-P interbody cage due to its location, with ENT as our co-surgeon due to his dystonia and anterior osteophyte adjacent to the epiglottis.  Details of the surgical procedure were discussed at length, with risks and benefits noted.  Risks include, but are not limited to, bleeding, infection, cerebrospinal fluid leak, failure of the instrumentation and fusion construct, further neurological injury including paralysis, bowel and bladder dysfunction, coma and death.  As well, he was advised that surgery may fail to relieve his symptoms, and could potentially make them worse, and that it is unlikely that surgery would render him pain or neurological symptom free.  He verbalized understanding of the risks and benefits, and wished to proceed with the surgery.    The patient was brought to the operative suite and placed under general anesthesia without difficulty. Prophylactic antibiotics and DVT prophylaxis were initiated.  He was subsequently transferred to the operative table in a supine position with all appropriate pressure points padded using spine precautions. The region of his anterior cervical spine was devoid of hair and subsequently prepped and draped in a sterile fashion. A localizing x-ray was obtained  to confirm alignment of his cervical spine in addition to location of the skin incision placement.    OPENING OF THE WOUND:  A time out procedure was completed.   Please see the operative dictation from Dr. Micheal Likens regarding the prep and exposure of the anterior cervical spine.  Once he finished dissecting the esophagus from the anterior osteophytes and assessing its structure, we were called back into the room and entered the field.  Self- retaining retractors were then subsequently placed, exposing the anterior cervical disc regions.    C3-C4 anterior cervical decompression and fusion with Depuy-Synthes interbody cage and anterior cervical plating system:  A rectangular annulotomy created at the C3-C4 disk space using a #11 blade and removed with a pituitary rongeur for release of the anterior longitudinal ligament and fibrous band, noting that the disc space was calcified with a firm fibrosis. Use of the Stryker drill with the cutting bur in addition to the straight and forward-angle bone curettes created a rectangular trough to complete the diskectomy using the Smith-Robinson technique. The operating microscope was then prepared and draped, and brought into the surgical field as per hospital protocol.  Using microsurgical techniques, the calcified disc and posterior osteophytes were noted to be quite adherent to the anterior dura and carefully resected using a combination of 1-mm and 2-mm Kerrison rongeurs. The region copiously irrigated and a small pledget of Gelfoam placed for hemostasis.  A series of trialers were then used with the decision made to place an 8-mm Depuy-Synthes cage at the C3-C4 level. The cage was then prepared as per manufacturer's recommendations and packed with DBM morsellized bone allograft, with subsequently placing it into the interbody space and countersinking by 1 mm to 2 mm after it had been appropriately sized and trialed. The plates were attached to the cage and a hand drill to create channels through the plate into the C3 and C4 vertebral bodies. Screws were then used to secure the plate with a 16-XW variable angled screws into the C4 and an  18-mm screw into the C3 level. Tightening of all screws was then done and the wound was then irrigated. A localizing x-ray was then obtained and noted good hardware position.    CLOSURE OF THE WOUND:  Valsalva was performed without evidence of CSF or air leaks.  The operating microscope was then removed from the field.  A JP drain was then tunneled out through the inferior portion of the wound and cut to length. This was then connected to a grenade bulb suction. The platysma layer was then reapproximated with 2-0 Vicryls followed by 3-0 Vicryls for the dermal region. Skin closure was then completed and then sterile dressings were then applied. The patient was then taken off the operative table and placed back onto a hospital bed using cervical spine precautions without difficulty.    All needle and sponge counts were correct and there were no complications. The patient tolerated the procedure well and there were no adverse reactions as reported by the anesthesiologist.  Attending attestation statement of Dr. Tyrell Antonio:  I was scrubbed and present through all critical portions of this procedure.  Additional coverage was provided by Dr. Astrid Divine.    ______________________, M.D.  Denyse Dago, M.D.

## 2021-10-20 ENCOUNTER — Observation Stay: Admit: 2021-10-20 | Payer: MEDICARE

## 2021-10-20 LAB — CBC
Hematocrit: 36.4 % — ABNORMAL LOW (ref 38.5–50.0)
Hemoglobin: 12.8 g/dL — ABNORMAL LOW (ref 13.2–17.1)
MCH: 32.9 pg (ref 27.0–33.0)
MCHC: 35.1 g/dL (ref 32.0–36.0)
MCV: 93.6 fL (ref 80.0–100.0)
MPV: 8.8 fL (ref 7.5–11.5)
Platelets: 136 10*3/uL — ABNORMAL LOW (ref 140–400)
RBC: 3.89 10*6/uL — ABNORMAL LOW (ref 4.20–5.80)
RDW: 13.1 % (ref 11.0–15.0)
WBC: 11.4 10*3/uL — ABNORMAL HIGH (ref 3.8–10.8)

## 2021-10-20 LAB — RENAL FUNCTION PANEL W/EGFR
Albumin: 3.8 g/dL (ref 3.5–5.7)
Anion Gap: 10 mmol/L (ref 3–16)
BUN: 22 mg/dL (ref 7–25)
CO2: 27 mmol/L (ref 21–33)
Calcium: 9.1 mg/dL (ref 8.6–10.3)
Chloride: 105 mmol/L (ref 98–110)
Creatinine: 0.86 mg/dL (ref 0.60–1.30)
EGFR: >90
Glucose: 120 mg/dL — ABNORMAL HIGH (ref 70–100)
Osmolality, Calculated: 299 mosm/kg (ref 278–305)
Phosphorus: 2.7 mg/dL (ref 2.1–4.5)
Potassium: 4 mmol/L (ref 3.5–5.3)
Sodium: 142 mmol/L (ref 133–146)

## 2021-10-20 MED ORDER — oxyCODONE (ROXICODONE) immediate release tablet 5 mg
5 | ORAL | PRN
Start: 2021-10-20 — End: 2021-10-20
  Administered 2021-10-20: 20:00:00 5 mg via ORAL

## 2021-10-20 MED ORDER — acetaminophen (TYLENOL) 325 MG tablet
325 | ORAL_TABLET | Freq: Four times a day (QID) | ORAL | 0 refills | 11.00000 days | Status: AC | PRN
Start: 2021-10-20 — End: 2021-10-21

## 2021-10-20 MED ORDER — oxyCODONE (ROXICODONE) 5 MG immediate release tablet
5 | ORAL_TABLET | ORAL | 0 refills | 6.00000 days | PRN
Start: 2021-10-20 — End: 2021-10-21

## 2021-10-20 MED ORDER — naloxone (NARCAN) 4 mg/actuation Spry
4 | NASAL | 1 refills | PRN
Start: 2021-10-20 — End: 2021-10-21

## 2021-10-20 MED ORDER — senna-docusate (SENNA-S) 8.6-50 mg per tablet
8.6-50 | ORAL_TABLET | Freq: Two times a day (BID) | ORAL | 0 refills | 30.00000 days
Start: 2021-10-20 — End: 2021-10-21

## 2021-10-20 MED ORDER — acetaminophen (TYLENOL) tablet 975 mg
325 | Freq: Four times a day (QID) | ORAL | PRN
Start: 2021-10-20 — End: 2021-10-20

## 2021-10-20 MED FILL — DEXAMETHASONE SODIUM PHOSPHATE 10 MG/ML INJECTION SOLUTION: 10 10 mg/mL | INTRAMUSCULAR | Qty: 1

## 2021-10-20 MED FILL — CETIRIZINE 10 MG TABLET: 10 10 MG | ORAL | Qty: 1

## 2021-10-20 MED FILL — AMLODIPINE 5 MG TABLET: 5 5 MG | ORAL | Qty: 1

## 2021-10-20 MED FILL — STIMULANT LAXATIVE PLUS 8.6 MG-50 MG TABLET: 8.6-50 8.6-50 mg | ORAL | Qty: 1

## 2021-10-20 MED FILL — DOXEPIN 10 MG CAPSULE: 10 10 MG | ORAL | Qty: 1

## 2021-10-20 MED FILL — ATORVASTATIN 20 MG TABLET: 20 20 MG | ORAL | Qty: 2

## 2021-10-20 MED FILL — GABAPENTIN 300 MG CAPSULE: 300 300 MG | ORAL | Qty: 2

## 2021-10-20 MED FILL — BRIMONIDINE 0.2 % EYE DROPS: 0.2 0.2 % | OPHTHALMIC | Qty: 5

## 2021-10-20 MED FILL — FINASTERIDE 5 MG TABLET: 5 5 mg | ORAL | Qty: 1

## 2021-10-20 MED FILL — OXYCODONE 5 MG TABLET: 5 5 MG | ORAL | Qty: 1

## 2021-10-20 NOTE — Unmapped (Addendum)
[facility]  HEAD AND NECK - ENT PROGRESS NOTE    Date of Service: 10/20/2021  Date of Surgery: 10/19/2021      ASSESSMENT: Matthew [name] is a 76 y.o. male  who is s/p C3-4 ACDF with neurosurgery on whom ENT assisted with approach.      PLAN/RECOMMENDATIONS:   - admitted, nsgy service  - JP drain management per NSGY  - please page/call ENT with any questions or concerns    SUBJECTIVE:   No acute events overnight. Pain well controlled.    OBJECTIVE:  Physical Exam:   Temp (24hrs), Avg:97.5 F (36.4 C), Min:97.3 F (36.3 C), Max:97.7 F (36.5 C)    Vitals:    10/19/21 1930 10/19/21 1945 10/19/21 2031 10/19/21 2159   BP: 115/72 121/70 117/71    BP Location: Right upper arm  Right upper arm    Patient Position: Lying  Sitting    BP Cuff Size: Regular  Regular    Pulse: 72 79 71 81   Resp: 10 15 16     Temp: 97.7 F (36.5 C)  97.6 F (36.4 C)    TempSrc: Axillary  Oral    SpO2: 97% 99% 98% 99%   Weight:       Height:         Date 10/19/21 0700 - 10/20/21 0659 10/20/21 0700 - 10/21/21 0659   Shift 0700-1459 1500-2259 2300-0659 24 Hour Total 0700-1459 1500-2259 2300-0659 24 Hour Total   INTAKE   I.V.(mL/kg)  1000(12.6)  1000(12.6)         Volume (mL) (electrolyte-R (pH 7.4) (NORMOSOL-R pH 7.4) IV solution)  500  500         Volume (mL) (lactated Ringers infusion)  500  500       IV Piggyback 261.2   261.2         Volume (mL) (vancomycin (VANCOCIN) 1,500 mg in sodium chloride 0.9% 250 mL ADDaptor IVPB) 261.2   261.2       Shift Total(mL/kg) 261.2(3.3) 1000(12.6)  1261.2(15.9)       OUTPUT   Urine(mL/kg/hr)  950(1.5)  950         Urine  950  950       Drains   60 60         Output (mL) (Drain 1 Neck Right;Anterior)   60 60       Shift Total(mL/kg)  950(12) 60(0.8) 1010(12.7)       Weight (kg) 79.4 79.4 79.4 79.4 79.4 79.4 79.4 79.4       General: in no apparent distress  Neck:   -No crepitus  -flat, no hematoma  - aquacell dressing in place over incision  - JP drain in place,  holding suction, SS drainage   Chest: no increased work of breathing    Lab Studies:  Lab Results   Component Value Date    WBC 11.4 (H) 10/20/2021    HGB 12.8 (L) 10/20/2021    HCT 36.4 (L) 10/20/2021    MCV 93.6 10/20/2021    PLT 136 (L) 10/20/2021     Lab Results   Component Value Date    GLUCOSE 124 (H) 10/19/2021    BUN 20 10/19/2021    CREATININE 0.97 10/19/2021    K 3.8 10/19/2021    NA 144 10/19/2021    CL 105 10/19/2021    CALCIUM 9.6 10/19/2021     Lab Results   Component Value Date    MG  2.2 10/19/2021     Lab Results   Component Value Date    PHOS 2.1 10/19/2021     Lab Results   Component Value Date    ALKPHOS 73 10/19/2021    ALT 23 10/19/2021    AST 23 10/19/2021    BILITOT 0.7 10/19/2021    ALBUMIN 4.3 10/19/2021    PROT 7.1 10/19/2021

## 2021-10-20 NOTE — Unmapped (Signed)
Best Possible Admission Medication History/Medication Reconciliation  Department of Pharmacy Services      Patient Name: Matthew [name] 04-29-1945    Medication history has been completed by: Pharmacy Technician    Med History Status: Complete    Source(s) of information:  Patient/Retail rx/Pharmacy/Family member     [facility] Pharm[facility]:   [id number] [place] [place] - [address]  [place] [id number]  Phone: [phone]         Home Medications:   Prior to Admission medications taking for visit date 10/19/21   Medication Sig Taking? Authorizing Provider   ALPHA LIPOIC ACID ORAL Take 1 capsule by mouth daily. Yes Historical Provider, MD   amLODIPine (NORVASC) 5 MG tablet Take 1 tablet (5 mg total) by mouth daily. Yes Historical Provider, MD   azelastine (ASTELIN) 137 mcg (0.1 %) nasal spray Use 2 sprays into each nostril 2 times a day. Yes Lorretta HarpKatie Phillips, MD   baclofen (LIORESAL) 10 MG tablet Take 1 tablet (10 mg total) by mouth daily as needed (Muscle spasms).    Verified with pharmacy, [facility] 08/29/2019, #90 for 90 day supply   Yes Historical Provider, MD   brimonidine (ALPHAGAN) 0.2 % ophthalmic solution Place 1 drop into the right eye 2 times a day. Yes Historical Provider, MD   cetirizine (ZYRTEC) 10 MG tablet Take 1 tablet (10 mg total) by mouth daily. Yes Lorretta HarpKatie Phillips, MD   doxepin (SINEQUAN) 10 MG capsule Take 1 capsule (10 mg total) by mouth at bedtime. Yes Historical Provider, MD   finasteride (PROSCAR) 5 mg tablet Take 0.5 mg by mouth daily. Yes Historical Provider, MD   gabapentin (NEURONTIN) 600 MG tablet Take 1 tablet (600 mg total) by mouth 3 times a day. Yes Loura HaltAlberto Espay, MD   LUMIGAN 0.01 % Drop Place 1 drop into both eyes daily. Yes Historical Provider, MD   omega-3 fatty acids-fish oil 300-1,000 mg capsule Take 1 capsule by mouth daily. Yes Historical Provider, MD   rosuvastatin (CRESTOR) 10 MG tablet Take 1 tablet (10 mg total) by mouth every evening. Yes Historical  Provider, MD   tadalafiL (CIALIS) 20 MG tablet Take 1 tablet (20 mg total) by mouth daily as needed for Erectile Dysfunction.    Verified with pharmacy, [facility] 08/24/2021, #10 for 90 day supply   Yes Historical Provider, MD   timolol (TIMOPTIC) 0.5 % ophthalmic solution Place 1 drop into both eyes every 12 hours. Yes Historical Provider, MD   omega-3 acid ethyl esters (LOVAZA) 1 gram capsule Take 2 capsules (2 g total) by mouth daily.  Historical Provider, MD       Clinically relevant discrepancies found in dose, route, or frequency:  Alpha lipoic acid,Cialis- added dose and frequency   Baclofen- added frequency   Finasteride, Lumigan, Timolol- added dose, route and frequency   Omega 3 fish oil- added medication to the pta med list    Medications flagged for removal and reason (patient reported) for removal:  Lovaza- pt does not take this medication    Other notes: [facility] [place] [facility] who verified fill information for baclofen which is listed above and [facility] and spoke with Earney HamburgJean Rph who provided last fill information for cialis. Pt also does not take the atorvastatin that shows a recent fill history in retail rx as the patients PCP switched him to rosuvastatin.     Additions, deletions, and modifications have been made to the prior to  admission medication list in the Review PTA Meds tab of the Admission Navigator, based on the information listed above. To my knowledge the above is the most accurate medication list as of 10/20/2021 9:10 AM. Please contact via Epic Chat with questions and/or concerns.     Merry Proud, Cpht  Medication Reconciliation Technician  Phone: [phone]  10/20/21   9:22 AM

## 2021-10-20 NOTE — Unmapped (Signed)
Problem: Discharge Planning  Goal: Identify discharge needs  Outcome: Progressing     Problem: Acute Pain  Description: Patient's pain progressing toward patient's stated pain goal  Goal: Patient displays improved well-being such as baseline levels for pulse, BP, respirations and relaxed muscle tone or body posture  Outcome: Progressing  Goal: Patient will manage pain with the appropriate technique/intervention  Description: Assess and monitor patient's pain using appropriate pain scale. Collaborate with interdisciplinary team and initiate plan and interventions as ordered.  Re-assess patient's pain level 30-60 minutes after pain management intervention.  Outcome: Progressing  Goal: Patient will reduce or eliminate use of analgesics  Outcome: Progressing  Goal: Patients pain is managed to allow active participation in daily activities  Outcome: Progressing  Goal: Patient verbalizes a reduction in pain level  Outcome: Progressing  Goal: Discharge Pain Management Plan (Acute Pain)  Outcome: Progressing

## 2021-10-20 NOTE — Unmapped (Signed)
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    PCP:  Demetrius Charity, MD    [facility]:    [facility] [address] [place] - [address]  [address] [facility]  [place] [address]  Phone: [phone]     [facility]  [address]  [place] [address]  Phone: [phone]         Medical Insurance Coverage:  Payor: MEDICARE / Plan: MEDICARE A AND B / Product Type: Medicare /     Other Pertinent Information     RNCM participated in interdisciplinary rounds with medical team and completed a chart review. Patient is not medically ready for discharge at this time. May be MR for discharge later today pending drain removal.    Discharge Plan     Anticipated discharge plan:  Home     Anticipated discharge date:  10/20/21     CM/SW will continue to follow and remain available for discharge planning needs.        Princess Perna BSN, RN  Clinical Case Manager - Float  [phone]

## 2021-10-20 NOTE — Unmapped (Signed)
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]  Date: 10/20/2021  Room: 1610/R6045  Reviewed Pertinent hospital course: Yes    Hospital Course PT/OT: Pt is a 76 y/o M s/p C3-4 ACDF 9/19 in the setting of C3-4 disc herniation with anterior osteophytes  Relevant PMH : Cervical dystonia, dysphagia, HTN, HLD, hearing loss  Precautions: none  Activity Level: Activity as tolerated    Assist: Co-evaluation performed    Assessment  Assessment: No impairments  Co-treatment performed: to integrate multiple skills simultaneously in order to challenge patient and advance progress  Prognosis: Excellent  Matthew [name] was pleasant and agreeable  to participate in therapy session this date, tolerating session well. Patient was independent with all bed mobility and STS from the bed. He tolerated ambulating 200' with SPV and without the need for an AD. He feels like he has met his functional mobility goals and did not express having any concerns about returning home at this time. He was encouraged to reach back out to therapy if he has lingering questions. Pt has no existing deficits and will be discharged from skilled inpatient PT services d/t patient meeting his functional mobility goals in order to be safe to return home.       Recommendation  Recommendation: Anticipate no further PT needed after discharge  Equipment Recommended: None         Outcome Measures  AM-PAC 6 Clicks Basic Mobility Inpatient Short Form: PT 6 Clicks Score: 24          Mobility Recommendations for Staff  Patient ability: Patient transfers to chair/ bedside commode, Patient ambulates in hallway, Patient ambulates in room/ to bathroom  Assist needed: with supervision    Home Living/Prior Function  Patient able to provide accurate information at this time: Yes  Lives With: Spouse  Assistance  available: 24 hour assistance  Type of Home: Apartment  Home Entry: No steps to enter;Elevator  Home Layout: One level  Bathroom Shower/Tub: Pension scheme manager: Midwife: Buyer, retail: Agricultural consultant  Prior Function  Functional Mobility: Independent ( no assistive device)  Receives Help From: None needed prior to admission  ADL Assistance: Independent  IADL Assistance: Independent  Vocation: Retired  Leisure Activities: tennis, working out     Pain  Pain Score:  (Patient did not numerically rate pain)  Pain Location: Neck  Therapist reported pain to: Charity fundraiser    Vision  Vision/Perception  Overall Vision/ Perception: Within Conservator, museum/gallery  Overall Cognitive Status: Within Functional Limits  Cognitive Assessment: Arousal/ Alertness;Orientation Level;Behavior;Following Commands;Safety Judgment;Insight  Arousal/Alertness: Alert  Orientation Level: Oriented X4  Behavior: Appropriate;Cooperative  Following Commands: Follows all commands and directions without difficulty  Safety Judgment: Good awareness of safety precautions  Insight: Demonstrated intact insight into limitation and abilities to complete ADL's safely    Neuromuscular  Overall Sensation: Patient denies any numbness/ tingling in BUE's/ BLEs          Upper Extremity  UE Assessment: Defer to OT evaluation for formal assessment    Lower Extremity  Lower Extremity  LE Assessment: Strength WFL (at least 3+/5) as observed during functional activity;ROM grossly [facility]          Functional Mobility  Bed Mobility   Supine to Sit: Independent  Transfers  Sit to Stand: Independent  Gait  Distance (in feet): 200  Level of assistance: Supervision  Assistive Device: None  Gait Characteristics: Steady;No LOB  Unable to progress further due to: pt independent with gait  Balance  Sitting - Static: Independent  Sitting-Dynamic: Independent   Standing-Static: Independent  Standing-Dynamic: Supervision       Gait belt  used: Yes    Position after Therapy/Safety Handoff  Position after treatment and safety handoff  Position after therapy session: Chair  Details: RN notified;Call light/ needs within reach  Alarms: Chair  Alarms Status: Activated and Interfaced with call system    Goals  Collaborated with: Patient  Patient Stated Goal: to go home  Goals to be met by: 10/27/21     Patient/Family Education  Educated patient and patient's family on the role of physical therapy, goals, importance of increased activity, and discharge recommendations and fall prevention strategies, including need for supervision/ assistance with OOB activity and use of call light; patient and patient's family verbalized understanding. Handout(s) issued: none.    Plan  Plan  PT Frequency: One time visit--discharge from PT    The plan of care and recommendations assesses the patient's and/or caregiver's readiness, willingness, and ability to provide or support functional mobility and ADL tasks as needed upon discharge.        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

## 2021-10-20 NOTE — Unmapped (Signed)
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 Pt demos functioning close to baseline and completes all functional transfers, functional mobility, and ADLs I to supvn. Pt reports no concerns for returning home and has all needed DME. No acute OT needs identified, will discharge services. Please re-consult should a need arise.       Outcome Measures  AM-PAC 6 Clicks Daily Activity Inpatient Short Form: OT 6 Clicks Score: 22    Home Living/Prior Function  Patient able to provide accurate information at this time: Yes  Lives With: Spouse  Assistance available: 24 hour assistance  Type of Home: Apartment  Home Entry: No steps to enter, Elevator  Home Layout: One level  Bathroom Shower/Tub: Pension scheme managerWalk-in shower  Bathroom Toilet: Midwifetandard  Bathroom Equipment: Buyer, retailhower chair  Home Equipment: Agricultural consultantolling Walker    Prior Function  Functional Mobility: Independent ( no assistive device)  Receives Help From: None needed prior to admission  ADL Assistance: Independent  IADL Assistance: Independent  Vocation: Retired  Leisure Activities: tennis, working out     Pain  Pain Score:  (Patient did not numerically rate pain)  Pain Location: Neck  Therapist  reported pain to: Airline pilotN    Cognition  Overall Cognitive Status: Within Functional Limits  Cognitive Assessment: Arousal/ Alertness;Orientation Level;Behavior;Following Commands;Safety Judgment;Insight  Arousal/Alertness: Alert  Orientation Level: Oriented X4  Behavior: Appropriate;Cooperative  Following Commands: Follows all commands and directions without difficulty  Safety Judgment: Good awareness of safety precautions  Insight: Demonstrated intact insight into limitation and abilities to complete ADL's safely    Vision  Overall Vision/ Perception: Within Functional Limits       Right Upper Extremity   Right UE ROM: Grossly WFL as observed during functional activities  Right UE Strength: Grossly WFL (at least 3+/5) as observed during functional activities  Right UE Muscle Tone: Normal  Right Hand Function: Grossly WFL as observed during functional activity         Left Upper Extremity  Left UE ROM: Grossly WFL as observed during functional activities  Left UE Muscle Tone: Normal  Left UE Hand Function: Grossly WFL as observed during functional activites         Neuromuscular  Overall Sensation: Patient denies any numbness/ tingling in BUE's/ BLEs          Functional Mobility  Bed Mobility   Supine to Sit: Modified independent;head of bed elevated  Transfers  Sit to Stand: Independent  Stand to Sit: Independent  Functional Mobility: Supervision  Balance  Sitting - Static: Independent  Sitting-Dynamic: Independent   Standing-Static: Independent  Standing-Dynamic:  Supervision    Gait belt used: Yes    ADL  Lower Body Dressing: Independent  Lower Body Dressing Deficit: Don/doff R sock;Don/doff L sock  Location Assessed LE Dressing: Seated edge of bed  Unable to progress further due to: declines further         Position after Treatment/Safety Handoff  Position after therapy session: Chair  Details: RN notified;Call light/ needs within reach;visitor present  Alarms: Chair  Alarms Status: Activated and Interfaced with call  system    Plan  Plan  Progress: Discontinue OT  OT Frequency: One-time visit--discharge from OT    The plan of care and recommendations assesses the patient's and/or caregiver's readiness, willingness, and ability to provide or support functional mobility and ADL tasks as needed upon discharge.      Assessment/Goals/Plan  Pt with no skilled acute occupational therapy needs, therefore no occupational therapy goals were established. Discharge patient from inpatient occupational therapy.       Pt stated goal to go home.          Patient/Family Education  Educated patient on the role of occupational therapy, OT goals, OT plan of care, discharge recommendation, ADL training, functional mobility training, no bending, lifting or twisting, and the importance of safety and fall prevention strategies including need for supervision/ assistance with OOB activity and use of call light. patient and patient's family  verbalized understanding.    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

## 2021-10-20 NOTE — Unmapped (Signed)
[facility]  Inpatient Discharge Summary     Patient: Matthew [name]  Age: 76 y.o.    MRN: [id number]   CSN: [id number]    Date of Admission: 10/19/2021  Date of Discharge: 10/20/2021  Attending Physician: No att. 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      Operations/Procedures Performed (include dates)     Surgeries:  Surgical/Procedural Cases on this Admission       Case IDs Date Procedure Surgeon Location Status    [id number] 10/19/21 C3-C4 ANTERIOR CERVICAL DECOMPRESSION AND FUSION. Denyse Dago, MD [facility] OR [facility]            Lines and tubes:  Patient Lines/Drains/Airways Status       Active Line / PIV Line       None                    Other Procedures / Pertinent Imaging:    X-ray Cervical Spine 2 or 3-views   Final Result   IMPRESSION:      1.  Interbody disc spacer at C3-4 without hardware complication.   2.  Mild lower cervical degenerative disc disease.      Approved by Carmelia Bake, DO on 10/20/2021 1:36 PM EDT      I have personally reviewed the images and I agree with this report.      Report Verified by: Shelba Flake, MD at 10/20/2021 1:57 PM EDT            Consulting Services (include reason)         Allergies     Allergies   Allergen Reactions    Animal Dander      Causes congestion    Adhesive Tape-Silicones Rash       Discharge Medications        Medication List        TAKE these medications, which are NEW        Quantity/Refills   acetaminophen 325 MG tablet  Commonly known as: TYLENOL  Take 3 tablets (975 mg total) by mouth every 6 hours as needed (Give 1  hour pre-op).   Quantity: 100 tablet  Refills: 0     naloxone 4 mg/actuation Spry  Commonly known as: NARCAN  Apply 1 spray in one nostril if needed. Call 911. May repeat dose in other nostril if no response in 3 minutes.   Quantity: 2 each  Refills: 1     oxyCODONE 5 MG immediate release tablet  Commonly known as: ROXICODONE  Take 1 tablet (5 mg total) by mouth every 4 hours as needed for up to 20 doses.   Quantity: 20 tablet  Refills: 0     senna-docusate 8.6-50 mg per tablet  Commonly known as: SENNA-S  Take 1 tablet  by mouth 2 times a day.   Quantity: 30 tablet  Refills: 0            TAKE these medications, which you were ALREADY TAKING        Quantity/Refills   ALPHA LIPOIC ACID ORAL  Take 1 capsule by mouth daily.   Refills: 0     amLODIPine 5 MG tablet  Commonly known as: NORVASC  Take 1 tablet (5 mg total) by mouth daily.   Refills: 0     azelastine 137 mcg (0.1 %) nasal spray  Commonly known as: ASTELIN  Use 2 sprays into each nostril 2 times a day.   Quantity: 30 mL  Refills: 6     baclofen 10 MG tablet  Commonly known as: LIORESAL  Take 1 tablet (10 mg total) by mouth daily as needed (Muscle spasms).   Refills: 0     bimatoprost 0.01 % Drop  Commonly known as: LUMIGAN  Place 1 drop into both eyes daily.   Refills: 0     brimonidine 0.2 % ophthalmic solution  Commonly known as: ALPHAGAN  Place 1 drop into the right eye 2 times a day.   Refills: 0     cetirizine 10 MG tablet  Commonly known as: ZYRTEC  Take 1 tablet (10 mg total) by mouth daily.   Quantity: 30 tablet  Refills: 11     doxepin 10 MG capsule  Commonly known as: SINEQUAN  Take 1 capsule (10 mg total) by mouth at bedtime.   Refills: 0     finasteride 5 mg tablet  Commonly known as: PROSCAR  Take 0.5 mg by mouth daily.   Refills: 0     gabapentin 600 MG tablet  Commonly known as: NEURONTIN  Take 1 tablet (600 mg total) by mouth 3 times a day.   Quantity: 270 tablet  Refills: 1     rosuvastatin 10 MG tablet  Commonly known as: CRESTOR  Take 1  tablet (10 mg total) by mouth every evening.   Quantity: 30 tablet  Refills: 5     tadalafiL 20 MG tablet  Commonly known as: CIALIS  Take 1 tablet (20 mg total) by mouth daily as needed for Erectile Dysfunction.   Refills: 0     timolol 0.5 % ophthalmic solution  Commonly known as: TIMOPTIC  Place 1 drop into both eyes every 12 hours.   Refills: 0            STOP taking these medications      omega-3 fatty acids-fish oil 300-1,000 mg capsule               Where to Get Your Medications        These medications were sent to [facility]  [address] [place], [place] [address]      Hours: Sunday - Saturday: 8:00AM - 6:00PM Phone: [phone]   acetaminophen 325 MG tablet  naloxone 4 mg/actuation Spry  oxyCODONE 5 MG immediate release tablet  senna-docusate 8.6-50 mg per tablet             Discharge Exam     Physical Exam  General: NAD  Wound: C/D/I     Neurological  -GCS: 4-5-6  -Orientation: alert, oriented x 3 (person, place, and time)  -Language: speech fluent and appropriate  -Cranial Nerves: PERRL, EOMI, face symmetric, hearing intact, tongue midline   -Motor: FCCx4, full and symmetric strength and ROM  D            EF          EE          HG         IO                 RUE:      5             5             5             5             5                  LUE:      5             5             5             5             5                                 HF          KE          DF          EHL       PF                 RLE:      5             5             5             5             5                  LLE:       5             5             5             5             5   -Sensation: sensation intact to light touch and symmetric in bilateral upper and lower extremities  -Reflex: no hyperreflexia or clonus appreciated, Hoffman's absent BL  -Tone: No abnormality of tone appreciated in bilateral upper or lower extremities.  -Coordination: No bilateral upper extremity  drift; no dysmetria on finger-to-nose or finger-nose-finger assessments.     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      Patient tolerated the procedure well and was post-operatively admitted to the Neurosurgical ward. Pain was well controlled on PO medications. Diet was progressively advanced. Patient was mobilized on POD 0 without difficulty. Patient reported improvement in pre-operative symptoms. Discharged home in stable condition on POD 1.       Condition on Discharge     1. Functional Status: normal   Describe limitations, if any: none  2. Mental Status: Alert/Oriented   Describe limitations, if any: none    3. Dietary Restrictions / Tube Feeding / TPN  Diet/Nutrition Orders    Diet regular     Frequency: Effective Now     Number of Occurrences: Until Specified     Order Questions:      Suicide/Behavior Risk Modification? No     Regular Diet    4. Discharge specific orders:   WOUND CARE: leave sutures/staples/steri-strips in place  keep clean and dry    5. Core measures followed: (if this is a core measure patient)  Discharge Weight: 175 lb (79.4 kg)          Disposition     Home independent      Follow-Up Appointments     Future Appointments   Date Time Provider Department Center   11/03/2021  9:30 AM [facility]   01/17/2022  2:00 PM Loura HaltAlberto Espay, MD [facility]     No follow-up provider specified.    Signed:    Alphonsus SiasABHIJITH Linnae Rasool, MD  10/20/2021, 8:48 PM

## 2021-10-20 NOTE — Unmapped (Signed)
[facility]  DEPARTMENT OF NEUROSURGERY  INPATIENT PROGRESS NOTE  Matthew [name]  [id number]  [birth year]    HD: 2 POD: 1    Neurosurgery Attending: Dr. Sena Hitch, MD    Interval History:  - no acute events overnight  - 7 flat JP drain 60cc output    Meds:  Current Facility-Administered Medications   Medication Dose Frequency Provider Last Admin    amLODIPine  5 mg Daily 0900 Demone Lyles, MD      atorvastatin  40 mg Nightly (2100) Franciszek Platten, MD 40 mg at 10/19/21 2147    bisacodyL  10 mg Daily PRN Mashawn Brazil, MD      brimonidine  1 drop BID Daneka Lantigua, MD 1 drop at 10/19/21 2254    cetirizine  10 mg Daily 0900 Taci Sterling, MD      doxepin  10 mg Nightly (2100) Tyquon Near, MD 10 mg at 10/19/21 2147    finasteride  5 mg Daily 0900 Kyaira Trantham, MD      gabapentin  600 mg TID Algis Liming Zhaniya Swallows, MD 600 mg at 10/19/21 2147    ondansetron  4 mg Q6H PRN Vangie Henthorn, MD      senna-docusate  1 tablet BID Cordelia Poche, MD 1 tablet at 10/19/21 2147       Examination:  Temp:  [97.3 F (36.3 C)-97.7 F (36.5 C)] 97.6 F (36.4 C)  Heart Rate:  [61-81] 75  Resp:  [8-18] 16  BP: (111-133)/(51-79) 127/78  FiO2:  [38 %-96 %] 96 %    General: NAD  Wound: C/D/I    Neurological  -GCS: 4-5-6  -Orientation: alert, oriented x 3 (person, place, and time)  -Language: speech fluent and appropriate  -Cranial Nerves: PERRL, EOMI, face symmetric, hearing intact, tongue midline   -Motor: FCCx4, full and symmetric strength and ROM    D EF EE HG IO   RUE:    5 5 5 5 5    LUE:     5 5 5 5 5     HF KE DF EHL PF   RLE: 5 5 5 5 5    LLE: 5 5 5 5 5   -Sensation: sensation intact to light touch and symmetric in bilateral upper and lower extremities  -Reflex: no hyperreflexia or clonus appreciated, Hoffman's absent BL  -Tone: No abnormality of tone appreciated in bilateral upper or lower extremities.  -Coordination: No bilateral upper extremity drift; no dysmetria on  finger-to-nose or finger-nose-finger assessments.     Labs:  Na/K/Cl/CO2:  142/4.0/105/27 (09/20 5409)  WBC/Hgb/Hct/Plts:  11.4/12.8/36.4/136 (09/20 8119)       Imaging Review  No interval imaging.  - post-op uprights ordered, pending    ASSESMENT & PLAN  76 y.o. 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    PLAN:  - uprights pending  - monitor drain output, likely remove today  - continue home meds  - pain control: gabapentin 600 TID, doxepin 10 QHS  - bowel reg: senna  - diet regular  - PT/OT  - DVT prophylaxis: SCDs, okay for SQH 24 hours after surgery  - Dispo: pending drains and recovery, likely home today    If further questions or concerns should arise, do not hesitate to contact the neurosurgery resident on call, [phone].    Matthew Butterfield M. Marcianne Ozbun, MD  Neurosurgery Resident [phone])  7:43 AM 10/20/2021

## 2021-10-20 NOTE — Unmapped (Signed)
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 OR 9/19: C3-C4 anterior cervical decompression and fusion with neurosurgery and ENT.  Additional relevant PMH: allergies, hearing loss, HTN, HLD, sleep apnea. No recent chest imaging available. No SLP Hx at [facility].  Assessment complete?: Yes      Assessment: Patient presents with increased risk of oropharyngeal dysphagia secondary to C3-C4 ACDF.  Patient reports that prior to surgery, he had intermittent dysphagia to both solids more so than liquids, which he felt made him cough and were occasionally hard to go down. Pt states that he avoided certain foods that were scratchy or dry, as this made his symptoms worse.  He denies frequent choking or sensation of foods/liquids going down the wrong pipe.  Pt does report a chronic cough prior to surgery, although he did not feel this was specifically related to swallowing.  During assessment, cranial nerve exam and laryngeal function exam was judged to be within functional limits, although pt does demonstrate intermittent wet vocal quality, which improves when he spontaneously clears his throat.  Pt demonstrates no obvious clinical signs of aspiration with ice chips, thin liquids, puree, or hard solids. He exhibits multiple swallows per bolus with thin liquid and hard solids, but vocal quality remains clear/unchanged and pt denies globus sensation during oral trials.  Based on this assessment, recommend Regular diet as tolerated (pt to self-select softer food items as needed) with  Thin liquids, using safe swallow precautions, including: upright positioning, small sips/bites, alternate solids/liquids, eat/drink slowly, remain upright 30-60 minutes after a meal.       Pt seen immediately following the swallow evaluation for education of results and recommendations.  Education provided to the patient and his wife re: anatomy / physiology involved in swallowing, increased risk for oropharyngeal dysphagia and aspiration given nature of ACDF surgery, swallowing changes that may occur as he recovers from surgery due to potential post-operative swelling/irritation, likely timeline for dysphagia, and current diet recommendations and safe swallowing precautions. Educated the pt and his wife on clinical signs and sypmtoms of aspiration/dysphagia to monitor for over the next several days and encouraged them to notify his physicians if he begins to experiences any significant increase or worsening of symptoms, as this may indicate need for further assessment of his swallowing, such as a modified barium swallow study.  Also explained the risks associated with aspiration/dysphagia.  Patient and his wife both demonstrated excellent understanding to all education that was provided.  See addressed goals below.  At this time, no further acute SLP intervention is indicated, unless the pt begins to have any new or worsening changes to his swallowing.  Thank you for the opportunity to participate in this patient's care.      Plan/Recommendation:   - Diet: Regular diet as tolerated with Thin liquids  - Discharge from SLP- no acute SLP needs at this time  - SLP at discharge is not anticipated      Orientation:  Person: Yes  Place: Yes  Time: Yes  Situation: Yes    Pain:  Pain Score: 0 - No Pain    Aspiration  Risk  Risk for Aspiration: Mild  Dysphagia Diagnosis: WFL  Prognosis: Excellent  Prognosis Considerations: Age, Co-morbidities, Family/community support, Medical diagnosis, Medical prognosis,  Potential  Potential: Excellent  Potential Considerations: Prediction of recovery, Prediction of outcome, Forecast, Cooperation  Compensatory Swallowing Strategies: Upright as possible for all oral intake, Alternate solids and liquids, Remain upright for 20-30 minutes after meals, Small bites/sips, Multiple swallows per bite/sip, Eat/feed slowly  Recommended Solid for Diet: Regular  Recommended Liquid for Diet: thin liquids  Medication Administration: whole      Goals  Goals Short Term Dysphagia  [name] will demonstrate understanding of clinical signs and symptoms of aspiration at 80% accuracy: independently.  >>Goal addressed/met 9/20. Extensive education provided re: overt signs of aspiration (cough, throat clear, wet voice) versus silent aspiration, which can only be visualized during an instrumental swallow assessment.  Encouraged pt and his wife to monitor for these symptoms as an increase/worsening in these symptoms may indicate need for an instrumental swallow evaluation. Pt demonstrated/verbalized understanding with 100% acc independently.  [name] will demonstrate understanding of risks associated with aspiration at 100% accuracy: independently.  >>Goal addressed/met 9/20. Education provided re: risks of aspiration (pneumonia, asphyxiation, prolonged hospitalization, etc). Pt demo'd understanding with 100% accuracy independently.   Patient will demonstrate safe swallowing behaviors: (sit upright, small sips/bites, alternate solids/liquids, eat/drink slowly, remain upright after meals) with 100% accuracy independently.  >>Goal addressed/met 9/20.  Patient was educated on safe swallowing behaviors and aspiration precautions to reduce risk for dysphagia and aspiration. Pt verbalized understanding of all strategies and was able to demonstrate use of these precautions/strategies while eating and drinking during our session today, independently and without cueing- 100%  accuracy.  Time frame for goals to be met in: 10/29/21  Goals Long Term Dysphagia  Patient will tolerate the least restrictive diet as determined by no overt signs of aspiration and with use of safe swallowing behaviors with 100% accuracy independently.  >>Goal addressed and met 9/20.  Pt exhibits no overt, clinical signs of aspiration this date with thin liquids, puree, or hard solids across several trials. He self-feeds all trials and demonstrates use of safe swallow behaviors with 100% accuracy, independently.   Time frame for goals to be met in: 11/05/21      Baseline Assessment  History of Intubation:  (for OR only)  Behavior/Cognition: Alert;Cooperative  Dentition: Adequate  General appearance of oral cavity: WFL  Baseline diet: regular/thin liquids  Current diet: regular/thin liquids  Vision: Functional for self-feeding  Patient Positioning: Upright in bed  Volitional Cough: Strong  Volitional Swallow: WFL    Respiratory Status  Respiratory Status: Room air    Cranial Nerve & Laryngeal Function Exam  CNV- Trigeminal: Within Functional Limits    CNVII - Facial : Within Functional Limits    CNIX - Glossopharyngeal: Within Functional Limits    CNX - Vagus: Within Functional Limits    CNVXII - Hypoglossal Status: Within Functional Limits    Dentition and Hearing  Dentition: Adequate  Hearing Exceptions: None    Consistencies Assessed  Thin;Puree;Hard Solid    Thin  Presentation: Straw;Cup;Self Fed  Oral: Within functional limits  Pharyngeal: No overt s/s of aspiration;Multiple swallows per bolus;Laryngeal elevation appreciated upon palpation    Puree  Presentation: Spoon;Self Fed  Oral: Within functional limits  Pharyngeal: No overt s/s of aspiration    Solid  Presentation: Self Fed  Oral: Within functional limits  Pharyngeal: No overt s/s of aspiration  Patient and Family Education  Patient and Family Education: Patient educated on, Family educated on, dysphagia, current POC, instrumental procedure, role of  SLP, swallowing anatomy & physiology, safe swallowing behaviors, current diet recommendations, risks of aspiration  Education response: Patient demonstrated understanding, Family demonstrated understanding      End of Session:  Patient was left in bed with call light within reach and all needs met.  Safety handoff completed with RN.       Tylene Fantasia, MA, Theatre stage manager -- Rehab Services  Registered MBSImP Clinician  [phone] (office)   [email or web address]  T-F 7:30-4:00     Time  Start Time: 9629  Stop Time: 1039  Time Calculation (min): 53 min    Charges   $Clinical Swallow: 1 Procedure  $Swallow Tx : 1 Procedure      Patient Class   Observation

## 2021-10-20 NOTE — Unmapped (Signed)
[facility]  DEPARTMENT OF NEUROSURGERY  DISCHARGE INSTRUCTIONS  *DIET:     -Patient to continue on a regular diet, advance as tolerated.    -Recommend vitamin D and calcium supplement during first 8-12 weeks after spinal fusion, if not already taking.    *ACTIVITY:  -Progressively increase your activity as tolerated following the recommendations of any inpatient or outpatient postoperative therapy recommendations.  -No bending at waist, lifting >10lbs, no sudden twisting.    -Nothing more strenuous than daily activities or walking.  No running, jogging, swimming, etc...until cleared by neurosurgeon in outpatient follow up.    -Do not drive unless instructed otherwise by your neurosurgeon.  Avoid driving/operating dangerous or heavy machinery while on pain, sedative, or seizure medications unless instructed otherwise.    *WOUND CARE:   -Clean the incision daily with soap and water  -Do not directly scrub the incision for the first week, gently rinse with warm water.    -Do not soak, swim, or bathe incision site in tub for at least two weeks.    *MEDICATION INSTRUCTIONS:   -Wean off narcotic pain medications as tolerated.  -Do not take NSAIDs (eg ibuprofen/Advil, naproxen/Alleve, celecoxib/Celebrex, diclofenac/Voltaren, ketorolac, etc), Aspirin, Plavix, Coumadin, Heparin or other anticoagulant/antiplatelet agents for 2 weeks after surgery unless instructed otherwise.  These medications may increase your risk of bleeding  -No fish oil or saw palmetto for 2 weeks as they may increase the risk of bleeding.  -Avoid NSAIDs (eg ibuprofen/Advil, naproxen/Alleve, celecoxib/Celebrex, diclofenac/Voltaren, ketorolac, etc) and aspirin, if not otherwise indicated, for 12 weeks after surgery. NSAIDs, aspirin, and other anti-inflammatories may slow bone healing and delay fusion.  -Constipation is a common side effect of opiate pain medications; you should take a daily stool softener to help prevent  constipation from developing while on narcotic pain medications.  One or two medications should have been provided at discharge to prophylax against constipation. Should you experience constipation despite these medications, please try the prescription medications at the higher dose indicated or, alternatively, you may try over-the-counter medications like bisacodyl, colace, senokot, or miralax. Contact our office should the condition worsen or should you stop passing gas.    -Should you experience medication side effects or intolerance, stop that medication immediately and call the office [phone]). Should you experience severe side effects present to you primary care doctor or the nearest urgent care/emergency room.    *THERAPY:  -No indication for PT/OT at this time.    FOLLOW-UP  -Follow up with Dr. Joseph S Cheng, MD in clinic in approximately 2 weeks.  The patient or facility can call [phone] to schedule/confirm your appointment.  -We recommend that you follow up with your primary care physician within one month of surgery.    -If any questions or concerns should arise, the patient can call the office.  If after business hours you can call the hospital operator at [phone] and ask to speak to the neurosurgery resident on call.  -Please call the office should you experience: fever >101.5 F, increased uncontrollable or unremitting pain, drainage, redness, or opening of the incision, new weakness or change in sensation, severe headaches, unremitting nausea/vomitting, painful swelling of the foreleg/calf.  -Have someone else bring you to the nearest ER or call 911 should you experience: stroke or stroke-like symptoms, purulent (pus) drainage from incision, loss of consciousness, acute loss of movement/paralysis of any extremity, loss of bowel/bladder control, seizure or seizure-like event, severe chest pain or difficulty breathing.  -If any   questions or concerns should arise, the patient can call the  office.  If after business hours you can call the hospital operator at [phone] and ask to speak to the neurosurgery resident on call.

## 2021-10-21 MED ORDER — naloxone (NARCAN) 4 mg/actuation Spry
4 | NASAL | 1 refills | 1.00000 days | Status: AC | PRN
Start: 2021-10-21 — End: 2021-12-31

## 2021-10-21 MED ORDER — oxyCODONE (ROXICODONE) 5 MG immediate release tablet
5 | ORAL_TABLET | ORAL | 0 refills | PRN
Start: 2021-10-21 — End: 2021-11-09

## 2021-10-21 MED ORDER — senna-docusate (SENNA-S) 8.6-50 mg per tablet
8.6-50 | ORAL_TABLET | Freq: Two times a day (BID) | ORAL | 0 refills | 30.00000 days
Start: 2021-10-21 — End: 2021-12-31

## 2021-10-21 MED ORDER — acetaminophen (TYLENOL) 325 MG tablet
325 | ORAL_TABLET | Freq: Four times a day (QID) | ORAL | 0 refills | PRN
Start: 2021-10-21 — End: 2021-11-09

## 2021-10-21 NOTE — Unmapped (Signed)
Aggie Cosier calling  No medications were sent to pharmacy and pt was discharged yesterday     [facility] [place]  [place] [address] [phone]

## 2021-10-22 NOTE — Unmapped (Signed)
MRN: [id number]   Specialty: neurosurgery    Patient Name: Matthew [name]     Patient Date of Birth: Jul 08, 1945     Relationship of Caller to Patient and Callback: [phone]    Patient of: Wilford Grist of Call: HAD SURGERY ON 9/19, EXPERIENCING SWELLING AROUND THE AREA     On Call Provider Contacted: CASS, DARYN    Time and Method of Contact: PAGED@ 10:50PM    Advise Caller: If provider does not call back within 30 minutes, please call us back.    ROUTE TELEPHONE NOTE - Follow Qgenda and/or Route Directly to Provider.    COPY this note into AFTERHOURS Teams chat.

## 2021-10-22 NOTE — Unmapped (Signed)
Brief Neurosurgery Update Note:     Incision is swollen around the stitched area with 1-2 in swollen to the left and right to the incision - the swelling around the area of the incision. Theres no draining from the incision or redness around the incision.     Patient states he is more sore today and generally sore - but not having trouble breathing or swallowing. Patient does not have headaches or headaches when he sits up.     Patient messaged picture in the chart. As he is only POD 3 I highly doubt infectious source. This could be secondary to CSF leakage or normal postoperative swelling.     I recommended the patient draw a line around swelling to demarcate it. I instructed patient to come to ED with trouble breathing, swallowing, or fevers. I also instructed patient to sleep with head upright to assist with breathing.     Vickie EpleyNatalie Eulogia Dismore, MD   Neurosurgery Resident   11:23 PM 10/22/2021  Pager - [phone]

## 2021-10-25 NOTE — Unmapped (Signed)
Pt's wife Mordecai Maesherese is calling to report that the pt has significant swelling at the incision site and has sent over pictures through Mychat. Mordecai Maesherese is asking that someone get back to her about this.       Ph [phone]

## 2021-10-25 NOTE — Unmapped (Signed)
Patient complains of incision swelling. No warmth, no drainage, no fever. Has posterior neck pain since surgery. Provider J. Cheron Coryell CNP aware. Appointment made for tomorrow for evaluation.

## 2021-10-25 NOTE — Unmapped (Signed)
LVM to schedule appointment.

## 2021-10-26 ENCOUNTER — Ambulatory Visit: Admit: 2021-10-26 | Payer: MEDICARE

## 2021-10-26 DIAGNOSIS — Z981 Arthrodesis status: Secondary | ICD-10-CM

## 2021-10-26 NOTE — Unmapped (Signed)
RN called patient to sched appt     1pm tomorrow at [facility]

## 2021-10-26 NOTE — Unmapped (Signed)
Neurosurgery Clinic Visit    Reason for Visit / Chief Complaint:   Chief Complaint   Patient presents with    Follow-up     Swelling        History of Present Illness: 76 y.o. male w/ history of dysphagia, cervical stenosis, DISH, and cervical disc disease presents for post-operative visit s/p C3-C4 ACDF with repair of dural tear & exposure via ENT (Dr. Micheal Likens).  He presents today due to a fluid pocket underlying the incision which has increased in size over the past several days.  He denies fevers, drainage, headaches, swallowing difficulty, dysphonia, & difficulty breathing/SOB.      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    Environmental allergies     Hearing loss     left (since age 47), right more recent    High cholesterol     Hypertension     Neck symptom     Trouble turning head to the left     Sleep apnea     Sleep disorder        Past Surgical History:   Procedure Laterality Date    ACHILLES TENDON REPAIR  01/31/2002    left     APPENDECTOMY      HERNIA REPAIR  02/01/1951    KNEE SURGERY Left 1976    SPINE SURGERY      TONSILLECTOMY      WISDOM TOOTH EXTRACTION         Social History     Socioeconomic History    Marital status: Married     Spouse name: Not on file    Number of children: Not on file    Years of education: Not on file    Highest education level: Not on file   Occupational History    Not on file   Tobacco Use    Smoking status: Never    Smokeless tobacco: Never   Vaping Use    Vaping Use: Never used   Substance and Sexual Activity    Alcohol use: Yes     Alcohol/week: 4.0 standard drinks     Types: 2 Glasses of wine, 2 Cans of beer per week    Drug use: No     Comment: 02/16/10    Sexual activity: Not on file   Other Topics Concern    Caffeine Use Yes    Occupational Exposure Not Asked    Exercise Yes    Seat Belt Yes   Social History Narrative    Not on file     Social Determinants of Health      Financial Resource Strain: Not on file   Physical Activity: Not on file   Stress: Not on file   Social Connections: Not on file   Housing Stability: Not on file       The patient has a family history of   Family History   Problem Relation Age of Onset    Colon Cancer Mother     Heart failure Father        History from paper and electronic chart reviewed. Patient has no additional relevant past medical, past surgical, family, or social history.    Medications:  Current Outpatient Medications   Medication Sig    acetaminophen Take 3 tablets (975 mg total) by mouth every 6 hours as needed (  Give 1 hour pre-op).    ALPHA LIPOIC ACID ORAL Take 1 capsule by mouth daily.    amLODIPine Take 1 tablet (5 mg total) by mouth daily.    azelastine Use 2 sprays into each nostril 2 times a day.    baclofen Take 1 tablet (10 mg total) by mouth daily as needed (Muscle spasms).    bimatoprost Place 1 drop into both eyes daily.    brimonidine Place 1 drop into the right eye 2 times a day.    cetirizine Take 1 tablet (10 mg total) by mouth daily.    doxepin Take 1 capsule (10 mg total) by mouth at bedtime.    finasteride Take 0.5 mg by mouth daily.    gabapentin Take 1 tablet (600 mg total) by mouth 3 times a day.    naloxone Apply 1 spray in one nostril if needed. Call 911. May repeat dose in other nostril if no response in 3 minutes.    oxyCODONE Take 1 tablet (5 mg total) by mouth every 4 hours as needed for up to 20 doses.    rosuvastatin Take 1 tablet (10 mg total) by mouth every evening.    senna-docusate Take 1 tablet by mouth 2 times a day.    tadalafiL Take 1 tablet (20 mg total) by mouth daily as needed for Erectile Dysfunction.    timolol Place 1 drop into both eyes every 12 hours.     No current facility-administered medications for this visit.     Medications from paper and electronic chart reviewed.  Patient has no additional pertinent medications    Physical Exam:    Vitals:    10/26/21 1308   BP: 136/79   BP Location:  Right upper arm   Patient Position: Sitting   BP Cuff Size: Regular   Pulse: 70   SpO2: 97%   Weight: 174 lb (78.9 kg)   Height: 5' 11 (1.803 m)       Physical Exam:  Vitals:  Vitals:    10/26/21 1308   BP: 136/79   Pulse: 70   SpO2: 97%       General:  in no apparent distress and well developed and well nourished; appears stated age  HENT:   NC/AT, external ears normal to inspection, hearing normal; trachea deviated to left roughly 1 cm - no stridor or use of accessory muscles   Eye:   normal conjunctiva/cornea, no scleral icterus, no injection, EOM intact, PERRL  Pulm:  no respiratory distress   Wound/Incision:  with no signs of drainage, redness; large fluctuant fluid collection over incision - sutures intact       Neurologic Exam:  Mental Status: Alert/Oriented to person, place and day  Speech: Normal, Answers questions appropriately  Cranial Nerves: Face symmetric, tongue midline, hearing & vision grossly intact   Memory:  recent and remote memory intact    Motor: Moving all extremities spontaneously, full ROM, symmetric and full strength in all extremities  Sensation: SILT in all extremities;             Imaging reviewed:   X-ray Cervical Spine 2 or 3-views  Narrative: EXAM: XR CERVICAL SPINE 2 OR 3-VIEWS      INDICATION:  Other - Must specify in Comments field,  C3-4 ACDF    TECHNIQUE:  XR CERVICAL SPINE 2 OR 3-VIEWS     COMPARISON: Cervical spine fluoroscopy on 10/19/2021, MRI cervical spine on 08/17/2021.    FINDINGS:    Postsurgical  changes from interbody disc spacer at C3-4 without evidence of hardware fracture or loosening. Anterior neck surgical drain at the level of C4 on the right. Cervical vertebral bodies are normal alignment. C6 and C7 vertebral bodies are partially obscured by soft tissue. Vertebral body heights are maintained. Mild lower cervical degenerative disc disease. Posterior elements are intact. No prevertebral soft tissue swelling.  Impression: IMPRESSION:    1.  Interbody disc spacer at  C3-4 without hardware complication.  2.  Mild lower cervical degenerative disc disease.    Approved by Carmelia Bake, DO on 10/20/2021 1:36 PM EDT    I have personally reviewed the images and I agree with this report.    Report Verified by: Shelba Flake, MD at 10/20/2021 1:57 PM EDT      Assessment  Matthew [name] 76 y.o. male with Matthew [name] was seen today for follow-up.    Diagnoses and all orders for this visit:    S/P cervical spinal fusion  -     Head and Neck Surgery  -     X-ray Cervical Spine 2 or 3-views; Future          1. S/P cervical spinal fusion  Head and Neck Surgery    X-ray Cervical Spine 2 or 3-views            Plan  76 y.o. male w/ history of dysphagia, cervical stenosis, DISH, and cervical disc disease presents for post-operative visit s/p C3-C4 ACDF with repair of dural tear & exposure via ENT (Dr. Micheal Likens).  He presents today due to a fluid pocket underlying the incision which has increased in size over the past several days.  He denies fevers, drainage, headaches, swallowing difficulty, dysphonia, & difficulty breathing/SOB.    Pt has suspected CSF leak with tracheal deviation but shows no signs of SOB, dysphagia, cerebral hypotension headache, or infection.  Will have pt rtc in 1 week for suture removal.  Pt to see ENT tomorrow at 1 pm for potential US guided drainage of fluid pocket.   Portions of this clinic note was copied forward from my last progress note. I have reviewed and updated the history, physical exam, data, assessment and plan of the note so it reflects the evaluation and management of the patient on today's date   Today's body mass index is 24.27 kg/m. BMI is normal.    I spent a total of 30 minutes obtaining a history, examining, and coordinating care for this patient.  This time spent includes the details in my assessment and plan above and also the following activities performed on the same day as the encounter: preparing to see the patient (eg, review of tests),  obtaining and/or reviewing separately obtained history, counseling and educating the family/caregiver , ordering medications, tests, or procedures, referring and communicating with other health care professionals , documenting clinical information in the electronic or other health record, independently interpreting results and communicating results to the patient/family/caregiver, and providing care coordination      Thank You    Derrill Center, CNP  10/26/2021  [facility]  [place] [facility]  [address], [address]  [place] [address]  Dept: [phone]  Loc: [phone]

## 2021-10-26 NOTE — Unmapped (Signed)
Received message from NP Ivin Booty, RN returned call    Small dural tear with CSF leak, now with superficial fluid pocket causing left tracheal deviation. No SOB. Dr Sena Hitch would like appt with Y[facility] to see if collection if drainable under US guidance.   RN advised will discuss with YJP and call patient to sched    Spoke with Dr Micheal Likens, recommends office appt tomorrow

## 2021-10-26 NOTE — Unmapped (Signed)
An After Visit Summary was printed and given to the patient.    Follow up in 1 week with Xrays prior to your visit.    A referral was sent to ENT for Dr. Micheal Likens.

## 2021-10-27 ENCOUNTER — Ambulatory Visit: Admit: 2021-10-27 | Discharge: 2021-10-27 | Payer: MEDICARE

## 2021-10-27 DIAGNOSIS — R221 Localized swelling, mass and lump, neck: Secondary | ICD-10-CM

## 2021-10-27 NOTE — Unmapped (Signed)
CC: Radiculopathy      History of Present Illness:  Patient is 76 y.o. male s/p C3-C4 ACDF with repair of dural tear on 10/19/21 with Dr Sena Hitchheng. Seen by neurosurg yesterday with increasing anterior neck swelling, felt to be CSF leak vs seroma causing tracheal deviation with no SOB.     Today reports voice and swallowing are okay     Pain: no  Otalgia: no  Odynophagia: no  Dysphagia: No  Dyspnea: No  Lumps in neck: No  Hemoptysis: no  Fever: No  Chills: No  Sweats: No  Weight Loss: No      Reports history of chronic cough    Allergies  Animal dander and Adhesive tape-silicones    Medications  Outpatient Encounter Medications as of 10/27/2021   Medication Sig Dispense Refill    acetaminophen (TYLENOL) 325 MG tablet Take 3 tablets (975 mg total) by mouth every 6 hours as needed (Give 1 hour pre-op). 100 tablet 0    ALPHA LIPOIC ACID ORAL Take 1 capsule by mouth daily.      amLODIPine (NORVASC) 5 MG tablet Take 1 tablet (5 mg total) by mouth daily.      azelastine (ASTELIN) 137 mcg (0.1 %) nasal spray Use 2 sprays into each nostril 2 times a day. 30 mL 6    baclofen (LIORESAL) 10 MG tablet Take 1 tablet (10 mg total) by mouth daily as needed (Muscle spasms).      bimatoprost (LUMIGAN) 0.01 % Drop Place 1 drop into both eyes daily.      brimonidine (ALPHAGAN) 0.2 % ophthalmic solution Place 1 drop into the right eye 2 times a day.      cetirizine (ZYRTEC) 10 MG tablet Take 1 tablet (10 mg total) by mouth daily. 30 tablet 11    doxepin (SINEQUAN) 10 MG capsule Take 1 capsule (10 mg total) by mouth at bedtime.      finasteride (PROSCAR) 5 mg tablet Take 0.5 mg by mouth daily.      gabapentin (NEURONTIN) 600 MG tablet Take 1 tablet (600 mg total) by mouth 3 times a day. 270 tablet 1    naloxone (NARCAN) 4 mg/actuation Spry Apply 1 spray in one nostril if needed. Call 911. May repeat dose in other nostril if no response in 3 minutes. 2 each 1    oxyCODONE (ROXICODONE) 5 MG immediate release tablet Take 1 tablet (5 mg total) by  mouth every 4 hours as needed for up to 20 doses. 20 tablet 0    rosuvastatin (CRESTOR) 10 MG tablet Take 1 tablet (10 mg total) by mouth every evening. 30 tablet 5    senna-docusate (SENNA-S) 8.6-50 mg per tablet Take 1 tablet by mouth 2 times a day. 30 tablet 0    tadalafiL (CIALIS) 20 MG tablet Take 1 tablet (20 mg total) by mouth daily as needed for Erectile Dysfunction.      timolol (TIMOPTIC) 0.5 % ophthalmic solution Place 1 drop into both eyes every 12 hours.       No facility-administered encounter medications on file as of 10/27/2021.        Review of Systems:  * See scanned Review of Systems sheet.    Vitals  There were no vitals taken for this visit.    PHYSICAL EXAM   General:     General Appearance: well-developed, well-nourished and in no acute distress    Communication: I was able to converse well with the patient. Patient was able to answer  questions adequately and appropriately.    Head & Face (general): normal appearance    Face (palpation): no sinus tenderness    Salivary Glands (palpation): salivary glands NL size.    Voice Quality: Normal  Eyes:     External: anicteric sclera, conjunctiva not injected, lids no lesions or swelling    Extraoccular Muscle: intact  Nose:     External: external nose without infection or abnormality.    Internal: anterior rhinoscopy performed. Turbinates non-erythematous, non-swollen, no pus, septum midline, mucosa intact. No masses, polyps or pus. No septal perforation.  Ears:     External Ears: external ears are of normal appearance. No masses, lesions or scars.  House-Brackman Grading System for Facial Nerve Dysfunction:     (Left) Grade 1: Normal movement    (Right) Grade 1: Normal movement  Mouth:     Lip/teeth/gums: healthy dentition, lips, teeth and gums in good condition. no gingival inflammation, no labial lesions. Mucosa: No leukoplakia or masses. Hard/soft palates and tongue of NL symmetry.    Tongue: normal midline, normal mucosa    Oropharynx/ Tonsils:  pharyngeal walls and tonsillar fossae without abnormalities.    Neck:     Right neck fullness - soft   Lymphatic:     No cervical, supraclavicular or auricular adenopathy  Salivary glands NL size.    Thyroid Exam: no nodules, masses, tenderness or enlargement of thyroid on palpation.  Respiratory Inspection:     Respiratory Effort: breathing comfortably, no increased work of breathing.  Cardiovascular:     Carotid arteries: pulses 2+, symmetric, no bruits  Skin:     Inspection: no lower extremity edema, rashes, lesions, or ulcerations, well developed, turgor intact    Palpation: no subcutaneous nodules or induration  Musculoskeletal:     Gait and Station: intact without difficulty  Neurologic:     Cranial Nerves: II - XII grossly intact  Mental Status:     Orientation: oriented to time, place, and person    Mood and affect: NL mood and affect. A+O x 4.      Flexible Laryngoscopy without biopsy  Anesthesia: Topical 1% Xylocaine with Afrin  Estimated Blood Loss: None    Procedure:   After obtaining consent, the patient was placed in the examination chair in the upright position.  Decongestant and topical anesthetic was sprayed in the right and left nostrils.  After allowing adequate time for hemostatic effect, the flexible 4 mm laryngoscope was passed via the right and left nostrils.      Nasal Septum: normal;   Nasal Findings: normal;   Nasopharynx: normal   Eustachian Tube: normal   Oropharynx: normal   Base of Tongue: normal   Epiglottis: normal   True Vocal Cord normal   False Vocal Cord: normal   True Vocal Cord Movement: normal   Hypopharynx Mucosa: normal    Exam Completed - 10/27/2021     * Patient tolerated the procedure well with no complications   * Patient was instructed not to eat for 30 minutes following procedure.   * Patient was instructed that they may notice minor bleeding.      Airway is widely patent        NECK ULTRASOUND    Anesthesia: None  Complications: None  Est. Blood Loss: None  Procedure:  neck US    The patient was placed in a semi-recumbent position with mild neck extension. Real time B-mode ultrasound of the neck was performed in transverse/ axial and longitudinal/ sagital planes.  All specimens were placed on glass slides or cytolyte solution and labelled as to the origin of the specimens.   The patient tolerated the procedure well.       LOCULATED fluid collection    Lab Review:   Lab Results   Component Value Date    WBC 11.4 (H) 10/20/2021    HGB 12.8 (L) 10/20/2021    HCT 36.4 (L) 10/20/2021    MCV 93.6 10/20/2021    PLT 136 (L) 10/20/2021    CREATININE 0.86 10/20/2021    BUN 22 10/20/2021    NA 142 10/20/2021    K 4.0 10/20/2021    CL 105 10/20/2021    CO2 27 10/20/2021    ALT 23 10/19/2021    AST 23 10/19/2021    ALKPHOS 73 10/19/2021    BILITOT 0.7 10/19/2021            Assessment and Plan:    76 y.o. male s/p ACDF and repair of dural tear with anterior neck swelling. This does not appear to be blood or hematoma because it is so soft. US guided drainage would create potential for infection and would not drain deeper portion of collection    Fu Friday    Seeing Cheng next week and d/w nsx    Portions of the note for this encounter was transcribed by Brunilda Payor, RN acting as a scribe when documenting those areas.  I personally performed the history, physical exam, procedure and medical decision making.  The documentation recorded by the scribe accurately reflects the services I performed and the decisions made by me  This progress note was copied forward from the note written by Kallie Locks MD from my last clinic/office note. I have reviewed and updated the history, physical exam, data, assessment and plan of the note so that it reflects the evaluation and management of the patient on 10/27/21.

## 2021-10-28 NOTE — Unmapped (Signed)
Called patient,  States he is doing about the same, no changes to voice or swallow, patient with no acute concerns. Confirmed appt with YJP tomorrow, advised to call office if any changes in condition

## 2021-10-28 NOTE — Unmapped (Signed)
RN called patient to check-in  No answer    Called EC, wife Mordecai Maesherese  No answer  Left message for callback

## 2021-10-29 ENCOUNTER — Inpatient Hospital Stay: Admit: 2021-10-29 | Payer: MEDICARE

## 2021-10-29 ENCOUNTER — Ambulatory Visit: Admit: 2021-10-29 | Discharge: 2021-11-04 | Payer: MEDICARE

## 2021-10-29 ENCOUNTER — Inpatient Hospital Stay
Admit: 2021-10-29 | Discharge: 2021-11-09 | Disposition: A | Discharge status: Home / Self Care | Payer: MEDICARE | Source: Ambulatory Visit

## 2021-10-29 ENCOUNTER — Inpatient Hospital Stay: Payer: MEDICARE

## 2021-10-29 DIAGNOSIS — G9600 Cerebrospinal fluid leak, unspecified: Secondary | ICD-10-CM

## 2021-10-29 DIAGNOSIS — R221 Localized swelling, mass and lump, neck: Secondary | ICD-10-CM

## 2021-10-29 DIAGNOSIS — Z981 Arthrodesis status: Secondary | ICD-10-CM

## 2021-10-29 MED ORDER — senna-docusate (SENNA-S) 8.6-50 mg per tablet 1 tablet
8.6-50 | Freq: Two times a day (BID) | ORAL
Start: 2021-10-29 — End: 2021-11-09
  Administered 2021-10-30 – 2021-11-09 (×17): 1 via ORAL

## 2021-10-29 MED ORDER — ondansetron (ZOFRAN) injection 4 mg
4 | Freq: Four times a day (QID) | INTRAMUSCULAR | PRN
Start: 2021-10-29 — End: 2021-11-09
  Administered 2021-10-31 – 2021-11-02 (×2): 4 mg via INTRAVENOUS

## 2021-10-29 MED ORDER — acetaminophen (TYLENOL) tablet 650 mg
325 | Freq: Four times a day (QID) | ORAL | PRN
Start: 2021-10-29 — End: 2021-10-30
  Administered 2021-10-29: 19:00:00 650 mg via ORAL

## 2021-10-29 MED ORDER — oxyCODONE (ROXICODONE) immediate release tablet 5 mg
5 | ORAL | PRN
Start: 2021-10-29 — End: 2021-11-01
  Administered 2021-10-30 – 2021-11-01 (×9): 5 mg via ORAL

## 2021-10-29 MED ORDER — bisacodyL (DULCOLAX) suppository 10 mg
10 | Freq: Every day | RECTAL | PRN
Start: 2021-10-29 — End: 2021-11-09
  Administered 2021-11-03: 22:00:00 10 mg via RECTAL

## 2021-10-29 MED FILL — TYLENOL 325 MG TABLET: 325 325 mg | ORAL | Qty: 2

## 2021-10-29 NOTE — Unmapped (Signed)
Reviewed plan of care with patient they will go to [facility] for direct admit.

## 2021-10-29 NOTE — Unmapped (Signed)
[facility]  DEPARTMENT OF NEUROSURGERY  INPATIENT HISTORY & PHYSICAL EXAM/CONSULT NOTE    Matthew [name]  [id number]  09-28-1945    Neurosurgery Attending:  Vella Redhead, MD  Primary Care Physician:  Demetrius Charity, MD  Referring Provider:    Tempie Hoist, MD    HISTORY  Chief Complaint: Neck swelling    HPI:  76 y.o. 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 Patient underwent a C3-4 ACDF on 09/19 with intraoperative CSF leak. He had one JP drain in place and was discharged from the hospital after the drain was discontinued with no intial neck swelling. He had progressive neck stiffness, without significant neck pain, and swelling which started Saturday and progressed throughout the week. The patient denies new numbness, tingling, or weakness but states his neck is stiff as it was prior to surgery. Patient admitted with concern for continued CSF leak.     Other pertinent diagnoses: None    Review of Systems:  History obtained from patient and chart review.   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    Environmental allergies     Hearing loss     left (since age 56), right more recent    High cholesterol     Hypertension     Neck symptom     Trouble turning head to the left     Sleep apnea     Sleep disorder      Past Surgical History:   Procedure Laterality Date    ACHILLES TENDON REPAIR  01/31/2002    left     APPENDECTOMY      HERNIA REPAIR  02/01/1951    KNEE SURGERY Left 1976    SPINE SURGERY      TONSILLECTOMY      WISDOM TOOTH EXTRACTION         Social History:  Social History     Socioeconomic History    Marital status: Married     Spouse name: Not on file    Number of children: Not on file    Years of education: Not on file    Highest education level: Not on file    Occupational History    Not on file   Tobacco Use    Smoking status: Never    Smokeless tobacco: Never   Vaping Use    Vaping Use: Never used   Substance and Sexual Activity    Alcohol use: Yes     Alcohol/week: 4.0 standard drinks     Types: 2 Glasses of wine, 2 Cans of beer per week    Drug use: No     Comment: 02/16/10    Sexual activity: Not on file   Other Topics Concern    Caffeine Use Yes    Occupational Exposure Not Asked    Exercise Yes    Seat Belt Yes   Social History Narrative    Not on file     Social Determinants of Health     Financial Resource Strain: Not on file   Physical Activity: Not on file   Stress: Not on file   Social Connections: Not on file   Housing Stability: Not on file       Family History:  Family History   Problem Relation Age of Onset    Colon Cancer Mother     Heart failure Father        Meds  Prior to Admission medications    Medication Sig Start Date End Date Taking? Authorizing Provider   acetaminophen (TYLENOL) 325 MG tablet Take 3 tablets (975 mg total) by mouth every 6 hours as needed (Give 1 hour pre-op). 10/21/21   Vanita Panda, CNP   ALPHA LIPOIC ACID ORAL Take 1 capsule by mouth daily.    Historical Provider, MD   amLODIPine (NORVASC) 5 MG tablet Take 1 tablet (5 mg total) by mouth daily.    Historical Provider, MD   azelastine (ASTELIN) 137 mcg (0.1 %) nasal spray Use 2 sprays into each nostril 2 times a day. 10/01/20   Lorretta Harp, MD   baclofen (LIORESAL) 10 MG tablet Take 1 tablet (10 mg total) by mouth daily as needed (Muscle spasms).    Historical Provider, MD   bimatoprost (LUMIGAN) 0.01 % Drop Place 1 drop into both eyes daily. 06/13/11   Historical Provider, MD   brimonidine (ALPHAGAN) 0.2 % ophthalmic solution Place 1 drop into the right eye 2 times a day.    Historical Provider, MD   cetirizine (ZYRTEC) 10 MG tablet Take 1 tablet (10 mg total) by mouth daily. 10/01/20   Lorretta Harp, MD   doxepin (SINEQUAN) 10 MG capsule Take 1 capsule (10 mg total) by  mouth at bedtime.    Historical Provider, MD   finasteride (PROSCAR) 5 mg tablet Take 0.5 mg by mouth daily. 04/24/11   Historical Provider, MD   gabapentin (NEURONTIN) 600 MG tablet Take 1 tablet (600 mg total) by mouth 3 times a day.  Patient taking differently: Take 1 tablet (600 mg total) by mouth at bedtime. 08/18/21 02/14/22  Loura Halt, MD   naloxone [facility]) 4 mg/actuation Spry Apply 1 spray in one nostril if needed. Call 911. May repeat dose in other nostril if no response in 3 minutes. 10/21/21   Vanita Panda, CNP   oxyCODONE (ROXICODONE) 5 MG immediate release tablet Take 1 tablet (5 mg total) by mouth every 4 hours as needed for up to 20 doses. 10/21/21 10/28/21  Vanita Panda, CNP   rosuvastatin (CRESTOR) 10 MG tablet Take 1 tablet (10 mg total) by mouth every evening. 10/06/21 04/04/22  Historical Provider, MD   senna-docusate (SENNA-S) 8.6-50 mg per tablet Take 1 tablet by mouth 2 times a day. 10/21/21   Vanita Panda, CNP   tadalafiL (CIALIS) 20 MG tablet Take 1 tablet (20 mg total) by mouth daily as needed for Erectile Dysfunction. 08/24/21   Historical Provider, MD   timolol (TIMOPTIC) 0.5 % ophthalmic solution Place 1 drop into both eyes every 12 hours.    Historical Provider, MD       Allergies  Allergies   Allergen Reactions    Animal Dander      Causes congestion    Adhesive Tape-Silicones Rash       EXAMINATION  Temp:  [97.2 F (36.2 C)-98.2 F (36.8 C)] 98.2 F (36.8 C)  Heart Rate:  [60-86] 78  Resp:  [16-18] 18  BP: (107-137)/(63-81) 107/63    General: NAD  Respiratory: NAD, on RA  MSK: Normal bulk, normal tone  Skin: No erythema, no lesions. See wound below.     Wound: C/D/I with 2 cm of nonerythematous circumfrential swelling on the anterior right neck.  Neurological  -GCS: 4-5-6  -Orientation: alert, oriented x 3 (person, place, and time)  -Language: speech fluent and appropriate  -Motor: FCCx4, full and symmetric strength and ROM                                D             EF          EE          HG         IO                 RUE:      5             5             5             5             5                  LUE:      5             5             5             5             5                                 HF          KE          DF          EHL       PF                 RLE:      5             5             5             5             5                  LLE:       5             5             5             5             5   -Sensation: sensation intact to light touch and symmetric in bilateral upper and lower extremities  -Tone: No abnormality of tone appreciated in bilateral upper or lower extremities        LABORATORY AND RADIOLOGY RESULTS  Labs  Lab Results   Component Value Date    GLUCOSE 120 (H) 10/20/2021    BUN 22 10/20/2021    CO2 27 10/20/2021    CREATININE 0.86 10/20/2021    K 4.0 10/20/2021    NA 142 10/20/2021    CL 105 10/20/2021    CALCIUM 9.1 10/20/2021    MG 2.2 10/19/2021    PHOS 2.7 10/20/2021     Lab Results   Component Value Date    ALT 23 10/19/2021  AST 23 10/19/2021    ALKPHOS 73 10/19/2021    BILITOT 0.7 10/19/2021     Lab Results   Component Value Date    WBC 11.4 (H) 10/20/2021    HGB 12.8 (L) 10/20/2021    HCT 36.4 (L) 10/20/2021    MCV 93.6 10/20/2021    PLT 136 (L) 10/20/2021     Lab Results   Component Value Date    INR 1.0 10/14/2021         No results found for: LIPIDCOMM, CHOLTOT, TRIG, HDL, CHOLHDL, LDL  No results found for: PHART, PCO2, PO2ART, HCO3ART, BEART, HBO2PER, O2SATART  Lab Results   Component Value Date    ABS Negative 10/14/2021    ABOGROUP O 10/19/2021    RH Positive 10/19/2021           Invalid input(s): PLTFUNASP    Imaging Review  CT Neck WO contrast    (Results Pending)        ASSESMENT & PLAN  76 y.o. 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 Patient underwent a C3-4 ACDF on 09/19 with intraoperative  CSF leak. He is neurologically intact but does have visible swelling on his right anterior neck incision without drainage or erythema.       PLAN:  - Admit to neurosurgery service, Dr. Vella Redhead.   - CT Neck Without Contrast tonight  - NPO with coags and basic labs (PTT, INR) prior to OR tomorrow   - OR tomorrow: Neck exploration, CSF leak repair, and LD drain placement.   - Appreciate ENT assistance.      If further questions or concerns should arise, do not hesitate to contact the neurosurgery resident on call [phone].    Vickie Epley, MD   Neurosurgery Resident   8:30 PM 10/29/2021  Pager - [phone]

## 2021-10-29 NOTE — Unmapped (Signed)
[facility]  DIVISION OF OTOLARYNGOLOGY- HEAD & NECK SURGERY  GENERAL INPATIENT CONSULTATION    Patient Name: Matthew [name]  Medical Record Number:  [id number]  Requesting Attending: Tempie Hoist, MD  Consulting Attending: Dr. Micheal Likens  Service Requesting Consult: NSGY.  Primary Care Physician:  Demetrius Charity, MD  Date of Admission: 10/29/2021  Date of Consultation: 10/29/2021    ASSESSMENT  Matthew [name] is a 76 y.o. male s/p C3-4 ACDF on 9/19 complicated by intraoperative CSF leak who presents with 2 days of progressive right neck swelling concerning for continued CSF leak. Transoral exam notable for prominent bulge of posterior pharyngeal wall. He is breathing comfortably on room air and in no distress.    PLAN  - Admitted to NSGY  - Plan for OR tomorrow for neck exploration, CSF leak repair  - ENT consent obtained and in chart  - NPO midnight  - Follow up CT neck  - Page ENT stat if any concerns for impending respiratory distress    This assessment and plan, and all management questions were discussed with the above mentioned ENT attending who agreed.    REASON FOR CONSULT:  Advice requested regarding neck fluid collection    ADMISSION DIAGNOSIS:  <principal problem not specified>    HISTORY OF PRESENT ILLNESS  Matthew [name] is a(n) 76 y.o. male with a history of cervical dystonia and C3-4 disc hernation s/p C3-4 ACDF on 9/19 with intraoperative CSF leak who presents with progressive neck swelling and pain. Seen in ENT and NSGY clinics today with large neck fluid collection concerning for CSF leak vs seroma. He was admitted to NSGY today with plans for OR tomorrow. He denies any dyspnea, stridor, dysphagia.    Patient Active Problem List   Diagnosis    Spasmodic torticollis    Cerebellar ataxia (CMS-HCC)    Neuropathy    Chronic cough    Primary hypertension    HLD (hyperlipidemia)     Past Surgical History:   Procedure Laterality Date    ACHILLES TENDON REPAIR  01/31/2002    left      APPENDECTOMY      HERNIA REPAIR  02/01/1951    KNEE SURGERY Left 1976    SPINE SURGERY      TONSILLECTOMY      WISDOM TOOTH EXTRACTION       Family History   Problem Relation Age of Onset    Colon Cancer Mother     Heart failure Father      Social History     Socioeconomic History    Marital status: Married     Spouse name: Not on file    Number of children: Not on file    Years of education: Not on file    Highest education level: Not on file   Occupational History    Not on file   Tobacco Use    Smoking status: Never    Smokeless tobacco: Never   Vaping Use    Vaping Use: Never used   Substance and Sexual Activity    Alcohol use: Yes     Alcohol/week: 4.0 standard drinks     Types: 2 Glasses of wine, 2 Cans of beer per week    Drug use: No     Comment: 02/16/10    Sexual activity: Not on file   Other Topics Concern    Caffeine Use Yes    Occupational Exposure Not Asked    Exercise Yes    Seat  Belt Yes   Social History Narrative    Not on file     Social Determinants of Health     Financial Resource Strain: Not on file   Physical Activity: Not on file   Stress: Not on file   Social Connections: Not on file   Housing Stability: Not on file       DRUG/FOOD ALLERGIES: Animal dander and Adhesive tape-silicones      CURRENT MEDICATIONS  Prior to Admission medications    Medication Sig Start Date End Date Taking? Authorizing Provider   amLODIPine (NORVASC) 5 MG tablet Take 1 tablet (5 mg total) by mouth daily.   Yes Historical Provider, MD   bimatoprost (LUMIGAN) 0.01 % Drop Place 1 drop into both eyes daily. 06/13/11  Yes Historical Provider, MD   brimonidine (ALPHAGAN) 0.2 % ophthalmic solution Place 1 drop into the right eye 2 times a day.   Yes Historical Provider, MD   gabapentin (NEURONTIN) 600 MG tablet Take 1 tablet (600 mg total) by mouth 3 times a day.  Patient taking differently: Take 1 tablet (600 mg total) by mouth at bedtime. 08/18/21 02/14/22 Yes Loura Halt, MD   rosuvastatin (CRESTOR) 10 MG tablet Take 1  tablet (10 mg total) by mouth every evening. 10/06/21 04/04/22 Yes Historical Provider, MD   timolol (TIMOPTIC) 0.5 % ophthalmic solution Place 1 drop into both eyes every 12 hours.   Yes Historical Provider, MD   acetaminophen (TYLENOL) 325 MG tablet Take 3 tablets (975 mg total) by mouth every 6 hours as needed (Give 1 hour pre-op). 10/21/21   Vanita Panda, CNP   ALPHA LIPOIC ACID ORAL Take 1 capsule by mouth daily.    Historical Provider, MD   azelastine (ASTELIN) 137 mcg (0.1 %) nasal spray Use 2 sprays into each nostril 2 times a day. 10/01/20   Lorretta Harp, MD   baclofen (LIORESAL) 10 MG tablet Take 1 tablet (10 mg total) by mouth daily as needed (Muscle spasms).    Historical Provider, MD   cetirizine (ZYRTEC) 10 MG tablet Take 1 tablet (10 mg total) by mouth daily. 10/01/20   Lorretta Harp, MD   doxepin (SINEQUAN) 10 MG capsule Take 1 capsule (10 mg total) by mouth at bedtime.    Historical Provider, MD   finasteride (PROSCAR) 5 mg tablet Take 0.5 mg by mouth daily. 04/24/11   Historical Provider, MD   naloxone [facility]) 4 mg/actuation Spry Apply 1 spray in one nostril if needed. Call 911. May repeat dose in other nostril if no response in 3 minutes. 10/21/21   Vanita Panda, CNP   oxyCODONE (ROXICODONE) 5 MG immediate release tablet Take 1 tablet (5 mg total) by mouth every 4 hours as needed for up to 20 doses. 10/21/21 10/28/21  Vanita Panda, CNP   senna-docusate (SENNA-S) 8.6-50 mg per tablet Take 1 tablet by mouth 2 times a day. 10/21/21   Vanita Panda, CNP   tadalafiL (CIALIS) 20 MG tablet Take 1 tablet (20 mg total) by mouth daily as needed for Erectile Dysfunction. 08/24/21   Historical Provider, MD          Inpatient Meds:  Scheduled:   senna-docusate  1 tablet Oral BID       Continuous:      ZOX:[id number], bisacodyL, ondansetron, oxyCODONE    REVIEW OF SYSTEMS  Negative except for that in HPI    PHYSICAL EXAM  BP 125/67 (BP Location: Left upper arm, Patient Position:  Lying)   Pulse 82   Temp 98.1 F (36.7 C) (Oral)   Resp 18   Ht 5' 11 (1.803 m)   Wt 171 lb (77.6 kg)   SpO2 100%   BMI 23.85 kg/m     GENERAL: No Acute Distress, Alert and Oriented  EYES: EOMI, Anti-icteric  NOSE: No epistaxis  EARS: Normal external canal appearance  FACE: 1/6 House-Brackmann Scale  ORAL CAVITY: MMM, tongue midline, large bulge along posterior pharyngeal wall   NECK: right neck incision intact with large fluctuant fluid collection. Trachea deviated to the left. Second smaller fluid collection to left of trachea.   CHEST: Normal respiratory effort, no retractions. No stridor    LABORATORY  Past 24 hour labs:   Lab Results   Component Value Date    WBC 11.4 (H) 10/20/2021    HGB 12.8 (L) 10/20/2021    HCT 36.4 (L) 10/20/2021    MCV 93.6 10/20/2021    PLT 136 (L) 10/20/2021     Lab Results   Component Value Date    GLUCOSE 120 (H) 10/20/2021    BUN 22 10/20/2021    CREATININE 0.86 10/20/2021    K 4.0 10/20/2021    NA 142 10/20/2021    CL 105 10/20/2021    CALCIUM 9.1 10/20/2021     Lab Results   Component Value Date    MG 2.2 10/19/2021     Lab Results   Component Value Date    PHOS 2.7 10/20/2021     Lab Results   Component Value Date    ALKPHOS 73 10/19/2021    ALT 23 10/19/2021    AST 23 10/19/2021    BILITOT 0.7 10/19/2021    ALBUMIN 3.8 10/20/2021    PROT 7.1 10/19/2021     No results found for: LDH  No results found for: PTT  No results found for: AMYLASE  No results found for: LIPASE    RADIOLOGY  X-ray Cervical Spine 2 or 3-views    Result Date: 10/20/2021  EXAM: XR CERVICAL SPINE 2 OR 3-VIEWS  INDICATION:  Other - Must specify in Comments field,  C3-4 ACDF TECHNIQUE:  XR CERVICAL SPINE 2 OR 3-VIEWS COMPARISON: Cervical spine fluoroscopy on 10/19/2021, MRI cervical spine on 08/17/2021. FINDINGS: Postsurgical changes from interbody disc spacer at C3-4 without evidence of hardware fracture or loosening. Anterior neck surgical drain at the level of C4 on the right. Cervical vertebral  bodies are normal alignment. C6 and C7 vertebral bodies are partially obscured by soft tissue. Vertebral body heights are maintained. Mild lower cervical degenerative disc disease. Posterior elements are intact. No prevertebral soft tissue swelling.     IMPRESSION: 1.  Interbody disc spacer at C3-4 without hardware complication. 2.  Mild lower cervical degenerative disc disease. Approved by Carmelia Bake, DO on 10/20/2021 1:36 PM EDT I have personally reviewed the images and I agree with this report. Report Verified by: Shelba Flake, MD at 10/20/2021 1:57 PM EDT    X-ray Cervical Spine 2 or 3-views    Result Date: 10/19/2021    XR CERVICAL SPINE 2 OR 3-VIEWS, FL FLUORO UP TO 1 HOUR TECHNIQUE : Intraoperative fluoroscopy was performed without a radiologist present. HISTORY:  Other - Must specify in Comments field FLUOROSCOPY TIME: 18 seconds FINDINGS: Fluoroscopy was performed without a radiologist present.  Spot images were obtained.     IMPRESSION: Intraoperative fluoroscopy was performed. Correlate with procedural note for additional information. Report Verified by: Shelba Flake, MD at 10/19/2021  4:56 PM EDT    CT Neck WO contrast    Result Date: 10/29/2021  EXAM: CT NECK WO CONTRAST INDICATION: operative planning. TECHNIQUE: Axial thin section CT images of the neck were obtained without contrast . Sagittal and coronal 2D multiplanar reconstructions were performed at the scanner.  COMPARISON: None available. FINDINGS: The diagnostic quality of the examination is adequate. Evaluation is limited in the absence of intravenous contrast. Nasopharynx: Symmetric with no evidence of mass. Normal torus tubarius, fossa of Rosenmuller, and adenoid tonsillar tissue. Suprahyoid neck: Streak artifact from dental amalgam and lack of intravenous contrast limits evaluation of the oral cavity. There is a multilobulated fluid collection emanating from the retropharynx at the level of C3 extending rightward into the right  neck inferiorly along the sternocleidomastoid muscle. The collection measures fluid density. Nongeometric shape makes accurate measurements difficult though approximately measures 3.1 x 7.1 x 10 cm. Inferiorly the fluid tracks along the prevertebral soft tissues to the level of C6. Of note, right internal carotid arteries immediately posterior to the lateral component of the fluid collection. Infrahyoid neck:An open glottis is imaged. Normal larynx, hypopharynx, and supraglottis. Lymph nodes: No abnormal lymph nodes are identified. Salivary Glands: The bilateral parotid, submandibular, and sublingual glands demonstrate normal attenuation. No dilated ductal system or calculus confirmed. Thyroid: Normal. Brain: The visualized portions of the brain appear normal. There are atherosclerotic vessel calcifications at the skull base. Orbits, paranasal sinuses, and skull base: No abnormalities of the orbits are identified. The paranasal sinuses are clear. The mastoid air cells are clear. Vascular structures: Minimal calcified plaques are present involving the carotid bifurcations. While not adequately assessed without contrast, no definite stenosis is suspected. Thoracic inlet: The included lung apices demonstrate no suspicious nodule or other significant abnormal finding. Bones: Postsurgical changes related to C3-C4 ACDF with interbody spacer. Flowing bulky ventral osteophytes span C2-T2 with bulky ventral ankylosis. There is disruption of the ankylosis at the surgical site, likely posttraumatic/postsurgical. Posterior vertebral body osteophytes with bulky disc osteophyte complex/ossification of the posterior longitudinal ligament at C3-C4 at least moderately narrows the spinal canal with suspected spinal cord compression. Multilevel uncovertebral hypertrophy contributes to multilevel foraminal stenosis, worst at C3-C4 on the right where there is severe foraminal stenosis..     IMPRESSION: 1.  Nonspecific large  multilobulated prevertebral/right neck fluid collection emanating from the surgical site at C3-C4. This could represent a postoperative seroma/serosanguineous collection though CSF leak and collection is a consideration given the reported intraoperative CSF leak. 2.  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 Report Verified by: Ezzie Dural, DO at 10/29/2021 9:47 PM EDT    Fluoro up to 1 hour    Result Date: 10/19/2021    XR CERVICAL SPINE 2 OR 3-VIEWS, FL FLUORO UP TO 1 HOUR TECHNIQUE : Intraoperative fluoroscopy was performed without a radiologist present. HISTORY:  Other - Must specify in Comments field FLUOROSCOPY TIME: 18 seconds FINDINGS: Fluoroscopy was performed without a radiologist present.  Spot images were obtained.     IMPRESSION: Intraoperative fluoroscopy was performed. Correlate with procedural note for additional information. Report Verified by: Shelba Flake, MD at 10/19/2021 4:56 PM EDT

## 2021-10-29 NOTE — Unmapped (Signed)
5/10 neck discomfort and stiffness, Tylenol given, See MAR  Problem: Acute Pain  Description: Patient's pain progressing toward patient's stated pain goal  Goal: Patient will manage pain with the appropriate technique/intervention  Description: Assess and monitor patient's pain using appropriate pain scale. Collaborate with interdisciplinary team and initiate plan and interventions as ordered.  Re-assess patient's pain level 30-60 minutes after pain management intervention.  Outcome: Progressing     Problem: Acute Pain  Description: Patient's pain progressing toward patient's stated pain goal  Goal: Patient will reduce or eliminate use of analgesics  Outcome: Progressing

## 2021-10-29 NOTE — Unmapped (Signed)
Problem: Acute Pain  Description: Patient's pain progressing toward patient's stated pain goal  Goal: Patient verbalizes a reduction in pain level  Outcome: Progressing     Problem: Acute Pain  Description: Patient's pain progressing toward patient's stated pain goal  Goal: Patients pain is managed to allow active participation in daily activities  Outcome: Progressing

## 2021-10-29 NOTE — Unmapped (Signed)
HPI:  I had the pleasure of seeing Matthew [name] in my clinic today.  Matthew [name] is a pleasant 76 y.o. male who is status post C3/4 ACDF on 10/19/2021 who presents to the neurosurgery clinic today with concerns for a fluid collection in the neck.  The patient did have a dural tear during the operation that was patched intraoperatively.  He was seen in the ENT clinic earlier this week with a fluid collection in the neck.  He was seen again today by Dr. Diego Cory and the fluid collection was increasing in size with some tracheal deviation and some pharyngeal bulging on scope.  The patient does not have headaches or any worsening neurologic symptoms.  He does not have any drainage of the fluid from his incision.    Allergies:  Allergies   Allergen Reactions    Animal Dander      Causes congestion    Adhesive Tape-Silicones Rash       Medications:  (Not in a hospital admission)    No current facility-administered medications for this visit.     No current outpatient medications on file.     Facility-Administered Medications Ordered in Other Visits   Medication Dose Route Frequency Provider Last Rate Last Admin    acetaminophen (TYLENOL) tablet 650 mg  650 mg Oral Q6H PRN Vickie Epley, MD   650 mg at 10/29/21 1525    bisacodyL (DULCOLAX) suppository 10 mg  10 mg Rectal Daily PRN Vickie Epley, MD        ondansetron [facility]) injection 4 mg  4 mg Intravenous Q6H PRN Vickie Epley, MD        oxyCODONE (ROXICODONE) immediate release tablet 5 mg  5 mg Oral Q4H PRN Vickie Epley, MD        senna-docusate (SENNA-S) 8.6-50 mg per tablet 1 tablet  1 tablet Oral BID Vickie Epley, MD           Past Medical, Surgical, and Family History  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    Environmental allergies     Hearing loss     left (since age 35), right more recent    High cholesterol     Hypertension     Neck symptom     Trouble turning head to the left     Sleep apnea      Sleep disorder      Past Surgical History:   Procedure Laterality Date    ACHILLES TENDON REPAIR  01/31/2002    left     APPENDECTOMY      HERNIA REPAIR  02/01/1951    KNEE SURGERY Left 1976    SPINE SURGERY      TONSILLECTOMY      WISDOM TOOTH EXTRACTION       Family History   Problem Relation Age of Onset    Colon Cancer Mother     Heart failure Father        Past Social History  Social History     Tobacco Use    Smoking status: Never    Smokeless tobacco: Never   Vaping Use    Vaping Use: Never used   Substance Use Topics    Alcohol use: Yes     Alcohol/week: 4.0 standard drinks     Types: 2 Glasses of wine, 2 Cans of beer per week    Drug use: No     Comment:  02/16/10       ROS:    The patient's presenting symptoms and ROS are as noted in the history and physical and also documented in the Epic chart. All other systems are reviewed and found non-contributory.     PHYSICAL EXAM:    Awake, alert and oriented to person, place and time.  CN II-XII grossly intact  Neck demonstrates right-sided oblique incision with a pronounced fluid collection under the skin  CV: Regular rate and rhythm  Lungs: Symmetric chest rise, no increased work of breathing  Abd: Soft, nontender    Neurological Exam    Spine:    No point tenderness along cervical, thoracic, and lumbar spines    Upper Extremity Motor Exam                       R     L  Deltoid                     5     5     Biceps                     5     5  Triceps         5     5  Handgrip         5     5  Finger abduction     5     5    Sensation intact to light touch in C5-T1 dermatomes    Lower Extremity Motor Exam                          R     L  Hip flexion            5     5  Knee extension          5     5  Foot dorsiflexion         5     5  EHL                        5     5  Foot plantarflexion     5     5     Sensation intact to light touch in L2-S1 dermatomes    LABS:  Lab Results   Component Value Date    WBC 11.4 (H) 10/20/2021    HGB 12.8  (L) 10/20/2021    HCT 36.4 (L) 10/20/2021    MCV 93.6 10/20/2021    PLT 136 (L) 10/20/2021     Lab Results   Component Value Date    GLUCOSE 120 (H) 10/20/2021    BUN 22 10/20/2021    CO2 27 10/20/2021    CREATININE 0.86 10/20/2021    K 4.0 10/20/2021    NA 142 10/20/2021    CL 105 10/20/2021    CALCIUM 9.1 10/20/2021     Lab Results   Component Value Date    INR 1.0 10/14/2021     No results found for: SEDRATE  No results found for: CRP    IMAGING:  No results found.    DIAGNOSIS:  Cerebrospinal fluid leak  Status post anterior cervical discectomy and fusion    ASSESSMENT & PLAN:  The patient is a [age] y.o. male status post C3/4 ACDF on 10/19/2021  who presents to the neurosurgery clinic today for evaluation.  The patient had a dural tear during his operation and has had persistent increase in size of the CSF collection in the neck over the past several days.  The patient has had worsening tracheal deviation and pharyngeal compression.  We will directly admit the patient to the hospital.  We will obtain a CT of the neck.  We will plan for OR tomorrow for evacuation of the fluid collection and CSF leak repair.  Risks and benefits of the surgery were described to the patient in detail and he was agreeable to proceed.  We will also plan for lumbar drain placement intraoperatively.    I saw and examined the patient on 10/29/21    I answered all of the patient's questions to the patient's satisfaction and understanding, and the patient is in agreement with the plan.     LOS MDM    Today's body mass index is 23.85 kg/m. BMI is normal.    This note was completely edited, written and reviewed by me and consists of information cut and pasted from the my most recent visit, my smart phrases and other Epic tools. I have personally reviewed all aspects of this note to at least include reviewing this patient's chart and problem list, updating the history, physical exam, lab and procedure results, and assessment and plan as detailed  above and below.  As such this visit note reflects my current evaluation and management for this patient.      Tempie Hoist, MD  10/29/2021, 4:24 PM  [facility]  [facility] N[facility]K [facility] SPINE [facility]  [address], [address]  [place] [address]  Dept: [phone]  Loc: [phone]

## 2021-10-29 NOTE — Unmapped (Signed)
CC: Radiculopathy      History of Present Illness:  Patient is 76 y.o. male s/p C3-C4 ACDF with repair of dural tear on 10/19/21 with Dr Sena Hitchheng. Seen by neurosurg yesterday with increasing anterior neck swelling, felt to be CSF leak vs seroma causing tracheal deviation with no SOB.       His neck is more full today, he feels more imbalance and is sleep upright.    Pain: no  Otalgia: no  Odynophagia: no  Dysphagia: No  Dyspnea: slight with walking  Lumps in neck: No  Hemoptysis: no  Fever: No  Chills: No  Sweats: No  Weight Loss: No      Reports history of chronic cough    Allergies  Animal dander and Adhesive tape-silicones    Medications  Outpatient Encounter Medications as of 10/29/2021   Medication Sig Dispense Refill    acetaminophen (TYLENOL) 325 MG tablet Take 3 tablets (975 mg total) by mouth every 6 hours as needed (Give 1 hour pre-op). 100 tablet 0    ALPHA LIPOIC ACID ORAL Take 1 capsule by mouth daily.      amLODIPine (NORVASC) 5 MG tablet Take 1 tablet (5 mg total) by mouth daily.      azelastine (ASTELIN) 137 mcg (0.1 %) nasal spray Use 2 sprays into each nostril 2 times a day. 30 mL 6    baclofen (LIORESAL) 10 MG tablet Take 1 tablet (10 mg total) by mouth daily as needed (Muscle spasms).      bimatoprost (LUMIGAN) 0.01 % Drop Place 1 drop into both eyes daily.      brimonidine (ALPHAGAN) 0.2 % ophthalmic solution Place 1 drop into the right eye 2 times a day.      cetirizine (ZYRTEC) 10 MG tablet Take 1 tablet (10 mg total) by mouth daily. 30 tablet 11    doxepin (SINEQUAN) 10 MG capsule Take 1 capsule (10 mg total) by mouth at bedtime.      finasteride (PROSCAR) 5 mg tablet Take 0.5 mg by mouth daily.      gabapentin (NEURONTIN) 600 MG tablet Take 1 tablet (600 mg total) by mouth 3 times a day. 270 tablet 1    naloxone (NARCAN) 4 mg/actuation Spry Apply 1 spray in one nostril if needed. Call 911. May repeat dose in other nostril if no response in 3 minutes. 2 each 1    [EXPIRED] oxyCODONE (ROXICODONE) 5  MG immediate release tablet Take 1 tablet (5 mg total) by mouth every 4 hours as needed for up to 20 doses. 20 tablet 0    rosuvastatin (CRESTOR) 10 MG tablet Take 1 tablet (10 mg total) by mouth every evening. 30 tablet 5    senna-docusate (SENNA-S) 8.6-50 mg per tablet Take 1 tablet by mouth 2 times a day. 30 tablet 0    tadalafiL (CIALIS) 20 MG tablet Take 1 tablet (20 mg total) by mouth daily as needed for Erectile Dysfunction.      timolol (TIMOPTIC) 0.5 % ophthalmic solution Place 1 drop into both eyes every 12 hours.       No facility-administered encounter medications on file as of 10/29/2021.        Review of Systems:  * See scanned Review of Systems sheet.    Vitals  There were no vitals taken for this visit.    PHYSICAL EXAM   General:     General Appearance: well-developed, well-nourished and in no acute distress    Communication: I was able  to converse well with the patient. Patient was able to answer questions adequately and appropriately.    Head & Face (general): normal appearance    Face (palpation): no sinus tenderness    Salivary Glands (palpation): salivary glands NL size.    Voice Quality: Normal  Eyes:     External: anicteric sclera, conjunctiva not injected, lids no lesions or swelling    Extraoccular Muscle: intact  Nose:     External: external nose without infection or abnormality.    Internal: anterior rhinoscopy performed. Turbinates non-erythematous, non-swollen, no pus, septum midline, mucosa intact. No masses, polyps or pus. No septal perforation.  Ears:     External Ears: external ears are of normal appearance. No masses, lesions or scars.  House-Brackman Grading System for Facial Nerve Dysfunction:     (Left) Grade 1: Normal movement    (Right) Grade 1: Normal movement  Mouth:     Lip/teeth/gums: healthy dentition, lips, teeth and gums in good condition. no gingival inflammation, no labial lesions. Mucosa: No leukoplakia or masses. Hard/soft palates and tongue of NL symmetry.     Tongue: normal midline, normal mucosa    Oropharynx/ Tonsils: pharyngeal walls and tonsillar fossae without abnormalities.    Neck:     Right neck fullness - soft  - perhaps more than few days ago  Lymphatic:     No cervical, supraclavicular or auricular adenopathy  Salivary glands NL size.    Thyroid Exam: no nodules, masses, tenderness or enlargement of thyroid on palpation.  Respiratory Inspection:     Respiratory Effort: breathing comfortably, no increased work of breathing.  Cardiovascular:     Carotid arteries: pulses 2+, symmetric, no bruits  Skin:     Inspection: no lower extremity edema, rashes, lesions, or ulcerations, well developed, turgor intact    Palpation: no subcutaneous nodules or induration  Musculoskeletal:     Gait and Station: intact without difficulty  Neurologic:     Cranial Nerves: II - XII grossly intact  Mental Status:     Orientation: oriented to time, place, and person    Mood and affect: NL mood and affect. A+O x 4.        Flexible Laryngoscopy without biopsy  Anesthesia: Topical 1% Xylocaine with Afrin  Estimated Blood Loss: None    Procedure:   After obtaining consent, the patient was placed in the examination chair in the upright position.  Decongestant and topical anesthetic was sprayed in the right and left nostrils.  After allowing adequate time for hemostatic effect, the flexible 4 mm laryngoscope was passed via the right and left nostrils.      Nasal Septum: normal;   Nasal Findings: normal;   Nasopharynx: normal   Eustachian Tube: normal   Oropharynx: normal   Base of Tongue: normal   Epiglottis: normal   True Vocal Cord normal   False Vocal Cord: normal   True Vocal Cord Movement: normal   Hypopharynx Mucosa: normal    Exam Completed - 10/31/2021     * Patient tolerated the procedure well with no complications   * Patient was instructed not to eat for 30 minutes following procedure.   * Patient was instructed that they may notice minor bleeding.      There is decreased space  between post ph wall and BOT    Lab Review:   Lab Results   Component Value Date    WBC 11.4 (H) 10/20/2021    HGB 12.8 (L) 10/20/2021  HCT 36.4 (L) 10/20/2021    MCV 93.6 10/20/2021    PLT 136 (L) 10/20/2021    CREATININE 0.86 10/20/2021    BUN 22 10/20/2021    NA 142 10/20/2021    K 4.0 10/20/2021    CL 105 10/20/2021    CO2 27 10/20/2021    ALT 23 10/19/2021    AST 23 10/19/2021    ALKPHOS 73 10/19/2021    BILITOT 0.7 10/19/2021            Assessment and Plan:    76 y.o. male s/p ACDF and repair of dural tear with anterior neck swelling. This does not appear to be blood or hematoma because it is so soft. US guided drainage would create potential for infection and would not drain deeper portion of collection    Slightly worse hematoma versus CSF leak    Will plan to admit and obs versus neck exploration with nsx input.     Portions of the note for this encounter was transcribed by Brunilda Payor, RN acting as a scribe when documenting those areas.  I personally performed the history, physical exam, procedure and medical decision making.  The documentation recorded by the scribe accurately reflects the services I performed and the decisions made by me  This progress note was copied forward from the note written by Kallie Locks MD from my last clinic/office note. I have reviewed and updated the history, physical exam, data, assessment and plan of the note so that it reflects the evaluation and management of the patient on 10/31/21.

## 2021-10-29 NOTE — Unmapped (Signed)
IV placed in LAC, CMU placed and verified with CMU. Patient given gown and socks. Awaiting CT

## 2021-10-29 NOTE — Unmapped (Signed)
ENT Update Note  Patient to go to OR tomorrow with ENT. Please make patient NPO at midnight.  Please hold subcutaneous heparin at midnight. Hold any therapeutic anticoagulation per plan discussed prior.     If there are any respiratory concerns this evening please page ENT.     Francine Graven, MD  Otolaryngology  Pager: [phone]  10/29/2021

## 2021-10-29 NOTE — Unmapped (Signed)
Patient arrived to 5E via ambulation with wife. Awaiting orders at this time.

## 2021-10-30 ENCOUNTER — Inpatient Hospital Stay: Admit: 2021-10-30 | Payer: MEDICARE

## 2021-10-30 DIAGNOSIS — G9609 Other spinal cerebrospinal fluid leak: Secondary | ICD-10-CM

## 2021-10-30 LAB — CBC
Hematocrit: 38.8 % (ref 38.5–50.0)
Hemoglobin: 13.3 g/dL (ref 13.2–17.1)
MCH: 32.1 pg (ref 27.0–33.0)
MCHC: 34.2 g/dL (ref 32.0–36.0)
MCV: 93.9 fL (ref 80.0–100.0)
MPV: 8.1 fL (ref 7.5–11.5)
Platelets: 175 10*3/uL (ref 140–400)
RBC: 4.13 10*6/uL (ref 4.20–5.80)
RDW: 13 % (ref 11.0–15.0)
WBC: 6.1 10*3/uL (ref 3.8–10.8)

## 2021-10-30 LAB — FUNGUS CULTURE

## 2021-10-30 LAB — PLATELET FUNCTION ASPIRIN: Platelet Function Aspirin: 642 {ARU}

## 2021-10-30 LAB — BASIC METABOLIC PANEL
Anion Gap: 7 mmol/L (ref 3–16)
BUN: 29 mg/dL (ref 7–25)
CO2: 32 mmol/L (ref 21–33)
Calcium: 9.4 mg/dL (ref 8.6–10.3)
Chloride: 105 mmol/L (ref 98–110)
Creatinine: 0.97 mg/dL (ref 0.60–1.30)
EGFR: 81
Glucose: 113 mg/dL (ref 70–100)
Osmolality, Calculated: 305 mOsm/kg (ref 278–305)
Potassium: 3.8 mmol/L (ref 3.5–5.3)
Sodium: 144 mmol/L (ref 133–146)

## 2021-10-30 LAB — ROUTINE CULTURE PLUS STAIN
Culture Result: NO GROWTH
Gram Stain Result: NONE SEEN

## 2021-10-30 LAB — RAPID TEG
TEG ACT: 97 seconds (ref 86.0–118.0)
TEG Angle: 80.1 degrees (ref 64–80)
TEG Lysis 30: 1.3 % (ref 0.0–3.0)
TEG Max Amplitude: 68.6 mm (ref 52–71)
TEG R Time: 30 seconds (ref 22–44)
TEG Time: 50 seconds (ref 34–138)

## 2021-10-30 LAB — POC GLU MONITORING DEVICE: POC Glucose Monitoring Device: 134 mg/dL (ref 70–100)

## 2021-10-30 LAB — ANAEROBIC CULTURE

## 2021-10-30 LAB — ANTI-XA LMW HEPARIN: Anti-Xa LMW Heparin: <0.1 units/mL (ref 0.50–1.10)

## 2021-10-30 LAB — PLATELET FUNCTION P2Y12: Platelet Function P2Y12: 291 [PRU]

## 2021-10-30 MED ORDER — fentaNYL (SUBLIMAZE) injection
50 | INTRAMUSCULAR | PRN
Start: 2021-10-30 — End: 2021-10-30
  Administered 2021-10-30: 13:00:00 25 via INTRAVENOUS
  Administered 2021-10-30: 14:00:00 50 via INTRAVENOUS
  Administered 2021-10-30: 14:00:00 25 via INTRAVENOUS

## 2021-10-30 MED ORDER — sugammadex (BRIDION) IV solution
100 | INTRAVENOUS | PRN
Start: 2021-10-30 — End: 2021-10-30
  Administered 2021-10-30: 14:00:00 200 via INTRAVENOUS

## 2021-10-30 MED ORDER — dextrose 10%-water (D10W) IV soln
INTRAVENOUS | PRN
Start: 2021-10-30 — End: 2021-10-30

## 2021-10-30 MED ORDER — fentaNYL (SUBLIMAZE) injection 12.5 mcg
50 | INTRAMUSCULAR | PRN
Start: 2021-10-30 — End: 2021-10-30

## 2021-10-30 MED ORDER — thrombin (recombinant) (RECOTHROM) 5,000 unit topical soln SolR
5000 | TOPICAL | Status: AC
Start: 2021-10-30 — End: ?

## 2021-10-30 MED ORDER — doxepin (SINEQUAN) capsule 10 mg
10 | Freq: Every evening | ORAL
Start: 2021-10-30 — End: 2021-11-09
  Administered 2021-10-31 – 2021-11-09 (×9): 10 mg via ORAL

## 2021-10-30 MED ORDER — thrombin (bovine) topical solution
5000 | TOPICAL | PRN
Start: 2021-10-30 — End: 2021-10-30
  Administered 2021-10-30: 13:00:00 5000 via TOPICAL

## 2021-10-30 MED ORDER — oxyCODONE (ROXICODONE) immediate release tablet 5 mg
5 | Freq: Once | ORAL | Status: AC
Start: 2021-10-30 — End: 2021-10-30
  Administered 2021-10-30: 17:00:00 5 mg via ORAL

## 2021-10-30 MED ORDER — naloxone (NARCAN) injection 0.04 mg
0.4 | INTRAMUSCULAR | PRN
Start: 2021-10-30 — End: 2021-10-30

## 2021-10-30 MED ORDER — rocuronium (ZEMURON) 10 mg/mL injection
10 | INTRAVENOUS | Status: AC
Start: 2021-10-30 — End: ?

## 2021-10-30 MED ORDER — dexamethasone (DECADRON) injection
4 | INTRAMUSCULAR | PRN
Start: 2021-10-30 — End: 2021-10-30
  Administered 2021-10-30: 12:00:00 8 via INTRAVENOUS

## 2021-10-30 MED ORDER — HYDROmorphone (DILAUDID) injection Syrg 0.2 mg
0.5 | INTRAMUSCULAR | PRN
Start: 2021-10-30 — End: 2021-10-30

## 2021-10-30 MED ORDER — fentaNYL (SUBLIMAZE) 50 mcg/mL injection
50 | INTRAMUSCULAR | Status: AC
Start: 2021-10-30 — End: ?

## 2021-10-30 MED ORDER — oxyCODONE (ROXICODONE) immediate release tablet 2.5 mg
5 | ORAL | PRN
Start: 2021-10-30 — End: 2021-10-30

## 2021-10-30 MED ORDER — lidocaine (PF) 20 mg/mL (2 %) Soln
20 | INTRAVENOUS | PRN
Start: 2021-10-30 — End: 2021-10-30
  Administered 2021-10-30: 12:00:00 100 via INTRAVENOUS

## 2021-10-30 MED ORDER — lactated Ringers infusion
INTRAVENOUS | PRN
Start: 2021-10-30 — End: 2021-10-30
  Administered 2021-10-30 (×2): via INTRAVENOUS

## 2021-10-30 MED ORDER — amLODIPine (NORVASC) tablet 5 mg
5 | Freq: Every day | ORAL
Start: 2021-10-30 — End: 2021-11-09
  Administered 2021-10-30 – 2021-11-09 (×10): 5 mg via ORAL

## 2021-10-30 MED ORDER — baclofen (LIORESAL) tablet 10 mg
10 | Freq: Every day | ORAL | PRN
Start: 2021-10-30 — End: 2021-11-05
  Administered 2021-10-31 – 2021-11-05 (×4): 10 mg via ORAL

## 2021-10-30 MED ORDER — ceFAZolin (ANCEF) injection
1 | INTRAMUSCULAR | PRN
Start: 2021-10-30 — End: 2021-10-30
  Administered 2021-10-30: 12:00:00 2 via INTRAVENOUS

## 2021-10-30 MED ORDER — gabapentin (NEURONTIN) capsule 600 mg
300 | Freq: Every evening | ORAL
Start: 2021-10-30 — End: 2021-10-30

## 2021-10-30 MED ORDER — acetaminophen (OFIRMEV) Soln
1000 | INTRAVENOUS | PRN
Start: 2021-10-30 — End: 2021-10-30
  Administered 2021-10-30: 13:00:00 1000 via INTRAVENOUS

## 2021-10-30 MED ORDER — ondansetron (ZOFRAN) injection
4 | INTRAMUSCULAR | PRN
Start: 2021-10-30 — End: 2021-10-30
  Administered 2021-10-30: 14:00:00 4 via INTRAVENOUS

## 2021-10-30 MED ORDER — oxyCODONE (ROXICODONE) immediate release tablet 5 mg
5 | ORAL | PRN
Start: 2021-10-30 — End: 2021-10-30

## 2021-10-30 MED ORDER — senna-docusate (SENNA-S) 8.6-50 mg per tablet 1 tablet
8.6-50 | Freq: Two times a day (BID) | ORAL
Start: 2021-10-30 — End: 2021-10-30
  Administered 2021-10-31: 1 via ORAL

## 2021-10-30 MED ORDER — finasteride (PROSCAR) tablet 5 mg
5 | Freq: Every day | ORAL
Start: 2021-10-30 — End: 2021-11-09
  Administered 2021-10-31 – 2021-11-09 (×6): 5 mg via ORAL

## 2021-10-30 MED ORDER — phenylephrine (NEO-SYNEPHRINE) injection
10 | INTRAMUSCULAR | PRN
Start: 2021-10-30 — End: 2021-10-30
  Administered 2021-10-30 (×3): 100 via INTRAVENOUS

## 2021-10-30 MED ORDER — gabapentin (NEURONTIN) capsule 600 mg
300 | Freq: Three times a day (TID) | ORAL
Start: 2021-10-30 — End: 2021-11-05
  Administered 2021-10-30 – 2021-11-05 (×15): 600 mg via ORAL

## 2021-10-30 MED ORDER — ondansetron (ZOFRAN) injection 4 mg
4 | Freq: Three times a day (TID) | INTRAMUSCULAR | PRN
Start: 2021-10-30 — End: 2021-10-30

## 2021-10-30 MED ORDER — sugammadex (BRIDION) 100 mg/mL IV solution
100 | INTRAVENOUS | Status: AC
Start: 2021-10-30 — End: ?

## 2021-10-30 MED ORDER — sodium chloride 0.9 % irrigation
0.9 | PRN
Start: 2021-10-30 — End: 2021-10-30
  Administered 2021-10-30: 13:00:00 1000

## 2021-10-30 MED ORDER — latanoprost (XALATAN) 0.005 % ophthalmic solution 1 drop
0.005 | Freq: Every evening | OPHTHALMIC
Start: 2021-10-30 — End: 2021-11-09
  Administered 2021-10-31 – 2021-11-09 (×10): 1 [drp] via OPHTHALMIC

## 2021-10-30 MED ORDER — atorvastatin (LIPITOR) tablet 40 mg
20 | Freq: Every evening | ORAL
Start: 2021-10-30 — End: 2021-11-09
  Administered 2021-10-31 – 2021-11-09 (×9): 40 mg via ORAL

## 2021-10-30 MED ORDER — cetirizine (ZYRTEC) tablet 10 mg
10 | Freq: Every day | ORAL
Start: 2021-10-30 — End: 2021-11-09
  Administered 2021-10-31 – 2021-11-09 (×9): 10 mg via ORAL

## 2021-10-30 MED ORDER — timolol (TIMOPTIC) 0.5 % ophthalmic solution 1 drop
0.5 | Freq: Two times a day (BID) | OPHTHALMIC
Start: 2021-10-30 — End: 2021-11-09
  Administered 2021-10-31 – 2021-11-09 (×19): 1 [drp] via OPHTHALMIC

## 2021-10-30 MED ORDER — brimonidine (ALPHAGAN) 0.2 % ophthalmic solution 1 drop
0.2 | Freq: Two times a day (BID) | OPHTHALMIC
Start: 2021-10-30 — End: 2021-11-09
  Administered 2021-10-31 – 2021-11-09 (×18): 1 [drp] via OPHTHALMIC

## 2021-10-30 MED ORDER — HYDROmorphone (DILAUDID) injection Syrg 0.5 mg
0.5 | INTRAMUSCULAR | PRN
Start: 2021-10-30 — End: 2021-10-30

## 2021-10-30 MED ORDER — ceFAZolin (ANCEF) 1 g in sodium chloride 0.9% 50 mL ADDaptor IVPB
1 | Freq: Three times a day (TID) | INTRAMUSCULAR
Start: 2021-10-30 — End: 2021-11-01
  Administered 2021-10-30 – 2021-11-01 (×7): 1 g via INTRAVENOUS

## 2021-10-30 MED ORDER — rocuronium (ZEMURON) injection
10 | INTRAVENOUS | PRN
Start: 2021-10-30 — End: 2021-10-30
  Administered 2021-10-30 (×2): 20 via INTRAVENOUS
  Administered 2021-10-30: 13:00:00 30 via INTRAVENOUS
  Administered 2021-10-30: 12:00:00 50 via INTRAVENOUS

## 2021-10-30 MED ORDER — methocarbamoL (ROBAXIN) 100 mg/mL injection
100 | INTRAMUSCULAR | Status: AC
Start: 2021-10-30 — End: ?

## 2021-10-30 MED ORDER — methocarbamoL (ROBAXIN) injection
100 | INTRAMUSCULAR | PRN
Start: 2021-10-30 — End: 2021-10-30
  Administered 2021-10-30: 14:00:00 500 via INTRAVENOUS

## 2021-10-30 MED ORDER — fentaNYL (SUBLIMAZE) injection 25 mcg
50 | INTRAMUSCULAR | PRN
Start: 2021-10-30 — End: 2021-10-30

## 2021-10-30 MED ORDER — glucose chewable tablet 12 g
4 | ORAL | PRN
Start: 2021-10-30 — End: 2021-10-30

## 2021-10-30 MED ORDER — propofol 10 mg/ml (DIPRIVAN) injection
10 | INTRAVENOUS | PRN
Start: 2021-10-30 — End: 2021-10-30
  Administered 2021-10-30: 13:00:00 30 via INTRAVENOUS
  Administered 2021-10-30: 12:00:00 150 via INTRAVENOUS

## 2021-10-30 MED ORDER — acetaminophen (TYLENOL) tablet 650 mg
325 | Freq: Four times a day (QID) | ORAL
Start: 2021-10-30 — End: 2021-11-09
  Administered 2021-10-30 – 2021-11-09 (×30): 650 mg via ORAL

## 2021-10-30 MED FILL — DOXEPIN 10 MG CAPSULE: 10 10 MG | ORAL | Qty: 1

## 2021-10-30 MED FILL — OXYCODONE 5 MG TABLET: 5 5 MG | ORAL | Qty: 1

## 2021-10-30 MED FILL — CEFAZOLIN 1 GRAM SOLUTION FOR INJECTION: 1 1 g | INTRAMUSCULAR | Qty: 1

## 2021-10-30 MED FILL — FENTANYL (PF) 50 MCG/ML INJECTION SOLUTION: 50 50 mcg/mL | INTRAMUSCULAR | Qty: 2

## 2021-10-30 MED FILL — ROBAXIN 100 MG/ML INJECTION SOLUTION: 100 100 mg/mL | INTRAMUSCULAR | Qty: 10

## 2021-10-30 MED FILL — BRIDION 100 MG/ML INTRAVENOUS SOLUTION: 100 100 mg/mL | INTRAVENOUS | Qty: 2

## 2021-10-30 MED FILL — RECOTHROM 5,000 UNIT TOPICAL SOLUTION: 5000 5,000 unit | TOPICAL | Qty: 2

## 2021-10-30 MED FILL — ROCURONIUM 10 MG/ML INTRAVENOUS SOLUTION: 10 10 mg/mL | INTRAVENOUS | Qty: 5

## 2021-10-30 MED FILL — TYLENOL 325 MG TABLET: 325 325 mg | ORAL | Qty: 2

## 2021-10-30 MED FILL — GABAPENTIN 300 MG CAPSULE: 300 300 MG | ORAL | Qty: 2

## 2021-10-30 MED FILL — AMLODIPINE 5 MG TABLET: 5 5 MG | ORAL | Qty: 1

## 2021-10-30 MED FILL — TIMOLOL MALEATE 0.5 % EYE DROPS: 0.5 0.5 % | OPHTHALMIC | Qty: 5

## 2021-10-30 MED FILL — BRIMONIDINE 0.2 % EYE DROPS: 0.2 0.2 % | OPHTHALMIC | Qty: 5

## 2021-10-30 MED FILL — LATANOPROST 0.005 % EYE DROPS: 0.005 0.005 % | OPHTHALMIC | Qty: 2.5

## 2021-10-30 MED FILL — ONDANSETRON HCL (PF) 4 MG/2 ML INJECTION SOLUTION: 4 4 mg/2 mL | INTRAMUSCULAR | Qty: 2

## 2021-10-30 MED FILL — STIMULANT LAXATIVE PLUS 8.6 MG-50 MG TABLET: 8.6-50 8.6-50 mg | ORAL | Qty: 1

## 2021-10-30 MED FILL — FINASTERIDE 5 MG TABLET: 5 5 mg | ORAL | Qty: 1

## 2021-10-30 NOTE — Unmapped (Signed)
Patient belongings delivered to patient bedside by Clinical research associate. Multiple attempts to call wife to update. Unsuccessful. Bedside report given to 4W stepdown RN.

## 2021-10-30 NOTE — Unmapped (Signed)
[facility]  DIVISION OF OTOLARYNGOLOGY- HEAD & NECK SURGERY  GENERAL INPATIENT PROGRESS NOTE      ASSESSMENT  Matthew [name] is a 76 y.o. male s/p C3-4 ACDF on 9/19 complicated by intraoperative CSF leak who presents with 2 days of progressive right neck swelling concerning for continued CSF leak. Pre-operative transoral exam notable for prominent bulge of posterior pharyngeal wall. He is breathing comfortably on room air and in no distress. Patient is s/p CSF leak repair and neck exploration on 9/30.     PLAN  - Admitted to NSGY  - No acute ENT intervention   - Routine incision care  - Page ENT stat if any concerns for impending respiratory distress    SUBJECTIVE  Patient breathing comfortably on RA. Neck flat with dressing in place.     PHYSICAL EXAM  BP 122/66   Pulse 78   Temp 97.5 F (36.4 C) (Oral)   Resp 14   Ht 5' 11 (1.803 m)   Wt 175 lb (79.4 kg)   SpO2 94%   BMI 24.41 kg/m     GENERAL: No Acute Distress, Alert and Oriented  EYES: EOMI, Anti-icteric  NOSE: No epistaxis  EARS: Normal external canal appearance  FACE: 1/6 House-Brackmann Scale  NECK: right neck incision intact, flat  CHEST: Normal respiratory effort, no retractions. No stridor    LABORATORY  Past 24 hour labs:   Lab Results   Component Value Date    WBC 6.1 10/30/2021    HGB 13.3 10/30/2021    HCT 38.8 10/30/2021    MCV 93.9 10/30/2021    PLT 175 10/30/2021     Lab Results   Component Value Date    GLUCOSE 113 (H) 10/30/2021    BUN 29 (H) 10/30/2021    CREATININE 0.97 10/30/2021    K 3.8 10/30/2021    NA 144 10/30/2021    CL 105 10/30/2021    CALCIUM 9.4 10/30/2021     Lab Results   Component Value Date    MG 2.2 10/19/2021     Lab Results   Component Value Date    PHOS 2.7 10/20/2021     Lab Results   Component Value Date    ALKPHOS 73 10/19/2021    ALT 23 10/19/2021    AST 23 10/19/2021    BILITOT 0.7 10/19/2021    ALBUMIN 3.8 10/20/2021    PROT 7.1 10/19/2021     No results found for: LDH  No results  found for: PTT  No results found for: AMYLASE  No results found for: LIPASE    RADIOLOGY  X-ray Cervical Spine 2 or 3-views    Result Date: 10/20/2021  EXAM: XR CERVICAL SPINE 2 OR 3-VIEWS  INDICATION:  Other - Must specify in Comments field,  C3-4 ACDF TECHNIQUE:  XR CERVICAL SPINE 2 OR 3-VIEWS COMPARISON: Cervical spine fluoroscopy on 10/19/2021, MRI cervical spine on 08/17/2021. FINDINGS: Postsurgical changes from interbody disc spacer at C3-4 without evidence of hardware fracture or loosening. Anterior neck surgical drain at the level of C4 on the right. Cervical vertebral bodies are normal alignment. C6 and C7 vertebral bodies are partially obscured by soft tissue. Vertebral body heights are maintained. Mild lower cervical degenerative disc disease. Posterior elements are intact. No prevertebral soft tissue swelling.     IMPRESSION: 1.  Interbody disc spacer at C3-4 without hardware complication. 2.  Mild lower cervical degenerative disc disease. Approved by Carmelia Bake, DO on 10/20/2021 1:36 PM  EDT I have personally reviewed the images and I agree with this report. Report Verified by: Shelba Flake, MD at 10/20/2021 1:57 PM EDT    X-ray Cervical Spine 2 or 3-views    Result Date: 10/19/2021    XR CERVICAL SPINE 2 OR 3-VIEWS, FL FLUORO UP TO 1 HOUR TECHNIQUE : Intraoperative fluoroscopy was performed without a radiologist present. HISTORY:  Other - Must specify in Comments field FLUOROSCOPY TIME: 18 seconds FINDINGS: Fluoroscopy was performed without a radiologist present.  Spot images were obtained.     IMPRESSION: Intraoperative fluoroscopy was performed. Correlate with procedural note for additional information. Report Verified by: Shelba Flake, MD at 10/19/2021 4:56 PM EDT    CT Neck WO contrast    Result Date: 10/29/2021  EXAM: CT NECK WO CONTRAST INDICATION: operative planning. TECHNIQUE: Axial thin section CT images of the neck were obtained without contrast . Sagittal and coronal 2D multiplanar  reconstructions were performed at the scanner.  COMPARISON: None available. FINDINGS: The diagnostic quality of the examination is adequate. Evaluation is limited in the absence of intravenous contrast. Nasopharynx: Symmetric with no evidence of mass. Normal torus tubarius, fossa of Rosenmuller, and adenoid tonsillar tissue. Suprahyoid neck: Streak artifact from dental amalgam and lack of intravenous contrast limits evaluation of the oral cavity. There is a multilobulated fluid collection emanating from the retropharynx at the level of C3 extending rightward into the right neck inferiorly along the sternocleidomastoid muscle. The collection measures fluid density. Nongeometric shape makes accurate measurements difficult though approximately measures 3.1 x 7.1 x 10 cm. Inferiorly the fluid tracks along the prevertebral soft tissues to the level of C6. Of note, right internal carotid arteries immediately posterior to the lateral component of the fluid collection. Infrahyoid neck:An open glottis is imaged. Normal larynx, hypopharynx, and supraglottis. Lymph nodes: No abnormal lymph nodes are identified. Salivary Glands: The bilateral parotid, submandibular, and sublingual glands demonstrate normal attenuation. No dilated ductal system or calculus confirmed. Thyroid: Normal. Brain: The visualized portions of the brain appear normal. There are atherosclerotic vessel calcifications at the skull base. Orbits, paranasal sinuses, and skull base: No abnormalities of the orbits are identified. The paranasal sinuses are clear. The mastoid air cells are clear. Vascular structures: Minimal calcified plaques are present involving the carotid bifurcations. While not adequately assessed without contrast, no definite stenosis is suspected. Thoracic inlet: The included lung apices demonstrate no suspicious nodule or other significant abnormal finding. Bones: Postsurgical changes related to C3-C4 ACDF with interbody spacer. Flowing  bulky ventral osteophytes span C2-T2 with bulky ventral ankylosis. There is disruption of the ankylosis at the surgical site, likely posttraumatic/postsurgical. Posterior vertebral body osteophytes with bulky disc osteophyte complex/ossification of the posterior longitudinal ligament at C3-C4 at least moderately narrows the spinal canal with suspected spinal cord compression. Multilevel uncovertebral hypertrophy contributes to multilevel foraminal stenosis, worst at C3-C4 on the right where there is severe foraminal stenosis..     IMPRESSION: 1.  Nonspecific large multilobulated prevertebral/right neck fluid collection emanating from the surgical site at C3-C4. This could represent a postoperative seroma/serosanguineous collection though CSF leak and collection is a consideration given the reported intraoperative CSF leak. 2.  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 Report Verified by: Ezzie Dural, DO at 10/29/2021 9:47 PM EDT    Fluoro up to 1 hour    Result Date: 10/19/2021    XR CERVICAL SPINE 2 OR 3-VIEWS, FL FLUORO UP TO 1 HOUR TECHNIQUE : Intraoperative fluoroscopy was performed without a radiologist present. HISTORY:  Other - Must specify in Comments field FLUOROSCOPY TIME: 18 seconds FINDINGS: Fluoroscopy was performed without a radiologist present.  Spot images were obtained.     IMPRESSION: Intraoperative fluoroscopy was performed. Correlate with procedural note for additional information. Report Verified by: Shelba Flake, MD at 10/19/2021 4:56 PM EDT

## 2021-10-30 NOTE — Unmapped (Signed)
[name]  DEPARTMENT OF ANESTHESIOLOGY  PRE-PROCEDURAL EVALUATION    Matthew [name] is a 76 y.o. year old male presenting for:    Procedure(s):  CSF leak repair, exploration of the neck  possible lumbar drain    Surgeon:   Tempie Hoist, MD    Chief Complaint     Wash-out (Spine)    Review of Systems     Anesthesia Evaluation    Patient summary reviewed, nursing notes reviewed and Previous anesthesia note reviewed.       No history of anesthetic complications   I have reviewed the History and Physical Exam, any relevant changes are noted in the anesthesia pre-operative evaluation.      Cardiovascular:    Exercise tolerance: good  Duke Met score: 5 - Walking four miles per hour. Social dancing. Washing a car.  (+) hyperlipidemia (on crestor).    Hypertension (takes amlodipine) is well controlled.      (-) dysrhythmias, angina, CHF.    Neuro/Muscoloskeletal/Psych:    (+) neuromuscular disease (esophageal dysphagia, cervical myelopathy).      (-) seizures, TIA, back problems, no anxiety, no depression.     Pulmonary:          Sleep apnea (does not use CPAP).    (-) no pneumonia, asthma, shortness of breath, recent URI.       GI/Hepatic/Renal:            (-) GERD, liver disease, renal disease.    Endo/Other:          (-) diabetes mellitus, hypothyroidism, no anemia, no DVT, no steroid use.     Comments:   CT coronary calcium scoring 09/03/2021  IMPRESSION:   1. Total calcium score of 483, implying extensive atherosclerotic plaque. There is a high likelihood of at least one significant coronary narrowing.      MRI c-spine 07/14/2021  IMPRESSION:   1. 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 Severe left C4 foraminal stenosis.   2.  Degenerative changes other levels without additional cord compression. Mild to moderate foraminal narrowing as described.    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    Environmental allergies     Hearing loss     left (since age 5), right more recent    High cholesterol     Hypertension     Neck symptom     Trouble turning head to the left     Sleep apnea     Sleep disorder        Past Surgical History     Past Surgical History:   Procedure Laterality Date    ACHILLES TENDON REPAIR  01/31/2002    left     APPENDECTOMY      HERNIA REPAIR  02/01/1951    KNEE SURGERY Left 1976    SPINE SURGERY      TONSILLECTOMY      WISDOM TOOTH EXTRACTION         Family History     Family History   Problem Relation Age of Onset    Colon Cancer Mother     Heart failure Father        Social History     Social History     Socioeconomic History  Marital status: Married     Spouse name: Not on file    Number of children: Not on file    Years of education: Not on file    Highest education level: Not on file   Occupational History    Not on file   Tobacco Use    Smoking status: Never    Smokeless tobacco: Never   Vaping Use    Vaping Use: Never used   Substance and Sexual Activity    Alcohol use: Yes     Alcohol/week: 4.0 standard drinks     Types: 2 Glasses of wine, 2 Cans of beer per week    Drug use: No     Comment: 02/16/10    Sexual activity: Not on file   Other Topics Concern    Caffeine Use Yes    Occupational Exposure Not Asked    Exercise Yes    Seat Belt Yes   Social History Narrative    Not on file     Social Determinants of Health     Financial Resource Strain: Not on file   Physical Activity: Not on file   Stress: Not on file   Social Connections: Not on file   Housing Stability: Not on file       Medications     Allergies:  Allergies   Allergen Reactions    Animal Dander      Causes congestion    Adhesive Tape-Silicones Rash       Home Meds:  Prior to Admission medications as of 10/29/21 2057   Medication Sig Taking?   amLODIPine (NORVASC) 5 MG tablet Take 1 tablet (5 mg total) by mouth daily. Yes   bimatoprost (LUMIGAN) 0.01 % Drop Place 1  drop into both eyes daily. Yes   brimonidine (ALPHAGAN) 0.2 % ophthalmic solution Place 1 drop into the right eye 2 times a day. Yes   gabapentin (NEURONTIN) 600 MG tablet Take 1 tablet (600 mg total) by mouth 3 times a day.  Patient taking differently: Take 1 tablet (600 mg total) by mouth at bedtime. Yes   rosuvastatin (CRESTOR) 10 MG tablet Take 1 tablet (10 mg total) by mouth every evening. Yes   timolol (TIMOPTIC) 0.5 % ophthalmic solution Place 1 drop into both eyes every 12 hours. Yes   acetaminophen (TYLENOL) 325 MG tablet Take 3 tablets (975 mg total) by mouth every 6 hours as needed (Give 1 hour pre-op).    ALPHA LIPOIC ACID ORAL Take 1 capsule by mouth daily.    azelastine (ASTELIN) 137 mcg (0.1 %) nasal spray Use 2 sprays into each nostril 2 times a day.    baclofen (LIORESAL) 10 MG tablet Take 1 tablet (10 mg total) by mouth daily as needed (Muscle spasms).    cetirizine (ZYRTEC) 10 MG tablet Take 1 tablet (10 mg total) by mouth daily.    doxepin (SINEQUAN) 10 MG capsule Take 1 capsule (10 mg total) by mouth at bedtime.    finasteride (PROSCAR) 5 mg tablet Take 0.5 mg by mouth daily.    naloxone (NARCAN) 4 mg/actuation Spry Apply 1 spray in one nostril if needed. Call 911. May repeat dose in other nostril if no response in 3 minutes.    senna-docusate (SENNA-S) 8.6-50 mg per tablet Take 1 tablet by mouth 2 times a day.    tadalafiL (CIALIS) 20 MG tablet Take 1 tablet (20 mg total) by mouth daily as needed for Erectile Dysfunction.  Inpatient Meds:  Scheduled:    gabapentin  600 mg Oral Nightly (2100)    senna-docusate  1 tablet Oral BID     Continuous:     PRN: acetaminophen, bisacodyL, ondansetron, oxyCODONE    Vital Signs     Wt Readings from Last 3 Encounters:   10/30/21 175 lb (79.4 kg)   10/29/21 171 lb (77.6 kg)   10/29/21 171 lb (77.6 kg)     Ht Readings from Last 3 Encounters:   10/30/21 5' 11 (1.803 m)   10/29/21 5' 11 (1.803 m)   10/29/21 5' 11 (1.803 m)     Temp Readings from Last 3  Encounters:   10/30/21 98.8 F (37.1 C) (Temporal)   10/29/21 97.5 F (36.4 C) (Temporal)   10/27/21 97.8 F (36.6 C) (Temporal)     BP Readings from Last 3 Encounters:   10/30/21 135/89   10/29/21 128/81   10/29/21 137/76     Pulse Readings from Last 3 Encounters:   10/30/21 74   10/29/21 60   10/29/21 86     @LASTSAO2 (3)@    Physical Exam     Airway:     Mallampati: II  TM distance: > = 3 FB  Neck ROM: limited  (-) no facial hair, neck not short, not intubated      Dental:   - No obvious cracked, loose, chipped, or missing teeth.     Pulmonary:      Breathing: unlabored       (-) no rhonchi and no rales.    Cardiovascular:     Rhythm: regular  Rate: normal  (-) murmur and peripheral edema.    Neuro/Musculoskeletal/Psych:     Mental status: alert and oriented to person, place and time.          Abdominal:     Not obese.    Current OB Status:       Other Findings:      Laboratory Data     Lab Results   Component Value Date    WBC 6.1 10/30/2021    HGB 13.3 10/30/2021    HCT 38.8 10/30/2021    MCV 93.9 10/30/2021    PLT 175 10/30/2021       No results found for: [facility]    Lab Results   Component Value Date    GLUCOSE 120 (H) 10/20/2021    BUN 22 10/20/2021    CO2 27 10/20/2021    CREATININE 0.86 10/20/2021    K 4.0 10/20/2021    NA 142 10/20/2021    CL 105 10/20/2021    CALCIUM 9.1 10/20/2021    ALBUMIN 3.8 10/20/2021    PROT 7.1 10/19/2021    ALKPHOS 73 10/19/2021    ALT 23 10/19/2021    AST 23 10/19/2021    BILITOT 0.7 10/19/2021       Lab Results   Component Value Date    INR 1.0 10/14/2021       No results found for: PREGTESTUR, PREGSERUM, HCG, HCGQUANT    Anesthesia Plan     ASA 2         Current non-smoker    Anesthesia Type:  general endotracheal.      PONV Risk Factors: current non-smoker,  plan for postoperative opioid use.              Induction:   Intravenous induction.    (Video laryngoscope)  Anesthetic plan and risks discussed with patient.    Plan,  alternatives, and risks of anesthesia, including  death, have been explained to and discussed with the patient/legal guardian.  By my assessment, the patient/legal guardian understands and agrees.  Scenario presented in detail.  Questions answered.    Use of blood products discussed with patient who consented to blood products.        Plan discussed with CRNA and RNSA.

## 2021-10-30 NOTE — Unmapped (Signed)
INTRA-OP POST BRIEFING NOTE: [name]      Specimens:   Specimens       ID Source Type Tests Collected By Collected At Frozen? Attributes Order ID Breast Spec Formalin Marked as Sent    1 Neck Swab ANAEROBIC CULTURE  FUNGUS CULTURE  AFB CULTURE PLUS STAIN (Canceled)  ROUTINE CULTURE PLUS STAIN   Tempie Hoist, MD 10/30/21 [phone]   [id number]  [id number]  [id number]  [phone]        Comment: 1. Neck Fluid Collection            Prior to leaving the room: Nurse confirmed name of procedure, completion of instrument, sponge & needle counts, reads specimen labels aloud including patient name and addresses any equipment issues? Nurse confirmed wound class. Nurse to surgeon and anesthesia: What are key concerns for recovery and management of the patient?  Yes      Blood products stored at appropriate temperatures prior to return to blood bank (if applicable)? N/A      Patient identification band secured on patient prior to transfer out of the operating room? Yes    Temporary devices implanted for the duration of the surgery removed and evaluated for intactness and completeness prior to closure? N/A      Other Comments:     SignedCheral Marker    Date: 10/30/2021    Time: 10:07 AM

## 2021-10-30 NOTE — Unmapped (Signed)
Occupational & Physical Therapy   Reason Patient Not Seen     Name: Matthew MacleodRobert [name]  DOB: 02-14-1945  Attending Physician: Tempie HoistBenjamin Motley, MD  Admission Diagnosis: Wash-out (Spine)  Date: 10/30/2021  Precautions:    Reviewed Pertinent hospital course: Yes    Unable to see patient due to: Patient to OR today.  Please update orders post-op and modify as needed. Will follow-up as appropriate. Thank you.      Time Attempted: 659

## 2021-10-30 NOTE — Unmapped (Signed)
Problem: Acute Pain  Description: Patient's pain progressing toward patient's stated pain goal  Goal: Patient will manage pain with the appropriate technique/intervention  Description: Assess and monitor patient's pain using appropriate pain scale. Collaborate with interdisciplinary team and initiate plan and interventions as ordered.  Re-assess patient's pain level 30-60 minutes after pain management intervention.  Outcome: Progressing  Goal: Patient will reduce or eliminate use of analgesics  Outcome: Progressing  Goal: Patients pain is managed to allow active participation in daily activities  Outcome: Progressing  Goal: Patient verbalizes a reduction in pain level  Outcome: Progressing     Problem: Safety  Goal: Patient will be injury free during hospitalization  Description: Assess and monitor vitals signs, neurological status including level of consciousness and orientation. Assess patient's risk for falls and implement fall prevention plan of care and interventions per hospital policy.      Ensure arm band on, uncluttered walking paths in room, adequate room lighting, call light and overbed table within reach, bed in low position, wheels locked, side rails up per policy, and non-skid footwear provided.   Outcome: Progressing  Goal: Patient with weight > 350lbs will have appropriate equipment  Description: Consider ordering Bariatric Bed, Chair and Bedside Commode for patient weight > 350 lbs.  Outcome: Progressing

## 2021-10-30 NOTE — Unmapped (Signed)
Problem: Acute Pain  Description: Patient's pain progressing toward patient's stated pain goal  Goal: Patient will manage pain with the appropriate technique/intervention  Description: Assess and monitor patient's pain using appropriate pain scale. Collaborate with interdisciplinary team and initiate plan and interventions as ordered.  Re-assess patient's pain level 30-60 minutes after pain management intervention.  Outcome: Progressing  Goal: Patient will reduce or eliminate use of analgesics  Outcome: Progressing  Goal: Patients pain is managed to allow active participation in daily activities  Outcome: Progressing  Goal: Patient verbalizes a reduction in pain level  Outcome: Progressing     Problem: Safety  Goal: Patient will be injury free during hospitalization  Description: Assess and monitor vitals signs, neurological status including level of consciousness and orientation. Assess patient's risk for falls and implement fall prevention plan of care and interventions per hospital policy.      Ensure arm band on, uncluttered walking paths in room, adequate room lighting, call light and overbed table within reach, bed in low position, wheels locked, side rails up per policy, and non-skid footwear provided.   Outcome: Progressing  Goal: Patient with weight > 350lbs will have appropriate equipment  Description: Consider ordering Bariatric Bed, Chair and Bedside Commode for patient weight > 350 lbs.  Outcome: Progressing     Problem: Patient will remain free of falls  Goal: Universal Fall Precautions  Outcome: Progressing     Problem: Daily Care  Goal: Daily care needs are met  Description: Assess and monitor ability to perform self care and identify potential discharge needs.  Outcome: Progressing     Problem: Psychosocial Needs  Goal: Demonstrates ability to cope with hospitalization/illness  Description: Assess and monitor patients ability to cope with his/her illness.  Outcome: Progressing  Goal: Collaborate  with patient/family to identify patient's goals  Outcome: Progressing     Problem: Discharge Barriers  Goal: Patient's discharge needs are met  Description: Collaborate with interdisciplinary team and initiate plans and interventions as needed.   Outcome: Progressing

## 2021-10-30 NOTE — Unmapped (Signed)
[facility]               [facility]      PATIENT NAME:      Matthew [name], Matthew [name]        MRN:               [id number]  DATE OF BIRTH:     [birth year]                    CSN:               [id number]  PHYSICIAN:         Kallie Locks, MD                ADMIT DATE:        10/29/2021  DICTATED BY:       Kallie Locks, MD                SURGERY DATE:      10/30/2021                              OPERATIVE REPORT    SURGEON:  Kallie Locks, MD      RESIDENT SURGEON:  None.    CO-SURGEON:  Dr. Tempie Hoist.    PREOPERATIVE DIAGNOSIS:  Neck swelling.    POSTOPERATIVE DIAGNOSIS:  Neck swelling.    PROCEDURE PERFORMED:  Right neck exploration with approach to anterior spine.    DETAILS OF PROCEDURE:  On 10/30/2021, [name] was brought to the OR and placed in supine position.  General anesthesia was administered.  He was intubated by the Anesthesia Service.  We reopened the right neck and immediately upon opening the   neck, there was clear red tinged fluid in the neck.  This was clearly not hematoma.  We washed all this out, took culture, and then I re-exposed the right prevertebral plane and then carefully bluntly dissected out the surgical site.  Once this exposure   was completed, I examined the esophagus that was intact.  This completed the exposure.        Kallie Locks, MD      YP/AQ  DD: 10/31/2021 13:19:06  DT: 10/31/2021 21:37:54    JOB#:  [id number]

## 2021-10-30 NOTE — Unmapped (Signed)
Location:  [facility]  Date of Surgery: 10/30/21   Patient:  Matthew [name]  MRN:  [id number]   Attending Neurosurgeon:  Tempie Hoist MD  Resident:  Janett Labella, MD  Pre-Operative Diagnosis:  Cerebrospinal fluid leak s/p anterior cervical fusion  Post-Operative Diagnosis: Cerebrospinal fluid leak s/p anterior cervical fusion     Operation:      1)  Neck exploration and repair of cerebrospinal fluid leak    2) Lumbar drain placement        Anesthesia:  General     Complications:  None     Operative Indications:     The patient is status post C3/4 anterior cervical discectomy approximately 10 days ago.  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  He underwent the ACDF and there was an intraoperative cerebrospinal fluid leak.  He was discharged without complication and was seen in the ENT clinic a few days ago with a fluid collection in his neck concerning for CSF.  He was seen in clinic yesterday again with an enlarging CSF collection, worsening tracheal deviation, and worsening pharyngeal compression.  The decision was made to directly admit the patient to the hospital and plan for neck exploration, CSF leak repair, and lumbar drain placement.  Risks of the surgery were described to the patient and his wife in detail and they are agreeable to proceed.     Operative Summary:     The patient was identified by name and medical record number in the preoperative holding area.  His history, physical and consent were noted in the chart.  He was taken to the operating room where he was handed over the anesthesia service.  He was moved to the operating room table and placed under general endotracheal anesthesia.  A timeout was called.    The patient was rolled into the right lateral decubitus position and his back was prepped and draped in the usual sterile  fashion.  A lumbar drain was placed in the usual sterile fashion.  The drain was secured to the back with suture and Tegaderm.  Patient was rolled back into the supine position and a bump was placed under her shoulders and his neck was prepped and draped in the usual sterile fashion.    The previous sutures were cut from the skin and the case was handed over to Dr. Micheal Likens who performed the neck exposure.  Of note during that portion there was copious amounts of clear fluid consistent with CSF.  Cultures were sent off.    The case was handed back over to [place] once the anterior spine and previous hardware was exposed.  There is a slow egress of clear fluid coming from the disc base consistent with cervical spinal fluid.  Self-retaining Trimline retractors were placed.  The C3 and C4 screws were removed and the previous graft was removed.  The Gelfoam from the previous surgery was suctioned away and removed using a nerve hook and curette.  We could visualize the dura and there was a slightly aggressive cerebrospinal fluid.  The specific area of the durotomy cannot be well visualized.  A combination of Surgicel, small pledgets of Gelfoam, and adhere risks were sprayed along the posterior region of the vertebral body to attempt to patch the CSF leak.  The same size graft was packed with Vivigen and inserted into the disc space under direct fluoroscopic guidance  and the screws were placed.  Of note we placed the same 18 mm screw into the C3 vertebral body and we upsized the C4 screw from 14 to 18 mm.  We took lateral shots and the graft was in good position.  We irrigated the surgical site copiously.  At that point we packed Vivigen and the disc space on the lateral sides of the the cage bilaterally.  We then covered the region with bone wax.  We then covered the disc base with more Adheris.    We then went to close.  The platysma was reapproximated with multiple interrupted 3-0 Vicryl sutures.  The subcutaneous tissue was  reapproximated with multiple interrupted 3-0 Vicryl sutures.  The skin was closed with a running 3-0 nylon suture.  The incision was cleaned and covered with a sterile dressing.  The patient was moved back to the hospital bed, extubated, and taken to the PACU in stable condition.      At the end of the case all sponge, needle and instrument counts were correct. I was present for the key portions of the case and directly available for the entire case.

## 2021-10-30 NOTE — Unmapped (Signed)
[facility]  DEPARTMENT OF NEUROSURGERY  INPATIENT PROGRESS NOTE  Matthew MacleodRobert [name]  [id number]  [birth year]    HD: 2 POD: 0    Neurosurgery Attending: Motley, MD    Interval History:  - admitted  - NAEON  - pre-op marked and consented     Meds:  Current Facility-Administered Medications   Medication Dose Frequency Provider Last Admin    acetaminophen  650 mg Q6H PRN Matthew EpleyNatalie Ivey, MD 650 mg at 10/29/21 1525    bisacodyL  10 mg Daily PRN Matthew EpleyNatalie Ivey, MD      gabapentin  600 mg Nightly (2100) Matthew Holsonback, MD      ondansetron  4 mg Q6H PRN Matthew EpleyNatalie Ivey, MD      oxyCODONE  5 mg Q4H PRN Matthew EpleyNatalie Ivey, MD 5 mg at 10/29/21 2103    senna-docusate  1 tablet BID Matthew EpleyNatalie Ivey, MD 1 tablet at 10/29/21 2103       Examination:  Temp:  [97.2 F (36.2 C)-99.1 F (37.3 C)] 99.1 F (37.3 C)  Heart Rate:  [60-86] 65  Resp:  [16-18] 16  BP: (107-147)/(63-81) 147/73    General:   Wound: c/d/I, soft tissue bulging underneath incision    Neurological  -GCS: 15  -Orientation: alert, oriented x 3 (person, place, and time)  -Language: speech fluent and appropriate  -Cranial Nerves: PERRL, EOMI, face symmetric, hearing intact, tongue midline   -Motor: FCCx4, full and symmetric strength and ROM    D EF EE HG IO   RUE:    5 5 5 5 5    LUE:     5 5 5 5 5     HF KE DF EHL PF   RLE: 5 5 5 5 5    LLE: 5 5 5 5 5   -Sensation: sensation intact to light touch and symmetric in bilateral upper and lower extremities  -Reflex: no hyperreflexia or clonus appreciated, Hoffman's absent BL, babinski downgoing BL  -Tone: No abnormality of tone appreciated in bilateral upper or lower extremities.  -Coordination:No bilateral upper extremity drift; no dysmetria on finger-to-nose or finger-nose-finger assessments.     Labs:     WBC/Hgb/Hct/Plts:  6.1/13.3/38.8/175 (09/30 0033)       Imaging Review  CT neck showing multi lobulated prevertebral R neck fluid collection emanating from surgical site at C3-4 likely representing CSF  leak.    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 Patient underwent a C3-4 ACDF on 09/19 with intraoperative CSF leak. Patient admitted now with concern for continued CSF leak.     PLAN:  - OR today for neck exploration and CSF repair with LD placement, Dr. Vella [name]. With assistance from ENT  - pre-op labs, coags, marked and consented  - NPO at MN  - continue home meds post-op  - pain control  - bowel reg  - diet regular  - DVT prophylaxis: SCDs  - Dispo: pending surgery and recovery    If further questions or concerns should arise, do not hesitate to contact the neurosurgery resident on call, [phone].    Matthew [name] M. Matthew Fleischer, MD  Neurosurgery Resident [phone]([phone])  5:06 AM 10/30/2021

## 2021-10-30 NOTE — Unmapped (Signed)
CSF leak repair, exploration of the neck, possible lumbar drain  Brief Op Note  Matthew [name]  10/29/2021 - 10/30/2021      Pre-op Diagnosis: CSF leak [G96.00]       Post-op Diagnosis: same    Procedure(s):  CSF leak repair, exploration of the neck  possible lumbar drain      Surgeon(s):  Tempie Hoist, MD  Kallie Locks, MD    Anesthesia: General Endotracheal    Staff:   Circulator: Cheral Marker  Relief Circulator: Mitzi Hansen, RN  Scrub Person: Jannette Spanner  Resident: Leontine Locket, MD    Estimated Blood Loss:  15mL                 Specimens:   Specimens       ID Description Commments Type Source Tests Collected By Collected At    1 1. Neck Fluid Collection 1. Neck Fluid Collection Swab Neck ANAEROBIC CULTURE  FUNGUS CULTURE  AFB CULTURE PLUS STAIN (Canceled)  ROUTINE CULTURE PLUS STAIN   Tempie Hoist, MD 10/30/21 [phone]                   Drains:   Lumbar Drain (Active)   Number of days: 0                There were no complications unless listed below.         Lise Auer [facility]     Date: 10/30/2021  Time: 10:20 AM

## 2021-10-30 NOTE — Unmapped (Signed)
Pt admitted to PACU #[id number] for preop; consent signed and verified; pt marked; T&S not current; H&P complete; no ABX on call to OR; see EPIC for preop documentation; anesthesia to see pt prior to surgery.

## 2021-10-30 NOTE — Unmapped (Signed)
Anesthesia Transfer of Care Note    Patient: Agostino Gorin  Procedure(s) Performed: Procedure(s):  CSF leak repair, exploration of the neck  possible lumbar drain    Patient location: Telemetry/Step Down Unit    Anesthesia type: general endotracheal    Airway Device on Arrival to PACU/ICU: Room Air    IV Access: Peripheral    Monitors Recommended to be Used During PACU/ICU: Standard Monitors    Outstanding Issues to Address: None    Level of Consciousness: awake and alert     Post vital signs:    Vitals:    10/30/21 1053   BP: 153/86   Pulse: 82   Resp: 17   Temp: 98.72F   SpO2: 100%       Complications:  No notable events documented.    Date 10/29/21 0700 - 10/30/21 0659 10/30/21 0700 - 10/31/21 0659   Shift 0700-1459 1500-2259 2300-0659 24 Hour Total 0700-1459 1500-2259 2300-0659 24 Hour Total   INTAKE   P.O.  240  240         P.O.  240  240       I.V.     1200(15.1)   1200(15.1)     Volume (mL) (lactated Ringers infusion)     1200   1200   IV Piggyback     100   100     Volume (mL) (acetaminophen (OFIRMEV) Soln)     100   100   Shift Total(mL/kg)  240(3.1)  240(3) 1300(16.4)   1300(16.4)   OUTPUT   Urine             Urine Occurrence  1 x 1 x 2 x       Shift Total(mL/kg)           Weight (kg)  77.6 79.4 79.4 79.4 79.4 79.4 79.4

## 2021-10-30 NOTE — Unmapped (Signed)
Anesthesia Extubation Criteria:    Airway Device: endotracheal tube    Emergence Details:      Smooth      _x_      Stormy       __       Prolonged   __     Extubation Criteria:      Motor strength intact       _x_      Follows commands        _x_      Good airway reflexes      _x_      OP suctioned                  _x_        Follows commands:  Yes     Patient extubated:  Yes

## 2021-10-31 ENCOUNTER — Encounter

## 2021-10-31 LAB — BASIC METABOLIC PANEL
Anion Gap: 10 mmol/L (ref 3–16)
BUN: 21 mg/dL (ref 7–25)
CO2: 26 mmol/L (ref 21–33)
Calcium: 9.1 mg/dL (ref 8.6–10.3)
Chloride: 106 mmol/L (ref 98–110)
Creatinine: 0.94 mg/dL (ref 0.60–1.30)
EGFR: 85
Glucose: 127 mg/dL (ref 70–100)
Osmolality, Calculated: 299 mOsm/kg (ref 278–305)
Potassium: 3.8 mmol/L (ref 3.5–5.3)
Sodium: 142 mmol/L (ref 133–146)

## 2021-10-31 LAB — CBC
Hematocrit: 35.5 % (ref 38.5–50.0)
Hemoglobin: 12.4 g/dL (ref 13.2–17.1)
MCH: 32.7 pg (ref 27.0–33.0)
MCHC: 34.9 g/dL (ref 32.0–36.0)
MCV: 93.7 fL (ref 80.0–100.0)
MPV: 8.2 fL (ref 7.5–11.5)
Platelets: 160 10*3/uL (ref 140–400)
RBC: 3.79 10*6/uL (ref 4.20–5.80)
RDW: 13 % (ref 11.0–15.0)
WBC: 11.9 10*3/uL (ref 3.8–10.8)

## 2021-10-31 LAB — MAGNESIUM: Magnesium: 2 mg/dL (ref 1.5–2.5)

## 2021-10-31 LAB — PHOSPHORUS: Phosphorus: 2.8 mg/dL (ref 2.1–4.5)

## 2021-10-31 MED ORDER — diazePAM (VALIUM) tablet 2 mg
2 | Freq: Once | ORAL | Status: AC
Start: 2021-10-31 — End: 2021-10-31
  Administered 2021-11-01: 2 mg via ORAL

## 2021-10-31 MED ORDER — HYDROmorphone (DILAUDID) injection Syrg 0.5 mg
0.5 | Freq: Once | INTRAMUSCULAR | Status: AC
Start: 2021-10-31 — End: 2021-10-31
  Administered 2021-10-31: 19:00:00 0.5 mg via INTRAVENOUS

## 2021-10-31 MED FILL — OXYCODONE 5 MG TABLET: 5 5 MG | ORAL | Qty: 1

## 2021-10-31 MED FILL — TYLENOL 325 MG TABLET: 325 325 mg | ORAL | Qty: 2

## 2021-10-31 MED FILL — LATANOPROST 0.005 % EYE DROPS: 0.005 0.005 % | OPHTHALMIC | Qty: 2.5

## 2021-10-31 MED FILL — GABAPENTIN 300 MG CAPSULE: 300 300 MG | ORAL | Qty: 2

## 2021-10-31 MED FILL — CEFAZOLIN 1 GRAM SOLUTION FOR INJECTION: 1 1 g | INTRAMUSCULAR | Qty: 1

## 2021-10-31 MED FILL — DOXEPIN 10 MG CAPSULE: 10 10 MG | ORAL | Qty: 1

## 2021-10-31 MED FILL — SENNOSIDES 8.6 MG-DOCUSATE SODIUM 50 MG TABLET: 8.6-50 8.6-50 mg | ORAL | Qty: 1

## 2021-10-31 MED FILL — HYDROMORPHONE 0.5 MG/0.5 ML INJECTION SYRINGE: 0.5 0.5 mg/0.5 mL | INTRAMUSCULAR | Qty: 0.5

## 2021-10-31 MED FILL — TIMOLOL MALEATE 0.5 % EYE DROPS: 0.5 0.5 % | OPHTHALMIC | Qty: 5

## 2021-10-31 MED FILL — ONDANSETRON HCL (PF) 4 MG/2 ML INJECTION SOLUTION: 4 4 mg/2 mL | INTRAMUSCULAR | Qty: 2

## 2021-10-31 MED FILL — CETIRIZINE 10 MG TABLET: 10 10 MG | ORAL | Qty: 1

## 2021-10-31 MED FILL — BACLOFEN 10 MG TABLET: 10 10 MG | ORAL | Qty: 1

## 2021-10-31 MED FILL — FINASTERIDE 5 MG TABLET: 5 5 mg | ORAL | Qty: 1

## 2021-10-31 MED FILL — AMLODIPINE 5 MG TABLET: 5 5 MG | ORAL | Qty: 1

## 2021-10-31 MED FILL — ATORVASTATIN 40 MG TABLET: 40 40 MG | ORAL | Qty: 1

## 2021-10-31 NOTE — Unmapped (Signed)
Occupational Therapy  Initial Assessment     Name: Matthew MacleodRobert [name]  DOB: 1946/01/30  Attending Physician: Tempie HoistBenjamin Motley, MD  Admission Diagnosis: CSF leak [G96.00]  Date: 10/31/2021  Room: 4782/[id number]      Hospital Course PT/OT: 76 y.o. male presents with neck pain and swelling. 9/30 OR: CSF leak repair, neck exploration, lumbari drain placement.  Relevant PMH : C3-C4 ACDF with introperative CSF leak  Precautions: none  Activity Level: Activity as tolerated    Assist: Co-evaluation performed    Recommendation  Recommendation: Home OT  Equipment Recommendations: Patient already has needed DME      Assessment  Assessment: Decreased ADL status, Decreased Functional Mobility, Decreased activity tolerance         Prognosis for OT goals: Good                        Outcome Measures  AM-PAC 6 Clicks Daily Activity Inpatient Short Form: OT 6 Clicks Score: 21    Home Living/Prior Function  Patient able to provide accurate information at this time: Yes  Lives With: Spouse  Assistance available: 24 hour assistance  Type of Home: House  Home Entry: Elevator  Home Layout: One level  Bathroom Shower/Tub: Pension scheme managerWalk-in shower  Bathroom Toilet: Midwifetandard  Bathroom Equipment: Paediatric nursehower chair  Home Equipment: Agricultural consultantolling Walker    Prior Function  Functional Mobility: Independent ( no assistive device)  Receives Help From: None needed prior to admission  ADL Assistance: Independent  IADL Assistance: Independent  Vocation: Retired  Leisure: Hobbies-yes (Comment)  Leisure Activities: tennis, working out    Pain  Pain Score: 5   Pain Location: Shoulder (L)  Pain Descriptors: Nagging  Pain Intervention(s): Ambulation/increased activity    Cognition  Overall Cognitive Status: Within Functional Limits  Cognitive Assessment: Arousal/ Alertness;Orientation Level;Behavior;Following Commands;Safety Judgment  Arousal/Alertness: Alert  Orientation Level: Oriented X4  Behavior: Appropriate;Cooperative;Motivated  Following Commands: Follows all commands  and directions without difficulty  Safety Judgment: Good awareness of safety precautions    Vision  Overall Vision/ Perception: Within Functional Limits       Right Upper Extremity   Right UE ROM: Grossly WFL as observed during functional activities  Right UE Strength: Grossly WFL (at least 3+/5) as observed during functional activities  Right UE Muscle Tone: Normal  Right Hand Function: Grossly WFL as observed during functional activity      Left Upper Extremity  Left UE ROM: Grossly WFL as observed during functional activities  Left UE Strength: Grossly WFL (at least 3+/5) as observed during functional activities  Left UE Muscle Tone: Normal  Left UE Hand Function: Grossly WFL as observed during functional activites      Neuromuscular  Overall Sensation: Impaired  Additional Comments: mild tingling in B feet       Functional Mobility  Bed Mobility   Supine to Sit: Supervision;head of bed elevated;towards the left  Sit to Supine: Supervision;head of bed elevated  Transfers  Sit to Stand: Minimal assistance (HHA)  Stand to Sit: Contact guard assistance;with assistive device  Stand to Sit Assistive Device: Rolling walker  Functional Mobility: Contact guard assistance;with assistive device (Initial min A with HHA)  Functional Mobility Assistive Device: Rolling walker  Balance  Sitting - Static: Supervision  Sitting-Dynamic: Supervision   Standing-Static: Stand by assistance  Standing-DynamicContractor: Contact Guard Assistance;With Assistive Device  Standing-Dynamic Assistive Device: Rolling walker    Gait belt used: Yes    ADL  Lower Body Dressing: Modified independent (Device)  Lower Body Dressing Deficit: Don/doff L sock;Don/doff R sock  Location Assessed LE Dressing: Seated edge of bed       Position after Treatment/Safety Handoff  Position after therapy session: Bed  Details: RN notified;Call light/ needs within reach  Alarms: Bed  Alarms Status: Activated and Interfaced with call system    Plan  Plan  Treatment  Interventions: ADL retraining, Patient/Family training, Activity Tolerance training, Compensatory technique education, Energy Conservation, Functional transfer training, Therapeutic Activity  OT Frequency: minimum 2x/week    The plan of care and recommendations assesses the patient's and/or caregiver's readiness, willingness, and ability to provide or support functional mobility and ADL tasks as needed upon discharge.    Goals   Goals to be met in: 1 week  Patient stated goal: to go home  Patient will complete supine to sit in prep for ADLs: Independent  Patient will complete toilet transfer: Modified Independent  Patient will complete toileting: Modified Independent  Patient will complete grooming task: Independent (in stance)  Patient will complete lower body dressing: Modified Independent (don pants)  Long Term Goal : =STG  Collaborated with: Patient    Patient/Family Education  Educated patient on the role of occupational therapy, OT goals, OT plan of care, discharge recommendation, ADL training, functional mobility training, and the importance of safety and fall prevention strategies including need for supervision/ assistance with OOB activity and use of call light. patient  verbalized understanding.    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

## 2021-10-31 NOTE — Unmapped (Signed)
Problem: Acute Pain  Description: Patient's pain progressing toward patient's stated pain goal  Goal: Patient will manage pain with the appropriate technique/intervention  Description: Assess and monitor patient's pain using appropriate pain scale. Collaborate with interdisciplinary team and initiate plan and interventions as ordered.  Re-assess patient's pain level 30-60 minutes after pain management intervention.  Outcome: Progressing  Goal: Patient will reduce or eliminate use of analgesics  Outcome: Progressing  Goal: Patients pain is managed to allow active participation in daily activities  Outcome: Progressing  Goal: Patient verbalizes a reduction in pain level  Outcome: Progressing     Problem: Safety  Goal: Patient will be injury free during hospitalization  Description: Assess and monitor vitals signs, neurological status including level of consciousness and orientation. Assess patient's risk for falls and implement fall prevention plan of care and interventions per hospital policy.      Ensure arm band on, uncluttered walking paths in room, adequate room lighting, call light and overbed table within reach, bed in low position, wheels locked, side rails up per policy, and non-skid footwear provided.   Outcome: Progressing  Goal: Patient with weight > 350lbs will have appropriate equipment  Description: Consider ordering Bariatric Bed, Chair and Bedside Commode for patient weight > 350 lbs.  Outcome: Progressing     Problem: Patient will remain free of falls  Goal: Universal Fall Precautions  Outcome: Progressing     Problem: Daily Care  Goal: Daily care needs are met  Description: Assess and monitor ability to perform self care and identify potential discharge needs.  Outcome: Progressing     Problem: Psychosocial Needs  Goal: Demonstrates ability to cope with hospitalization/illness  Description: Assess and monitor patients ability to cope with his/her illness.  Outcome: Progressing  Goal: Collaborate  with patient/family to identify patient's goals  Outcome: Progressing     Problem: Discharge Barriers  Goal: Patient's discharge needs are met  Description: Collaborate with interdisciplinary team and initiate plans and interventions as needed.   Outcome: Progressing     Problem: Patient will remain free of injury d/t fall  Goal: High Fall Risk Precautions  Outcome: Progressing  Goal: Additional High Fall Risk Precautions (consider the following)  Outcome: Progressing

## 2021-10-31 NOTE — Unmapped (Signed)
Anesthesia Post Note    Patient: Matthew MacleodRobert [name]    Procedure(s) Performed: Procedure(s):  CSF leak repair, exploration of the neck  possible lumbar drain    Anesthesia type: general endotracheal    Patient location: [facility] Surgical Floor    Airway: Patent    Post pain: Adequate analgesia    Nausea / Vomiting: Absent    Post-operative Hydration Status: Adequate    Post assessment: no apparent anesthetic complications    Last Vitals:   Vitals:    10/31/21 0200 10/31/21 0218 10/31/21 0400 10/31/21 0600   BP: 138/74  138/76 128/72   BP Location:   Right forearm    Patient Position:   Lying    BP Cuff Size:   Regular    Pulse: 73  83 73   Resp: 14  29 14    Temp:   97.8 F (36.6 C)    TempSrc:   Oral    SpO2: 99% 98% 97% 99%   Weight:       Height:            Post vital signs: stable    Level of consciousness: awake    Complications:  There were no known notable events for this encounter.

## 2021-10-31 NOTE — Unmapped (Signed)
[facility]  DIVISION OF OTOLARYNGOLOGY- HEAD & NECK SURGERY  GENERAL INPATIENT PROGRESS NOTE      ASSESSMENT  Matthew [name] is a 76 y.o. male s/p C3-4 ACDF on 9/19 complicated by intraoperative CSF leak who presented with a CSF leak into the neck and parapharyngeal space. Patient is s/p CSF leak repair and neck exploration on 9/30.     PLAN  - Admitted to NSGY  - No acute ENT intervention   - Routine incision care  - Page ENT stat if any concerns for impending respiratory distress    SUBJECTIVE  Patient breathing comfortably on RA. Denies dyspnea and dysphgai.    PHYSICAL EXAM  BP 143/76   Pulse 78   Temp 98.3 F (36.8 C) (Oral)   Resp 15   Ht 5' 11 (1.803 m)   Wt 175 lb (79.4 kg)   SpO2 95%   BMI 24.41 kg/m     GENERAL: No Acute Distress, Alert and Oriented  EYES: EOMI, Anti-icteric  NOSE: No epistaxis  EARS: Normal auricles bilaterally  FACE: 1/6 House-Brackmann Scale  NECK: right neck incision intact, flat  CHEST: Normal respiratory effort, no retractions. No stridor    LABORATORY  Past 24 hour labs:   Lab Results   Component Value Date    WBC 11.9 (H) 10/31/2021    HGB 12.4 (L) 10/31/2021    HCT 35.5 (L) 10/31/2021    MCV 93.7 10/31/2021    PLT 160 10/31/2021     Lab Results   Component Value Date    GLUCOSE 127 (H) 10/31/2021    BUN 21 10/31/2021    CREATININE 0.94 10/31/2021    K 3.8 10/31/2021    NA 142 10/31/2021    CL 106 10/31/2021    CALCIUM 9.1 10/31/2021     Lab Results   Component Value Date    MG 2.0 10/31/2021     Lab Results   Component Value Date    PHOS 2.8 10/31/2021     Lab Results   Component Value Date    ALKPHOS 73 10/19/2021    ALT 23 10/19/2021    AST 23 10/19/2021    BILITOT 0.7 10/19/2021    ALBUMIN 3.8 10/20/2021    PROT 7.1 10/19/2021     No results found for: LDH  No results found for: PTT  No results found for: AMYLASE  No results found for: LIPASE    RADIOLOGY  X-ray Cervical Spine 2 or 3-views    Result Date: 10/20/2021  EXAM: XR CERVICAL SPINE  2 OR 3-VIEWS  INDICATION:  Other - Must specify in Comments field,  C3-4 ACDF TECHNIQUE:  XR CERVICAL SPINE 2 OR 3-VIEWS COMPARISON: Cervical spine fluoroscopy on 10/19/2021, MRI cervical spine on 08/17/2021. FINDINGS: Postsurgical changes from interbody disc spacer at C3-4 without evidence of hardware fracture or loosening. Anterior neck surgical drain at the level of C4 on the right. Cervical vertebral bodies are normal alignment. C6 and C7 vertebral bodies are partially obscured by soft tissue. Vertebral body heights are maintained. Mild lower cervical degenerative disc disease. Posterior elements are intact. No prevertebral soft tissue swelling.     IMPRESSION: 1.  Interbody disc spacer at C3-4 without hardware complication. 2.  Mild lower cervical degenerative disc disease. Approved by Carmelia Bake, DO on 10/20/2021 1:36 PM EDT I have personally reviewed the images and I agree with this report. Report Verified by: Shelba Flake, MD at 10/20/2021 1:57 PM EDT  X-ray Cervical Spine 2 or 3-views    Result Date: 10/19/2021    XR CERVICAL SPINE 2 OR 3-VIEWS, FL FLUORO UP TO 1 HOUR TECHNIQUE : Intraoperative fluoroscopy was performed without a radiologist present. HISTORY:  Other - Must specify in Comments field FLUOROSCOPY TIME: 18 seconds FINDINGS: Fluoroscopy was performed without a radiologist present.  Spot images were obtained.     IMPRESSION: Intraoperative fluoroscopy was performed. Correlate with procedural note for additional information. Report Verified by: Shelba Flakeimothy Klostermeier, MD at 10/19/2021 4:56 PM EDT    CT Neck WO contrast    Result Date: 10/29/2021  EXAM: CT NECK WO CONTRAST INDICATION: operative planning. TECHNIQUE: Axial thin section CT images of the neck were obtained without contrast . Sagittal and coronal 2D multiplanar reconstructions were performed at the scanner.  COMPARISON: None available. FINDINGS: The diagnostic quality of the examination is adequate. Evaluation is limited in the  absence of intravenous contrast. Nasopharynx: Symmetric with no evidence of mass. Normal torus tubarius, fossa of Rosenmuller, and adenoid tonsillar tissue. Suprahyoid neck: Streak artifact from dental amalgam and lack of intravenous contrast limits evaluation of the oral cavity. There is a multilobulated fluid collection emanating from the retropharynx at the level of C3 extending rightward into the right neck inferiorly along the sternocleidomastoid muscle. The collection measures fluid density. Nongeometric shape makes accurate measurements difficult though approximately measures 3.1 x 7.1 x 10 cm. Inferiorly the fluid tracks along the prevertebral soft tissues to the level of C6. Of note, right internal carotid arteries immediately posterior to the lateral component of the fluid collection. Infrahyoid neck:An open glottis is imaged. Normal larynx, hypopharynx, and supraglottis. Lymph nodes: No abnormal lymph nodes are identified. Salivary Glands: The bilateral parotid, submandibular, and sublingual glands demonstrate normal attenuation. No dilated ductal system or calculus confirmed. Thyroid: Normal. Brain: The visualized portions of the brain appear normal. There are atherosclerotic vessel calcifications at the skull base. Orbits, paranasal sinuses, and skull base: No abnormalities of the orbits are identified. The paranasal sinuses are clear. The mastoid air cells are clear. Vascular structures: Minimal calcified plaques are present involving the carotid bifurcations. While not adequately assessed without contrast, no definite stenosis is suspected. Thoracic inlet: The included lung apices demonstrate no suspicious nodule or other significant abnormal finding. Bones: Postsurgical changes related to C3-C4 ACDF with interbody spacer. Flowing bulky ventral osteophytes span C2-T2 with bulky ventral ankylosis. There is disruption of the ankylosis at the surgical site, likely posttraumatic/postsurgical. Posterior  vertebral body osteophytes with bulky disc osteophyte complex/ossification of the posterior longitudinal ligament at C3-C4 at least moderately narrows the spinal canal with suspected spinal cord compression. Multilevel uncovertebral hypertrophy contributes to multilevel foraminal stenosis, worst at C3-C4 on the right where there is severe foraminal stenosis..     IMPRESSION: 1.  Nonspecific large multilobulated prevertebral/right neck fluid collection emanating from the surgical site at C3-C4. This could represent a postoperative seroma/serosanguineous collection though CSF leak and collection is a consideration given the reported intraoperative CSF leak. 2.  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 Report Verified by: Ezzie DuralMatthew Moler, DO at 10/29/2021 9:47 PM EDT    Fluoro up to 1 hour    Result Date: 10/19/2021    XR CERVICAL SPINE 2 OR 3-VIEWS, FL FLUORO UP TO 1 HOUR TECHNIQUE : Intraoperative fluoroscopy was performed without a radiologist present. HISTORY:  Other - Must specify in Comments field FLUOROSCOPY TIME: 18 seconds FINDINGS: Fluoroscopy was performed without a radiologist present.  Spot images were obtained.  IMPRESSION: Intraoperative fluoroscopy was performed. Correlate with procedural note for additional information. Report Verified by: Shelba Flake, MD at 10/19/2021 4:56 PM EDT

## 2021-10-31 NOTE — Unmapped (Signed)
Physical Therapy  Initial Assessment     Name: Matthew [name]  DOB: [birth year]  Attending Physician: Tempie Hoist, MD  Admission Diagnosis: CSF leak [G96.00]  Date: 10/31/2021  Room: 1610/R6045  Reviewed Pertinent hospital course: Yes    Hospital Course PT/OT: 76 y.o. male presents with neck pain and swelling. 9/30 OR: CSF leak repair, neck exploration, lumbari drain placement.  Relevant PMH : C3-C4 ACDF with introperative CSF leak  Precautions: none  Activity Level: Activity as tolerated    Assist: Co-evaluation performed    Assessment  Assessment: Impaired Bed Mobility, Impaired Balance, Impaired Transfers, Impaired Gait, Impaired Strength, Impaired Stair Negotiation  Prognosis: Good  Pt tolerated PT assessment well this date. He demos increased instability and generalized deconditioning compared to PLOF. He requires use of RW for balance during gait on level surfaces. Pt will benefit from acute skilled PT intervention to improve these impairments to increase safety and independence with functional mobility.    Recommendation  Recommendation: Home PT  Equipment Recommended: Rolling walker, Patient already has needed DME    Justification for DME ordered: [name]: Patient has decreased weight bearing or impaired balance putting them at risk for falling without use of a walker. They are unable to utilize crutches or a cane to provide adequate support    Outcome Measures  AM-PAC 6 Clicks Basic Mobility Inpatient Short Form: PT 6 Clicks Score: 18          Mobility Recommendations for Staff  Patient ability: Patient ambulates in hallway  Assist needed: with 1 person assist  Equipment/ Precautions needed: Requires assistive device  Requires Assistive Device: Rolling walker    Home Living/Prior Function  Patient able to provide accurate information at this time: Yes  Lives With: Spouse  Assistance available: 24 hour assistance  Type of Home: House  Home Entry: Elevator  Home Layout: One level  Bathroom  Shower/Tub: Pension scheme manager: Midwife: Buyer, retail: Agricultural consultant  Prior Function  Functional Mobility: Independent ( no assistive device)  Receives Help From: None needed prior to admission  ADL Assistance: Independent  IADL Assistance: Independent  Vocation: Retired  Leisure Activities: tennis, working out     Pain  Pain Score: 5   Pain Location: Shoulder (L)  Pain Descriptors: Nagging  Pain Intervention(s): Ambulation/increased activity    Vision  Vision/Perception  Overall Vision/ Perception: Within Conservator, museum/gallery  Overall Cognitive Status: Within Functional Limits  Cognitive Assessment: Arousal/ Alertness;Orientation Level;Behavior;Following Commands;Safety Judgment  Arousal/Alertness: Alert  Orientation Level: Oriented X4  Behavior: Appropriate;Cooperative;Motivated  Following Commands: Follows all commands and directions without difficulty  Safety Judgment: Good awareness of safety precautions    Neuromuscular  Overall Sensation: Impaired  Additional Comments: mild tingling in B feet          Upper Extremity  UE Assessment: Defer to OT evaluation for formal assessment    Lower Extremity  Lower Extremity  LE Assessment: Strength WFL (at least 3+/5) as observed during functional activity          Functional Mobility  Bed Mobility   Supine to Sit: Supervision;head of bed elevated;towards the left  Sit to Supine: Supervision;head of bed elevated  Transfers  Sit to Stand: Minimal assistance (unable to stand on first attempt; cues for hand placement; successful on second attempt)  Stand to Sit: Minimal assistance  Gait  Distance (in feet): 80'  Level of assistance: Minimal assistance;Contact Guard  assistance (min A no AD progressing to CGA using RW)  Assistive Device: Rolling walker  Gait Characteristics: Steady;R decreased step length;L decreased step length;decreased cadence;Increased trunk flexion  Balance  Sitting - Static:  Supervision  Sitting-Dynamic: Supervision   Standing-Static: Geophysicist/field seismologist (+RW)  Standing-Dynamic: Geophysicist/field seismologist (+RW)       Gait belt used: Yes    Position after Therapy/Safety Handoff  Position after treatment and safety handoff  Position after therapy session: Bed  Details: Call light/ needs within reach  Alarms: Bed  Alarms Status: Activated and Interfaced with call system    Goals  Collaborated with: Patient  Patient Stated Goal: to go home  Goals to be met by: 11/07/21  Patient will transition from supine to sit: Independent  Patient will transition from sit to supine: Independent  Patient will transfer from sit to stand: Contact Guard assistance  Patient will ambulate: Supervision (150' with LRAD)  Long Term Goal : Pt will tolerate formal balance assessment     Patient/Family Education  Educated patient on the role of physical therapy, goals, plan of care, importance of increased activity, discharge recommendations, and safety and need for supervision during OOB activity and fall prevention strategies, including need for supervision/ assistance with OOB activity and use of call light; patient verbalized understanding. Handout(s) issued: none.    Plan  Plan  Treatment/Interventions: LE strengthening/ROM, Museum/gallery curator, Therapeutic Activity, Gait training, Therapeutic Exercise, Patient/family training, Neuromuscular Reeducation  PT Frequency: minimum 3x/week    The plan of care and recommendations assesses the patient's and/or caregiver's readiness, willingness, and ability to provide or support functional mobility and ADL tasks as needed upon discharge.        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

## 2021-10-31 NOTE — Unmapped (Signed)
[facility]  DEPARTMENT OF NEUROSURGERY  INPATIENT PROGRESS NOTE  Matthew [name]  [id number]  1945-06-06    HD: 3 POD: 1    Neurosurgery Attending: Motley, MD    Interval History:  - OR for neck exploration and CSF leak repaired  - Mild swelling over the right neck incision without erythema.     Meds:  Current Facility-Administered Medications   Medication Dose Frequency Provider Last Admin    acetaminophen  650 mg Q6H Vickie Epley, MD 650 mg at 10/31/21 0704    amLODIPine  5 mg Daily 0900 Leontine Locket, MD 5 mg at 10/31/21 5409    atorvastatin  40 mg Nightly (2100) Leontine Locket, MD 40 mg at 10/30/21 2017    baclofen  10 mg Daily PRN Leontine Locket, MD      bisacodyL  10 mg Daily PRN Leontine Locket, MD      brimonidine  1 drop BID Leontine Locket, MD 1 drop at 10/30/21 2023    ceFAZolin (ANCEF) IVPB  1 g Q8H Leontine Locket, MD 1 g at 10/31/21 0402    cetirizine  10 mg Daily 0900 Leontine Locket, MD 10 mg at 10/31/21 0825    doxepin  10 mg Nightly (2100) Leontine Locket, MD 10 mg at 10/30/21 2017    finasteride  5 mg Daily 0900 Leontine Locket, MD 5 mg at 10/31/21 0825    gabapentin  600 mg TID Leontine Locket, MD 600 mg at 10/31/21 0824    latanoprost  1 drop Nightly (2100) Leontine Locket, MD 1 drop at 10/30/21 2024    ondansetron  4 mg Q6H PRN Leontine Locket, MD      oxyCODONE  5 mg Q4H PRN Leontine Locket, MD 5 mg at 10/31/21 8119    senna-docusate  1 tablet BID Leontine Locket, MD 1 tablet at 10/31/21 0824    timolol  1 drop Q12H Leontine Locket, MD 1 drop at 10/30/21 2208       Examination:  Temp:  [97.5 F (36.4 C)-99 F (37.2 C)] 98.3 F (36.8 C)  Heart Rate:  [73-94] 83  Resp:  [13-29] 15  BP: (106-153)/(66-88) 140/88  FiO2:  [52 %-100 %] 100 %    General:   Wound: c/d/I, soft tissue bulging underneath incision smaller from prior    Neurological  -GCS: 15  -Orientation: alert, oriented x 3 (person, place, and time)  -Language: speech fluent and appropriate  -Cranial Nerves: PERRL, EOMI, face  symmetric, hearing intact, tongue midline   -Motor: FCCx4, full and symmetric strength and ROM    D EF EE HG IO   RUE:    5 5 5 5 5    LUE:     5 5 5 5 5     HF KE DF EHL PF   RLE: 5 5 5 5 5    LLE: 5 5 5 5 5   -Sensation: sensation intact to light touch and symmetric in bilateral upper and lower extremities  -Reflex: no hyperreflexia or clonus appreciated, Hoffman's absent BL, babinski downgoing BL  -Tone: No abnormality of tone appreciated in bilateral upper or lower extremities.  -Coordination:No bilateral upper extremity drift; no dysmetria on finger-to-nose or finger-nose-finger assessments.     Labs:  Na/K/Cl/CO2:  142/3.8/106/26 (10/01 0015)  WBC/Hgb/Hct/Plts:  11.9/12.4/35.5/160 (10/01 0015)       Imaging Review  CT neck showing multi lobulated prevertebral R neck fluid  collection emanating from surgical site at C3-4 likely representing CSF leak.    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 Patient underwent a C3-4 ACDF on 09/19 with intraoperative CSF leak. Patient admitted now with concern for continued CSF leak.     PLAN:  - OR for neck exploration and CSF repair with LD placement, Dr. Vella Redhead. With assistance from ENT - complete  - LD@10  cc/hr for wound healing.   - HOB >45 degress at all tiems  - pre-op labs, coags, marked and consented  - continue home meds post-op  - pain control  - bowel reg  - diet regular  - DVT prophylaxis: SCDs, [facility] pending LD?  - Dispo: pending surgery and recovery    If further questions or concerns should arise, do not hesitate to contact the neurosurgery resident on call,  [phone].    Vickie Epley, MD   Neurosurgery Resident   10:05 AM 10/31/2021  Pager - [phone]

## 2021-11-01 ENCOUNTER — Inpatient Hospital Stay: Admit: 2021-11-01 | Payer: MEDICARE

## 2021-11-01 ENCOUNTER — Encounter

## 2021-11-01 LAB — BASIC METABOLIC PANEL
Anion Gap: 8 mmol/L (ref 3–16)
BUN: 16 mg/dL (ref 7–25)
CO2: 30 mmol/L (ref 21–33)
Calcium: 8.8 mg/dL (ref 8.6–10.3)
Chloride: 102 mmol/L (ref 98–110)
Creatinine: 0.78 mg/dL (ref 0.60–1.30)
EGFR: >90
Glucose: 131 mg/dL (ref 70–100)
Osmolality, Calculated: 293 mOsm/kg (ref 278–305)
Potassium: 4 mmol/L (ref 3.5–5.3)
Sodium: 140 mmol/L (ref 133–146)

## 2021-11-01 LAB — CBC
Hematocrit: 34.2 % (ref 38.5–50.0)
Hemoglobin: 11.6 g/dL (ref 13.2–17.1)
MCH: 32.1 pg (ref 27.0–33.0)
MCHC: 33.7 g/dL (ref 32.0–36.0)
MCV: 95.3 fL (ref 80.0–100.0)
MPV: 8.4 fL (ref 7.5–11.5)
Platelets: 147 10*3/uL (ref 140–400)
RBC: 3.6 10*6/uL (ref 4.20–5.80)
RDW: 13 % (ref 11.0–15.0)
WBC: 11.8 10*3/uL (ref 3.8–10.8)

## 2021-11-01 MED ORDER — HYDROmorphone (DILAUDID) injection Syrg 1 mg
1 | INTRAMUSCULAR | Status: AC | PRN
Start: 2021-11-01 — End: 2021-11-04
  Administered 2021-11-02 (×4): 1 mg via INTRAVENOUS

## 2021-11-01 MED ORDER — methocarbamoL (ROBAXIN) injection 1,000 mg
100 | Freq: Three times a day (TID) | INTRAMUSCULAR | Status: AC
Start: 2021-11-01 — End: 2021-11-04
  Administered 2021-11-01 – 2021-11-04 (×8): 1000 mg via INTRAVENOUS

## 2021-11-01 MED ORDER — oxyCODONE (ROXICODONE) immediate release tablet 10 mg
5 | ORAL | PRN
Start: 2021-11-01 — End: 2021-11-05
  Administered 2021-11-01 – 2021-11-03 (×4): 10 mg via ORAL

## 2021-11-01 MED ORDER — oxyCODONE (ROXICODONE) immediate release tablet 5 mg
5 | ORAL | PRN
Start: 2021-11-01 — End: 2021-11-05
  Administered 2021-11-04 – 2021-11-05 (×2): 5 mg via ORAL

## 2021-11-01 MED ORDER — electrolyte-R (pH 7.4) (NORMOSOL-R pH 7.4) IV solution
INTRAVENOUS
Start: 2021-11-01 — End: 2021-11-05
  Administered 2021-11-01 – 2021-11-04 (×3): 75 mL/h via INTRAVENOUS

## 2021-11-01 MED ORDER — VARI-BAR (barium) Pste 1 teaspoonful
40 | Freq: Once | ORAL | Status: AC | PRN
Start: 2021-11-01 — End: 2021-11-01
  Administered 2021-11-01: 20:00:00 1 [tsp_us] via ORAL

## 2021-11-01 MED ORDER — barium sulfate (VARIBAR NECTAR) suspension 10 mL
40 | Freq: Once | ORAL | Status: AC | PRN
Start: 2021-11-01 — End: 2021-11-01
  Administered 2021-11-01: 20:00:00 10 mL via ORAL

## 2021-11-01 MED ORDER — heparin (porcine) injection 5,000 Units
5000 | Freq: Three times a day (TID) | INTRAMUSCULAR
Start: 2021-11-01 — End: 2021-11-09
  Administered 2021-11-02 – 2021-11-09 (×23): 5000 [IU] via SUBCUTANEOUS

## 2021-11-01 MED ORDER — electrolyte-R (pH 7.4) (NORMOSOL-R pH 7.4) IV solution
INTRAVENOUS
Start: 2021-11-01 — End: 2021-11-01

## 2021-11-01 MED ORDER — VARIBAR HONEY (barium) suspension 1 teaspoonful
40 | Freq: Once | ORAL | Status: AC | PRN
Start: 2021-11-01 — End: 2021-11-01
  Administered 2021-11-01: 20:00:00 1 [tsp_us] via ORAL

## 2021-11-01 MED ORDER — HYDROmorphone (DILAUDID) injection Syrg 0.5 mg
0.5 | INTRAMUSCULAR | Status: AC | PRN
Start: 2021-11-01 — End: 2021-11-04

## 2021-11-01 MED ORDER — dexamethasone (DECADRON) injection 8 mg
10 | Freq: Once | INTRAMUSCULAR | Status: AC
Start: 2021-11-01 — End: 2021-11-01
  Administered 2021-11-01: 23:00:00 8 mg via INTRAVENOUS

## 2021-11-01 MED ORDER — VARIBAR THIN (barium) Powd 1 teaspoonful
81 | Freq: Once | ORAL | Status: AC | PRN
Start: 2021-11-01 — End: 2021-11-01
  Administered 2021-11-01: 20:00:00 1 [tsp_us] via ORAL

## 2021-11-01 MED ORDER — HYDROmorphone (DILAUDID) injection Syrg 0.5 mg
0.5 | INTRAMUSCULAR | PRN
Start: 2021-11-01 — End: 2021-11-01
  Administered 2021-11-02: 0.5 mg via INTRAVENOUS

## 2021-11-01 MED FILL — GABAPENTIN 300 MG CAPSULE: 300 300 MG | ORAL | Qty: 2

## 2021-11-01 MED FILL — OXYCODONE 5 MG TABLET: 5 5 MG | ORAL | Qty: 2

## 2021-11-01 MED FILL — FINASTERIDE 5 MG TABLET: 5 5 mg | ORAL | Qty: 1

## 2021-11-01 MED FILL — AMLODIPINE 5 MG TABLET: 5 5 MG | ORAL | Qty: 1

## 2021-11-01 MED FILL — DEXAMETHASONE SODIUM PHOSPHATE 10 MG/ML INJECTION SOLUTION: 10 10 mg/mL | INTRAMUSCULAR | Qty: 1

## 2021-11-01 MED FILL — SENNOSIDES 8.6 MG-DOCUSATE SODIUM 50 MG TABLET: 8.6-50 8.6-50 mg | ORAL | Qty: 1

## 2021-11-01 MED FILL — OXYCODONE 5 MG TABLET: 5 5 MG | ORAL | Qty: 1

## 2021-11-01 MED FILL — VARIBAR HONEY 40 % (W/V), 29% (W/W) ORAL SUSPENSION: 40 40 % (w/v) 29% (w/w) | ORAL | Qty: 250

## 2021-11-01 MED FILL — CEFAZOLIN 1 GRAM SOLUTION FOR INJECTION: 1 1 g | INTRAMUSCULAR | Qty: 1

## 2021-11-01 MED FILL — VARIBAR NECTAR 40 % (W/V), 30 % (W/W) ORAL SUSPENSION: 40 40 % (w/v) | ORAL | Qty: 240

## 2021-11-01 MED FILL — DIAZEPAM 2 MG TABLET: 2 2 MG | ORAL | Qty: 1

## 2021-11-01 MED FILL — ROBAXIN 100 MG/ML INJECTION SOLUTION: 100 100 mg/mL | INTRAMUSCULAR | Qty: 10

## 2021-11-01 MED FILL — ATORVASTATIN 40 MG TABLET: 40 40 MG | ORAL | Qty: 1

## 2021-11-01 MED FILL — TYLENOL 325 MG TABLET: 325 325 mg | ORAL | Qty: 2

## 2021-11-01 MED FILL — BACLOFEN 10 MG TABLET: 10 10 MG | ORAL | Qty: 1

## 2021-11-01 MED FILL — CETIRIZINE 10 MG TABLET: 10 10 MG | ORAL | Qty: 1

## 2021-11-01 MED FILL — VARIBAR PUDDING 40 % (W/V), 30% (W/W) ORAL PASTE: 40 40 % (w/v), 30% (w/w) | ORAL | Qty: 230

## 2021-11-01 MED FILL — VARIBAR THIN LIQUID 81 % (W/W) ORAL POWDER: 81 81 % (w/w) | ORAL | Qty: 5

## 2021-11-01 MED FILL — DOXEPIN 10 MG CAPSULE: 10 10 MG | ORAL | Qty: 1

## 2021-11-01 MED FILL — BRIMONIDINE 0.2 % EYE DROPS: 0.2 0.2 % | OPHTHALMIC | Qty: 5

## 2021-11-01 NOTE — Unmapped (Signed)
Speech Language Pathology  Clinical Swallow Assessment    Name: Matthew [name]  DOB: March 13, 1945  Attending Physician: Tempie Hoist, MD  Admission Diagnosis: CSF leak [G96.00]  Date: 11/01/2021  Reviewed Pertinent hospital course: Yes  Hospital Course SLP: 76 y/o male s/p C3-4 ACDF on 9/19 complicated by intraoperative CSF leak presents with progressive R neck swelling concerning for continued CSF leak. 9/30: underwent CSF leak repair and neck exploration.  Imaging: No recent chest imaging.  SLP Hx: 9/20: seen for bedside swallow evaluation with recs for regular diet with thin liquids.    Assessment: Patient presents with increased risk of oropharyngeal dysphagia secondary to C3-4 ACDF c/b CSF leak now s/p CSF leak repair and neck exploration. Cranial nerve exam is overall unremarkable, but laryngeal function exam is concerning for mild dysphonia (weak, rough, congested quality).  Clinical s/s dysphagia noted with thin liquids and puree, evidenced by throat clearing and ~3 swallows per bolus concerning for poor pharyngeal clearance. He denies globus sensation, but endorses pain with swallowing and frequent cough. Based on today's evaluation, recommend MBS study to further evaluate pharyngeal swallow function and determine most appropriate diet recs/dysphagia POC.  Plan:   - Diet recommendation: continue current diet pending MBS study   - Instrumental recommendation: MBS study  - SLP therapy recommendations TBD pending results of instrumental assessment    Orientation:  Person: Yes  Place: Yes  Time: Yes  Situation: Yes    Pain:  No report of pain    Aspiration Risk  Aspiration Risk Recommendations: Modified barium swallow study  Dysphagia Diagnosis: pending further imaging    Baseline Assessment  History of Intubation:  (for procedure)  Behavior/Cognition: Alert;Cooperative  Dentition: Adequate  Baseline diet: regular diet with thin liquids  Current diet: regular diet with thin liquids  Patient Positioning:  Upright in bed  Volitional Cough: Wet    Respiratory Status  Respiratory Status: O2 via nasual cannula    Cranial Nerve & Laryngeal Function Exam  CNV- Trigeminal: Within Functional Limits    CNVII - Facial : Within Functional Limits         CNX - Vagus: Impaired  Vocal quality: congested;weak    CNVXII - Hypoglossal Status: Within Functional Limits    Dentition and Hearing  Dentition: Adequate  Hearing Exceptions: None    Consistencies Assessed  Thin;Puree    Thin  # of Trials: 7  Presentation: Straw  Oral: Within functional limits  Pharyngeal: Throat clearing - immediate    Puree  # of Trials: 3  Presentation: Spoon  Oral: Within functional limits  Pharyngeal: Throat Clearing - Immediate    Patient and wife report eating french toast this morning, which he did not initially have any difficulty with. He reports over the course of the meal feeling that dysphagia/odynophagia symptoms worsened.     Patient and Family Education  Patient and Family Education: Patient educated on, instrumental procedure, Family educated on, role of SLP, current POC  Education response: Patient demonstrated understanding, Family demonstrated understanding    End of Session:  Patient was left in bed with call light within reach and all needs met.  Safety handoff completed with RN.     Sherene Sires, M.A, CCC-SLP  Speech Language Pathologist--Rehab Services    [facility]  MBSImP Certified Clinician    Time  Start Time: [phone]  Stop Time: 1050  Time Calculation (min): 20 min    Charges   $Clinical Swallow: 1 Procedure  Patient Class   Inpatient

## 2021-11-01 NOTE — Unmapped (Signed)
[facility]  DEPARTMENT OF NEUROSURGERY  INPATIENT PROGRESS NOTE  Matthew [name]  [id number]  [birth year]    HD: 3 POD: 1    Neurosurgery Attending: Motley, MD    Interval History:  - No acute events overnight  - Small neck swelling stable since weekend  - LD continues at 10cc/hr200    Meds:  Current Facility-Administered Medications   Medication Dose Frequency Provider Last Admin    acetaminophen  650 mg Q6H Vickie Epley, MD 650 mg at 11/01/21 1302    amLODIPine  5 mg Daily 0900 Leontine Locket, MD 5 mg at 11/01/21 0848    atorvastatin  40 mg Nightly (2100) Leontine Locket, MD 40 mg at 10/31/21 2020    baclofen  10 mg Daily PRN Leontine Locket, MD 10 mg at 11/01/21 0847    bisacodyL  10 mg Daily PRN Leontine Locket, MD      brimonidine  1 drop BID Leontine Locket, MD 1 drop at 11/01/21 1030    ceFAZolin (ANCEF) IVPB  1 g Q8H Leontine Locket, MD Stopped at 11/01/21 1134    cetirizine  10 mg Daily 0900 Leontine Locket, MD 10 mg at 11/01/21 0849    doxepin  10 mg Nightly (2100) Leontine Locket, MD 10 mg at 10/31/21 2020    finasteride  5 mg Daily 0900 Leontine Locket, MD 5 mg at 11/01/21 5409    gabapentin  600 mg TID Leontine Locket, MD 600 mg at 11/01/21 1303    latanoprost  1 drop Nightly (2100) Leontine Locket, MD 1 drop at 10/31/21 2020    methocarbamoL  1,000 mg Q8H Christiana Cornea, MD 1,000 mg at 11/01/21 0851    ondansetron  4 mg Q6H PRN Leontine Locket, MD 4 mg at 10/31/21 1445    oxyCODONE  5 mg Q4H PRN Christiana Cornea, MD      Or    oxyCODONE  10 mg Q4H PRN Cordelia Poche, MD 10 mg at 11/01/21 0847    senna-docusate  1 tablet BID Leontine Locket, MD 1 tablet at 11/01/21 0847    timolol  1 drop Q12H Leontine Locket, MD 1 drop at 11/01/21 1100       Examination:  Temp:  [98 F (36.7 C)-100.3 F (37.9 C)] 98.4 F (36.9 C)  Heart Rate:  [67-87] 82  Resp:  [12-27] 18  BP: (118-163)/(71-97) 136/95    General:   Wound: c/d/I, soft tissue bulging underneath incision smaller from  prior    Neurological  -GCS: 15  -Orientation: alert, oriented x 3 (person, place, and time)  -Language: speech fluent and appropriate  -Cranial Nerves: PERRL, EOMI, face symmetric, hearing intact, tongue midline   -Motor: FCCx4, full and symmetric strength and ROM    D EF EE HG IO   RUE:    5 5 5 5 5    LUE:     5 5 5 5 5     HF KE DF EHL PF   RLE: 5 5 5 5 5    LLE: 5 5 5 5 5   -Sensation: sensation intact to light touch and symmetric in bilateral upper and lower extremities  -Reflex: no hyperreflexia or clonus appreciated, Hoffman's absent BL, babinski downgoing BL  -Tone: No abnormality of tone appreciated in bilateral upper or lower extremities.  -Coordination:No bilateral upper extremity drift; no dysmetria on finger-to-nose or finger-nose-finger assessments.     Labs:  Na/K/Cl/CO2:  140/4.0/102/30 (10/02 0055)  WBC/Hgb/Hct/Plts:  11.8/11.6/34.2/147 (10/02 1610)       Imaging Review  CT neck showing multi lobulated prevertebral R neck fluid collection emanating from surgical site at C3-4 likely representing CSF leak.    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 Patient underwent a C3-4 ACDF on 09/19 with intraoperative CSF leak. Patient admitted now with concern for continued CSF leak.     PLAN:  - Admitted to nsgy; Dr. Vella Redhead attending  - LD@10  cc/hr for wound healing.   - HOB >45 degress at all tiems  - pre-op labs, coags, marked and consented  - continue home meds post-op  - pain control  - bowel reg  - diet regular  - DVT prophylaxis: SCDs, [facility] pending LD?  - Dispo: pending recovery    This note was copied forward from the note written by Dr. Burnetta Sabin.  I have reviewed and updated the history, physical exam, data, assessment and plan of the note so it reflects the evaluation and management of the patient on today's date.     Please page [phone] with any questions or  concerns.     Shaunda Tipping L. Charlesetta Garibaldi, MD   Neurosurgery Resident   4:43 PM 11/01/2021

## 2021-11-01 NOTE — Unmapped (Deleted)
Patient is progressing in all aspects of care plan.

## 2021-11-01 NOTE — Unmapped (Signed)
Problem: Acute Pain  Description: Patient's pain progressing toward patient's stated pain goal  Goal: Patient will manage pain with the appropriate technique/intervention  Description: Assess and monitor patient's pain using appropriate pain scale. Collaborate with interdisciplinary team and initiate plan and interventions as ordered.  Re-assess patient's pain level 30-60 minutes after pain management intervention.  11/01/2021 1203 by Kyra Leyland, RN  Outcome: Progressing  11/01/2021 1201 by Kyra Leyland, RN  Outcome: Progressing  11/01/2021 1200 by Kyra Leyland, RN  Outcome: Progressing  11/01/2021 1159 by Kyra Leyland, RN  Outcome: Progressing  Goal: Patient will reduce or eliminate use of analgesics  11/01/2021 1203 by Kyra Leyland, RN  Outcome: Progressing  11/01/2021 1201 by Kyra Leyland, RN  Outcome: Progressing  11/01/2021 1200 by Kyra Leyland, RN  Outcome: Progressing  11/01/2021 1159 by Kyra Leyland, RN  Outcome: Progressing  Goal: Patients pain is managed to allow active participation in daily activities  11/01/2021 1203 by Kyra Leyland, RN  Outcome: Progressing  11/01/2021 1201 by Kyra Leyland, RN  Outcome: Progressing  11/01/2021 1200 by Kyra Leyland, RN  Outcome: Progressing  11/01/2021 1159 by Kyra Leyland, RN  Outcome: Progressing  Goal: Patient verbalizes a reduction in pain level  11/01/2021 1203 by Kyra Leyland, RN  Outcome: Progressing  11/01/2021 1201 by Kyra Leyland, RN  Outcome: Progressing  11/01/2021 1200 by Kyra Leyland, RN  Outcome: Progressing  11/01/2021 1159 by Kyra Leyland, RN  Outcome: Progressing     Problem: Safety  Goal: Patient will be injury free during hospitalization  Description: Assess and monitor vitals signs, neurological status including level of consciousness and orientation. Assess patient's risk for falls and implement fall prevention plan of care and interventions per hospital policy.      Ensure arm band on, uncluttered walking paths in room, adequate room lighting, call light and  overbed table within reach, bed in low position, wheels locked, side rails up per policy, and non-skid footwear provided.   11/01/2021 1203 by Kyra Leyland, RN  Outcome: Progressing  11/01/2021 1201 by Kyra Leyland, RN  Outcome: Progressing  11/01/2021 1200 by Kyra Leyland, RN  Outcome: Progressing  11/01/2021 1159 by Kyra Leyland, RN  Outcome: Progressing  Goal: Patient with weight > 350lbs will have appropriate equipment  Description: Consider ordering Bariatric Bed, Chair and Bedside Commode for patient weight > 350 lbs.  11/01/2021 1203 by Kyra Leyland, RN  Outcome: Progressing  11/01/2021 1201 by Kyra Leyland, RN  Outcome: Progressing  11/01/2021 1200 by Kyra Leyland, RN  Outcome: Progressing  11/01/2021 1159 by Kyra Leyland, RN  Outcome: Progressing     Problem: Patient will remain free of falls  Goal: Universal Fall Precautions  11/01/2021 1203 by Kyra Leyland, RN  Outcome: Progressing  11/01/2021 1201 by Kyra Leyland, RN  Outcome: Progressing  11/01/2021 1200 by Kyra Leyland, RN  Outcome: Progressing  11/01/2021 1159 by Kyra Leyland, RN  Outcome: Progressing     Problem: Daily Care  Goal: Daily care needs are met  Description: Assess and monitor ability to perform self care and identify potential discharge needs.  11/01/2021 1203 by Kyra Leyland, RN  Outcome: Progressing  11/01/2021 1201 by Kyra Leyland, RN  Outcome: Progressing  11/01/2021 1200 by Kyra Leyland, RN  Outcome: Progressing  11/01/2021 1159 by Kyra Leyland, RN  Outcome: Progressing     Problem: Psychosocial Needs  Goal: Demonstrates ability to cope with hospitalization/illness  Description: Assess and monitor patients ability to cope with his/her illness.  11/01/2021 1203 by Sherilyn Cooter  Ilsa IhaSnyder, RN  Outcome: Progressing  11/01/2021 1201 by Kyra LeylandHenry Keidra Withers, RN  Outcome: Progressing  11/01/2021 1200 by Kyra LeylandHenry Breanna Mcdaniel, RN  Outcome: Progressing  11/01/2021 1159 by Kyra LeylandHenry Janson Lamar, RN  Outcome: Progressing  Goal: Collaborate with patient/family to identify patient's goals  11/01/2021  1203 by Kyra LeylandHenry Tearsa Kowalewski, RN  Outcome: Progressing  11/01/2021 1201 by Kyra LeylandHenry Macsen Nuttall, RN  Outcome: Progressing  11/01/2021 1200 by Kyra LeylandHenry Haakon Titsworth, RN  Outcome: Progressing  11/01/2021 1159 by Kyra LeylandHenry Trayson Stitely, RN  Outcome: Progressing     Problem: Discharge Barriers  Goal: Patient's discharge needs are met  Description: Collaborate with interdisciplinary team and initiate plans and interventions as needed.   11/01/2021 1203 by Kyra LeylandHenry Annabel Gibeau, RN  Outcome: Progressing  11/01/2021 1201 by Kyra LeylandHenry Alexandrea Westergard, RN  Outcome: Progressing  11/01/2021 1200 by Kyra LeylandHenry Taitum Alms, RN  Outcome: Progressing  11/01/2021 1159 by Kyra LeylandHenry Elsie Baynes, RN  Outcome: Progressing     Problem: Patient will remain free of injury d/t fall  Goal: High Fall Risk Precautions  11/01/2021 1203 by Kyra LeylandHenry Ruston Fedora, RN  Outcome: Progressing  11/01/2021 1201 by Kyra LeylandHenry Oretha Weismann, RN  Outcome: Progressing  11/01/2021 1200 by Kyra LeylandHenry Bettie Swavely, RN  Outcome: Progressing  11/01/2021 1159 by Kyra LeylandHenry Taelon Bendorf, RN  Outcome: Progressing  Goal: Additional High Fall Risk Precautions (consider the following)  11/01/2021 1203 by Kyra LeylandHenry Steward Sames, RN  Outcome: Progressing  11/01/2021 1201 by Kyra LeylandHenry Mayda Shippee, RN  Outcome: Progressing  11/01/2021 1200 by Kyra LeylandHenry Randel Hargens, RN  Outcome: Progressing  11/01/2021 1159 by Kyra LeylandHenry Abigail Marsiglia, RN  Outcome: Progressing

## 2021-11-01 NOTE — Unmapped (Signed)
[place]  Care Management/Social Work Assessment      Patient Information     Patient Name: Matthew [name]  MRN: [id number]  Hospital Day: 3  Inpatient/Observation: Inpatient  Admit Date: 10/29/2021  Admission Diagnosis: CSF leak [G96.00]  Attending provider: Tempie Hoist, MD    PCP: Demetrius Charity, MD  Home Pharmacy:              [phone] - [place], [place] - [address]  [address] [facility]  [place] [address]  Phone: [phone]     [facility]  [address]  [place] [address]  Phone: [phone]        Pertinent Medications  New Diabetic: No      Issues related to obtaining medications: none reported    Payor Information   Medical Insurance Coverage:  Payor: MEDICARE / Plan: MEDICARE A AND B / Product Type: Medicare /   Secondary Payor: AARP supplement Plan G    Functional Assessment   Functional Assessment  Assessment Information Obtained From:: Patient, Spouse, Chart Review  Current Mental Status: Awake, Oriented to Person, Oriented to Place, Oriented to Time, Oriented to Situation  Mental Status Prior to Admission: Unable to Assess  Mental Health History: No  Suicide Attempts: No  Activities of Daily Living: Independent  Work History: Retired  Marital Status: Married  Number of children and their names: 2 adult: Matthew [name], and Matthew [name]  Relative Search Completed: No  Demographics Correct:: Yes  Discharge Destination: Home    Current Living Arrangements   Current Living Arrangements  Current Living Arrangements: Home  Type of Housing: Apartment  Who do you live with?: With Family  What family member?: spouse and 64 yo son  One Story or Two (check all that apply): One Financial planner the number of steps and rails to enter the residence: none, elevator access to apt  Enter the number of steps and rails inside the residence: one level    Electrical engineer at Home: DME  DME Current: Paediatric nurse,  Rolling walker  Was any abuse reported by patient?: No    Support Systems   Emergency contact: Extended Emergency Contact Information  Primary Emergency Contact: [facility]  Address: [address] [place], [address]           [place], [place] [address] [place]  Home Phone: [phone]  Relation: Spouse  Secondary Emergency Contact: Matthew [name]  Address: [address] [place], [place] [address] [place] Phone: [phone]  Relation: Daughter    Support Systems  Legal Status: HCPOA  Name of Guardian/POA/ Payee and Phone Number: Spouse, theres, Phuc Kluttz is legal NOK.  [phone]  Primary Caregiver: Self, Spouse  Times of available support: Total 24/7 hands on (add comment)  Marital Status: Married  Number of children and their names: 2 adult: Matthew [name], and Matthew [name]  Relative Search Completed: No  Demographics Correct:: Yes  Discharge Destination: Home  Next of Kin: Doniven Vanpatten, spouse  Next of Kin Relationship: Spouse  Next of Kin Phone Number: [phone]  Assessment Information Obtained From:: Patient, Spouse, Chart Review    Other Pertinent Information     Assessment completed from chart review as well as met with patient and spouse at bedside to confirm.  Patient is pending closing on property in [place],  [place] ( [place] code [address]), and was planning to move in about 1-2 months.  Spouse advises she would prefer to do out patient therapy since theyhave all their belongings in their apt packed up in boxes, etc.     Spouse, Mordecai Maes,  Giovany Cosby is legal NOK. She reports that they have a HCPOA, CM requested copy of document , she advised they are planning to move to [facility] in 2 months so she may not provide a copy.       Advance Directives (For Healthcare)  Advance Directive: Patient has advance directive, copy not in chart  Advance Directive not in Chart: Copy requested from family  member  Healthcare Agent Appointed: Yes  Pre-existing DNR/DNI Order: No  Patient Requests Assistance: No    Discharge Plan     Patient returns with CSF leak.  Lumbar drain in place, on Oxygen per N/C currently.     Met with patient  and spouse at bedside to initiate discussion regarding discharge planning. Introduced self and role of Matthew management/social work and provided Tour manager.  Patient is independent at baseline, no HHC, has a shower chair and a rolling walker, form recent hospital admission, discharged home 10/20/2021 after  C3-4 ACDF cleared for home with no CC needs; patient has  no home Oxygen, is not a veteran, no S/A, S/I or substance abuse issues reported, has a CPAP but is not using currently and may need a new sleep study per spouse's report.    Spouse and adult son can provide 24 hour S/A at discharge as needed. Patient was driving PTA, and participating in a balance clinic in [facility]. Spouse reports he is very diligent about maintaining his exercise routine.      PTOT recommending home PT, OT. SLP evaluated, recommended a MBS, currently on a regular diet.        Anticipated Discharge Plan: home with out patient PT, OT and possibly SLP if indicated by discharge    Anticipated Discharge Date: end of the week pending medical clearance    Anticipated Transportation: spouse/family    Patient/Family aware and taking part in the discharge plan.  Patient/family educated that once post-acute care needs have been identified, a provider list applicable to the identified post-acute care needs as well as the insurance provider will be provided, and patient/family have the freedom to choose their provider(s); financial interest(s) are disclosed as appropriate.    Elliot Dally RN CCM  [phone]

## 2021-11-01 NOTE — Unmapped (Signed)
Speech Language Pathology  Modified Barium Swallow Study  Name: Matthew [name]  DOB: 1945-12-22  Attending Physician: Matthew Hoist, MD  Admission Diagnosis: CSF leak [G96.00]  Date: 11/01/2021  Reviewed Pertinent hospital course: Yes  Hospital Course SLP: 76 y/o male s/p C3-4 ACDF on 9/19 complicated by intraoperative CSF leak presents with progressive R neck swelling concerning for continued CSF leak. 9/30: underwent CSF leak repair and neck exploration.  Imaging: No recent chest imaging.  SLP Hx: 9/20: seen for bedside swallow evaluation with recs for regular diet with thin liquids.    Assessment: Patient presents with oropharyngeal dysphagia secondary to reduced hyolaryngeal elevation, reduced epiglottic inversion, reduced BOT retraction, and reduced pharyngeal stripping wave, complicated by comorbidities and weak cough resulting in observed laryngeal penetration, silent aspiration, and overt aspiration of thin liquids, nectar thickened liquids, and honey thickened liquids during and after the swallow. Spontaneous and cued cough ineffective in clearing trachea or laryngeal vestibule, complicated by weak cough and patient reporting pain with hard cough. Strategies trialed were restricted due to patient's limited neck mobility and range of motion. R head turn ineffective in preventing aspiration. Small bolus size of puree resulted in penetration, but required sequential swallow to clear majority of penetrated material. Onset of swallow initiation timely, most consistently occurring at the posterior angle of ramus. Moderate pharyngeal post-swallow residue with all consistencies observed in the valleculae and pyriform sinuses. Of note, patient endorsed burning sensation in throat during study. Based on today's assessment, recommend NPO with consideration for short-term alternative means of nutrition. Patient is at an increased risk of aspiration.    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    Environmental allergies     Hearing loss     left (since age 66), right more recent    High cholesterol     Hypertension     Neck symptom     Trouble turning head to the left     Sleep apnea     Sleep disorder        Behavior/Cognition: Alert, Cooperative  Respiratory Status: O2 via nasual cannula  Dentition: Adequate  Previous Swallowing Assessments: BSE  Dates/ Results: 10/2 R/T    Orientation:  Person: Yes  Place: Yes  Time: Yes  Situation: Yes    Pain:  Pain Score: 9   Pain Location: Abdomen  Pain Descriptors: Aching;Discomfort  Pain Intervention(s): Medication (See eMAR);Rest;Repositioned      Trials Administered & Pen Asp Scale Score  Pen Asp   Score            Description of Events  1.            Material does not enter airway  2.            Material enters the airway, remains above the vocal folds,                  and is ejected from the  airway.  3.            Material enters the airway, remains above the vocal folds,                  and is not ejected from the airway.  4             Material enters the airway, contacts the vocal folds,                  and is ejected from the airway.  5.            Material enters the airway, contacts the vocal folds,                  and is not ejected from the airway.  6.            Material enters the airway, passes below the vocal folds,                  and is ejected into the larynx or out of the airway.  7.            Material enters the airway, passes below the vocal folds,                 and is not ejected from the  trachea despite effort.  8.           Material enters the airway, passes below the vocal folds,                 and no effort is made to eject.      Thin  Order of Trials: 1-3  Pen/Asp Scale Score: MAX: 8  Pen/Asp Scale Score: MOST: 7  Presentation: Spoon    Nectar Thick  Order of Trials: 4  Pen/Asp Scale Score: MAX: 7  Pen/Asp Scale Score: MOST: 7  Presentation: Spoon    Honey Thick  Order of Trials: 5  Pen/Asp Scale Score: MAX: 8  Pen/Asp Scale Score: MOST: 8  Presentation: Spoon    Puree  Order of Trials: 6  Pen/Asp Scale Score: 3  Presentation: Spoon    Oral Phase  Lip Closure: no labial escape  Tongue Control/Bolus Hold: adequate bolus control  Oral Residue: complete oral clearance  Onset of Pharyngeal Swallow: bolus head at posterior angle of ramus    Pharyngeal Phase  Soft Palate Elevation: no bolus between soft palate (SP) /pharyngeal wall (PW)  Base of Tongue Retraction: reduced  Hyolaryngeal Elevation/Excursion: reduced  Epiglottic Movement: no inversion  Laryngeal Vestibule Closure: incomplete  Pharyngeal Contraction: reduced  Pharyngeal Residue: residue within/ on pharyngeal structures  Laryngeal Penetration: observed  Laryngeal Penetration- Observed: during swallow initiation, after swallow initiation  Aspiration: observed  Aspiration - Observed: silent, overt, during swallow initiation, after swallow initiation, spontaneous cough ineffective, cued cough ineffective  Compensatory Strategies Trialed: R head turn    Goals  Goals Short Term Dysphagia  Patient will demonstrate use of swallowing exercises: for laryngeal closure and pharyngeal constriction with 90% acc given min cues.  [name] will demonstrate understanding of clinical signs and symptoms of aspiration at 80% accuracy: given min cues.  [name] will demonstrate understanding of risks associated with aspiration at 100% accuracy: given min cues.  Time frame for goals to be met in: 10/13    Goals Long Term  Dysphagia  Patient will tolerate the least restrictive diet as determined via instrumental assessment with 100%  accuracy independently: with 100% acc independently.  Time frame for goals to be met in: 10/20    Patient and Family Education  Patient and Family Education: Patient educated on, role of SLP, risks of aspiration, results of instrumental examination, current POC, swallowing anatomy & physiology, current diet recommendations, dysphagia, instrumental procedure  Education response: Patient demonstrated understanding    Barth Kirks, CFY-SLP  Speech Language Pathology  Rehab Services  [phone]    Time  Start Time: 1545  Stop Time: 1614  Time Calculation (min): 29 min    Charges   $ MBS Flouro Eval: 1 Procedure    Patient Class   Inpatient

## 2021-11-02 ENCOUNTER — Inpatient Hospital Stay: Payer: MEDICARE

## 2021-11-02 ENCOUNTER — Inpatient Hospital Stay: Admit: 2021-11-02 | Payer: MEDICARE

## 2021-11-02 LAB — RENAL FUNCTION PANEL W/EGFR
Albumin: 4.2 g/dL (ref 3.5–5.7)
Anion Gap: 12 mmol/L (ref 3–16)
BUN: 16 mg/dL (ref 7–25)
CO2: 28 mmol/L (ref 21–33)
Calcium: 9.4 mg/dL (ref 8.6–10.3)
Chloride: 98 mmol/L (ref 98–110)
Creatinine: 0.75 mg/dL (ref 0.60–1.30)
EGFR: >90
Glucose: 157 mg/dL (ref 70–100)
Osmolality, Calculated: 290 mOsm/kg (ref 278–305)
Phosphorus: 2.3 mg/dL (ref 2.1–4.5)
Potassium: 3.9 mmol/L (ref 3.5–5.3)
Sodium: 138 mmol/L (ref 133–146)

## 2021-11-02 LAB — MAGNESIUM: Magnesium: 2 mg/dL (ref 1.5–2.5)

## 2021-11-02 LAB — CBC
Hematocrit: 34.5 % (ref 38.5–50.0)
Hemoglobin: 12.1 g/dL (ref 13.2–17.1)
MCH: 32.8 pg (ref 27.0–33.0)
MCHC: 35.1 g/dL (ref 32.0–36.0)
MCV: 93.5 fL (ref 80.0–100.0)
MPV: 8.2 fL (ref 7.5–11.5)
Platelets: 158 10*3/uL (ref 140–400)
RBC: 3.69 10*6/uL (ref 4.20–5.80)
RDW: 13.1 % (ref 11.0–15.0)
WBC: 16.6 10*3/uL (ref 3.8–10.8)

## 2021-11-02 MED FILL — ROBAXIN 100 MG/ML INJECTION SOLUTION: 100 100 mg/mL | INTRAMUSCULAR | Qty: 10

## 2021-11-02 MED FILL — OXYCODONE 5 MG TABLET: 5 5 MG | ORAL | Qty: 2

## 2021-11-02 MED FILL — HEPARIN (PORCINE) 5,000 UNIT/ML INJECTION SOLUTION: 5000 5,000 unit/mL | INTRAMUSCULAR | Qty: 1

## 2021-11-02 MED FILL — HYDROMORPHONE 1 MG/ML INJECTION SYRINGE: 1 1 mg/mL | INTRAMUSCULAR | Qty: 1

## 2021-11-02 MED FILL — ONDANSETRON HCL (PF) 4 MG/2 ML INJECTION SOLUTION: 4 4 mg/2 mL | INTRAMUSCULAR | Qty: 2

## 2021-11-02 MED FILL — ATORVASTATIN 40 MG TABLET: 40 40 MG | ORAL | Qty: 1

## 2021-11-02 MED FILL — DOXEPIN 10 MG CAPSULE: 10 10 MG | ORAL | Qty: 1

## 2021-11-02 MED FILL — GABAPENTIN 300 MG CAPSULE: 300 300 MG | ORAL | Qty: 2

## 2021-11-02 MED FILL — SENNOSIDES 8.6 MG-DOCUSATE SODIUM 50 MG TABLET: 8.6-50 8.6-50 mg | ORAL | Qty: 1

## 2021-11-02 MED FILL — HYDROMORPHONE 0.5 MG/0.5 ML INJECTION SYRINGE: 0.5 0.5 mg/0.5 mL | INTRAMUSCULAR | Qty: 0.5

## 2021-11-02 MED FILL — TYLENOL 325 MG TABLET: 325 325 mg | ORAL | Qty: 2

## 2021-11-02 NOTE — Unmapped (Signed)
[facility]  DEPARTMENT OF NEUROSURGERY  INPATIENT PROGRESS NOTE  Matthew MacleodRobert [name]  [id number]  07-Mar-1945    HD: 4 POD: 2    Neurosurgery Attending: Motley, MD    Interval History:  - No acute events overnight  - Failed MBS - will repeat today; potential NGT  - LD continues at 5cc hr     Meds:  Current Facility-Administered Medications   Medication Dose Frequency Provider Last Admin    acetaminophen  650 mg Q6H Vickie EpleyNatalie Ivey, MD 650 mg at 11/01/21 1302    amLODIPine  5 mg Daily 0900 Leontine LocketSanjit J Shah, MD 5 mg at 11/01/21 0848    atorvastatin  40 mg Nightly (2100) Leontine LocketSanjit J Shah, MD 40 mg at 10/31/21 2020    baclofen  10 mg Daily PRN Leontine LocketSanjit J Shah, MD 10 mg at 11/01/21 0847    bisacodyL  10 mg Daily PRN Leontine LocketSanjit J Shah, MD      brimonidine  1 drop BID Leontine LocketSanjit J Shah, MD 1 drop at 11/01/21 2012    cetirizine  10 mg Daily 0900 Leontine LocketSanjit J Shah, MD 10 mg at 11/01/21 0849    doxepin  10 mg Nightly (2100) Leontine LocketSanjit J Shah, MD 10 mg at 10/31/21 2020    electrolyte  75 mL/hr Continuous Clarise Cruzebecca Garner, MD 75 mL/hr at 11/01/21 1818    finasteride  5 mg Daily 0900 Leontine LocketSanjit J Shah, MD 5 mg at 11/01/21 [id number]    gabapentin  600 mg TID Leontine LocketSanjit J Shah, MD 600 mg at 11/01/21 1303    heparin  5,000 Units 3 times per day Vickie EpleyNatalie Ivey, MD 5,000 Units at 11/02/21 0354    HYDROmorphone  0.5 mg Q4H PRN Clarise Cruzebecca Garner, MD      Or    HYDROmorphone  1 mg Q4H PRN Clarise Cruzebecca Garner, MD 1 mg at 11/02/21 0358    latanoprost  1 drop Nightly (2100) Leontine LocketSanjit J Shah, MD 1 drop at 11/01/21 2012    methocarbamoL  1,000 mg Q8H Christiana Cornea, MD 1,000 mg at 11/01/21 2355    ondansetron  4 mg Q6H PRN Leontine LocketSanjit J Shah, MD 4 mg at 11/02/21 [id number]    oxyCODONE  5 mg Q4H PRN Christiana Cornea, MD      Or    oxyCODONE  10 mg Q4H PRN Cordelia Pochehristiana Cornea, MD 10 mg at 11/01/21 0847    senna-docusate  1 tablet BID Leontine LocketSanjit J Shah, MD 1 tablet at 11/01/21 0847    timolol  1 drop Q12H Leontine LocketSanjit J Shah, MD 1 drop at 11/01/21 2242       Examination:  Temp:  [97.5 F  (36.4 C)-98.4 F (36.9 C)] 98.2 F (36.8 C)  Heart Rate:  [63-98] 88  Resp:  [11-27] 19  BP: (118-171)/(71-95) 166/94    General:   Wound: c/d/I, small superficial hematoma near incision     Neurological  -GCS: 15  -Orientation: alert, oriented x 3 (person, place, and time)  -Language: speech fluent and appropriate  -Cranial Nerves: PERRL, EOMI, face symmetric, hearing intact, tongue midline   -Motor: FCCx4, full and symmetric strength and ROM    D EF EE HG IO   RUE:    5 5 5 5 5    LUE:     5 5 5 5 5     HF KE DF EHL PF   RLE: 5 5 5 5 5    LLE: 5 5 5 5 5   -Sensation: sensation intact to light touch  and symmetric in bilateral upper and lower extremities  -Reflex: no hyperreflexia or clonus appreciated, Hoffman's absent BL, babinski downgoing BL  -Tone: No abnormality of tone appreciated in bilateral upper or lower extremities.  -Coordination:No bilateral upper extremity drift; no dysmetria on finger-to-nose or finger-nose-finger assessments.     Labs:  Na/K/Cl/CO2:  138/3.9/98/28 (10/02 2356)  WBC/Hgb/Hct/Plts:  16.6/12.1/34.5/158 (10/02 2356)       Imaging Review  CT neck showing multi lobulated prevertebral R neck fluid collection emanating from surgical site at C3-4 likely representing CSF leak.    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 Patient underwent a C3-4 ACDF on 09/19 with intraoperative CSF leak. Patient admitted now with concern for continued CSF leak.     PLAN:  - Admitted to nsgy; Dr. Vella Redhead attending  - LD@5  cc/hr for wound healing.   - HOB >45 degress at all tiems  - continue home meds post-op  - pain control  - bowel reg  -NPO pending speech eval; possible NGT   -Hospital dx:    *Dysphagia; due to hematoma; see SLP eval for full rec's   - diet regular  - DVT prophylaxis: SCDs [facility]   - Dispo: pending recovery    This note was copied forward from  the note written by Dr. Charlesetta Garibaldi.  I have reviewed and updated the history, physical exam, data, assessment and plan of the note so it reflects the evaluation and management of the patient on today's date.     Please page [phone] with any questions or concerns.     Derrill Center, APRN  Neurosurgery  Pager# [phone]  Please Page NS Day pager at Pager [phone] if unable to reach  11/02/2021  8:26 AM

## 2021-11-02 NOTE — Unmapped (Signed)
Speech Language Pathology  Dysphagia Progress Note      Name: Matthew MacleodRobert [name]  DOB: Mar 23, 1945  Attending Physician: Tempie HoistBenjamin Motley, MD  Admission Diagnosis: CSF leak [G96.00]  Date: 11/02/2021    Reviewed Pertinent hospital course: Yes  Hospital Course SLP: 76 y/o male s/p C3-4 ACDF on 9/19 complicated by intraoperative CSF leak presents with progressive R neck swelling concerning for continued CSF leak. 9/30: underwent CSF leak repair and neck exploration.  Imaging: No recent chest imaging.  SLP Hx: 9/20: seen for bedside swallow evaluation with recs for regular diet with thin liquids.    Assessment: Patient presents with oropharyngeal dysphagia secondary to reduced hyolaryngeal elevation, reduced epiglottic inversion, reduced BOT retraction, and reduced pharyngeal stripping wave, complicated by comorbidities and weak cough resulting in observed laryngeal penetration, silent aspiration, and overt aspiration of thin liquids, nectar thickened liquids, and honey thickened liquids during and after the swallow evidenced by MBS study 10/2. This date, patient continues to present with coughing/choking with PO trials. Reviewed MBS study results with patient and wife and discussed how repeating the exam now would likely not yield different results. Would likely be appropriate for repeat instrumental later this week. Education provided re: importance of frequent oral care and introduced dysphagia exercises. Continue to recommend NPO with consideration for short term alternative means of nutrition. Ice chips okay one at a time, excellent oral care 3x per day.     Plan/Recommendation  ST while in house: recommended  ST while in house frequency: 1-3x/week  ST at discharge: recommended  Recommended Solid for Diet: no solid consistency  Recommended Liquid for Diet: no liquid consistency  Medication Administration: feeding tube      Background/Subjective: Patient alert, oriented to self (DNT further). No report of pain.  Primary team requesting re-evaluation prior to NG tube placement.     Objective:  Goals  Goals Short Term Dysphagia  Patient will demonstrate use of swallowing exercises: for laryngeal closure and pharyngeal constriction with 90% acc given min cues.  >>In progress. Introduced effortful swallow, patient completed 10x with min cues. Handout provided.   [name] will demonstrate understanding of clinical signs and symptoms of aspiration at 80% accuracy: given min cues.  >>In progress. Discussed silent and overt aspiration observed on MBS study. Patient and wife demonstrate comprehension re: s/s aspiration. Will reinforce 1-2x.  [name] will demonstrate understanding of risks associated with aspiration at 100% accuracy: given min cues.  >>In progress. Reviewed risks associated with aspiration with patient and wife. Wife verbalizes comprehension, will continue to reinforce.   Time frame for goals to be met in: 10/13    Goals Long Term Dysphagia  Patient will tolerate the least restrictive diet as determined via instrumental assessment with 100% accuracy independently: with 100% acc independently.  >>Indirectly addressed. Intermittent throat clearing noted with ice chips x6, immediate cough/choking observed with thin liquid via cup sip. Reviewed images from yesterday's MBS with patient's wife. Patient stated fearfulness regarding any PO consumption including ice chips. Discussed not repeating MBS study at this time as it would not likely yield different results, but could be appropriate in the next few days.   Time frame for goals to be met in: 10/20    Patient and Family Education  Patient and Family Education: Patient educated on, role of SLP, risks of aspiration, results of instrumental examination, current POC, swallowing anatomy & physiology, current diet recommendations, dysphagia, instrumental procedure  Education response: Patient demonstrated understanding    End of  Session:  Patient was left in bed with call light within reach and all needs met.  Safety handoff completed with RN.     Sherene Sires, M.A, CCC-SLP  Speech Language Pathologist--Rehab Services    [facility]  MBSImP Certified Clinician      Time  Start Time: [phone]  Stop Time: 1200  Time Calculation (min): 25 min    Charges      $Swallow Tx : 1 Procedure

## 2021-11-02 NOTE — Unmapped (Signed)
Problem: Acute Pain  Description: Patient's pain progressing toward patient's stated pain goal  Goal: Patient will manage pain with the appropriate technique/intervention  Description: Assess and monitor patient's pain using appropriate pain scale. Collaborate with interdisciplinary team and initiate plan and interventions as ordered.  Re-assess patient's pain level 30-60 minutes after pain management intervention.  Outcome: Progressing     Problem: Safety  Goal: Patient will be injury free during hospitalization  Description: Assess and monitor vitals signs, neurological status including level of consciousness and orientation. Assess patient's risk for falls and implement fall prevention plan of care and interventions per hospital policy.      Ensure arm band on, uncluttered walking paths in room, adequate room lighting, call light and overbed table within reach, bed in low position, wheels locked, side rails up per policy, and non-skid footwear provided.   Outcome: Progressing     Problem: Patient will remain free of falls  Goal: Universal Fall Precautions  Outcome: Progressing     Problem: Daily Care  Goal: Daily care needs are met  Description: Assess and monitor ability to perform self care and identify potential discharge needs.  Outcome: Progressing

## 2021-11-02 NOTE — Unmapped (Signed)
[facility]  DIVISION OF OTOLARYNGOLOGY- HEAD & NECK SURGERY  GENERAL INPATIENT PROGRESS NOTE      ASSESSMENT  Matthew [name] is a 76 y.o. male s/p C3-4 ACDF on 9/19 complicated by intraoperative CSF leak who presented with a CSF leak into the neck and parapharyngeal space. Patient is s/p CSF leak repair and neck exploration on 9/30.     PLAN  - Admitted to NSGY  - No acute ENT intervention   - Routine incision care  - Page ENT stat if any concerns for impending respiratory distress    SUBJECTIVE  Patient breathing comfortably on RA. Denies dyspnea. Speaking clearly, albeit mildly confused this morning (answers questions appropriately but confused). Patient has been reporting dysphagia. Was seen by SLP yesterday who recommended MBS which showed aspiration. Recommending npo; consider NGT placement today. Pt reports improved odynophagia this morning. ENT will continue to follow.     PHYSICAL EXAM  BP 163/88 (BP Location: Right upper arm, Patient Position: Lying, BP Cuff Size: Regular)   Pulse 103   Temp 98.2 F (36.8 C) (Oral)   Resp 19   Ht 5' 11 (1.803 m)   Wt 175 lb (79.4 kg)   SpO2 100%   BMI 24.41 kg/m     GENERAL: No Acute Distress, Alert and Oriented  EYES: EOMI, Anti-icteric  NOSE: No epistaxis  EARS: Normal auricles bilaterally  FACE: 1/6 House-Brackmann Scale  NECK: right neck incision intact  CHEST: Normal respiratory effort, no retractions. No stridor    LABORATORY  Past 24 hour labs:   Lab Results   Component Value Date    WBC 16.6 (H) 11/01/2021    HGB 12.1 (L) 11/01/2021    HCT 34.5 (L) 11/01/2021    MCV 93.5 11/01/2021    PLT 158 11/01/2021     Lab Results   Component Value Date    GLUCOSE 157 (H) 11/01/2021    BUN 16 11/01/2021    CREATININE 0.75 11/01/2021    K 3.9 11/01/2021    NA 138 11/01/2021    CL 98 11/01/2021    CALCIUM 9.4 11/01/2021     Lab Results   Component Value Date    MG 2.0 11/01/2021     Lab Results   Component Value Date    PHOS 2.3 11/01/2021      Lab Results   Component Value Date    ALKPHOS 73 10/19/2021    ALT 23 10/19/2021    AST 23 10/19/2021    BILITOT 0.7 10/19/2021    ALBUMIN 4.2 11/01/2021    PROT 7.1 10/19/2021     No results found for: LDH  No results found for: PTT  No results found for: AMYLASE  No results found for: LIPASE    RADIOLOGY  FL MODIFIED BARIUM SWALLOW, INC SCOUT/DELAYED IMAGES w SPEECH Therapy   Final Result   IMPRESSION:      Laryngeal penetration and tracheal aspiration of thin and thick liquids.    Residual pooling of contrast material with subsequent silent aspiration on each swallow, independent of barium thickness.       Please correlate with the speech pathology report for additional details.      Approved by Laurence Aly, MD on 11/01/2021 4:43 PM EDT      I have personally reviewed the images and I agree with this report.      Report Verified by: Norberta Keens, MD at 11/01/2021 4:59 PM EDT      CT  Neck WO contrast   Final Result   IMPRESSION:      1.  Mild interval decrease in size of the multilobulated prevertebral/right neck fluid collection arising from the C3-C4 surgical site.               Approved by Kate Sable, MD on 11/01/2021 4:22 PM EDT      I have personally reviewed the images and I agree with this report.      Report Verified by: Burt Ek, MD at 11/01/2021 4:39 PM EDT      X-ray Cervical Spine 2 or 3-views   Final Result   IMPRESSION:      1.  Prevertebral soft tissue swelling and asymmetric soft tissue swelling along the right neck status post recent neck dissection and CSF repair. This is indeterminate for postoperative fluid/sweeling or recurrent CSF leak which may be further evaluated on the pending CT neck.   2.  Postsurgical changes of C3-C4 ACDF. No acute hardware complication.         Approved by Herby Abraham, DO on 11/01/2021 3:51 PM EDT      I have personally reviewed the images and I agree with this report.      Report Verified by: Particia Nearing, MD at 11/01/2021 4:34 PM EDT      X-ray  Cervical Spine 2 or 3-views   Final Result   IMPRESSION:       Intraoperative fluoroscopy was performed. Correlate with procedural note for additional information.          Report Verified by: Shelba Flake, MD at 10/30/2021 2:51 PM EDT      Fluoro up to 1 hour   Final Result   IMPRESSION:       Intraoperative fluoroscopy was performed. Correlate with procedural note for additional information.          Report Verified by: Shelba Flake, MD at 10/30/2021 2:51 PM EDT      CT Neck WO contrast   Final Result   IMPRESSION:      1.  Nonspecific large multilobulated prevertebral/right neck fluid collection emanating from the surgical site at C3-C4. This could represent a postoperative seroma/serosanguineous collection though CSF leak and collection is a consideration given the reported intraoperative CSF leak.   2.  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               Report Verified by: Ezzie Dural, DO at 10/29/2021 9:47 PM EDT

## 2021-11-02 NOTE — Unmapped (Addendum)
[facility]  Case Management/Social Work Department  Progress Note    Patient Information     Patient Name: Matthew [name]  MRN: [id number]  Hospital day: 4  Inpatient/Observation:  Inpatient   Level of Care:  step down  Admit date:  10/29/2021  Admission diagnosis: CSF leak [G96.00]    PMH:  has a past medical history of Cancer (CMS-HCC), Environmental allergies, Hearing loss, High cholesterol, Hypertension, Neck symptom, Sleep apnea, and Sleep disorder.    PCP:  Demetrius Charity, MD    Home Pharmacy:    [facility] [address] [place] [facility] [address]  [address] [facility]  [place] [address]  Phone: [phone]     [facility]  [address]  [place] [address]  Phone: [phone]         Medical Insurance Coverage:  Payor: MEDICARE / Plan: MEDICARE A AND B / Product Type: Medicare /     Other Pertinent Information     RN Care Coordinator participated in interdisciplinary rounds with the MD team and completed chart review.  Patient is not medically ready today,  Failed MBS yesterday  recommending NPO with short term nutrition.  May need  NGT and home Infusion.    1530: spoke with patient and spouse at bedside, alerted to possibility of needing home infusion and HHC at discharge. Spouse verbalized understanding, but is hopeful his swallow will improve in a few days and not need the NGT at discharge.  CM reviewed the referral process, and the role of home infusion and Home care SN, verbalized understanding.  Wife is reluctantly  agreeable  to home infusion and Home PTOT SN if needed.  Wife has no preferences to [facility] agencies or Home infusion.     Referral sent to [facility], [facility] and  [facility] as part of discharge planning.     Discharge Plan     Anticipated discharge plan:  home with home infusion, skilled nursing pending feeding plan vs out patient PTOT, SLP      Anticipated discharge date:  weekend pending medical clearance     CM/SW will continue to follow and  remain available for discharge planning needs.      Elliot Dally RN CCM  [phone]

## 2021-11-02 NOTE — Unmapped (Signed)
Problem: Acute Pain  Description: Patient's pain progressing toward patient's stated pain goal  Goal: Patient will manage pain with the appropriate technique/intervention  Description: Assess and monitor patient's pain using appropriate pain scale. Collaborate with interdisciplinary team and initiate plan and interventions as ordered.  Re-assess patient's pain level 30-60 minutes after pain management intervention.  Outcome: Progressing  Goal: Patients pain is managed to allow active participation in daily activities  Outcome: Progressing  Goal: Patient verbalizes a reduction in pain level  Outcome: Progressing     Problem: Patient will remain free of injury d/t fall  Goal: High Fall Risk Precautions  Outcome: Progressing  Goal: Additional High Fall Risk Precautions (consider the following)  Outcome: Progressing

## 2021-11-03 ENCOUNTER — Ambulatory Visit: Payer: MEDICARE

## 2021-11-03 LAB — RENAL FUNCTION PANEL W/EGFR
Albumin: 3.7 g/dL (ref 3.5–5.7)
Anion Gap: 8 mmol/L (ref 3–16)
BUN: 18 mg/dL (ref 7–25)
CO2: 31 mmol/L (ref 21–33)
Calcium: 9.2 mg/dL (ref 8.6–10.3)
Chloride: 98 mmol/L (ref 98–110)
Creatinine: 0.67 mg/dL (ref 0.60–1.30)
EGFR: >90
Glucose: 113 mg/dL (ref 70–100)
Osmolality, Calculated: 287 mOsm/kg (ref 278–305)
Phosphorus: 2.7 mg/dL (ref 2.1–4.5)
Potassium: 4.1 mmol/L (ref 3.5–5.3)
Sodium: 137 mmol/L (ref 133–146)

## 2021-11-03 LAB — CBC
Hematocrit: 33.9 % (ref 38.5–50.0)
Hemoglobin: 11.7 g/dL (ref 13.2–17.1)
MCH: 32.5 pg (ref 27.0–33.0)
MCHC: 34.6 g/dL (ref 32.0–36.0)
MCV: 93.8 fL (ref 80.0–100.0)
MPV: 8.3 fL (ref 7.5–11.5)
Platelets: 145 10*3/uL (ref 140–400)
RBC: 3.62 10*6/uL (ref 4.20–5.80)
RDW: 12.9 % (ref 11.0–15.0)
WBC: 16.2 10*3/uL (ref 3.8–10.8)

## 2021-11-03 MED ORDER — AMOXicillin-clavulanate (AUGMENTIN) 500-125 mg per tablet 1 tablet
500-125 | Freq: Two times a day (BID) | ORAL
Start: 2021-11-03 — End: 2021-11-09
  Administered 2021-11-03 – 2021-11-09 (×13): 1 via ORAL

## 2021-11-03 MED FILL — AMOXICILLIN 500 MG-POTASSIUM CLAVULANATE 125 MG TABLET: 500-125 500-125 mg | ORAL | Qty: 1

## 2021-11-03 MED FILL — ROBAXIN 100 MG/ML INJECTION SOLUTION: 100 100 mg/mL | INTRAMUSCULAR | Qty: 10

## 2021-11-03 MED FILL — HEPARIN (PORCINE) 5,000 UNIT/ML INJECTION SOLUTION: 5000 5,000 unit/mL | INTRAMUSCULAR | Qty: 1

## 2021-11-03 MED FILL — CETIRIZINE 10 MG TABLET: 10 10 MG | ORAL | Qty: 1

## 2021-11-03 MED FILL — BISACODYL 10 MG RECTAL SUPPOSITORY: 10 10 mg | RECTAL | Qty: 1

## 2021-11-03 MED FILL — TYLENOL 325 MG TABLET: 325 325 mg | ORAL | Qty: 2

## 2021-11-03 MED FILL — AMLODIPINE 5 MG TABLET: 5 5 MG | ORAL | Qty: 1

## 2021-11-03 MED FILL — DOXEPIN 10 MG CAPSULE: 10 10 MG | ORAL | Qty: 1

## 2021-11-03 MED FILL — GABAPENTIN 300 MG CAPSULE: 300 300 MG | ORAL | Qty: 2

## 2021-11-03 MED FILL — SENNOSIDES 8.6 MG-DOCUSATE SODIUM 50 MG TABLET: 8.6-50 8.6-50 mg | ORAL | Qty: 1

## 2021-11-03 MED FILL — OXYCODONE 5 MG TABLET: 5 5 MG | ORAL | Qty: 2

## 2021-11-03 MED FILL — ATORVASTATIN 40 MG TABLET: 40 40 MG | ORAL | Qty: 1

## 2021-11-03 MED FILL — OXYCODONE 5 MG TABLET: 5 5 MG | ORAL | Qty: 1

## 2021-11-03 NOTE — Unmapped (Signed)
Problem: Acute Pain  Description: Patient's pain progressing toward patient's stated pain goal  Goal: Patient will manage pain with the appropriate technique/intervention  Description: Assess and monitor patient's pain using appropriate pain scale. Collaborate with interdisciplinary team and initiate plan and interventions as ordered.  Re-assess patient's pain level 30-60 minutes after pain management intervention.  Outcome: Progressing  Goal: Patient will reduce or eliminate use of analgesics  Outcome: Progressing  Goal: Patients pain is managed to allow active participation in daily activities  Outcome: Progressing  Goal: Patient verbalizes a reduction in pain level  Outcome: Progressing     Problem: Safety  Goal: Patient will be injury free during hospitalization  Description: Assess and monitor vitals signs, neurological status including level of consciousness and orientation. Assess patient's risk for falls and implement fall prevention plan of care and interventions per hospital policy.      Ensure arm band on, uncluttered walking paths in room, adequate room lighting, call light and overbed table within reach, bed in low position, wheels locked, side rails up per policy, and non-skid footwear provided.   Outcome: Progressing  Goal: Patient with weight > 350lbs will have appropriate equipment  Description: Consider ordering Bariatric Bed, Chair and Bedside Commode for patient weight > 350 lbs.  Outcome: Progressing     Problem: Patient will remain free of falls  Goal: Universal Fall Precautions  Outcome: Progressing     Problem: Daily Care  Goal: Daily care needs are met  Description: Assess and monitor ability to perform self care and identify potential discharge needs.  Outcome: Progressing     Problem: Psychosocial Needs  Goal: Demonstrates ability to cope with hospitalization/illness  Description: Assess and monitor patients ability to cope with his/her illness.  Outcome: Progressing  Goal: Collaborate  with patient/family to identify patient's goals  Outcome: Progressing     Problem: Discharge Barriers  Goal: Patient's discharge needs are met  Description: Collaborate with interdisciplinary team and initiate plans and interventions as needed.   Outcome: Progressing     Problem: Patient will remain free of injury d/t fall  Goal: High Fall Risk Precautions  Outcome: Progressing  Goal: Additional High Fall Risk Precautions (consider the following)  Outcome: Progressing

## 2021-11-03 NOTE — Unmapped (Signed)
[facility]  Medical Nutrition Therapy    Reason(s) for Completion: Physician/Nursing Referral    Diet Order/Nutrition Support: Regular; Peptamen AF @ 20-60 ml/hr; Water bolus of 250 ml every 4 hours    Chief Complaint: Postoperative CSF leak    Pertinent Information: Matthew [name] is a 76 y.o. 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 Patient underwent a C3-4 ACDF on 09/19 with intraoperative CSF leak. Patient admitted now with concern for continued CSF leak. S/p SLP eval on 10/03 with recommendations for NPO with consideration for short term alternative means of nutrition.     Nutrition consult received for initiation of EN. NG tube placed on 10/03 following SLP eval. Weight hx reviewed, overall stable. Pt previously ordered regular diet with documented PO intake of 80% of meals with fair appetite. Pt seen this morning at bedside. Endorses a good appetite/no nutrition-related questions/concerns at this time. Reports tolerance to Peptamen AF, infusing at 60 ml/hr at the time of visit.     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    Environmental allergies     Hearing loss     left (since age 56), right more recent    High cholesterol     Hypertension     Neck symptom     Trouble turning head to the left     Sleep apnea     Sleep disorder        Scheduled Meds:    acetaminophen  650 mg Oral Q6H     amLODIPine  5 mg Oral Daily 0900    atorvastatin  40 mg Oral Nightly (2100)    brimonidine  1 drop Right Eye BID    cetirizine  10 mg Oral Daily 0900    doxepin  10 mg Oral Nightly (2100)    finasteride  5 mg Oral Daily 0900    gabapentin  600 mg Oral TID    heparin  5,000 Units Subcutaneous 3 times per day    latanoprost  1 drop Ophthalmic Nightly (2100)    methocarbamoL  1,000 mg Intravenous Q8H    senna-docusate  1 tablet Oral BID    timolol  1 drop Both Eyes Q12H      Continuous Infusions:   electrolyte 75 mL/hr (11/01/21 1818)      PRN Meds:baclofen, bisacodyL, HYDROmorphone **OR** HYDROmorphone, ondansetron, oxyCODONE **OR** oxyCODONE     Pertinent Labs:   Lab Results   Component Value Date    CREATININE 0.67 11/03/2021    BUN 18 11/03/2021    NA 137 11/03/2021    K 4.1 11/03/2021    CL 98 11/03/2021    CO2 31 11/03/2021     Lab Results   Component Value Date    ALBUMIN  3.7 11/03/2021     Lab Results   Component Value Date    CALCIUM 9.2 11/03/2021    PHOS 2.7 11/03/2021     Lab Results   Component Value Date    MG 2.0 11/01/2021     Lab Results   Component Value Date    POCGMD 134 (H) 10/30/2021     Skin Integrity: Surgical incision, Neck (10/30/21)  Braden Score: 18 - Mild risk for skin breakdown  Edema: WDL    Potential Nutrition Related Factor(s):  Skin Integrity and Chewing/Swallowing Difficulty  Food Allergies/Intolerances: NKFA  Cultural Requests: None noted    76 y.o.   Male   Ht Readings from Last 1 Encounters:   10/30/21 5' 11 (1.803 m)     Wt Readings from Last 1 Encounters:   10/30/21 175 lb (79.4 kg)        Body mass index is 24.41 kg/m.   Usual Weight: See below  Ideal Body Weight: 172 lb (78 kg) +/- 10%  Other: 101% IBW  Weight History:   Wt Readings from Last 15 Encounters:   10/30/21 175 lb (79.4 kg)   10/29/21 171 lb (77.6 kg)   10/29/21 171 lb (77.6 kg)   10/27/21 174 lb (78.9 kg)   10/26/21 174 lb (78.9 kg)   10/19/21 175 lb (79.4 kg)   10/18/21 175 lb (79.4 kg)   10/14/21 179 lb 4.8 oz  (81.3 kg)   10/13/21 181 lb (82.1 kg)   09/17/21 178 lb 3.2 oz (80.8 kg)   09/08/21 179 lb 6.4 oz (81.4 kg)   08/05/21 180 lb 4.8 oz (81.8 kg)   06/25/21 179 lb 12.8 oz (81.6 kg)   01/15/21 184 lb 1.6 oz (83.5 kg)   11/19/20 172 lb (78 kg)     Estimated Nutrition Needs:   Needs based On: CBW (79 kg)  Kcals/day: 1975-2370 (25-30 kcal/kg)  Protein g/day: 95-103 (1.2-1.3 g/kg)  Carbohydrate g/day: No hx of DM  Fluid ml/day: ~1 ml/kcal or per MD    Nutrition Related Problems:   Nutrition Diagnosis: Inadequate oral intake  Related To: Dysphagia  As Evidenced By: Need for enteral nutrition    Recommended Interventions: Modify/Continue Enteral Nutrition  Goals:Tolerate enteral nutrition within 1-2 days    Nutrition Status Classification: Moderately Compromised    Discharge Planning and Education: Discharge plan of care for nutrition pending clinical course.    Follow up per nutrition services protocol while inpatient.    Recommendation(s) to Physician:  Recommend changing to Isosource 1.5, initiation at 20 ml/hr with advancement as tolerate until goal of 60 ml/hr is reached (provides 2160 kcal, 98 g pro, 253 g CHO, and 1100 ml free water)    Current water bolus meets hydration needs     Monitor TF tolerance, weight, nutrition-related labs, and POC    Clovis Fredrickson, RD, LD  Clinical Dietitian  Pager# [phone]

## 2021-11-03 NOTE — Unmapped (Addendum)
[facility]  DIVISION OF OTOLARYNGOLOGY- HEAD & NECK SURGERY  GENERAL INPATIENT PROGRESS NOTE      ASSESSMENT  Matthew [name] is a 76 y.o. male s/p C3-4 ACDF on 9/19 complicated by intraoperative CSF leak who presented with a CSF leak into the neck and parapharyngeal space. Patient is s/p CSF leak repair and neck exploration on 9/30.     PLAN  - Admitted to NSGY  - No acute ENT intervention   - Routine incision care  - rec Augmentin x 7d (10/4-11)  - Page ENT stat if any concerns for impending respiratory distress    SUBJECTIVE  Patient breathing comfortably on RA. Denies dyspnea. Speaking clearly. Less confused this morning. ENT place NGT yesterday. He is tolerating feeds. T max 100.8. non toxic appearing. Neck swelling much improved from prior exams. No other acute concerns at this time.       PHYSICAL EXAM  BP 116/85 (BP Location: Right upper arm, Patient Position: Lying, BP Cuff Size: Regular)   Pulse 71   Temp 98.3 F (36.8 C) (Oral)   Resp 20   Ht 5' 11 (1.803 m)   Wt 175 lb (79.4 kg)   SpO2 93%   BMI 24.41 kg/m     GENERAL: No Acute Distress, Alert and Oriented  EYES: EOMI, Anti-icteric  NOSE: No epistaxis  EARS: Normal auricles bilaterally  FACE: 1/6 House-Brackmann Scale  NECK: right neck incision intact, improved peri-incisional swelling  CHEST: Normal respiratory effort, no retractions. No stridor    LABORATORY  Past 24 hour labs:   Lab Results   Component Value Date    WBC 16.2 (H) 11/03/2021    HGB 11.7 (L) 11/03/2021    HCT 33.9 (L) 11/03/2021    MCV 93.8 11/03/2021    PLT 145 11/03/2021     Lab Results   Component Value Date    GLUCOSE 113 (H) 11/03/2021    BUN 18 11/03/2021    CREATININE 0.67 11/03/2021    K 4.1 11/03/2021    NA 137 11/03/2021    CL 98 11/03/2021    CALCIUM 9.2 11/03/2021     Lab Results   Component Value Date    MG 2.0 11/01/2021     Lab Results   Component Value Date    PHOS 2.7 11/03/2021     Lab Results   Component Value Date    ALKPHOS 73  10/19/2021    ALT 23 10/19/2021    AST 23 10/19/2021    BILITOT 0.7 10/19/2021    ALBUMIN 3.7 11/03/2021    PROT 7.1 10/19/2021     No results found for: LDH  No results found for: PTT  No results found for: AMYLASE  No results found for: LIPASE    RADIOLOGY  XR Feeding Tube Check   Final Result   FINDINGS/IMPRESSION:       1.  Placement of weighted enteric tube, tip likely in the gastric fundus with inner stylet in place.         Report Verified by: Arville Care, MD at 11/02/2021 4:47 PM EDT      FL MODIFIED BARIUM SWALLOW, INC SCOUT/DELAYED IMAGES w SPEECH Therapy   Final Result   IMPRESSION:      Laryngeal penetration and tracheal aspiration of thin and thick liquids.    Residual pooling of contrast material with subsequent silent aspiration on each swallow, independent of barium thickness.       Please correlate with the  speech pathology report for additional details.      Approved by Laurence Aly, MD on 11/01/2021 4:43 PM EDT      I have personally reviewed the images and I agree with this report.      Report Verified by: Norberta Keens, MD at 11/01/2021 4:59 PM EDT      CT Neck WO contrast   Final Result   IMPRESSION:      1.  Mild interval decrease in size of the multilobulated prevertebral/right neck fluid collection arising from the C3-C4 surgical site.               Approved by Kate Sable, MD on 11/01/2021 4:22 PM EDT      I have personally reviewed the images and I agree with this report.      Report Verified by: Burt Ek, MD at 11/01/2021 4:39 PM EDT      X-ray Cervical Spine 2 or 3-views   Final Result   IMPRESSION:      1.  Prevertebral soft tissue swelling and asymmetric soft tissue swelling along the right neck status post recent neck dissection and CSF repair. This is indeterminate for postoperative fluid/sweeling or recurrent CSF leak which may be further evaluated on the pending CT neck.   2.  Postsurgical changes of C3-C4 ACDF. No acute hardware complication.         Approved by Herby Abraham, DO on 11/01/2021 3:51 PM EDT      I have personally reviewed the images and I agree with this report.      Report Verified by: Particia Nearing, MD at 11/01/2021 4:34 PM EDT      X-ray Cervical Spine 2 or 3-views   Final Result   IMPRESSION:       Intraoperative fluoroscopy was performed. Correlate with procedural note for additional information.          Report Verified by: Shelba Flake, MD at 10/30/2021 2:51 PM EDT      Fluoro up to 1 hour   Final Result   IMPRESSION:       Intraoperative fluoroscopy was performed. Correlate with procedural note for additional information.          Report Verified by: Shelba Flake, MD at 10/30/2021 2:51 PM EDT      CT Neck WO contrast   Final Result   IMPRESSION:      1.  Nonspecific large multilobulated prevertebral/right neck fluid collection emanating from the surgical site at C3-C4. This could represent a postoperative seroma/serosanguineous collection though CSF leak and collection is a consideration given the reported intraoperative CSF leak.   2.  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               Report Verified by: Ezzie Dural, DO at 10/29/2021 9:47 PM EDT

## 2021-11-03 NOTE — Unmapped (Addendum)
[facility]  Case Management/Social Work Department  Progress Note    Patient Information     Patient Name: Matthew [name]  MRN: [id number]  Hospital day: 5  Inpatient/Observation:  Inpatient   Level of Care:  step down  Admit date:  10/29/2021  Admission diagnosis: CSF leak [G96.00]    PMH:  has a past medical history of Cancer (CMS-HCC), Environmental allergies, Hearing loss, High cholesterol, Hypertension, Neck symptom, Sleep apnea, and Sleep disorder.    PCP:  Demetrius Charity, MD    Home Pharmacy:    [facility] [address] [place] - [address]  [address] [facility]  [place] [address]  Phone: [phone]     [facility]  [address]  [place] [address]  Phone: [phone]         Medical Insurance Coverage:  Payor: MEDICARE / Plan: MEDICARE A AND B / Product Type: Medicare /     Other Pertinent Information     RN Care Coordinator participated in interdisciplinary rounds with the MD team and completed chart review.   Patient made NPO with NGT placed yesterday due to swelling.  Unsure at this point if TF will be needed, depending upon if  the swelling subsides by this weekend or not.  Accepted with [facility] for home infusion, [facility] Burton [phone])  will follow up and possibly meet with patient and spouse tomorrow.   Accepted with [facility]  home care for home PT, OT, SLP and Skilled nursing.  Confirmed with Junious Dresser [phone]) in intake  that  they can provide all these services.     1700:  PT changing recommendations to IPR, OT pending today. Spoke with wife, provided a list for review.  Reviewed insurance/referral process, therapy focus, average LOS.    Would not need a precert.     Spouse requested referrals to [facility] and [facility]  sent as requested.     Discharge Plan     Anticipated discharge plan:   [facility]    Anticipated discharge date:  Weekend     CM/SW will continue to follow and remain available for discharge planning needs.      Elliot Dally RN CCM  [phone]

## 2021-11-03 NOTE — Unmapped (Signed)
[facility] OF [facility]  DEPARTMENT OF NEUROSURGERY  INPATIENT PROGRESS NOTE  Matthew [name]  [id number]  03-26-1945    HD: 6 POD: 4    Neurosurgery Attending: Motley, MD    Interval History:  - No acute events overnight  - LD@5  cc/hr with 85cc output  - NGT placed yesterday  - Neck incision swelling improving    Meds:  Current Facility-Administered Medications   Medication Dose Frequency Provider Last Admin    acetaminophen  650 mg Q6H Vickie Epley, MD 650 mg at 11/03/21 0631    amLODIPine  5 mg Daily 0900 Leontine Locket, MD 5 mg at 11/01/21 0848    atorvastatin  40 mg Nightly (2100) Leontine Locket, MD 40 mg at 11/02/21 1957    baclofen  10 mg Daily PRN Leontine Locket, MD 10 mg at 11/01/21 0847    bisacodyL  10 mg Daily PRN Leontine Locket, MD      brimonidine  1 drop BID Leontine Locket, MD 1 drop at 11/02/21 1955    cetirizine  10 mg Daily 0900 Leontine Locket, MD 10 mg at 11/01/21 0849    doxepin  10 mg Nightly (2100) Leontine Locket, MD 10 mg at 11/02/21 1957    electrolyte  75 mL/hr Continuous Clarise Cruz, MD 75 mL/hr at 11/01/21 1818    finasteride  5 mg Daily 0900 Leontine Locket, MD 5 mg at 11/01/21 5409    gabapentin  600 mg TID Leontine Locket, MD 600 mg at 11/02/21 1957    heparin  5,000 Units 3 times per day Vickie Epley, MD 5,000 Units at 11/03/21 0507    HYDROmorphone  0.5 mg Q4H PRN Clarise Cruz, MD      Or    HYDROmorphone  1 mg Q4H PRN Clarise Cruz, MD 1 mg at 11/02/21 1333    latanoprost  1 drop Nightly (2100) Leontine Locket, MD 1 drop at 11/02/21 2005    methocarbamoL  1,000 mg Q8H Christiana Cornea, MD 1,000 mg at 11/03/21 0105    ondansetron  4 mg Q6H PRN Leontine Locket, MD 4 mg at 11/02/21 8119    oxyCODONE  5 mg Q4H PRN Christiana Cornea, MD      Or    oxyCODONE  10 mg Q4H PRN Cordelia Poche, MD 10 mg at 11/03/21 0459    senna-docusate  1 tablet BID Leontine Locket, MD 1 tablet at 11/02/21 1957    timolol  1 drop Q12H Leontine Locket, MD 1 drop at 11/02/21 2253        Examination:  Temp:  [97.4 F (36.3 C)-100.8 F (38.2 C)] 98.2 F (36.8 C)  Heart Rate:  [73-103] 82  Resp:  [15-22] 18  BP: (117-163)/(67-100) 153/79    General:   Wound: c/d/I, small superficial hematoma near incision, less tense and slightly smaller than yesterday     Neurological  -GCS: 15  -Orientation: alert, oriented x 3 (person, place, and time)  -Language: speech fluent and appropriate  -Cranial Nerves: PERRL, EOMI, face symmetric, hearing intact, tongue midline   -Motor: FCCx4, full and symmetric strength and ROM    D EF EE HG IO   RUE:    5 5 5 5 5    LUE:     5 5 5 5 5     HF KE DF EHL PF   RLE: 5 5 5 5 5    LLE: 5 5 5 5 5   -  Sensation: sensation intact to light touch and symmetric in bilateral upper and lower extremities  -Reflex: no hyperreflexia or clonus appreciated, Hoffman's absent BL, babinski downgoing BL  -Tone: No abnormality of tone appreciated in bilateral upper or lower extremities.  -Coordination:No bilateral upper extremity drift; no dysmetria on finger-to-nose or finger-nose-finger assessments.     Labs:  Na/K/Cl/CO2:  137/4.1/98/31 (10/04 0024)  WBC/Hgb/Hct/Plts:  16.2/11.7/33.9/145 (10/04 0024)       Imaging Review  No new imaging to review    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 Patient underwent a C3-4 ACDF on 09/19 with intraoperative CSF leak. Patient admitted now with concern for continued CSF leak.     PLAN:  - Admitted to nsgy; Dr. Vella Redhead attending  - LD@5  cc/hr for wound healing   - Plan to clamp today  - HOB >45 degress at all times  - continue home meds post-op  - pain control  - bowel reg  - NGT currently in place   - needs daily speech evaluations   - Likely remove prior to discharge  -  Diet NPO pending speech reevaluations  - DVT prophylaxis: SCDs/SQH   - Dispo: pending recovery    This note was copied forward  from the note written by Derrill Center, CNP.  I have reviewed and updated the history, physical exam, data, assessment and plan of the note so it reflects the evaluation and management of the patient on today's date.     Please page [phone] with any questions or concerns.     Lajeana Strough J.Sherryll Burger, MD  Neurosurgery Resident  7:38 AM 11/03/2021

## 2021-11-03 NOTE — Unmapped (Signed)
Problem: Acute Pain  Description: Patient's pain progressing toward patient's stated pain goal  Goal: Patient will reduce or eliminate use of analgesics  Outcome: Progressing    Problem: Safety  Goal: Patient will be injury free during hospitalization  Description: Assess and monitor vitals signs, neurological status including level of consciousness and orientation. Assess patient's risk for falls and implement fall prevention plan of care and interventions per hospital policy.      Ensure arm band on, uncluttered walking paths in room, adequate room lighting, call light and overbed table within reach, bed in low position, wheels locked, side rails up per policy, and non-skid footwear provided.   Outcome: Progressing     Problem: Daily Care  Goal: Daily care needs are met  Description: Assess and monitor ability to perform self care and identify potential discharge needs.  Outcome: Progressing     Problem: Inadequate Gas Exchange  Goal: Patient is adequately oxygenated and ventilation is improved  Description: Assess and monitor vital signs, oxygen saturation, respiratory status to include rate, depth, effort, and lung sounds, mental status, cyanosis, and labs (ABG's).  Monitor effects of medications that may sedate the patient.  Collaborate with respiratory therapy to administer medications and treatments.  Outcome: Progressing

## 2021-11-03 NOTE — Unmapped (Signed)
Problem: Inadequate Gas Exchange  Goal: Patient is adequately oxygenated and ventilation is improved  Description: Assess and monitor vital signs, oxygen saturation, respiratory status to include rate, depth, effort, and lung sounds, mental status, cyanosis, and labs (ABG's).  Monitor effects of medications that may sedate the patient.  Collaborate with respiratory therapy to administer medications and treatments.  Outcome: Progressing

## 2021-11-03 NOTE — Unmapped (Signed)
Problem: Acute Pain  Description: Patient's pain progressing toward patient's stated pain goal  Goal: Patient will manage pain with the appropriate technique/intervention  Description: Assess and monitor patient's pain using appropriate pain scale. Collaborate with interdisciplinary team and initiate plan and interventions as ordered.  Re-assess patient's pain level 30-60 minutes after pain management intervention.  Outcome: Progressing     Problem: Safety  Goal: Patient will be injury free during hospitalization  Description: Assess and monitor vitals signs, neurological status including level of consciousness and orientation. Assess patient's risk for falls and implement fall prevention plan of care and interventions per hospital policy.      Ensure arm band on, uncluttered walking paths in room, adequate room lighting, call light and overbed table within reach, bed in low position, wheels locked, side rails up per policy, and non-skid footwear provided.   Outcome: Progressing     Problem: Patient will remain free of falls  Goal: Universal Fall Precautions  Outcome: Progressing     Problem: Daily Care  Goal: Daily care needs are met  Description: Assess and monitor ability to perform self care and identify potential discharge needs.  Outcome: Progressing     Problem: Patient will remain free of injury d/t fall  Goal: High Fall Risk Precautions  Outcome: Progressing

## 2021-11-03 NOTE — Unmapped (Signed)
Spoke with Rachelle Hora the nurse for patient. She states she has spoken to patient's wife an addressed all questions/concerns. Patient updated on plan of care.

## 2021-11-03 NOTE — Unmapped (Signed)
Physical Therapy  Treatment    Name: Matthew [name]  DOB: 1945/11/08  Attending Physician: Tempie Hoist, MD  Admission Diagnosis: CSF leak [G96.00]  Date: 11/03/2021  Room: 1478/G9562  Reviewed Pertinent hospital course: Yes    Hospital Course PT/OT: 76 y.o. male presents with neck pain and swelling. 9/30 OR: CSF leak repair, neck exploration, lumbari drain placement.  Relevant PMH : C3-C4 ACDF with introperative CSF leak  Precautions: none  Activity Level: Activity as tolerated    Assist: None    Assessment  Pt was motivated to participate in PT treatment session this date, however, Pt w/ decline in functional status since evaluation w/ new impairments in sensation on left LE and decreased left side awareness w/ all functional mobility. Pt is at increased risk of falls d/t impairments in balance and coordination leading to ataxic gait quality. Based on performance this date, Pt is most appropriate for IPR level of care when medically appropriate in order to maximize return to PLOF. Long discussion held w/ Pt and family who both verbalize understanding. Will continue to progress per inpatient PT POC as able.     Patient will benefit from the intensity of an acute rehab stay with multiple skilled therapy disciplines. The patient will benefit significantly from and is able to tolerate extensive daily therapy (at least 3 hours per day). At this time the patient is below their baseline with functional mobility, requiring increased need for assistance and increased burden of care.      Assessment: Impaired Bed Mobility;Impaired Transfers;Impaired Gait;Impaired Psychologist, counselling;Impaired Balance;Impaired Strength;Impaired Activity Tolerance;Impaired Safety Awareness;Impaired Sensation;Impaired Motor Control  Prognosis: Good    Recommendation  Recommendation: IP Rehab  Equipment Recommended: Rolling walker, Patient already has needed DME    Justification for DME ordered: [name]: Patient has decreased weight  bearing or impaired balance putting them at risk for falling without use of a walker. They are unable to utilize crutches or a cane to provide adequate support    Outcome Measures  AM-PAC 6 Clicks Basic Mobility Inpatient Short Form: PT 6 Clicks Score: 14          Mobility Recommendations for Staff  Patient ability: Patient transfers to chair/ bedside commode  Assist needed: with 1 person assist  Equipment/ Precautions needed: Requires assistive device  Requires Assistive Device: Rolling walker    Cognition  Overall Cognitive Status: Impaired  Arousal/Alertness: Alert  Orientation Level: Oriented X4;with increased time  Behavior: Cooperative;Confused;Motivated;Distracted  Following Commands: Follows one step commands;Requires repetition of instruction;Requires verbal cues  Safety Judgment: Decreased awareness of need for assistance     Pain  Pain Score: 0 - No Pain     Mobility  Bed Mobility  Supine to Sit: Contact Guard assistance;increased time to complete task;head of bed elevated;towards the left  Sit to Supine:  (Pt left sitting up in chair)  Transfers  Sit to Stand: Minimal assistance;increased time to complete task;up to assistive device;Moderate assistance;cues for hand placement (from EOB: Mod A first rep; progressed to Min A)  Sit to Stand Assistive Device: Rolling walker  Stand to Sit: Minimal assistance;Moderate assistance;increased time to complete task;with assistive device;cues for hand placement  Stand to Sit Assistive Device: Rolling walker  Bed to Chair: Moderate assistance;increased time to complete task;stand step;with assistive device;towards the left (ataxic step sequence with Pt scissoring right LE over left when transferring toward left; step by step cues for sequence w/ therapist facilitating weight shift and managing RW)  Bed to Chair Assistive Device:  Rolling walker  Balance  Sitting - Static: Contact Guard assistance;Minimal assistance (intermittent posterior lean)  Sitting - Dynamic:  Minimal Assistance  Standing - Static: Minimal Assistance;Moderate Assistance;With Assistive Device (increased left lateral lean)   Standing-Static Assistive Device: Rolling walker  Standing - Dynamic: Moderate Assistance;With Assistive Device  Standing-Dynamic Assistive Device: Rolling walker    Gait belt used: Yes           Position after Treatment and Safety Handoff  Position after treatment and safety handoff  Position after therapy session: Recliner  Details: RN notified;Call light/ needs within reach  Alarms: Bed  Alarms Status: Activated and Interfaced with call system    Goals  Goals Met: None    Collaborated with: Patient  Patient Stated Goal: to go home  Goals to be met by: 11/07/21  Patient will transition from supine to sit: Independent  Patient will transition from sit to supine: Independent  Patient will transfer from sit to stand: Contact Guard assistance  Patient will ambulate: Supervision (150' with LRAD)  Long Term Goal : Pt will tolerate formal balance assessment    Patient/Family Education  Educated patient and patient's family on the role of physical therapy, plan of care, importance of increased activity, discharge recommendations, transfer training, gait training, and safety and need for supervision during OOB activity and fall prevention strategies, including need for supervision/ assistance with OOB activity and use of call light; patient and patient's family verbalized understanding and needed cues. Handout(s) issued: N/A.    Plan  Plan  Treatment/Interventions: LE strengthening/ROM, Museum/gallery curator, Therapeutic Activity, Gait training, Therapeutic Exercise, Patient/family training, Neuromuscular Reeducation  PT Frequency: minimum 3x/week    The plan of care and recommendations assesses the patient's and/or caregiver's readiness, willingness, and ability to provide or support functional mobility and ADL tasks as needed upon discharge.      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    Environmental allergies     Hearing loss     left (since age 86), right more recent    High cholesterol     Hypertension     Neck symptom     Trouble turning head to the left     Sleep apnea     Sleep disorder         Past Surgical History  Past Surgical History:   Procedure Laterality Date    ACHILLES TENDON REPAIR  01/31/2002    left     APPENDECTOMY      HERNIA REPAIR  02/01/1951    INCISION AND DRAINAGE POSTERIOR LUMBAR SPINE N/A 10/30/2021    Procedure: possible lumbar drain;  Surgeon: Tempie Hoist, MD;  Location: [facility] OR;  Service: Neurosurgery;  Laterality: N/A;    IRRIGATION AND DEBRIDEMENT SPINE  N/A 10/30/2021    Procedure: CSF leak repair, exploration of the neck;  Surgeon: Tempie Hoist, MD;  Location: [facility] OR;  Service: Neurosurgery;  Laterality: N/A;    KNEE SURGERY Left 1976    SPINE SURGERY      TONSILLECTOMY      WISDOM TOOTH EXTRACTION

## 2021-11-03 NOTE — Unmapped (Signed)
Matthew [name] patient's wife is calling regarding speech therapy and asking if this can be done today     Matthew [name]   [phone] cell

## 2021-11-04 LAB — CBC
Hematocrit: 31.6 % (ref 38.5–50.0)
Hemoglobin: 10.9 g/dL (ref 13.2–17.1)
MCH: 32.2 pg (ref 27.0–33.0)
MCHC: 34.4 g/dL (ref 32.0–36.0)
MCV: 93.8 fL (ref 80.0–100.0)
MPV: 8.3 fL (ref 7.5–11.5)
Platelets: 147 10*3/uL (ref 140–400)
RBC: 3.37 10*6/uL (ref 4.20–5.80)
RDW: 13 % (ref 11.0–15.0)
WBC: 14.4 10*3/uL (ref 3.8–10.8)

## 2021-11-04 LAB — RENAL FUNCTION PANEL W/EGFR
Albumin: 3.4 g/dL (ref 3.5–5.7)
Anion Gap: 9 mmol/L (ref 3–16)
BUN: 19 mg/dL (ref 7–25)
CO2: 29 mmol/L (ref 21–33)
Calcium: 8.8 mg/dL (ref 8.6–10.3)
Chloride: 102 mmol/L (ref 98–110)
Creatinine: 0.61 mg/dL (ref 0.60–1.30)
EGFR: >90
Glucose: 126 mg/dL (ref 70–100)
Osmolality, Calculated: 294 mOsm/kg (ref 278–305)
Phosphorus: 1.6 mg/dL (ref 2.1–4.5)
Potassium: 3.7 mmol/L (ref 3.5–5.3)
Sodium: 140 mmol/L (ref 133–146)

## 2021-11-04 MED ORDER — hydrOXYzine HCL (ATARAX) tablet 25 mg
25 | Freq: Four times a day (QID) | ORAL | PRN
Start: 2021-11-04 — End: 2021-11-05
  Administered 2021-11-05: 02:00:00 25 mg via ORAL

## 2021-11-04 MED ORDER — melatonin tablet Tab 3 mg
3 | Freq: Every evening | ORAL
Start: 2021-11-04 — End: 2021-11-09
  Administered 2021-11-04 – 2021-11-09 (×5): 3 mg via ORAL

## 2021-11-04 MED ORDER — diazePAM (VALIUM) tablet 5 mg
5 | Freq: Four times a day (QID) | ORAL | PRN
Start: 2021-11-04 — End: 2021-11-04

## 2021-11-04 MED FILL — TYLENOL 325 MG TABLET: 325 325 mg | ORAL | Qty: 2

## 2021-11-04 MED FILL — HEPARIN (PORCINE) 5,000 UNIT/ML INJECTION SOLUTION: 5000 5,000 unit/mL | INTRAMUSCULAR | Qty: 1

## 2021-11-04 MED FILL — MELATONIN 3 MG TABLET: 3 3 mg | ORAL | Qty: 1

## 2021-11-04 MED FILL — AMLODIPINE 5 MG TABLET: 5 5 MG | ORAL | Qty: 1

## 2021-11-04 MED FILL — SENNOSIDES 8.6 MG-DOCUSATE SODIUM 50 MG TABLET: 8.6-50 8.6-50 mg | ORAL | Qty: 1

## 2021-11-04 MED FILL — FINASTERIDE 5 MG TABLET: 5 5 mg | ORAL | Qty: 1

## 2021-11-04 MED FILL — GABAPENTIN 300 MG CAPSULE: 300 300 MG | ORAL | Qty: 2

## 2021-11-04 MED FILL — AMOXICILLIN 500 MG-POTASSIUM CLAVULANATE 125 MG TABLET: 500-125 500-125 mg | ORAL | Qty: 1

## 2021-11-04 MED FILL — HYDROXYZINE HCL 25 MG TABLET: 25 25 MG | ORAL | Qty: 1

## 2021-11-04 MED FILL — OXYCODONE 5 MG TABLET: 5 5 MG | ORAL | Qty: 1

## 2021-11-04 MED FILL — ROBAXIN 100 MG/ML INJECTION SOLUTION: 100 100 mg/mL | INTRAMUSCULAR | Qty: 10

## 2021-11-04 MED FILL — CETIRIZINE 10 MG TABLET: 10 10 MG | ORAL | Qty: 1

## 2021-11-04 MED FILL — DOXEPIN 10 MG CAPSULE: 10 10 MG | ORAL | Qty: 1

## 2021-11-04 NOTE — Unmapped (Signed)
[facility]  Case Management/Social Work Department  Progress Note    Patient Information     Patient Name: Matthew [name]  MRN: [id number]  Hospital day: 6  Inpatient/Observation:  Inpatient   Level of Care:  floor  Admit date:  10/29/2021  Admission diagnosis: CSF leak [G96.00]    PMH:  has a past medical history of Cancer (CMS-HCC), Environmental allergies, Hearing loss, High cholesterol, Hypertension, Neck symptom, Sleep apnea, and Sleep disorder.    PCP:  Demetrius Charity, MD    Home Pharmacy:    [facility] [address] [place] - [address]  [address] [facility]  [place] [address]  Phone: [phone]     [facility]  [address]  [place] [address]  Phone: [phone]         Medical Insurance Coverage:  Payor: MEDICARE / Plan: MEDICARE A AND B / Product Type: Medicare /     Other Pertinent Information     Per team rounding patient is not medically ready for discharge pending ongoing SLP evaluation. Spoke with Arline Asp at [facility] [phone] and she advised she met with patient bedside and spoke with patient nurse and agreeable to placement. Spoke to team and patient has eval with SLP for diet advancement.     Discharge Plan     Anticipated discharge plan:  IPR @ [facility]     Anticipated discharge date:  10/05     CM/SW will continue to follow and remain available for discharge planning needs.      Augusto Gamble BSN RN  Registered Nurse Case Manager  [email or web address]

## 2021-11-04 NOTE — Unmapped (Signed)
Speech Language Pathology  Dysphagia Progress Note         Name: Matthew [name]  DOB: 1945-12-09  Attending Physician: Tempie Hoist, MD  Admission Diagnosis: CSF leak [G96.00]  Postoperative CSF leak [G97.82, G96.00]  Date: 11/04/2021  Precautions: None  Reviewed Pertinent hospital course: Yes  Hospital Course SLP: 76 y/o male s/p C3-4 ACDF on 9/19 complicated by intraoperative CSF leak presents with progressive R neck swelling concerning for continued CSF leak. 9/30: underwent CSF leak repair and neck exploration.  Imaging: No recent chest imaging.  SLP Hx: 9/20: seen for bedside swallow evaluation with recs for regular diet with thin liquids.     Assessment: Pt p/w ongoing oropharyngeal dysphagia. Pt participated in recent MBSS (11/01/21) and noted to have laryngeal penetration, silent aspiration and over aspiration of thin, nectar, and honey thick liquids during and after the swallow. Pt is very motivated to improve swallow and return to baseline diet of regular texture foods and liquids. Pt continue to p/w overt s/sx of aspiration w/ PO trials (ice chip), however Pt, Pt's family and medical team would like a repeat instrumental evaluation as Pt is likely d/c soon to rehab facility. Suspect limited improvement in oropharyngeal swallow, however will compete Modified Barium Swallow Study to re-assess Pt's oropharyngeal swallow function to aid in determining least restrictive diet. Pending assessment would recommend NPO w/ Tfs w/ Ice chips preceded by excellent oral care.    Plan/Recommendation:   - Diet: NPO with TF  - SLP therapy 1-3x/week while inpatient  - SLP at discharge is recommended    Subjective:  Alert and Oriented x4.  Pain: denies pain    Objective:  Goals Short Term Dysphagia  Patient will demonstrate use of swallowing exercises: for laryngeal closure and pharyngeal constriction with 90% acc given min cues.  >>In progress. Pt completed x40 effortful swallow exercises this date w/ minimal cues  100% accuracy.    [name] will demonstrate understanding of clinical signs and symptoms of aspiration at 80% accuracy: given min cues.  >>In progress. Again discussed  and showed images from exam of silent and overt aspiration observed on MBSS study (11/01/21). Patient and wife demonstrate comprehension re: s/s aspiration. Will cont to re-educate.  [name] will demonstrate understanding of risks associated with aspiration at 100% accuracy: given min cues.  >>In progress. Reviewed risks associated with aspiration with patient and wife. Pt and spouse demonstrate understanding this date. Pt recalled risks 100% accuracy independently.  Time frame for goals to be met in: 10/13     Goals Long Term Dysphagia  Patient will tolerate the least restrictive diet as determined via instrumental assessment with 100% accuracy independently: with 100% acc independently.  >>Indirectly addressed. Again Intermittent throat clearing noted with ice chips.  Will repeat MBSS per Pt/Pt's family and medical team request.   Time frame for goals to be met in: 10/20    Education:  Patient educated on swallowing anatomy & physiology, dysphagia, risks of aspiration, current diet recommendations, and safe swallowing behaviors.   Patient demonstrates understanding.    Position End of Session:  Patient was left in bed with call light within reach and all needs met.  Safety handoff completed with RN.     Isabella Stalling, MA, CCC-SLP  Speech Language Pathologist, Rehab Services  [facility]    Time  Start Time: 0221  Stop Time: [phone]  Time Calculation (min): 37 min    Charges      $  Swallow Tx : 1 Procedure

## 2021-11-04 NOTE — Unmapped (Signed)
Occupational Therapy  Treatment     Name: Matthew [name]  DOB: [birth year]  Attending Physician: Tempie Hoist, MD  Admission Diagnosis: CSF leak [G96.00]  Date: 11/04/2021  Room: 1610/R6045  Reviewed Pertinent hospital course: Yes   Hospital Course PT/OT: 76 y.o. male presents with neck pain and swelling. 9/30 OR: CSF leak repair, neck exploration, lumbar drain placement. 10/5 lumbar drain removed.  Relevant PMH : C3-C4 ACDF with introperative CSF leak  Precautions: none  Activity Level: Activity as tolerated    Assist: None    Recommendation  Recommendation: IP Rehab  Equipment Recommendations: Patient already has needed DME      Assessment  Assessment: Decreased UE strength, Decreased activity tolerance, Decreased fine motor control, Decreased Functional Mobility, Decreased ADL status, Decreased Safe judgment during ADL, Decreased self-care transfers, Decreased Balance, Decreased UE ROM, Decreased cognition, Decreased IADLs          Prognosis for OT goals: Good                      Pt agreeable and motivated to participate in OT session on this date. Pt required significantly increased assistance for ADLs and functional mobility this date compared to initial evaluation. Pt completed STS transfers and bed>chair transfer with Mod A using a RW with max cues required for management of RW 2/2 impaired coordination. Additionally, pt required Max A to don socks and Min A to complete grooming with increased time required 2/2 impaired fine motor coordination. Pt is functioning significantly below baseline level of independence and is limited by decreased balance, decreased strength, decreased activity tolerance, and impaired cognition. Pt will benefit from the intensity of an acute rehab stay with multiple therapy disciplines. The pt will benefit significantly from and is able to tolerate extensive daily therapy (at least 3 hours per day). At this time the pt is below their baseline with ADL performance and  functional mobility, requiring increased need for assistance and increased burden of care. Patient will continue to benefit from skilled acute OT services to increase functional independence.     Outcome Measures  AM-PAC 6 Clicks Daily Activity Inpatient Short Form: OT 6 Clicks Score: 13     Cognition  Overall Cognitive Status: Impaired  Cognitive Assessment: Insight;Safety Judgment  Arousal/Alertness: Alert  Orientation Level: Oriented X4  Behavior: Cooperative;Confused;Motivated  Following Commands: Follows one step commands;Requires repetition of instruction;Requires verbal cues  Safety Judgment: Decreased safety awareness  Insight: Demonstrated decreased insight into limitations and abilities to complete ADLs safely    Pain  Pain Score: 0 - No Pain      Functional Mobility  Bed Mobility  Supine to Sit: Stand-by assistance;head of bed elevated;towards the right  Sit to Supine:  (Pt left seated in chair at end of session)  Functional Transfers  Sit to Stand: Moderate assistance;up to assistive device  Sit to Stand Assistive Device: Rolling walker  Stand to Sit: Moderate assistance;with assistive device  Stand to Sit Assistive Device: Rolling walker  Bed to Chair: Moderate assistance;stand step;towards the right;with assistive device (cues for management of RW; pt with poor motor control)  Bed to Chair Assistive Device: Rolling walker  Balance  Sitting - Static: Stand-by assistance  Sitting - Dynamic: Contact Guard Assistance  Standing - Static: Minimal Assistance;With Assistive Device   Standing-Static Assistive Device: Rolling walker  Standing - Dynamic: Moderate Assistance;With Assistive Device  Standing-Dynamic Assistive Device: Rolling walker    Gait belt used: Yes    ADL  Feeding:  N/A- NPO  Grooming: Minimal assistance  Grooming Deficit: Set-up;Wash/dry face;Teeth care;Brushing hair  Location Assessed Grooming: Seated in chair  Grooming Deficit Additional Comments: Min A for containers and for thoroughness;  Pt with decreased fine motor coordination requiring increased time for grooming tasks  Lower Body Dressing: Maximum assistance  Lower Body Dressing Deficit: Don/doff R sock;Don/doff L sock;Verbal cueing;Increased time to complete  Location Assessed LE Dressing: Seated edge of bed  Lower Body Dressing Deficit Additional Comments: attempting figure four position       Position after Treatment/Safety Handoff  Position after therapy session: Chair  Details: RN notified;Call light/ needs within reach  Alarms: Chair  Alarms Status: Activated and Interfaced with call system    Goals   Goals to be met in: 1 week (all goals modified 10/5 to reflect change in status)  Patient stated goal: to go home  Patient will complete supine to sit in prep for ADLs: Supervision  Patient will complete toilet transfer: Contact Guard assistance  Patient will complete toileting: Minimal assistance  Patient will complete grooming task: Minimal assistance (in stance)  Patient will complete lower body dressing: Minimal assistance (don pants)  Long Term Goal : Pt will tolerate bathing assessment  Long term goal to be met in: 2 weeks  Collaborated with: Patient    Plan  Plan  Treatment Interventions: ADL retraining, Patient/Family training, Activity Tolerance training, Compensatory technique education, Energy Conservation, Functional transfer training, Therapeutic Activity, IADL retraining, Cognitive reorientation, UE strengthening/ROM, Excercise  OT Frequency: minimum 3x/week  The plan of care and recommendations assesses the patient's and/or caregiver's readiness, willingness, and ability to provide or support functional mobility and ADL tasks as needed upon discharge.    Patient/Family Education  Educated patient on the role of occupational therapy, discharge recommendation, ADL training, functional mobility training, and the importance of safety and fall prevention strategies including need for supervision/ assistance with OOB activity and use  of call light. patient  verbalized understanding, needed cues, and will need reinforcement.    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    Environmental allergies     Hearing loss     left (since age 14), right more recent    High cholesterol     Hypertension     Neck symptom     Trouble turning head to the left     Sleep apnea     Sleep disorder      Past Surgical History  Past Surgical History:   Procedure Laterality Date    ACHILLES TENDON REPAIR  01/31/2002    left     APPENDECTOMY      HERNIA REPAIR  02/01/1951    INCISION AND DRAINAGE POSTERIOR LUMBAR SPINE N/A 10/30/2021    Procedure: possible lumbar drain;  Surgeon: Tempie Hoist, MD;  Location: [facility] OR;  Service: Neurosurgery;  Laterality: N/A;    IRRIGATION AND DEBRIDEMENT SPINE N/A 10/30/2021    Procedure: CSF leak repair, exploration of the neck;  Surgeon: Tempie Hoist, MD;  Location: [facility] OR;  Service: Neurosurgery;  Laterality: N/A;    KNEE SURGERY Left 1976    SPINE SURGERY      TONSILLECTOMY      WISDOM TOOTH EXTRACTION

## 2021-11-04 NOTE — Unmapped (Signed)
[facility] OF [facility]  DEPARTMENT OF NEUROSURGERY  INPATIENT PROGRESS NOTE  Matthew [name]  [id number]  1945-04-28    HD: 7 POD: 5    Neurosurgery Attending: Motley, MD    Interval History:  - No acute events overnight  - LD@clamped ; tolerating   - NGT in; pending SLP re-eval for possible removal   - Neck incision swelling improving     Meds:  Current Facility-Administered Medications   Medication Dose Frequency Provider Last Admin    acetaminophen  650 mg Q6H Vickie Epley, MD 650 mg at 11/04/21 0559    amLODIPine  5 mg Daily 0900 Leontine Locket, MD 5 mg at 11/03/21 0912    AMOXicillin-clavulanate  1 tablet 2 times per day Vickie Epley, MD 1 tablet at 11/03/21 2134    atorvastatin  40 mg Nightly (2100) Leontine Locket, MD 40 mg at 11/03/21 1955    baclofen  10 mg Daily PRN Leontine Locket, MD 10 mg at 11/01/21 0847    bisacodyL  10 mg Daily PRN Leontine Locket, MD 10 mg at 11/03/21 1815    brimonidine  1 drop BID Leontine Locket, MD 1 drop at 11/03/21 1956    cetirizine  10 mg Daily 0900 Leontine Locket, MD 10 mg at 11/03/21 0913    doxepin  10 mg Nightly (2100) Leontine Locket, MD 10 mg at 11/03/21 1955    electrolyte  75 mL/hr Continuous Clarise Cruz, MD Rate Verify at 11/03/21 1800    finasteride  5 mg Daily 0900 Leontine Locket, MD 5 mg at 11/01/21 5409    gabapentin  600 mg TID Leontine Locket, MD 600 mg at 11/03/21 1955    heparin  5,000 Units 3 times per day Vickie Epley, MD 5,000 Units at 11/04/21 0559    hydrOXYzine HCL  25 mg 4x Daily PRN Derrill Center, CNP      latanoprost  1 drop Nightly (2100) Leontine Locket, MD 1 drop at 11/03/21 1956    melatonin  3 mg Nightly (2100) Robin Searing, MD 3 mg at 11/04/21 0147    methocarbamoL  1,000 mg Q8H Christiana Cornea, MD 1,000 mg at 11/04/21 0050    ondansetron  4 mg Q6H PRN Leontine Locket, MD 4 mg at 11/02/21 8119    oxyCODONE  5 mg Q4H PRN Christiana Cornea, MD      Or    oxyCODONE  10 mg Q4H PRN Christiana Cornea, MD 10 mg at 11/03/21 1035    senna-docusate  1  tablet BID Leontine Locket, MD 1 tablet at 11/03/21 1955    timolol  1 drop Q12H Leontine Locket, MD 1 drop at 11/03/21 2258       Examination:  Temp:  [97.8 F (36.6 C)-99.4 F (37.4 C)] 97.8 F (36.6 C)  Heart Rate:  [63-100] 78  Resp:  [11-22] 17  BP: (104-149)/(60-116) 149/80    General:   Wound: c/d/I, small superficial hematoma near incision, less tense and slightly smaller than yesterday     Neurological  -GCS: 15  -Orientation: alert, oriented x 3 (person, place, and time)  -Language: speech fluent and appropriate  -Cranial Nerves: PERRL, EOMI, face symmetric, hearing intact, tongue midline   -Motor: FCCx4, full and symmetric strength and ROM    D EF EE HG IO   RUE:    5 5 5 5 5    LUE:     5 5 5  5  5    HF KE DF EHL PF   RLE: 5 5 5 5 5    LLE: 5 5 5 5 5   -Sensation: sensation intact to light touch and symmetric in bilateral upper and lower extremities  -Reflex: no hyperreflexia or clonus appreciated, Hoffman's absent BL, babinski downgoing BL  -Tone: No abnormality of tone appreciated in bilateral upper or lower extremities.  -Coordination:No bilateral upper extremity drift; no dysmetria on finger-to-nose or finger-nose-finger assessments.     Labs:  Na/K/Cl/CO2:  140/3.7/102/29 (10/05 0104)  WBC/Hgb/Hct/Plts:  14.4/10.9/31.6/147 (10/05 0104)       Imaging Review  No new imaging to review    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 Patient underwent a C3-4 ACDF on 09/19 with intraoperative CSF leak. Patient admitted now with concern for continued CSF leak.     PLAN:  - Admitted to nsgy; Dr. Vella Redhead attending  - LD@clamped ; DC today   -DC foley   - HOB >45 degress at all times  - continue home meds post-op  - pain control  - bowel reg  - NGT currently in place   - needs daily speech evaluations   - Likely remove prior to discharge  -  Diet NPO pending speech  reevaluations  - DVT prophylaxis: SCDs/SQH   - Dispo: pending recovery    This note was copied forward from the note written by Dr. Sherryll Burger.  I have reviewed and updated the history, physical exam, data, assessment and plan of the note so it reflects the evaluation and management of the patient on today's date.     Please page [phone] with any questions or concerns.     Derrill Center, APRN  Neurosurgery  Pager# [phone]  Please Page NS Day pager at Pager [phone] if unable to reach  11/04/2021  7:50 AM

## 2021-11-04 NOTE — Unmapped (Signed)
Nurse entered room to find patient crooked in bed, complaining of lower back pain. Lower back observed to find lumbar drain tube snapped/disconnected from patient. Drain clamped with hemostats. NES team stat paged. Resident to bedside to evaluate. Decision to remove rest of drain made and drain then removed by resident Burnetta SabinIvey MD. Patient placed on side and lidocaine used for comfort. No issues removing drain, patient now resting comfortably in bed. Will continue to monitor for patient needs.

## 2021-11-04 NOTE — Unmapped (Signed)
Problem: Acute Pain  Description: Patient's pain progressing toward patient's stated pain goal  Goal: Patient will manage pain with the appropriate technique/intervention  Description: Assess and monitor patient's pain using appropriate pain scale. Collaborate with interdisciplinary team and initiate plan and interventions as ordered.  Re-assess patient's pain level 30-60 minutes after pain management intervention.  Outcome: Progressing  Goal: Patient will reduce or eliminate use of analgesics  Outcome: Progressing  Goal: Patients pain is managed to allow active participation in daily activities  Outcome: Progressing  Goal: Patient verbalizes a reduction in pain level  Outcome: Progressing

## 2021-11-04 NOTE — Unmapped (Signed)
Patient transferred from the 4th floor to room 5156. VSS. Patient AxOx4. Endorses chronic neuropathy pain. Secure messages sent to PT/OT/SLP as patient is anxious to get started on therapy and get ready to leave the hospital. Wife visiting in room with patient. Call light in reach and bed in lowest position.

## 2021-11-04 NOTE — Unmapped (Signed)
[facility]  DIVISION OF OTOLARYNGOLOGY- HEAD & NECK SURGERY  GENERAL INPATIENT PROGRESS NOTE      ASSESSMENT  Matthew [name] is a 76 y.o. male s/p C3-4 ACDF on 9/19 complicated by intraoperative CSF leak who presented with a CSF leak into the neck and parapharyngeal space. Patient is s/p CSF leak repair and neck exploration on 9/30.     PLAN  - Admitted to NSGY  - No acute ENT intervention   - Routine incision care  - rec Augmentin x 7d (10/4-11)  - Page ENT stat if any concerns for impending respiratory distress    SUBJECTIVE  Patient breathing comfortably on RA. Denies dyspnea. Speaking clearly. Neck looks much improved. Will continue to monitor.       PHYSICAL EXAM  BP 163/81 (BP Location: Right upper arm, Patient Position: Lying, BP Cuff Size: Regular)   Pulse 79   Temp 98.5 F (36.9 C) (Oral)   Resp 19   Ht 5' 11 (1.803 m)   Wt 175 lb (79.4 kg)   SpO2 97%   BMI 24.41 kg/m     GENERAL: No Acute Distress, Alert and Oriented  EYES: EOMI, Anti-icteric  NOSE: No epistaxis  EARS: Normal auricles bilaterally  FACE: 1/6 House-Brackmann Scale  NECK: right neck incision intact, improved peri-incisional swelling  CHEST: Normal respiratory effort, no retractions. No stridor    LABORATORY  Past 24 hour labs:   Lab Results   Component Value Date    WBC 14.4 (H) 11/04/2021    HGB 10.9 (L) 11/04/2021    HCT 31.6 (L) 11/04/2021    MCV 93.8 11/04/2021    PLT 147 11/04/2021     Lab Results   Component Value Date    GLUCOSE 126 (H) 11/04/2021    BUN 19 11/04/2021    CREATININE 0.61 11/04/2021    K 3.7 11/04/2021    NA 140 11/04/2021    CL 102 11/04/2021    CALCIUM 8.8 11/04/2021     Lab Results   Component Value Date    MG 2.0 11/01/2021     Lab Results   Component Value Date    PHOS 1.6 (L) 11/04/2021     Lab Results   Component Value Date    ALKPHOS 73 10/19/2021    ALT 23 10/19/2021    AST 23 10/19/2021    BILITOT 0.7 10/19/2021    ALBUMIN 3.4 (L) 11/04/2021    PROT 7.1 10/19/2021     No  results found for: LDH  No results found for: PTT  No results found for: AMYLASE  No results found for: LIPASE    RADIOLOGY  XR Feeding Tube Check   Final Result   FINDINGS/IMPRESSION:       1.  Placement of weighted enteric tube, tip likely in the gastric fundus with inner stylet in place.         Report Verified by: Arville Care, MD at 11/02/2021 4:47 PM EDT      FL MODIFIED BARIUM SWALLOW, INC SCOUT/DELAYED IMAGES w SPEECH Therapy   Final Result   IMPRESSION:      Laryngeal penetration and tracheal aspiration of thin and thick liquids.    Residual pooling of contrast material with subsequent silent aspiration on each swallow, independent of barium thickness.       Please correlate with the speech pathology report for additional details.      Approved by Laurence Aly, MD on 11/01/2021 4:43 PM EDT  I have personally reviewed the images and I agree with this report.      Report Verified by: Norberta KeensKyuran Choe, MD at 11/01/2021 4:59 PM EDT      CT Neck WO contrast   Final Result   IMPRESSION:      1.  Mild interval decrease in size of the multilobulated prevertebral/right neck fluid collection arising from the C3-C4 surgical site.               Approved by Kate SableJessica Blaza, MD on 11/01/2021 4:22 PM EDT      I have personally reviewed the images and I agree with this report.      Report Verified by: Burt Ekhomas Tomsick, MD at 11/01/2021 4:39 PM EDT      X-ray Cervical Spine 2 or 3-views   Final Result   IMPRESSION:      1.  Prevertebral soft tissue swelling and asymmetric soft tissue swelling along the right neck status post recent neck dissection and CSF repair. This is indeterminate for postoperative fluid/sweeling or recurrent CSF leak which may be further evaluated on the pending CT neck.   2.  Postsurgical changes of C3-C4 ACDF. No acute hardware complication.         Approved by Herby Abrahamahul Rao, DO on 11/01/2021 3:51 PM EDT      I have personally reviewed the images and I agree with this report.      Report Verified by: Particia NearingJeffrey  Youngquist, MD at 11/01/2021 4:34 PM EDT      X-ray Cervical Spine 2 or 3-views   Final Result   IMPRESSION:       Intraoperative fluoroscopy was performed. Correlate with procedural note for additional information.          Report Verified by: Shelba Flakeimothy Klostermeier, MD at 10/30/2021 2:51 PM EDT      Fluoro up to 1 hour   Final Result   IMPRESSION:       Intraoperative fluoroscopy was performed. Correlate with procedural note for additional information.          Report Verified by: Shelba Flakeimothy Klostermeier, MD at 10/30/2021 2:51 PM EDT      CT Neck WO contrast   Final Result   IMPRESSION:      1.  Nonspecific large multilobulated prevertebral/right neck fluid collection emanating from the surgical site at C3-C4. This could represent a postoperative seroma/serosanguineous collection though CSF leak and collection is a consideration given the reported intraoperative CSF leak.   2.  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               Report Verified by: Ezzie DuralMatthew Moler, DO at 10/29/2021 9:47 PM EDT

## 2021-11-05 ENCOUNTER — Inpatient Hospital Stay: Admit: 2021-11-05 | Payer: MEDICARE

## 2021-11-05 LAB — URINALYSIS W/RFL TO MICROSCOPIC
Bilirubin, UA: NEGATIVE
Blood, UA: NEGATIVE
Glucose, UA: NEGATIVE mg/dL
Ketones, UA: NEGATIVE mg/dL
Leukocyte Esterase, UA: NEGATIVE
Nitrite, UA: NEGATIVE
Protein, UA: NEGATIVE mg/dL
Specific Gravity, UA: 1.019 (ref 1.005–1.035)
Urobilinogen, UA: <2 mg/dL (ref 0.2–1.9)
pH, UA: 6 (ref 5.0–8.0)

## 2021-11-05 LAB — RENAL FUNCTION PANEL W/EGFR
Albumin: 3.6 g/dL (ref 3.5–5.7)
Anion Gap: 10 mmol/L (ref 3–16)
BUN: 16 mg/dL (ref 7–25)
CO2: 28 mmol/L (ref 21–33)
Calcium: 9.5 mg/dL (ref 8.6–10.3)
Chloride: 105 mmol/L (ref 98–110)
Creatinine: 0.64 mg/dL (ref 0.60–1.30)
EGFR: >90
Glucose: 115 mg/dL (ref 70–100)
Osmolality, Calculated: 298 mOsm/kg (ref 278–305)
Phosphorus: 2.4 mg/dL (ref 2.1–4.5)
Potassium: 3.6 mmol/L (ref 3.5–5.3)
Sodium: 143 mmol/L (ref 133–146)

## 2021-11-05 LAB — CBC
Hematocrit: 34.2 % — ABNORMAL LOW (ref 38.5–50.0)
Hemoglobin: 11.8 g/dL — ABNORMAL LOW (ref 13.2–17.1)
MCH: 32.1 pg (ref 27.0–33.0)
MCHC: 34.4 g/dL (ref 32.0–36.0)
MCV: 93.3 fL (ref 80.0–100.0)
MPV: 8.6 fL (ref 7.5–11.5)
Platelets: 177 10E3/uL (ref 140–400)
RBC: 3.66 10E6/uL — ABNORMAL LOW (ref 4.20–5.80)
RDW: 13 % (ref 11.0–15.0)
WBC: 9.3 10E3/uL (ref 3.8–10.8)

## 2021-11-05 MED ORDER — gabapentin (NEURONTIN) capsule 600 mg
300 | Freq: Every evening | ORAL
Start: 2021-11-05 — End: 2021-11-07
  Administered 2021-11-06 – 2021-11-07 (×2): 600 mg via ORAL

## 2021-11-05 MED ORDER — VARI-BAR (barium) Pste 1 teaspoonful
40 | Freq: Once | ORAL | Status: AC | PRN
Start: 2021-11-05 — End: 2021-11-05
  Administered 2021-11-05: 13:00:00 1 [tsp_us] via ORAL

## 2021-11-05 MED ORDER — VARIBAR HONEY (barium) suspension 1 teaspoonful
40 | Freq: Once | ORAL | Status: AC | PRN
Start: 2021-11-05 — End: 2021-11-05
  Administered 2021-11-05: 13:00:00 1 [tsp_us] via ORAL

## 2021-11-05 MED ORDER — oxyCODONE (ROXICODONE) immediate release tablet 2.5 mg
5 | ORAL | PRN
Start: 2021-11-05 — End: 2021-11-09
  Administered 2021-11-05: 22:00:00 2.5 mg via ORAL

## 2021-11-05 MED ORDER — QUEtiapine (SEROQUEL) tablet 25 mg
50 | Freq: Every evening | ORAL
Start: 2021-11-05 — End: 2021-11-09
  Administered 2021-11-06 – 2021-11-09 (×4): 25 mg via ORAL

## 2021-11-05 MED ORDER — barium sulfate (VARIBAR NECTAR) suspension 10 mL
40 | Freq: Once | ORAL | Status: AC | PRN
Start: 2021-11-05 — End: 2021-11-05
  Administered 2021-11-05: 13:00:00 10 mL via ORAL

## 2021-11-05 MED ORDER — oxyCODONE (ROXICODONE) immediate release tablet 5 mg
5 | ORAL | PRN
Start: 2021-11-05 — End: 2021-11-09
  Administered 2021-11-06 – 2021-11-07 (×2): 5 mg via ORAL

## 2021-11-05 MED ORDER — methocarbamoL (ROBAXIN) tablet 500 mg
500 | Freq: Four times a day (QID) | ORAL | PRN
Start: 2021-11-05 — End: 2021-11-07
  Administered 2021-11-06 – 2021-11-07 (×4): 500 mg via ORAL

## 2021-11-05 MED ORDER — VARIBAR THIN (barium) Powd 1 teaspoonful
81 | Freq: Once | ORAL | Status: AC | PRN
Start: 2021-11-05 — End: 2021-11-05
  Administered 2021-11-05: 13:00:00 1 [tsp_us] via ORAL

## 2021-11-05 MED FILL — HYDROXYZINE HCL 25 MG TABLET: 25 25 MG | ORAL | Qty: 1

## 2021-11-05 MED FILL — ATORVASTATIN 20 MG TABLET: 20 20 MG | ORAL | Qty: 2

## 2021-11-05 MED FILL — AMOXICILLIN 500 MG-POTASSIUM CLAVULANATE 125 MG TABLET: 500-125 500-125 mg | ORAL | Qty: 1

## 2021-11-05 MED FILL — GABAPENTIN 300 MG CAPSULE: 300 300 MG | ORAL | Qty: 2

## 2021-11-05 MED FILL — BACLOFEN 10 MG TABLET: 10 10 MG | ORAL | Qty: 1

## 2021-11-05 MED FILL — VARIBAR NECTAR 40 % (W/V), 30 % (W/W) ORAL SUSPENSION: 40 40 % (w/v) | ORAL | Qty: 240

## 2021-11-05 MED FILL — MELATONIN 3 MG TABLET: 3 3 mg | ORAL | Qty: 1

## 2021-11-05 MED FILL — HEPARIN (PORCINE) 5,000 UNIT/ML INJECTION SOLUTION: 5000 5,000 unit/mL | INTRAMUSCULAR | Qty: 1

## 2021-11-05 MED FILL — VARIBAR HONEY 40 % (W/V), 29% (W/W) ORAL SUSPENSION: 40 40 % (w/v) 29% (w/w) | ORAL | Qty: 250

## 2021-11-05 MED FILL — FINASTERIDE 5 MG TABLET: 5 5 mg | ORAL | Qty: 1

## 2021-11-05 MED FILL — VARIBAR THIN LIQUID 81 % (W/W) ORAL POWDER: 81 81 % (w/w) | ORAL | Qty: 5

## 2021-11-05 MED FILL — STIMULANT LAXATIVE PLUS 8.6 MG-50 MG TABLET: 8.6-50 8.6-50 mg | ORAL | Qty: 1

## 2021-11-05 MED FILL — DOXEPIN 10 MG CAPSULE: 10 10 MG | ORAL | Qty: 1

## 2021-11-05 MED FILL — OXYCODONE 5 MG TABLET: 5 5 MG | ORAL | Qty: 1

## 2021-11-05 MED FILL — CETIRIZINE 10 MG TABLET: 10 10 MG | ORAL | Qty: 1

## 2021-11-05 MED FILL — TYLENOL 325 MG TABLET: 325 325 mg | ORAL | Qty: 2

## 2021-11-05 MED FILL — AMLODIPINE 5 MG TABLET: 5 5 MG | ORAL | Qty: 1

## 2021-11-05 MED FILL — VARIBAR PUDDING 40 % (W/V), 30% (W/W) ORAL PASTE: 40 40 % (w/v), 30% (w/w) | ORAL | Qty: 230

## 2021-11-05 NOTE — Unmapped (Signed)
Occupational Therapy  Treatment     Name: Matthew [name]  DOB: [birth year]  Attending Physician: Tempie Hoist, MD  Admission Diagnosis: CSF leak [G96.00]  Postoperative CSF leak [G97.82, G96.00]  Date: 11/05/2021  Room: 6295/M8413  Reviewed Pertinent hospital course: Yes   Hospital Course PT/OT: 77 y.o. male presents with neck pain and swelling. 9/30 OR: CSF leak repair, neck exploration, lumbar drain placement. 10/5 lumbar drain removed.  Relevant PMH : C3-C4 ACDF with introperative CSF leak  Precautions: none  Activity Level: Activity as tolerated    Assist: None    Recommendation  Recommendation: IP Rehab  Equipment Recommendations: Patient already has needed DME      Assessment  Assessment: Decreased ADL status, Decreased UE strength, Decreased Safe judgment during ADL, Decreased cognition, Decreased activity tolerance, Decreased Balance, Decreased Functional Mobility, Decreased IADLs, Decreased self-care transfers, Decreased fine motor control          Prognosis for OT goals: Fair                   Pt in bed upon entry with spouse present. Spouse states pt has been recently hallucinating and delirious. Spouse and pt prefer bed level activity at this time. Pt participates in grooming at bed level with min A for force production at trunk, thoroughness with tasks, and SU d/t impaired FMC/visual perception. No noted hallucinations during session, however pt occasionally tangential. Pt will benefit from continued skilled OT to promote IND with ADL/IADL and functional mobility.        Outcome Measures  AM-PAC 6 Clicks Daily Activity Inpatient Short Form: OT 6 Clicks Score: 13     Cognition  Arousal/Alertness: Alert  Orientation Level: Oriented X4  Behavior: Tangential  Following Commands: Follows one step commands;Requires verbal cues;Requires repetition of instruction;Requires tactile cues;Requires demonstration;Requires increased time  Comments: Pt verbalizing that he had been experiencing hallucinations  earlier--as if room was rotated on it's side    Pain  Pain Score: 8   Pain Location:  (upper back)  Pain Descriptors: Aching  Pain Intervention(s): Repositioned;Therapeutic presence  Therapist reported pain to: RN    Exercises       Functional Mobility       Gait belt used: N/A (out of bed mobility not performed)    ADL  Grooming: Minimal assistance  Grooming Deficit: Teeth care;Wash/dry face;Set-up;Verbal cueing;Supervision/safety;Brushing hair  Location Assessed Grooming: Seated in bed  Grooming Deficit Additional Comments: Pt requires assist for forward flexion at trunk for rinsing/spitting. Pt requries assistance to open toothpaste due to impaired coordination. Assist to thoroughly wash face  Additional Comments: Spouse present and has several questions regarding potential dc to IPR and expectations for therapy.       Position after Treatment/Safety Handoff  Position after therapy session: Bed  Details: RN notified;visitor present;Call light/ needs within reach  Alarms: Bed  Alarms Status: Activated and Interfaced with call system    Goals   Goals to be met in: 1 week (all goals modified 10/5 to reflect change in status)  Patient stated goal: to go home  Patient will complete supine to sit in prep for ADLs: Supervision  Patient will complete toilet transfer: Contact Guard assistance  Patient will complete toileting: Minimal assistance  Patient will complete grooming task: Minimal assistance (in stance)  Patient will complete lower body dressing: Minimal assistance (don pants)  Long Term Goal : Pt will tolerate bathing assessment  Long term goal to be met in: 2 weeks  Collaborated with: Patient  Plan  Plan  Treatment Interventions: ADL retraining, Patient/Family training, Activity Tolerance training, Compensatory technique education, Energy Conservation, Functional transfer training, Therapeutic Activity, IADL retraining, Cognitive reorientation, UE strengthening/ROM, Excercise  OT Frequency: minimum  3x/week  The plan of care and recommendations assesses the patient's and/or caregiver's readiness, willingness, and ability to provide or support functional mobility and ADL tasks as needed upon discharge.    Patient/Family Education  Educated patient and patient's family on the role of occupational therapy, OT goals, OT plan of care, ADL training, and the importance of safety and fall prevention strategies including need for supervision/ assistance with OOB activity and use of call light. patient and patient's family  verbalized understanding.    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    Environmental allergies     Hearing loss     left (since age 23), right more recent    High cholesterol     Hypertension     Neck symptom     Trouble turning head to the left     Sleep apnea     Sleep disorder      Past Surgical History  Past Surgical History:   Procedure Laterality Date    ACHILLES TENDON REPAIR  01/31/2002    left     APPENDECTOMY      HERNIA REPAIR  02/01/1951    INCISION AND DRAINAGE POSTERIOR LUMBAR SPINE N/A 10/30/2021    Procedure: possible lumbar drain;  Surgeon: Tempie Hoist, MD;  Location: [facility] OR;  Service: Neurosurgery;  Laterality: N/A;    IRRIGATION AND DEBRIDEMENT SPINE N/A 10/30/2021    Procedure: CSF leak repair, exploration of the neck;  Surgeon: Tempie Hoist, MD;  Location: [facility] OR;  Service: Neurosurgery;  Laterality: N/A;    KNEE SURGERY Left 1976    SPINE SURGERY      TONSILLECTOMY      WISDOM TOOTH EXTRACTION

## 2021-11-05 NOTE — Unmapped (Signed)
Speech Language Pathology  Modified Barium Swallow Study  Name: Matthew [name]  DOB: [birth year]  Attending Physician: Tempie Hoist, MD  Admission Diagnosis: CSF leak [G96.00]  Postoperative CSF leak [G97.82, G96.00]  Date: 11/05/2021  Reviewed Pertinent hospital course: Yes  Hospital Course SLP: 77 y/o male s/p C3-4 ACDF on 9/19 complicated by intraoperative CSF leak presents with progressive R neck swelling concerning for continued CSF leak. 9/30: underwent CSF leak repair and neck exploration.  Imaging: No recent chest imaging.  SLP Hx: 9/20: seen for bedside swallow evaluation with recs for regular diet with thin liquids.      Assessment: Patient presents with severe oropharyngeal dysphagia secondary to reduced hyolaryngeal elevation with absent anterior excursion, absent epiglottic inversion, reduced base of tongue retraction, poor glottic closure, reduced pharyngeal stripping wave, and reduced duration and distention of UES opening. Pt's swallow function was complicated by recent ACDF surgery (C3-4) w/ CSF leak, weak cough, and reduced laryngeal sensation. This resulted in silent aspiration of thin liquids, nectar-thick liquids, honey-thick liquids, and puree. Aspiration of above consistencies occurred during and after swallow initiation. Moderate to severe pharyngeal residue noted after the swallow with all consistencies. New boluses mixed with previous residue and saliva which was subsequently aspirated during secondary swallows. Pt with reduced neck mobility, limiting his ability to complete postural maneuvers. R head turn attempted and was not effective in reducing aspiration. Pt continues to have reduced cough strength. Cued cough ejected aspirated material back into laryngeal vestibule, but was then immediately aspirated. Swallow efficiency is reduced as pt required >4 swallows for 1/2 tsp of puree and >6 swallows for single piece of soft solid. Cues for effortful swallow were not beneficial in  reducing severity of pharyngeal residue. Over the course of a meal, it is suspected that pt would be at significant risk for continual aspiration given his inability to clear material from his pharynx. Anterior cervical hardware noted at C3-4. Shelf-like protrusion at level of C5 extending out of image c/f osteophytes. Recommend NPO with consideration for long-term means of alternative nutrition given pt's impaired swallow safety and efficiency.     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 Recommended NPO w/ TF.    Orientation:  Person: Yes  Place: Yes  Time: Yes  Situation: Yes    Pain:  Pain Score:  0 - No Pain  Pain Location: Back    Trials Administered & Pen Asp Scale Score  Pen Asp   Score            Description of Events  1.            Material does not enter airway  2.            Material enters the airway, remains above the vocal folds,                  and is ejected from the airway.  3.            Material enters the airway, remains above the vocal folds,                   and is not ejected from the airway.  4             Material enters the airway, contacts the vocal folds,                  and is ejected from the airway.  5.            Material enters the airway, contacts the vocal folds,                  and is not ejected from the airway.  6.            Material enters the airway, passes below the vocal folds,                  and is ejected into the larynx or out of the airway.  7.            Material enters the airway, passes below the vocal folds,                 and is not ejected from the trachea despite effort.  8.           Material enters the airway, passes below the vocal folds,                 and no effort is made to eject.      Thin  Order of Trials: 1-3  Pen/Asp Scale Score: MAX: 8  Pen/Asp Scale Score: MOST: 8  Presentation: Spoon, Straw    Nectar Thick  Order of Trials: 4-5, 8  Pen/Asp Scale Score: MAX: 8  Pen/Asp Scale Score: MOST: 8  Presentation: Spoon, Straw    Honey Thick  Order of Trials: 6-7, 9  Pen/Asp Scale Score: MAX: 8  Pen/Asp Scale Score: MOST: 8  Presentation: Spoon, Straw    Puree  Order of Trials: 9  Pen/Asp Scale Score: 8  Presentation: Spoon    Soft Solid  Order of Trials: 10  Pen/Asp Scale Score: 1  Presentation: Spoon    Oral Phase  Lip Closure: no labial escape  Tongue Control/Bolus Hold: adequate bolus control  Bolus/Prep/Mastication: timely and efficient chewing and mashing  Oral Residue: complete oral clearance  Onset of Pharyngeal Swallow: bolus head at posterior angle of ramus    Pharyngeal Phase  Soft Palate Elevation: no bolus between soft palate (SP) /pharyngeal wall (PW)  Base of Tongue Retraction: reduced  Hyolaryngeal Elevation/Excursion: reduced  Epiglottic Movement: no inversion  Laryngeal Vestibule Closure: incomplete  Pharyngeal Contraction: reduced  Pharyngeal Residue: residue within/ on pharyngeal structures  Laryngeal Penetration: observed  Laryngeal Penetration- Observed: during swallow initiation,  after swallow  initiation  Aspiration: observed  Aspiration - Observed: silent, cued cough ineffective, after swallow initiation, during swallow initiation  Compensatory Strategies Trialed: R head turn, cough reswallow    Goals  Goals Short Term Dysphagia  Patient will demonstrate use of swallowing exercises: for laryngeal closure and pharyngeal constriction with 90% acc given min cues.  [name] will demonstrate understanding of clinical signs and symptoms of aspiration vs. silent aspiration at 80% accuracy: given min cues.  [name] will demonstrate understanding of risks associated with aspiration at 100% accuracy: given min cues.  Time frame for goals to be met in: 10/13    Goals Long Term Dysphagia  Patient will tolerate the least restrictive diet as determined via instrumental assessment with 100% accuracy independently: with 100% acc independently.  Time frame for goals to be met in: 10/20    Patient and Family Education  Patient and Family Education: Patient educated on, instrumental procedure, role of SLP, current POC, dysphagia, swallowing anatomy & physiology, risks of aspiration, results of instrumental examination  Education response: Patient demonstrated understanding    Milus Mallick, MS, CCC-SLP  Speech-Language Pathologist  [facility]  [phone]    Time  Start Time: [phone]  Stop Time: 0845  Time Calculation (min): 29 min  Charges   $ MBS Flouro Eval: 1 Procedure  Patient Class   Inpatient

## 2021-11-05 NOTE — Unmapped (Addendum)
[facility]  Case Management/Social Work Department  Progress Note    Patient Information     Patient Name: Matthew [name]  MRN: [id number]  Hospital day: 7  Inpatient/Observation:  Inpatient   Level of Care:   Admit date:  10/29/2021  Admission diagnosis: CSF leak [G96.00]  Postoperative CSF leak [G97.82, G96.00]    PMH:  has a past medical history of Cancer (CMS-HCC), Environmental allergies, Hearing loss, High cholesterol, Hypertension, Neck symptom, Sleep apnea, and Sleep disorder.    PCP:  Demetrius Charity, MD    Home Pharmacy:    [facility] [address], [place] - [address]  [address] [facility]  [place] [address]  Phone: [phone]     [facility]  [address]  [place] [address]  Phone: [phone]         Medical Insurance Coverage:  Payor: MEDICARE / Plan: MEDICARE A AND B / Product Type: Medicare /     Other Pertinent Information     SW completed chart review and attended interdisciplinary rounds with NSGY. PT/OT recommends IPR. Patient is not medically ready pending MBS and those results.     1315 - Matthew [name]/[facility] [phone]) called SW and stated due to pt failing another MBS they can't accept him in his current state and asked when the PEG would be placed. SW informed the team. The team stated they believe his dysphagia will improve so he will not require a PEG. The pt will also be scheduled for an outpatient appointment for re-eval with ENT.     SW received a call from [facility] stating the director communicated Matthew [name] is no longer able to accept the pt.     SW called Matthew [name]/[facility] [phone]) asking if they would review the pt again due to [facility] IPR declining and [facility] was the families second choice. Matthew [name] confirmed they too could not accept the pt because of the NG tube.     SW called Matthew [name]/[facility] and explained the situation. Matthew [name] stated that it did not sound like they could accept him but asked to see a referral before making a final  decision.     SW met with pt and spouse and provided update on [facility] and [employer]. SW offered the IPR list but the spouse refused. Spouse stated she only wanted [facility] IPR and would not review the list. Spouse said the pt will have to stay in the hospital until he is able to swallow better and [facility] accepts him. SW informed the team.     Discharge Plan     Anticipated discharge plan: IPR     Anticipated discharge date:  10/9, pending medical trajectory     CM/SW will continue to follow and remain available for discharge planning needs.      Matthew [name], MSW, [place]  [phone]

## 2021-11-05 NOTE — Unmapped (Signed)
Problem: Acute Pain  Description: Patient's pain progressing toward patient's stated pain goal  Goal: Patient will manage pain with the appropriate technique/intervention  Description: Assess and monitor patient's pain using appropriate pain scale. Collaborate with interdisciplinary team and initiate plan and interventions as ordered.  Re-assess patient's pain level 30-60 minutes after pain management intervention.  Outcome: Progressing     Problem: Safety  Goal: Patient will be injury free during hospitalization  Description: Assess and monitor vitals signs, neurological status including level of consciousness and orientation. Assess patient's risk for falls and implement fall prevention plan of care and interventions per hospital policy.      Ensure arm band on, uncluttered walking paths in room, adequate room lighting, call light and overbed table within reach, bed in low position, wheels locked, side rails up per policy, and non-skid footwear provided.   Outcome: Progressing     Problem: Patient will remain free of falls  Goal: Universal Fall Precautions  Outcome: Not Progressing   Unwitnessed fall out of bed overnight. Bed alarm in place.

## 2021-11-05 NOTE — Unmapped (Signed)
[facility]  DEPARTMENT OF NEUROSURGERY  INPATIENT PROGRESS NOTE  Matthew [name]  [id number]  [birth year]    HD: 8 POD: 6    Neurosurgery Attending: Vella Redhead, MD    Interval History:  - No acute events overnight  - Larey Seat out of bed, did not hit head  - SLP re-evaluated with no progress in diet    Meds:  Current Facility-Administered Medications   Medication Dose Frequency Provider Last Admin    acetaminophen  650 mg Q6H Vickie Epley, MD 650 mg at 11/05/21 5409    amLODIPine  5 mg Daily 0900 Leontine Locket, MD 5 mg at 11/04/21 0810    AMOXicillin-clavulanate  1 tablet 2 times per day Vickie Epley, MD 1 tablet at 11/04/21 2201    atorvastatin  40 mg Nightly (2100) Leontine Locket, MD 40 mg at 11/04/21 2200    baclofen  10 mg Daily PRN Leontine Locket, MD 10 mg at 11/04/21 2200    bisacodyL  10 mg Daily PRN Leontine Locket, MD 10 mg at 11/03/21 1815    brimonidine  1 drop BID Leontine Locket, MD 1 drop at 11/04/21 2202    cetirizine  10 mg Daily 0900 Leontine Locket, MD 10 mg at 11/04/21 0810    doxepin  10 mg Nightly (2100) Leontine Locket, MD 10 mg at 11/04/21 2201    electrolyte  75 mL/hr Continuous Clarise Cruz, MD 75 mL/hr at 11/04/21 1517    finasteride  5 mg Daily 0900 Leontine Locket, MD 5 mg at 11/04/21 8119    gabapentin  600 mg TID Leontine Locket, MD 600 mg at 11/04/21 2200    heparin  5,000 Units 3 times per day Vickie Epley, MD 5,000 Units at 11/05/21 1478    hydrOXYzine HCL  25 mg 4x Daily PRN Derrill Center, CNP 25 mg at 11/04/21 2200    latanoprost  1 drop Nightly (2100) Leontine Locket, MD 1 drop at 11/04/21 2202    melatonin  3 mg Nightly (2100) Robin Searing, MD 3 mg at 11/04/21 2200    ondansetron  4 mg Q6H PRN Leontine Locket, MD 4 mg at 11/02/21 2956    oxyCODONE  5 mg Q4H PRN Christiana Cornea, MD 5 mg at 11/04/21 2159    Or    oxyCODONE  10 mg Q4H PRN Christiana Cornea, MD 10 mg at 11/03/21 1035    senna-docusate  1 tablet BID Leontine Locket, MD 1 tablet at 11/04/21 2200    timolol  1 drop  Q12H Leontine Locket, MD 1 drop at 11/04/21 2210       Examination:  Temp:  [96 F (35.6 C)-98.8 F (37.1 C)] 98.8 F (37.1 C)  Heart Rate:  [64-92] 91  Resp:  [13-19] 18  BP: (127-176)/(76-94) 176/93    General:   Wound: c/d/I, small superficial hematoma near incision, continues to improve    Neurological  -GCS: 15  -Orientation: alert, oriented x 3 (person, place, and time)  -Language: speech fluent and appropriate  -Cranial Nerves: PERRL, EOMI, face symmetric, hearing intact, tongue midline   -Motor: FCCx4, full and symmetric strength and ROM    D EF EE HG IO   RUE:    5 5 5 5 5    LUE:     5 5 5 5 5     HF KE DF EHL PF   RLE: 5 5 5 5  5  LLE: 5 5 5 5 5   -Sensation: sensation intact to light touch and symmetric in bilateral upper and lower extremities  -Reflex: no hyperreflexia or clonus appreciated, Hoffman's absent BL, babinski downgoing BL  -Tone: No abnormality of tone appreciated in bilateral upper or lower extremities.  -Coordination:No bilateral upper extremity drift; no dysmetria on finger-to-nose or finger-nose-finger assessments.     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 Patient underwent a C3-4 ACDF on 09/19 with intraoperative CSF leak. Patient admitted now with concern for continued CSF leak.     PLAN:  - Admitted to nsgy; Dr. Vella Redhead attending  - LD@clamped ; DC today   - continue to monitor urine output  - HOB >45 degress at all times  - continue home meds post-op  - pain control  - bowel reg  - NGT currently in place   - needs daily speech evaluations  -  Diet NPO pending speech reevaluations  - DVT prophylaxis: SCDs/SQH   - Dispo: pending recovery    This note was copied forward from the note  written by Rinaldo Cloud.  I have reviewed and updated the history, physical exam, data, assessment and plan of the note so it reflects the evaluation and management of the patient on today's date.     Please page [phone] with any questions or concerns.     Lorana Maffeo L. Charlesetta Garibaldi, MD   Neurosurgery Resident   7:32 AM 11/05/2021

## 2021-11-05 NOTE — Unmapped (Signed)
[facility]   Medical Nutrition Therapy  Follow-Up    Diet Order/Nutrition Support: Regular; Peptamen AF @ 20-60 ml/hr    Chief Complaint: Postoperative CSF leak     Pertinent Information: Matthew [name] is a 76 y.o. 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 Patient underwent a C3-4 ACDF on 09/19 with intraoperative CSF leak. Patient admitted now with concern for continued CSF leak.     S/p MBS this morning with SLP recommendations for NPO with consideration for long-term means of alternative nutrition given pt's impaired swallow safety and efficiency.     Peptamen AF remains ordered, infusing per flowsheet at 60 ml/hr and tolerating. Recommend changing to Isosource 1.5, initiation at 20 ml/hr with advancement as tolerate until goal of 60 ml/hr is reached (provides 2160 kcal, 98 g pro, 253 g CHO, and 1100 ml free water)  +BM 10/05    LOS: 7 days  Scheduled Meds:    acetaminophen  650 mg Oral Q6H    amLODIPine  5 mg Oral Daily 0900    AMOXicillin-clavulanate  1 tablet Oral 2 times per day    atorvastatin  40 mg Oral Nightly (2100)    brimonidine  1 drop Right Eye BID    cetirizine  10 mg Oral Daily 0900    doxepin  10 mg Oral Nightly (2100)    finasteride  5 mg Oral Daily 0900    gabapentin  600 mg Oral TID    heparin  5,000 Units Subcutaneous 3 times per day    latanoprost  1 drop Ophthalmic Nightly (2100)    melatonin  3 mg Oral Nightly (2100)    senna-docusate  1 tablet Oral BID    timolol  1 drop Both Eyes Q12H      Continuous Infusions:   electrolyte 75 mL/hr (11/04/21 1517)      PRN Meds:baclofen, bisacodyL, hydrOXYzine HCL, ondansetron, oxyCODONE **OR** oxyCODONE     Pertinent Labs:   Lab Results   Component Value Date    CREATININE 0.64 11/05/2021    BUN 16 11/05/2021    NA 143 11/05/2021    K 3.6 11/05/2021    CL 105 11/05/2021    CO2 28 11/05/2021      Lab Results   Component Value Date    ALBUMIN 3.6 11/05/2021     Lab Results   Component Value Date    CALCIUM 9.5 11/05/2021    PHOS 2.4 11/05/2021     Lab Results   Component Value Date    MG 2.0 11/01/2021     Lab Results   Component Value Date    POCGMD 134 (H) 10/30/2021       Weight History:  Wt Readings from Last 5 Encounters:   10/30/21 175 lb (79.4 kg)   10/29/21 171 lb (77.6 kg)   10/29/21 171 lb (77.6 kg)   10/27/21 174 lb (78.9 kg)   10/26/21 174 lb (78.9 kg)     Established Estimated Nutrition Needs:  Needs based On: CBW (79 kg)  Kcals/day: 1975-2370 (25-30 kcal/kg)  Protein g/day: 95-103 (1.2-1.3 g/kg)  Carbohydrate g/day: No hx of DM  Fluid ml/day: ~1 ml/kcal or per MD    Established Nutrition Diagnosis  Nutrition Diagnosis: Inadequate oral intake  Related To: Dysphagia  As Evidenced By: Need for enteral nutrition    Established Nutrition Intervention: Modify/Continue Enteral  Nutrition   Established Goals: Tolerate enteral nutrition within 1-2 days   Goals: Not Met - ongoing    Nutrition Status Classification: Moderately Compromised    Discharge Planning and Education: Discharge plan of care for nutrition pending clinical course.    Follow up per nutrition services protocol while inpatient.    Additional Recommendation(s) to Physicians:     Recommend changing to Isosource 1.5, initiation at 20 ml/hr with advancement as tolerate until goal of 60 ml/hr is reached (provides 2160 kcal, 98 g pro, 253 g CHO, and 1100 ml free water)    Free water bolus of 176 ml every 4 hours to maintain hydration in absence of IV fluids (or per MD)    Monitor TF tolerance, weight, nutrition-related labs, and POC     Clovis Fredrickson, RD, LD  Clinical Dietitian  Pager# [phone]

## 2021-11-05 NOTE — Unmapped (Signed)
[facility]  Case Management/Social Work Department  Progress Note    Patient Information     Patient Name: Matthew [name]  MRN: [id number]  Hospital day: 7  Inpatient/Observation:  Inpatient   Level of Care:  Neurosurgery  Admit date:  10/29/2021  Admission diagnosis: CSF leak [G96.00]  Postoperative CSF leak [G97.82, G96.00]    PMH:  has a past medical history of Cancer (CMS-HCC), Environmental allergies, Hearing loss, High cholesterol, Hypertension, Neck symptom, Sleep apnea, and Sleep disorder.    PCP:  Demetrius Charity, MD    Home Pharmacy:    [facility] [address], [place] - [address]  [address] [facility]  [place] [address]  Phone: [phone]     [facility]  [address]  [place] [address]  Phone: [phone]         Medical Insurance Coverage:  Payor: MEDICARE / Plan: MEDICARE A AND B / Product Type: Medicare /     Other Pertinent Information     SW Supervsisor received a call from [phone] stating pending MBS and feeding plan likely can accept today.  SW Supervisor informed MBS is scheduled at 8:30a.m. this morning.  SW Supervisor updated SW Travis/SW Sophia and SW [place] of this information.  SW to follow.    Discharge Plan     Anticipated discharge plan: [facility]     Anticipated discharge date:  11/05/2021 (oending MBS results and nutrition plan)     CM/SW will continue to follow and remain available for discharge planning needs.      Maeola Sarah, MSW, LISW-S     Cell [phone]

## 2021-11-05 NOTE — Unmapped (Signed)
Pt had MBSS today w/ feeding tube recs. Family does not want to do a peg tube at this time and does not feel that the pt is stable enough to go to rehab while still getting over his delirium. Followed up MD to reach out to daughter personally to go over further plan for pt. No PRNs given, pts delirium seems to be slowly improving over the shift today.     Problem: Acute Pain  Description: Patient's pain progressing toward patient's stated pain goal  Goal: Patient will manage pain with the appropriate technique/intervention  Description: Assess and monitor patient's pain using appropriate pain scale. Collaborate with interdisciplinary team and initiate plan and interventions as ordered.  Re-assess patient's pain level 30-60 minutes after pain management intervention.  Outcome: Not Progressing  Goal: Patient will reduce or eliminate use of analgesics  Outcome: Not Progressing  Goal: Patients pain is managed to allow active participation in daily activities  Outcome: Not Progressing  Goal: Patient verbalizes a reduction in pain level  Outcome: Not Progressing     Problem: Safety  Goal: Patient will be injury free during hospitalization  Description: Assess and monitor vitals signs, neurological status including level of consciousness and orientation. Assess patient's risk for falls and implement fall prevention plan of care and interventions per hospital policy.      Ensure arm band on, uncluttered walking paths in room, adequate room lighting, call light and overbed table within reach, bed in low position, wheels locked, side rails up per policy, and non-skid footwear provided.   Outcome: Not Progressing  Goal: Patient with weight > 350lbs will have appropriate equipment  Description: Consider ordering Bariatric Bed, Chair and Bedside Commode for patient weight > 350 lbs.  Outcome: Not Progressing     Problem: Patient will remain free of falls  Goal: Universal Fall Precautions  Outcome: Not Progressing     Problem:  Daily Care  Goal: Daily care needs are met  Description: Assess and monitor ability to perform self care and identify potential discharge needs.  Outcome: Not Progressing     Problem: Psychosocial Needs  Goal: Demonstrates ability to cope with hospitalization/illness  Description: Assess and monitor patients ability to cope with his/her illness.  Outcome: Not Progressing  Goal: Collaborate with patient/family to identify patient's goals  Outcome: Not Progressing     Problem: Discharge Barriers  Goal: Patient's discharge needs are met  Description: Collaborate with interdisciplinary team and initiate plans and interventions as needed.   Outcome: Not Progressing     Problem: Patient will remain free of injury d/t fall  Goal: High Fall Risk Precautions  Outcome: Not Progressing  Goal: Additional High Fall Risk Precautions (consider the following)  Outcome: Not Progressing     Problem: Inadequate Gas Exchange  Goal: Patient is adequately oxygenated and ventilation is improved  Description: Assess and monitor vital signs, oxygen saturation, respiratory status to include rate, depth, effort, and lung sounds, mental status, cyanosis, and labs (ABG's).  Monitor effects of medications that may sedate the patient.  Collaborate with respiratory therapy to administer medications and treatments.  Outcome: Not Progressing

## 2021-11-05 NOTE — Unmapped (Signed)
This RN responded to patient bed alarm going off. Patient found on ground. This RN as well as Environmental education officerx2 RN and x1 PCA assisted patient back to bed. Vitals obtained. MD notified.

## 2021-11-05 NOTE — Unmapped (Signed)
Physical Therapy   Reason Patient Not Seen     Name: Matthew MacleodRobert Idler  DOB: [birth year]  Attending Physician: Tempie HoistBenjamin Motley, MD  Admission Diagnosis: CSF leak [G96.00]  Postoperative CSF leak [G97.82, G96.00]  Date: 11/05/2021  Precautions: Precautions: none  Reviewed Pertinent hospital course: No    Unable to see patient due to: Therapist schedule conflict this date.  Will follow up as schedule permits for therapy.  Thanks.

## 2021-11-06 LAB — RENAL FUNCTION PANEL W/EGFR
Albumin: 3.8 g/dL (ref 3.5–5.7)
Anion Gap: 11 mmol/L (ref 3–16)
BUN: 18 mg/dL (ref 7–25)
CO2: 27 mmol/L (ref 21–33)
Calcium: 9.3 mg/dL (ref 8.6–10.3)
Chloride: 105 mmol/L (ref 98–110)
Creatinine: 0.74 mg/dL (ref 0.60–1.30)
EGFR: >90
Glucose: 132 mg/dL (ref 70–100)
Osmolality, Calculated: 300 mOsm/kg (ref 278–305)
Phosphorus: 3.2 mg/dL (ref 2.1–4.5)
Potassium: 3.8 mmol/L (ref 3.5–5.3)
Sodium: 143 mmol/L (ref 133–146)

## 2021-11-06 LAB — CBC
Hematocrit: 33 % — ABNORMAL LOW (ref 38.5–50.0)
Hemoglobin: 11.4 g/dL — ABNORMAL LOW (ref 13.2–17.1)
MCH: 32.4 pg (ref 27.0–33.0)
MCHC: 34.6 g/dL (ref 32.0–36.0)
MCV: 93.6 fL (ref 80.0–100.0)
MPV: 8.6 fL (ref 7.5–11.5)
Platelets: 200 10E3/uL (ref 140–400)
RBC: 3.53 10E6/uL — ABNORMAL LOW (ref 4.20–5.80)
RDW: 13 % (ref 11.0–15.0)
WBC: 9.4 10E3/uL (ref 3.8–10.8)

## 2021-11-06 MED ORDER — QUEtiapine (SEROQUEL) tablet 25 mg
50 | Freq: Once | ORAL | Status: AC
Start: 2021-11-06 — End: 2021-11-06
  Administered 2021-11-07: 03:00:00 25 mg

## 2021-11-06 MED FILL — GABAPENTIN 300 MG CAPSULE: 300 300 MG | ORAL | Qty: 2

## 2021-11-06 MED FILL — HEPARIN (PORCINE) 5,000 UNIT/ML INJECTION SOLUTION: 5000 5,000 unit/mL | INTRAMUSCULAR | Qty: 1

## 2021-11-06 MED FILL — METHOCARBAMOL 500 MG TABLET: 500 500 MG | ORAL | Qty: 1

## 2021-11-06 MED FILL — STIMULANT LAXATIVE PLUS 8.6 MG-50 MG TABLET: 8.6-50 8.6-50 mg | ORAL | Qty: 1

## 2021-11-06 MED FILL — AMOXICILLIN 500 MG-POTASSIUM CLAVULANATE 125 MG TABLET: 500-125 500-125 mg | ORAL | Qty: 1

## 2021-11-06 MED FILL — AMLODIPINE 5 MG TABLET: 5 5 MG | ORAL | Qty: 1

## 2021-11-06 MED FILL — DOXEPIN 10 MG CAPSULE: 10 10 MG | ORAL | Qty: 1

## 2021-11-06 MED FILL — TYLENOL 325 MG TABLET: 325 325 mg | ORAL | Qty: 2

## 2021-11-06 MED FILL — CETIRIZINE 10 MG TABLET: 10 10 MG | ORAL | Qty: 1

## 2021-11-06 MED FILL — SEROQUEL 50 MG TABLET: 50 50 mg | ORAL | Qty: 1

## 2021-11-06 MED FILL — ATORVASTATIN 20 MG TABLET: 20 20 MG | ORAL | Qty: 2

## 2021-11-06 MED FILL — MELATONIN 3 MG TABLET: 3 3 mg | ORAL | Qty: 1

## 2021-11-06 MED FILL — OXYCODONE 5 MG TABLET: 5 5 MG | ORAL | Qty: 1

## 2021-11-06 NOTE — Unmapped (Signed)
Inpatient Discharge Summary     Patient: Matthew [name]  Age: 76 y.o.    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       Operations/Procedures Performed (include dates)     Surgeries:  Surgical/Procedural Cases on this Admission       Case IDs Date Procedure Surgeon Location Status    [id number] 10/30/21 CSF leak repair, exploration of the neck Tempie Hoist, MD [facility]            Lines and tubes:  Patient Lines/Drains/Airways Status       Active Line / PIV Line       None                    Other Procedures / Pertinent Imaging:  X-ray Shoulder Left min 2-views   Final Result   IMPRESSION:      Moderate AC joint osteoarthritis.      Report Verified by: Duncan Dull, MD at 11/07/2021 9:19 AM EDT      FL MODIFIED BARIUM SWALLOW, INC SCOUT/DELAYED IMAGES w SPEECH Therapy   Final Result   IMPRESSION:      1.  Penetration and silent aspiration to thin liquids.    2.  Deep penetration of residual barium substances noted on subsequent swallows.       Please correlate with the speech pathology report for additional details.      Approved by Laurence Aly, MD on 11/05/2021 9:59 AM EDT      I have personally reviewed the images and I agree with this report.      Report Verified by: Roseanne Kaufman, MD at  11/05/2021 10:27 AM EDT      XR Feeding Tube Check   Final Result   FINDINGS/IMPRESSION:       1.  Placement of weighted enteric tube, tip likely in the gastric fundus with inner stylet in place.         Report Verified by: Arville Care, MD at 11/02/2021 4:47 PM EDT      FL MODIFIED BARIUM SWALLOW, INC SCOUT/DELAYED IMAGES w SPEECH Therapy   Final Result   IMPRESSION:      Laryngeal penetration and tracheal aspiration of thin and thick liquids.    Residual pooling of contrast material with subsequent silent aspiration on each swallow, independent of barium thickness.       Please correlate with the speech pathology report for additional details.  Approved by Laurence Aly, MD on 11/01/2021 4:43 PM EDT      I have personally reviewed the images and I agree with this report.      Report Verified by: Norberta Keens, MD at 11/01/2021 4:59 PM EDT      CT Neck WO contrast   Final Result   IMPRESSION:      1.  Mild interval decrease in size of the multilobulated prevertebral/right neck fluid collection arising from the C3-C4 surgical site.               Approved by Kate Sable, MD on 11/01/2021 4:22 PM EDT      I have personally reviewed the images and I agree with this report.      Report Verified by: Burt Ek, MD at 11/01/2021 4:39 PM EDT      X-ray Cervical Spine 2 or 3-views   Final Result   IMPRESSION:      1.  Prevertebral soft tissue swelling and asymmetric soft tissue swelling along the right neck status post recent neck dissection and CSF repair. This is indeterminate for postoperative fluid/sweeling or recurrent CSF leak which may be further evaluated on the pending CT neck.   2.  Postsurgical changes of C3-C4 ACDF. No acute hardware complication.         Approved by Herby Abraham, DO on 11/01/2021 3:51 PM EDT      I have personally reviewed the images and I agree with this report.      Report Verified by: Particia Nearing, MD at 11/01/2021 4:34 PM EDT      X-ray Cervical Spine 2 or 3-views   Final Result    IMPRESSION:       Intraoperative fluoroscopy was performed. Correlate with procedural note for additional information.          Report Verified by: Shelba Flake, MD at 10/30/2021 2:51 PM EDT      Fluoro up to 1 hour   Final Result   IMPRESSION:       Intraoperative fluoroscopy was performed. Correlate with procedural note for additional information.          Report Verified by: Shelba Flake, MD at 10/30/2021 2:51 PM EDT      CT Neck WO contrast   Final Result   IMPRESSION:      1.  Nonspecific large multilobulated prevertebral/right neck fluid collection emanating from the surgical site at C3-C4. This could represent a postoperative seroma/serosanguineous collection though CSF leak and collection is a consideration given the reported intraoperative CSF leak.   2.  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               Report Verified by: Ezzie Dural, DO at 10/29/2021 9:47 PM EDT      FL MODIFIED BARIUM SWALLOW, INC SCOUT/DELAYED IMAGES w SPEECH Therapy    (Results Pending)         Consulting Services (include reason)   PT   OT   Case management   SLP   ENT         Allergies     Allergies   Allergen Reactions    Animal Dander      Causes congestion    Adhesive Tape-Silicones Rash       Discharge Medications        Medication List        TAKE these medications, which are NEW        Quantity/Refills   gabapentin  100 MG capsule  Commonly known as: NEURONTIN  Take 2 capsules (200 mg total) by mouth 3 times a day.  Replaces: gabapentin 600 MG tablet   Quantity: 90 capsule  Refills: 0     melatonin 3 mg Tab  Take 3 tablets (9 mg total) by mouth at bedtime.   Refills: 0     methocarbamoL 500 MG tablet  Commonly known as: ROBAXIN  Take 1 tablet (500 mg total) by mouth 3 times a day as needed.   Quantity: 90 tablet  Refills: 0     QUEtiapine 25 MG tablet  Commonly known as: SEROQUEL  Take 1 tablet (25 mg total) by mouth at bedtime.   Refills: 0            TAKE these medication, which have CHANGED         Quantity/Refills   acetaminophen 325 MG tablet  Commonly known as: TYLENOL  Take 2 tablets (650 mg total) by mouth every 6 hours.  What changed:   how much to take  when to take this  reasons to take this   Quantity: 30 tablet  Refills: 0            TAKE these medications, which you were ALREADY TAKING        Quantity/Refills   albuterol 90 mcg/actuation inhaler  Commonly known as: PROVENTIL  Inhale 2 puffs into the lungs every 6 hours as needed for Wheezing or Shortness of Breath.   Refills: 0     ALPHA LIPOIC ACID ORAL  Take 1 capsule by mouth daily.   Refills: 0     amLODIPine 5 MG tablet  Commonly known as: NORVASC  Take 1 tablet (5 mg total) by mouth daily.   Refills: 0     azelastine 137 mcg (0.1 %) nasal spray  Commonly known as: ASTELIN  Use 2 sprays into each nostril 2 times a day.   Quantity: 30 mL  Refills: 6     baclofen 10 MG tablet  Commonly known as: LIORESAL  Take 1 tablet (10 mg total) by mouth daily as needed (Muscle spasms).   Refills: 0     bimatoprost 0.01 % Drop  Commonly known as: LUMIGAN  Place 1 drop into both eyes daily.   Refills: 0     brimonidine 0.2 % ophthalmic solution  Commonly known as: ALPHAGAN  Place 1 drop into the right eye 2 times a day.   Refills: 0     cetirizine 10 MG tablet  Commonly known as: ZYRTEC  Take 1 tablet (10 mg total) by mouth daily.   Quantity: 30 tablet  Refills: 11     doxepin 10 MG capsule  Commonly known as: SINEQUAN  Take 1 capsule (10 mg total) by mouth at bedtime as needed.   Refills: 0     finasteride 5 mg tablet  Commonly known as: PROSCAR  Take 0.5 mg by mouth daily.   Refills: 0     fluticasone propionate 50 mcg/actuation nasal spray  Commonly known as: FLONASE  Use 1 spray into each nostril daily.   Refills: 0     naloxone 4 mg/actuation Spry  Commonly known as: NARCAN  Apply 1 spray in one nostril if needed. Call 911. May repeat dose in other nostril if no response in 3 minutes.   Quantity: 2 each  Refills: 1     oxyCODONE 5 MG immediate  release tablet  Commonly known as: ROXICODONE  Take 1  tablet (5 mg total) by mouth every 4 hours as needed for up to 7 days.   Quantity: 30 tablet  Refills: 0     rosuvastatin 10 MG tablet  Commonly known as: CRESTOR  Take 1 tablet (10 mg total) by mouth every evening.   Quantity: 30 tablet  Refills: 5     senna-docusate 8.6-50 mg per tablet  Commonly known as: SENNA-S  Take 1 tablet by mouth 2 times a day.   Quantity: 30 tablet  Refills: 0     tadalafiL 20 MG tablet  Commonly known as: CIALIS  Take 1 tablet (20 mg total) by mouth daily as needed for Erectile Dysfunction.   Refills: 0     timolol 0.5 % ophthalmic solution  Commonly known as: TIMOPTIC  Place 1 drop into both eyes every 12 hours.   Refills: 0            STOP taking these medications      gabapentin 600 MG tablet  Commonly known as: NEURONTIN  Replaced by: gabapentin 100 MG capsule               Where to Get Your Medications        Information about where to get these medications is not yet available    Ask your nurse or doctor about these medications  acetaminophen 325 MG tablet  gabapentin 100 MG capsule  melatonin 3 mg Tab  methocarbamoL 500 MG tablet  oxyCODONE 5 MG immediate release tablet  QUEtiapine 25 MG tablet             Discharge Exam     Physical Exam  General:   Wound: c/d/I     Neurological  -GCS: 15  -Orientation: alert, oriented x 3 (person, place, and time)  -Language: speech fluent and appropriate  -Cranial Nerves: PERRL, EOMI, face symmetric, hearing intact, tongue midline   -Motor: FCCx4, full and symmetric strength and ROM                                D            EF          EE          HG         IO                 RUE:      5             5             5             5             5                  LUE:      5             5             5             5             5                                 HF          KE  DF          EHL       PF                 RLE:      5             5             5             5             5                   LLE:       5             5             5             5             5   -Sensation: sensation intact to light touch and symmetric in bilateral upper and lower extremities  -Reflex: no hyperreflexia or clonus appreciated, Hoffman's absent BL, babinski downgoing BL    Reason for Admission     Timmothy Baranowski is a 76 y.o. 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 Patient underwent a C3-4 ACDF on 09/19 with intraoperative CSF leak. Patient admitted now with concern for continued CSF leak.      Hospital Course     Active Hospital Problems    Diagnosis Date Noted    Postoperative CSF leak [G97.82, G96.00] 11/02/2021    Other dysphagia [R13.19] 11/02/2021      Resolved Hospital Problems   No resolved problems to display.     76 y.o. 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 Patient underwent a C3-4 ACDF on 09/19 with intraoperative CSF leak. Patient admitted now with concern for continued CSF leak; now s/p exploration and repair of CSF leak, lumbar drain placement. lumbar drain was removed on POD5 without concern for leakage from incision. A NG tube was placed for persistent swallowing difficulties. Patient continued to improve throughout hospital stay and eventually cleared for a Dysphagia 1: thinned liquid by teaspoon. He was discharged to IPR.       Condition on Discharge     1. Functional Status: normal   Describe limitations, if any:      2. Mental Status: Alert and oriented    Describe limitations, if any:      3. Dietary Restrictions / Tube Feeding / TPN  Diet/Nutrition Orders    Continuous tube feedings     Frequency: Continuous     Number of Occurrences: Until Specified     Order Questions:      Product Name: Peptamen AF- semi-elemental (equal to  Vital AF)      Strength: FULL STRENGTH      Tube Initial ml/hr: 20      Max Target Rate ml/hr: 60      Comment/Titrate: Increase 25ml q12hrs to target      Tube Feed Location: Nasogastric feeding tube    Dysphagia Level 1: Pureed Thin With T-spoon     Frequency: Effective Now     Number of Occurrences: Until Specified     Order Comments:  With T-spoon       Order Questions:      Fluid consistency: Thin      Suicide/Behavior Risk Modification? No     As listed above    4. Discharge specific orders:   DISCHARGE INSTRUCTIONS AFTER YOUR SPINE SURGERY:     RULES FOR THE CARE OF YOUR INCISION:    You MUST wash your incision with gentle soap and water at least daily, more frequently if soiled or sweaty.      It is okay to shower and get your incision wet. No submerging your incision underwater as in a bathtub, pool or hot tub.    Please leave your incision open to air unless otherwise directed.     DO NOT touch your incision without first washing your hands. Avoid touching your incision as much as possible.    Please call your neurosurgeon's office immediately if you notice any of the following: increased redness, swelling, or any drainage from your incision or if you start running a fever > 101.5    QUESTIONS OR CONCERNS:  If you have a question or concern regarding your care after you get home you can:    Call the Spine Help line [phone].  This line is only available during daytime hours, no weekends or holidays.    Our office number is [phone] or you are welcome to send a message on MyChart.      If you have an urgent concern during an evening, weekend, or holiday call the neurosurgery resident on call. Dial [phone] and ask the operator to have the on call neurosurgery resident called.     ACTIVITY AFTER YOUR SURGERY:     - Progressively increase your activity as tolerated following the recommendations of any inpatient or outpatient postoperative therapy recommendations.  - No bending at waist,  lifting >10lbs, no sudden twisting.    - Nothing more strenuous than daily activities or walking.  No running, jogging, swimming, etc...until cleared by neurosurgeon in outpatient follow up.    - Do not drive unless instructed otherwise by your neurosurgeon.  Avoid driving/operating dangerous or heavy machinery while on pain medications     MEDICATION INSTRUCTIONS:     - Wean off narcotic pain medications as tolerated.    - Recommend vitamin D and calcium supplement during first 8-12 weeks after spinal fusion, if not already taking.  - Hold Aspirin, Plavix, Coumadin, Heparin or other anticoagulant/antiplatelet agents for 2 weeks after surgery unless instructed otherwise.  These medications may increase your risk of bleeding.     - Do not take NSAIDs which includes Ibuprofen/Advil, naproxen/Aleve, Celecoxib/Celebrex.  These should not be taken for 3 months after your surgery.  - Constipation is a common side effect of opiate pain medications; you should take a daily stool softener to help prevent constipation from developing while on narcotic pain medications.  One or two medications should have been provided at discharge to prophylaxis against constipation. Should you experience constipation despite these medications, contact our office should the condition worsen or should you stop passing gas.      Helpful hints after back surgery     ACTIVITY   Walking is encouraged. At least a 5 minute walk every hour during waking hours.   No other form of exercises until follow up appointment   May go up stairs  May ride in a car but no driving until follow up appointment  No sexual activity until follow up appointment  No  heavy lifting over 10 lbs. (milk carton)  No lifting of objects over your head  No cleaning (vacuuming, cutting grass, etc) until follow up appointment   No bending at the waist. Bend with your knees  Use of computer limited to 1 hour 3 times a day    DIET  Dysphagia 1 w/ thin liquids; by teaspoon  Drink  fluids to stay hydrated  Avoid constipation use stool softeners    WOUND  OK to shower but no tub baths  Remove dressing after 2 days  If your incision has steri-strips, let these naturally fall off. If they are still present in one week, you may gently remove them  Any staples or sutures will be removed at your follow-up visit 2 weeks after your surgery  Inspect wound daily for redness or drainage    IF THE INCISION HAS DERMABOND ADHESIVE PLEASE FOLLOW THESE RULES:    1) Do not apply liquid or ointment medications or cleansers onto wounds closed with Dermabond Adhesive, as these substances can weaken the polymerized film leading to skin edge separation.   2)  Do not pick at the wound or film/glue, as this can disrupt its adhesion and cause dehiscence or opening of the wound.   3) Allow the glue to slough naturally (usually in 5-10 days).    4)  There should be only occasional wetting of the wound, but patients may shower and bathe the site gently.  Do not soak or scrub the site or expose it to prolonged wetness until the film has completely sloughed off naturally and the wound has healed closed.      PAIN MEDICATION  You will receive narcotics for 2 weeks and Tylenol for pain  You may receive muscle relaxants for muscle spasms     WHEN TO CALL YOUR DOCTOR  Temperature greater than 101 F  Drainage from the wound site  New onset of weakness or numbness of extremities.   If not previously scheduled, call the office for a follow up appointment for 2 weeks after your surgery [phone])    5. Core measures followed: (if this is a core measure patient)  Discharge Weight: 175 lb (79.4 kg)          Disposition     Inpatient Rehab      Follow-Up Appointments     Future Appointments   Date Time Provider Department Center   01/17/2022  2:00 PM Loura Halt, MD [facility]     No follow-up provider specified.    Signed:    Derrill Center, CNP  11/09/2021, 10:10 AM

## 2021-11-06 NOTE — Unmapped (Signed)
Problem: Patient will remain free of injury d/t fall  Goal: High Fall Risk Precautions  Outcome: Progressing  Goal: Additional High Fall Risk Precautions (consider the following)  Outcome: Progressing

## 2021-11-06 NOTE — Unmapped (Signed)
[facility]  DEPARTMENT OF NEUROSURGERY  INPATIENT PROGRESS NOTE  Phoenyx Paulsen  [id number]  [birth year]    HD: 9 POD: 7    Neurosurgery Attending: Motley, MD    Interval History:  - No acute events overnight    Meds:  Current Facility-Administered Medications   Medication Dose Frequency Provider Last Admin    acetaminophen  650 mg Q6H Vickie Epley, MD 650 mg at 11/06/21 0248    amLODIPine  5 mg Daily 0900 Leontine Locket, MD 5 mg at 11/05/21 0956    AMOXicillin-clavulanate  1 tablet 2 times per day Vickie Epley, MD 1 tablet at 11/05/21 2155    atorvastatin  40 mg Nightly (2100) Leontine Locket, MD 40 mg at 11/05/21 2041    bisacodyL  10 mg Daily PRN Leontine Locket, MD 10 mg at 11/03/21 1815    brimonidine  1 drop BID Leontine Locket, MD 1 drop at 11/05/21 2101    cetirizine  10 mg Daily 0900 Leontine Locket, MD 10 mg at 11/05/21 4403    doxepin  10 mg Nightly (2100) Leontine Locket, MD 10 mg at 11/05/21 2155    finasteride  5 mg Daily 0900 Leontine Locket, MD 5 mg at 11/05/21 4742    gabapentin  600 mg Nightly (2100) Clarise Cruz, MD 600 mg at 11/05/21 2041    heparin  5,000 Units 3 times per day Vickie Epley, MD 5,000 Units at 11/06/21 0504    latanoprost  1 drop Nightly (2100) Leontine Locket, MD 1 drop at 11/05/21 2100    melatonin  3 mg Nightly (2100) Robin Searing, MD 3 mg at 11/05/21 2041    methocarbamoL  500 mg 4x Daily PRN Clarise Cruz, MD 500 mg at 11/06/21 0505    ondansetron  4 mg Q6H PRN Leontine Locket, MD 4 mg at 11/02/21 5956    oxyCODONE  2.5 mg Q4H PRN Clarise Cruz, MD 2.5 mg at 11/05/21 1800    Or    oxyCODONE  5 mg Q4H PRN Clarise Cruz, MD 5 mg at 11/06/21 0505    QUEtiapine  25 mg Nightly (2100) Clarise Cruz, MD 25 mg at 11/05/21 2041    senna-docusate  1 tablet BID Leontine Locket, MD 1 tablet at 11/05/21 2041    timolol  1 drop Q12H Leontine Locket, MD 1 drop at 11/05/21 2101       Examination:  Temp:  [98.6 F (37 C)-99.3 F (37.4 C)] 98.6 F (37 C)  Heart Rate:   [78-87] 86  Resp:  [16-18] 18  BP: (116-132)/(59-92) 132/86    General:   Wound: c/d/I    Neurological  -GCS: 15  -Orientation: alert, oriented x 3 (person, place, and time)  -Language: speech fluent and appropriate  -Cranial Nerves: PERRL, EOMI, face symmetric, hearing intact, tongue midline   -Motor: FCCx4, full and symmetric strength and ROM    D EF EE HG IO   RUE:    5 5 5 5 5    LUE:     5 5 5 5 5     HF KE DF EHL PF   RLE: 5 5 5 5 5    LLE: 5 5 5 5 5   -Sensation: sensation intact to light touch and symmetric in bilateral upper and lower extremities  -Reflex: no hyperreflexia or clonus appreciated, Hoffman's absent BL, babinski downgoing BL    Labs:  Na/K/Cl/CO2:  143/3.8/105/27 (10/06 2348)  WBC/Hgb/Hct/Plts:  9.4/11.4/33.0/200 (10/06 2348)       Imaging Review  No new imaging to review    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 Patient underwent a C3-4 ACDF on 09/19 with intraoperative CSF leak. Patient admitted now with concern for continued CSF leak; now s/p exploration and repair of CSF leak, lumbar drain placement    PLAN:  - LD removed  - HOB >45 degress at all times  - continue home meds   - pain control  - bowel reg  - NGT currently in place   - needs daily speech evaluations  -  Diet NPO pending speech reevaluations  - TF  - delirium precautions  - DVT prophylaxis: SCDs/SQH   - Dispo: medically ready for IPR    This note was copied forward from the note written by Dr. Charlesetta Garibaldi.  I have reviewed and updated the history, physical exam, data, assessment and plan of the note so it reflects the evaluation and management of the patient on today's date.     Please page [phone] with any questions or concerns.     Ardine Bjork, MD  Neurosurgery Resident  6:01 AM 11/06/2021

## 2021-11-06 NOTE — Unmapped (Signed)
Department of Internal Medicine  Medicine Initial Consult Note    Patient: Matthew [name]  MRN: [id number]  CSN: [id number]    Reason for Consult: delirium    Assessment & Recommendations     Matthew [name] is a 76 y.o. 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  He was admitted to the hospital 9/29 for continued CSF leak, and is now s/p exploration and repair of CSF leak, as well as lumbar drain placement. Medicine was consulted for management of delirium. Per primary, patient is medically ready for IPR.      #Delirium, hospital-related   Patient experienced difficulty sleeping, issues with altered sensorium while on baclofen, and higher doses of gabapentin, oxycodone. After these med doses were reduced and patient was on seroquel 25mg  qhs, patient was able to sleep better overnight and feeling better AAOx4 this morning.    - Delirium precautions  - Agree with seroquel 25mg  qhs  - Agree with dose-reductions of oxycodone to 2.5mg -5mg  prn, gabapentin 600mg  qhs  - Agree with discontinuation of baclofen.  - Agree with using robaxin 500mg  prn for muscle spasms  - Sleep care bundle   - Fall precautions  - Frequent orientation   - Avoid restraints  - Minimize tethering devices as clinically able  - Encourage mobilization as tolerated    Thank you for allowing [place] to participate in this pt's care. This case will be staffed with the attending physician Dr. Lowella Bandy. Please feel to call or Epic message with any questions. We will continue to follow with you.    Johnn Hai, MD  Internal Medicine Resident  [facility] [phone]      History of Present Illness     Matthew [name] is a 76 y.o. 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  He was admitted to the hospital 9/29 for continued CSF leak, and is now s/p exploration and repair of CSF leak, as well as lumbar drain placement. Medicine was consulted for management of delirium. Per primary, patient is medically ready for IPR.     Patient and his wife report he slept much better overnight. Primary team reduced doses of gabapentin back to home dose nightly, along with dose-reduction of baclofen and oxycodone. Patient feeling okay this morning, no acute concerns.    Review of Systems     Pertinent positives and negatives as above in HPI.    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    Environmental allergies     Hearing loss     left (since age 87), right more recent    High cholesterol     Hypertension     Neck symptom     Trouble turning head to the left     Sleep apnea     Sleep disorder        Past Surgical History:   Procedure Laterality Date    ACHILLES TENDON REPAIR  01/31/2002    left     APPENDECTOMY      HERNIA REPAIR  02/01/1951  INCISION AND DRAINAGE POSTERIOR LUMBAR SPINE N/A 10/30/2021    Procedure: possible lumbar drain;  Surgeon: Tempie Hoist, MD;  Location: [facility] OR;  Service: Neurosurgery;  Laterality: N/A;    IRRIGATION AND DEBRIDEMENT SPINE N/A 10/30/2021    Procedure: CSF leak repair, exploration of the neck;  Surgeon: Tempie Hoist, MD;  Location: [facility] OR;  Service: Neurosurgery;  Laterality: N/A;    KNEE SURGERY Left 1976    SPINE SURGERY      TONSILLECTOMY      WISDOM TOOTH EXTRACTION         Family History   Problem Relation Age of Onset    Colon Cancer Mother     Heart failure Father        Social History     Socioeconomic History    Marital status: Married     Spouse name: Not on file    Number of children: Not on file    Years of education: Not on file    Highest education level: Not on file   Occupational  History    Not on file   Tobacco Use    Smoking status: Never    Smokeless tobacco: Never   Vaping Use    Vaping Use: Never used   Substance and Sexual Activity    Alcohol use: Yes     Alcohol/week: 4.0 standard drinks     Types: 2 Glasses of wine, 2 Cans of beer per week    Drug use: No     Comment: 02/16/10    Sexual activity: Not on file   Other Topics Concern    Caffeine Use Yes    Occupational Exposure Not Asked    Exercise Yes    Seat Belt Yes   Social History Narrative    Not on file     Social Determinants of Health     Financial Resource Strain: Not on file   Physical Activity: Not on file   Stress: Not on file   Social Connections: Not on file   Housing Stability: Not on file       Medications     Home Meds:  Medications Prior to Admission   Medication Sig Dispense Refill Last Dose    acetaminophen (TYLENOL) 325 MG tablet Take 3 tablets (975 mg total) by mouth every 6 hours as needed (Give 1 hour pre-op). 100 tablet 0 Past Week    amLODIPine (NORVASC) 5 MG tablet Take 1 tablet (5 mg total) by mouth daily.   11/05/2021    azelastine (ASTELIN) 137 mcg (0.1 %) nasal spray Use 2 sprays into each nostril 2 times a day. 30 mL 6 11/05/2021    baclofen (LIORESAL) 10 MG tablet Take 1 tablet (10 mg total) by mouth daily as needed (Muscle spasms).   Past Week    bimatoprost (LUMIGAN) 0.01 % Drop Place 1 drop into both eyes daily.   11/05/2021    brimonidine (ALPHAGAN) 0.2 % ophthalmic solution Place 1 drop into the right eye 2 times a day.   11/05/2021    cetirizine (ZYRTEC) 10 MG tablet Take 1 tablet (10 mg total) by mouth daily. 30 tablet 11 11/05/2021    doxepin (SINEQUAN) 10 MG capsule Take 1 capsule (10 mg total) by mouth at bedtime as needed.   11/05/2021    finasteride (PROSCAR) 5 mg tablet Take 0.5 mg by mouth daily.   11/05/2021    fluticasone propionate (FLONASE) 50 mcg/actuation nasal spray Use 1 spray into each nostril  daily.   Past Week    gabapentin (NEURONTIN) 600 MG tablet Take 1 tablet (600 mg total) by  mouth 3 times a day. (Patient taking differently: Take 1 tablet (600 mg total) by mouth at bedtime.) 270 tablet 1 Past Week    rosuvastatin (CRESTOR) 10 MG tablet Take 1 tablet (10 mg total) by mouth every evening. 30 tablet 5 Past Week    senna-docusate (SENNA-S) 8.6-50 mg per tablet Take 1 tablet by mouth 2 times a day. 30 tablet 0 Past Week    tadalafiL (CIALIS) 20 MG tablet Take 1 tablet (20 mg total) by mouth daily as needed for Erectile Dysfunction.   Past Week    timolol (TIMOPTIC) 0.5 % ophthalmic solution Place 1 drop into both eyes every 12 hours.   11/05/2021    albuterol 90 mcg/actuation Inhl inhaler Inhale 2 puffs into the lungs every 6 hours as needed for Wheezing or Shortness of Breath.   More than a month    ALPHA LIPOIC ACID ORAL Take 1 capsule by mouth daily.   Unknown    naloxone (NARCAN) 4 mg/actuation Spry Apply 1 spray in one nostril if needed. Call 911. May repeat dose in other nostril if no response in 3 minutes. 2 each 1 Unknown    [EXPIRED] oxyCODONE (ROXICODONE) 5 MG immediate release tablet Take 1 tablet (5 mg total) by mouth every 4 hours as needed for up to 20 doses. 20 tablet 0        Physical Exam     Gen: Age-appropriate adult, NAD. Alert.  HEENT: NCAT. EOM grossly intact. No scleral icterus.  CV: RRR, nl S1/S2, no M/G/R.  PULM: CTAB. Normal WOB.  ABD: soft, NT, ND.   EXT: Palpable pulses.   SKIN: Warm and dry.   NEURO: A&O x4, answers questions appropriately, moves all extremities spontaneously.   PSYCH: Appropriate affect, appropriate eye contact.      Diagnostic Studies     Laboratory data and diagnostic studies were reviewed in the EMR

## 2021-11-07 ENCOUNTER — Inpatient Hospital Stay: Admit: 2021-11-07 | Payer: MEDICARE

## 2021-11-07 LAB — CBC
Hematocrit: 36.5 % (ref 38.5–50.0)
Hemoglobin: 12.7 g/dL (ref 13.2–17.1)
MCH: 32.5 pg (ref 27.0–33.0)
MCHC: 34.7 g/dL (ref 32.0–36.0)
MCV: 93.7 fL (ref 80.0–100.0)
MPV: 7.8 fL (ref 7.5–11.5)
Platelets: 244 10*3/uL (ref 140–400)
RBC: 3.89 10*6/uL (ref 4.20–5.80)
RDW: 13.4 % (ref 11.0–15.0)
WBC: 7.6 10*3/uL (ref 3.8–10.8)

## 2021-11-07 LAB — BASIC METABOLIC PANEL
Anion Gap: 11 mmol/L (ref 3–16)
BUN: 21 mg/dL (ref 7–25)
CO2: 26 mmol/L (ref 21–33)
Calcium: 9.7 mg/dL (ref 8.6–10.3)
Chloride: 105 mmol/L (ref 98–110)
Creatinine: 0.67 mg/dL (ref 0.60–1.30)
EGFR: >90
Glucose: 122 mg/dL (ref 70–100)
Osmolality, Calculated: 298 mOsm/kg (ref 278–305)
Potassium: 4.3 mmol/L (ref 3.5–5.3)
Sodium: 142 mmol/L (ref 133–146)

## 2021-11-07 MED ORDER — gabapentin (NEURONTIN) capsule 200 mg
100 | Freq: Three times a day (TID) | ORAL
Start: 2021-11-07 — End: 2021-11-09
  Administered 2021-11-07 – 2021-11-09 (×5): 200 mg via ORAL

## 2021-11-07 MED ORDER — traZODone (DESYREL) half tablet 25 mg
50 | Freq: Every evening | ORAL
Start: 2021-11-07 — End: 2021-11-09
  Administered 2021-11-08 – 2021-11-09 (×2): 25 mg via ORAL

## 2021-11-07 MED ORDER — methocarbamoL (ROBAXIN) tablet 750 mg
500 | Freq: Four times a day (QID) | ORAL | PRN
Start: 2021-11-07 — End: 2021-11-09
  Administered 2021-11-07 – 2021-11-09 (×5): 750 mg via ORAL

## 2021-11-07 MED ORDER — gabapentin (NEURONTIN) capsule 200 mg
100 | Freq: Every evening | ORAL
Start: 2021-11-07 — End: 2021-11-09
  Administered 2021-11-08 – 2021-11-09 (×2): 200 mg via ORAL

## 2021-11-07 MED FILL — SEROQUEL 50 MG TABLET: 50 50 mg | ORAL | Qty: 1

## 2021-11-07 MED FILL — CETIRIZINE 10 MG TABLET: 10 10 MG | ORAL | Qty: 1

## 2021-11-07 MED FILL — AMLODIPINE 5 MG TABLET: 5 5 MG | ORAL | Qty: 1

## 2021-11-07 MED FILL — AMOXICILLIN 500 MG-POTASSIUM CLAVULANATE 125 MG TABLET: 500-125 500-125 mg | ORAL | Qty: 1

## 2021-11-07 MED FILL — OXYCODONE 5 MG TABLET: 5 5 MG | ORAL | Qty: 1

## 2021-11-07 MED FILL — DOXEPIN 10 MG CAPSULE: 10 10 MG | ORAL | Qty: 1

## 2021-11-07 MED FILL — TYLENOL 325 MG TABLET: 325 325 mg | ORAL | Qty: 2

## 2021-11-07 MED FILL — GABAPENTIN 300 MG CAPSULE: 300 300 MG | ORAL | Qty: 2

## 2021-11-07 MED FILL — ATORVASTATIN 20 MG TABLET: 20 20 MG | ORAL | Qty: 2

## 2021-11-07 MED FILL — STIMULANT LAXATIVE PLUS 8.6 MG-50 MG TABLET: 8.6-50 8.6-50 mg | ORAL | Qty: 1

## 2021-11-07 MED FILL — HEPARIN (PORCINE) 5,000 UNIT/ML INJECTION SOLUTION: 5000 5,000 unit/mL | INTRAMUSCULAR | Qty: 1

## 2021-11-07 MED FILL — METHOCARBAMOL 500 MG TABLET: 500 500 MG | ORAL | Qty: 1

## 2021-11-07 MED FILL — GABAPENTIN 100 MG CAPSULE: 100 100 MG | ORAL | Qty: 2

## 2021-11-07 MED FILL — MELATONIN 3 MG TABLET: 3 3 mg | ORAL | Qty: 1

## 2021-11-07 MED FILL — METHOCARBAMOL 500 MG TABLET: 500 500 MG | ORAL | Qty: 2

## 2021-11-07 NOTE — Unmapped (Signed)
Problem: Acute Pain  Description: Patient's pain progressing toward patient's stated pain goal  Goal: Patient will manage pain with the appropriate technique/intervention  Description: Assess and monitor patient's pain using appropriate pain scale. Collaborate with interdisciplinary team and initiate plan and interventions as ordered.  Re-assess patient's pain level 30-60 minutes after pain management intervention.  11/07/2021 0646 by Laurence Ferrari, RN  Outcome: Progressing  11/07/2021 0206 by Laurence Ferrari, RN  Outcome: Progressing  Goal: Patient will reduce or eliminate use of analgesics  11/07/2021 0646 by Laurence Ferrari, RN  Outcome: Progressing  11/07/2021 0206 by Laurence Ferrari, RN  Outcome: Progressing  Goal: Patients pain is managed to allow active participation in daily activities  11/07/2021 0646 by Laurence Ferrari, RN  Outcome: Progressing  11/07/2021 0206 by Laurence Ferrari, RN  Outcome: Progressing  Goal: Patient verbalizes a reduction in pain level  11/07/2021 0646 by Laurence Ferrari, RN  Outcome: Progressing  11/07/2021 0206 by Laurence Ferrari, RN  Outcome: Progressing     Problem: Safety  Goal: Patient will be injury free during hospitalization  Description: Assess and monitor vitals signs, neurological status including level of consciousness and orientation. Assess patient's risk for falls and implement fall prevention plan of care and interventions per hospital policy.      Ensure arm band on, uncluttered walking paths in room, adequate room lighting, call light and overbed table within reach, bed in low position, wheels locked, side rails up per policy, and non-skid footwear provided.   11/07/2021 0646 by Laurence Ferrari, RN  Outcome: Progressing  11/07/2021 0206 by Laurence Ferrari, RN  Outcome: Progressing  Goal: Patient with weight > 350lbs will have appropriate equipment  Description: Consider ordering Bariatric Bed, Chair and Bedside Commode for patient  weight > 350 lbs.  11/07/2021 0646 by Laurence Ferrari, RN  Outcome: Progressing  11/07/2021 0206 by Laurence Ferrari, RN  Outcome: Progressing     Problem: Patient will remain free of falls  Goal: Universal Fall Precautions  11/07/2021 0646 by Laurence Ferrari, RN  Outcome: Progressing  11/07/2021 0206 by Laurence Ferrari, RN  Outcome: Progressing     Problem: Daily Care  Goal: Daily care needs are met  Description: Assess and monitor ability to perform self care and identify potential discharge needs.  11/07/2021 0646 by Laurence Ferrari, RN  Outcome: Progressing  11/07/2021 0206 by Laurence Ferrari, RN  Outcome: Progressing     Problem: Psychosocial Needs  Goal: Demonstrates ability to cope with hospitalization/illness  Description: Assess and monitor patients ability to cope with his/her illness.  11/07/2021 0646 by Laurence Ferrari, RN  Outcome: Progressing  11/07/2021 0206 by Laurence Ferrari, RN  Outcome: Progressing  Goal: Collaborate with patient/family to identify patient's goals  11/07/2021 0646 by Laurence Ferrari, RN  Outcome: Progressing  11/07/2021 0206 by Laurence Ferrari, RN  Outcome: Progressing     Problem: Discharge Barriers  Goal: Patient's discharge needs are met  Description: Collaborate with interdisciplinary team and initiate plans and interventions as needed.   11/07/2021 0646 by Laurence Ferrari, RN  Outcome: Progressing  11/07/2021 0206 by Laurence Ferrari, RN  Outcome: Progressing     Problem: Patient will remain free of injury d/t fall  Goal: High Fall Risk Precautions  11/07/2021 0646 by Laurence Ferrari, RN  Outcome: Progressing  11/07/2021 0206 by Laurence Ferrari, RN  Outcome: Progressing  Goal: Additional High Fall  Risk Precautions (consider the following)  11/07/2021 0646 by Laurence FerrariAngela Denise Debralee Braaksma, RN  Outcome: Progressing  11/07/2021 0206 by Laurence FerrariAngela Denise Elaysha Bevard, RN  Outcome: Progressing     Problem: Inadequate Gas Exchange  Goal: Patient is adequately  oxygenated and ventilation is improved  Description: Assess and monitor vital signs, oxygen saturation, respiratory status to include rate, depth, effort, and lung sounds, mental status, cyanosis, and labs (ABG's).  Monitor effects of medications that may sedate the patient.  Collaborate with respiratory therapy to administer medications and treatments.  11/07/2021 0646 by Laurence FerrariAngela Denise Icesis Renn, RN  Outcome: Progressing  11/07/2021 0206 by Laurence FerrariAngela Denise Shaylin Blatt, RN  Outcome: Progressing

## 2021-11-07 NOTE — Unmapped (Signed)
Problem: Acute Pain  Description: Patient's pain progressing toward patient's stated pain goal  Goal: Patient will manage pain with the appropriate technique/intervention  Description: Assess and monitor patient's pain using appropriate pain scale. Collaborate with interdisciplinary team and initiate plan and interventions as ordered.  Re-assess patient's pain level 30-60 minutes after pain management intervention.  Outcome: Progressing  Goal: Patient will reduce or eliminate use of analgesics  Outcome: Progressing  Goal: Patients pain is managed to allow active participation in daily activities  Outcome: Progressing  Goal: Patient verbalizes a reduction in pain level  Outcome: Progressing     Problem: Safety  Goal: Patient will be injury free during hospitalization  Description: Assess and monitor vitals signs, neurological status including level of consciousness and orientation. Assess patient's risk for falls and implement fall prevention plan of care and interventions per hospital policy.      Ensure arm band on, uncluttered walking paths in room, adequate room lighting, call light and overbed table within reach, bed in low position, wheels locked, side rails up per policy, and non-skid footwear provided.   Outcome: Progressing  Goal: Patient with weight > 350lbs will have appropriate equipment  Description: Consider ordering Bariatric Bed, Chair and Bedside Commode for patient weight > 350 lbs.  Outcome: Progressing     Problem: Patient will remain free of falls  Goal: Universal Fall Precautions  Outcome: Progressing     Problem: Daily Care  Goal: Daily care needs are met  Description: Assess and monitor ability to perform self care and identify potential discharge needs.  Outcome: Progressing     Problem: Psychosocial Needs  Goal: Demonstrates ability to cope with hospitalization/illness  Description: Assess and monitor patients ability to cope with his/her illness.  Outcome: Progressing  Goal: Collaborate  with patient/family to identify patient's goals  Outcome: Progressing     Problem: Discharge Barriers  Goal: Patient's discharge needs are met  Description: Collaborate with interdisciplinary team and initiate plans and interventions as needed.   Outcome: Progressing     Problem: Patient will remain free of injury d/t fall  Goal: High Fall Risk Precautions  Outcome: Progressing  Goal: Additional High Fall Risk Precautions (consider the following)  Outcome: Progressing     Problem: Inadequate Gas Exchange  Goal: Patient is adequately oxygenated and ventilation is improved  Description: Assess and monitor vital signs, oxygen saturation, respiratory status to include rate, depth, effort, and lung sounds, mental status, cyanosis, and labs (ABG's).  Monitor effects of medications that may sedate the patient.  Collaborate with respiratory therapy to administer medications and treatments.  Outcome: Progressing

## 2021-11-07 NOTE — Unmapped (Signed)
[facility]  DEPARTMENT OF NEUROSURGERY  INPATIENT PROGRESS NOTE  Matthew [name]  [id number]  [birth year]    HD: 10 POD: 8    Neurosurgery Attending: Motley, MD    Interval History:  - No acute events overnight  - Delirium improved     Meds:  Current Facility-Administered Medications   Medication Dose Frequency Provider Last Admin    acetaminophen  650 mg Q6H Vickie Epley, MD 650 mg at 11/07/21 0214    amLODIPine  5 mg Daily 0900 Leontine Locket, MD 5 mg at 11/06/21 0957    AMOXicillin-clavulanate  1 tablet 2 times per day Vickie Epley, MD 1 tablet at 11/06/21 2054    atorvastatin  40 mg Nightly (2100) Leontine Locket, MD 40 mg at 11/06/21 2054    bisacodyL  10 mg Daily PRN Leontine Locket, MD 10 mg at 11/03/21 1815    brimonidine  1 drop BID Leontine Locket, MD 1 drop at 11/06/21 2111    cetirizine  10 mg Daily 0900 Leontine Locket, MD 10 mg at 11/06/21 0957    doxepin  10 mg Nightly (2100) Leontine Locket, MD 10 mg at 11/06/21 2055    finasteride  5 mg Daily 0900 Leontine Locket, MD 5 mg at 11/05/21 5409    gabapentin  600 mg Nightly (2100) Clarise Cruz, MD 600 mg at 11/06/21 2054    heparin  5,000 Units 3 times per day Vickie Epley, MD 5,000 Units at 11/06/21 2054    latanoprost  1 drop Nightly (2100) Leontine Locket, MD 1 drop at 11/06/21 2111    melatonin  3 mg Nightly (2100) Robin Searing, MD 3 mg at 11/06/21 2054    methocarbamoL  500 mg 4x Daily PRN Clarise Cruz, MD 500 mg at 11/06/21 2156    ondansetron  4 mg Q6H PRN Leontine Locket, MD 4 mg at 11/02/21 8119    oxyCODONE  2.5 mg Q4H PRN Clarise Cruz, MD 2.5 mg at 11/05/21 1800    Or    oxyCODONE  5 mg Q4H PRN Clarise Cruz, MD 5 mg at 11/06/21 0505    QUEtiapine  25 mg Nightly (2100) Clarise Cruz, MD 25 mg at 11/06/21 2055    senna-docusate  1 tablet BID Leontine Locket, MD 1 tablet at 11/06/21 2054    timolol  1 drop Q12H Leontine Locket, MD 1 drop at 11/06/21 2112       Examination:  Temp:  [97.6 F (36.4 C)-98.1 F (36.7 C)] 97.7 F  (36.5 C)  Heart Rate:  [72-79] 79  Resp:  [15] 15  BP: (143-163)/(72-94) 149/72    General:   Wound: c/d/I    Neurological  -GCS: 15  -Orientation: alert, oriented x 3 (person, place, and time)  -Language: speech fluent and appropriate  -Cranial Nerves: PERRL, EOMI, face symmetric, hearing intact, tongue midline   -Motor: FCCx4, full and symmetric strength and ROM    D EF EE HG IO   RUE:    5 5 5 5 5    LUE:     5 5 5 5 5     HF KE DF EHL PF   RLE: 5 5 5 5 5    LLE: 5 5 5 5 5   -Sensation: sensation intact to light touch and symmetric in bilateral upper and lower extremities  -Reflex: no hyperreflexia or clonus appreciated, Hoffman's absent BL, babinski downgoing BL    Labs:  Imaging Review  No new imaging to review    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 Patient underwent a C3-4 ACDF on 09/19 with intraoperative CSF leak. Patient admitted now with concern for continued CSF leak; now s/p exploration and repair of CSF leak, lumbar drain placement    PLAN:  - LD removed  - HOB >45 degress at all times   - Medicine on board, appreciate recs  - PM+R consulted   - Pain control  - Bowel reg  - NGT currently in place   - TF for now   - Hope to advance to PO diet    - No additional speech evaluations   - Delirium precautions  - DVT prophylaxis: SCDs/SQH   - Dispo: HHC    This note was copied forward from the note written by Dr. Lanae BoastGarner.  I have reviewed and updated the history, physical exam, data, assessment and plan of the note so it reflects the evaluation and management of the patient on today's date.     Please page [phone] with any questions or concerns.     Jarrett AblesJoel Ashly Goethe, M.D.  Neurosurgery Resident  3:43 AM

## 2021-11-08 ENCOUNTER — Encounter

## 2021-11-08 MED ORDER — QUEtiapine (SEROQUEL) tablet 25 mg
50 | Freq: Once | ORAL | Status: AC
Start: 2021-11-08 — End: 2021-11-08
  Administered 2021-11-08: 08:00:00 25 mg via ORAL

## 2021-11-08 MED FILL — HEPARIN (PORCINE) 5,000 UNIT/ML INJECTION SOLUTION: 5000 5,000 unit/mL | INTRAMUSCULAR | Qty: 1

## 2021-11-08 MED FILL — TYLENOL 325 MG TABLET: 325 325 mg | ORAL | Qty: 2

## 2021-11-08 MED FILL — GABAPENTIN 100 MG CAPSULE: 100 100 MG | ORAL | Qty: 2

## 2021-11-08 MED FILL — SEROQUEL 50 MG TABLET: 50 50 mg | ORAL | Qty: 1

## 2021-11-08 MED FILL — ATORVASTATIN 20 MG TABLET: 20 20 MG | ORAL | Qty: 2

## 2021-11-08 MED FILL — FINASTERIDE 5 MG TABLET: 5 5 mg | ORAL | Qty: 1

## 2021-11-08 MED FILL — CETIRIZINE 10 MG TABLET: 10 10 MG | ORAL | Qty: 1

## 2021-11-08 MED FILL — AMOXICILLIN 500 MG-POTASSIUM CLAVULANATE 125 MG TABLET: 500-125 500-125 mg | ORAL | Qty: 1

## 2021-11-08 MED FILL — TRAZODONE 25 MG DOSE: 50 50 MG | ORAL | Qty: 1

## 2021-11-08 MED FILL — DOXEPIN 10 MG CAPSULE: 10 10 MG | ORAL | Qty: 1

## 2021-11-08 MED FILL — STIMULANT LAXATIVE PLUS 8.6 MG-50 MG TABLET: 8.6-50 8.6-50 mg | ORAL | Qty: 1

## 2021-11-08 MED FILL — METHOCARBAMOL 500 MG TABLET: 500 500 MG | ORAL | Qty: 2

## 2021-11-08 MED FILL — AMLODIPINE 5 MG TABLET: 5 5 MG | ORAL | Qty: 1

## 2021-11-08 NOTE — Unmapped (Signed)
[facility]   Division of Physical Medicine and Rehabilitation   Initial Consult Note      Assessment:     76 y.o. 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      Plan:     DISH s/p C3-4 ACDF   CSF Leak s/p repair and lumbar drain placement  -PT/OT/SLP to maximize function.  Work on [employer], mobility, transfers, cognition, speech, swallow/dysphagia  and environmental modifications.    -At this time patient is most appropriate for inpatient rehabilitation    Delirium:  Hyperactive- multifactorial with recent surgeries, anesthesia, hospitalizations, pain, insomnia  -Maximize sleep wake cycle   -Recommend increasing melatonin 6-9mg  QHS for circadian rhythm normalization  -Recommend d/c trazodone 25mg  QHS to limit polypharmacy  -Recommend/agree with seroquel 25mg  QHS for sleep/delirium- use lowest effective dose to ensure adequate sleep without causing AM drowiness/poor arousal  -Encourage mobilization during the day.  Anticipate IPR will benefit patient with regular daily schedule and requirement of 3 hours daily therapy 5 days per week prior to returning home  -Limit sedating medications during the day    Post-Operative Pain:  Myofascial R Shoulder/Parascapular pain/tightness  -recommend addition of heat/ice PRN  -Recommend diclofenac gel to right shoulder/neck 2G TID-QID    Neuropathic Pain: Shooting pain w/restless leg  -No evidence of increased tone on exam.   No need for baclofen (d/c'd)  -Dural tension signs on exam- +Slump bilaterally.  Describing shooting pain into legs and voluntary leg twitching 2/2 pain.   -Recommend Lumbar imaging to assess for nerve root impingement as a trigger for restless leg, shooting  pain- assess for benefit of ESI  -Gabapentin 200/200/400mg  - higher doses with increased side effect.    -Consider NSAID/Steroids for pain/inflammation    Dysphagia: Anticipate in part due to anterior fusion with large CSF collection in anterior neck (much improved per wife) though wonder if has some long term swallowing deficits due to silent aspiration on MBS   -SLP re-evaluation pending.  Based on clinical exam (clear/strong voice, managing secretions) hopeful will have a much improved swallow and not require PEG consideration  -Recommend repeat evaluation (MBS vs FEES) per SLP recommendations    Janalyn Rouse, MD  Attending Physician  Physical Medicine and Rehabilitation    HPI:     Matthew [name] 76 y.o. 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  Post operative course complicated by delirium, dysphagia (NPO - silent aspiration), pain and insomnia.      Met with patient and wife in room.  Noting concerns for continued left shoulder pain and discomfort limiting his ability to sleep.  Shooting pain and restlessness in his legs.  Had poorly tolerated higher doses of gabapentin and team recently reducing dose with limited benefit for symptoms at the lower dose.  Reviewed prior workup and discussions with Dr. Waunita Schooner regarding his cervical dystonia, balance deficits and potential for CANVAS preceding his MRI C spine showing stenosis and DISH.  Wife wondering if LE symptoms may be due to  lumbar spine issues with his evidence of DISH.      Continues with NG at this time.  Denies prior concerns for dysphagia.  Wife noting prior to intervention had a large fluid collection of his anterior neck from the CSF leak which has now  improved.  Interested in reassessment of his swallow.      Noting symptoms of visual disturbances where the room will appear to have shifted (describing the TV looking as if has shifted to being down on the ground.  It will come on suddenly and make him uncomfortable and anxious.  Denies movement or position changes as a trigger of this.      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    Environmental allergies     Hearing loss     left (since age 89), right more recent    High cholesterol     Hypertension     Neck symptom     Trouble turning head to the left     Sleep apnea     Sleep disorder       Past Surgical History:   Procedure Laterality Date    ACHILLES TENDON REPAIR  01/31/2002    left     APPENDECTOMY      HERNIA REPAIR  02/01/1951    INCISION AND DRAINAGE POSTERIOR LUMBAR SPINE N/A 10/30/2021    Procedure: possible lumbar drain;  Surgeon: Tempie HoistBenjamin Motley, MD;  Location: [facility] OR;  Service: Neurosurgery;  Laterality: N/A;    IRRIGATION AND DEBRIDEMENT SPINE N/A 10/30/2021    Procedure: CSF leak repair, exploration of the neck;  Surgeon: Tempie HoistBenjamin Motley, MD;  Location: [facility] OR;  Service: Neurosurgery;  Laterality: N/A;    KNEE SURGERY Left 1976    SPINE SURGERY      TONSILLECTOMY      WISDOM TOOTH EXTRACTION        Family History   Problem Relation Age of Onset    Colon Cancer Mother     Heart failure Father         Scheduled Medications:  acetaminophen, 650 mg, Q6H  amLODIPine, 5 mg, Daily 0900  AMOXicillin-clavulanate, 1 tablet, 2 times per day  atorvastatin, 40 mg, Nightly (2100)  brimonidine, 1 drop, BID  cetirizine, 10 mg, Daily 0900  doxepin, 10 mg, Nightly (2100)  finasteride, 5 mg, Daily 0900  gabapentin, 200 mg, TID  gabapentin, 200 mg, Nightly (2100)  heparin, 5,000 Units, 3 times per day  latanoprost, 1 drop, Nightly (2100)  melatonin, 3 mg, Nightly (2100)  QUEtiapine, 25 mg, Nightly (2100)  senna-docusate, 1 tablet, BID  timolol, 1  drop, Q12H  traZODone, 25 mg, Nightly (2100)         PRN Medications:  bisacodyL, 10 mg, Daily PRN  methocarbamoL, 750 mg, 4x Daily PRN  ondansetron, 4 mg, Q6H PRN  oxyCODONE, 2.5 mg, Q4H PRN   Or  oxyCODONE, 5 mg, Q4H PRN        Allergies:  Allergies   Allergen Reactions    Animal Dander      Causes congestion    Adhesive Tape-Silicones Rash        Social and Functional History:   SOCIAL HISTORY:  Social History     Tobacco Use    Smoking status: Never    Smokeless tobacco: Never   Substance Use Topics  Alcohol use: Yes     Alcohol/week: 4.0 standard drinks     Types: 2 Glasses of wine, 2 Cans of beer per week        Living Situation:  Lives with his wife.  They had been considering moving to [place]- expressing concern about finding physicians for all of his concerns.     Prior Function:  Independent with mobility/ADLs      Objective:     Vitals:    11/08/21 0700   BP: (!) 165/94   Pulse: 90   Resp: 18   Temp: 97.7 F (36.5 C)   SpO2: 94%       General: WNWD. NAD, sitting in BSC.   HEENT:  NG in place (bridle) MMM. No scleral icterus  Lungs: Normal work of breathing.   Extremities: Warm and well perfused, no edema in extremities  Abdomen: Soft, NTND  Skin: No rashes or lesions in exposed areas.   MSK: left upper trap/neck and rhomboids firm and TTP.   SLUMP: Positive bilaterally  Strength:  5/5 BLE - KE/HF/DF/PF    Neurologic:    Mental status-A+O, follows 100% commands, language intact   Sensation- LT intact BLE   Coordination-finger to nose testing WNL, rapid alternating movements WNL   Reflexes-    R L    Quadriceps   0+  0+    Achilles   0+  0+  Psych:  Cooperative, normal affect    Data Review  CBC:   Lab Results   Component Value Date    WBC 7.6 11/07/2021    RBC 3.89 (L) 11/07/2021    HGB 12.7 (L) 11/07/2021    HCT 36.5 (L) 11/07/2021    PLT 244 11/07/2021     BMP:   Lab Results   Component Value Date    NA 142 11/07/2021    K 4.3 11/07/2021    CO2 26 11/07/2021    BUN 21 11/07/2021    CREATININE 0.67  11/07/2021    CALCIUM 9.7 11/07/2021     Coagulation:   Lab Results   Component Value Date    INR 1.0 10/14/2021    APTT 34.7 10/14/2021       Imaging:   MRI Brain: 07/12/21  1.  No acute abnormality.   2.  Mild chronic small vessel ischemic disease.   3.  No significant atrophy of the brainstem or cerebellum.   4.  Suspected retrolisthesis and disc bulge at C3-4 on localizer images     MRI C Spine 08/18/21  1.  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 Severe left C4 foraminal stenosis.   2.  Degenerative changes other levels without additional cord compression. Mild to moderate foraminal narrowing as described.     Therapy Assessments:    Speech Therapy 11/08/21  oropharyngeal dysphagia secondary to reduced hyolaryngeal elevation with absent anterior excursion, absent epiglottic inversion, reduced base of tongue retraction, poor glottic closure, reduced pharyngeal stripping wave, and reduced duration and distention of UES opening. Pt's swallow function was complicated by recent ACDF surgery (C3-4) w/ CSF leak, weak cough, and reduced laryngeal sensation. This resulted in silent aspiration of thin liquids, nectar-thick liquids, honey-thick liquids, and puree evidenced by MBS study 10/6. This date, patient's voicing is slightly improved and swelling of R neck appears significantly improved. Focused session on dysphagia exercises and discussion with patient and wife re: dysphagia POC. Recommend NPO with TF, ice  chips or teaspoon sips of water permitted pending repeat MBS study to re-evaluate pharyngeal swallow function and determine if appropriate to initiate PO diet.     Occupational Therapy 11/08/21  ADL  Lower Body Dressing: Maximum assistance  Lower Body Dressing Deficit: Set-up;Steadying;Verbal cueing;Supervision/safety;Increased time to complete;Don/doff R sock;Don/doff L sock  Location Assessed LE Dressing: Seated edge of  bed  Lower Body Dressing Deficit Additional Comments: With cues to attempt in a figure four position pt completed sock donning with minimal assistance for balance and moderate assistance to thread onto R foot and maximal assistance to thread sock onto his L foot. Impaired hand coordination noted throughout  Toileting Deficit Additional Comments: pt declined need tocomplete  Additional Comments: Spouse present and discussed delirium with OT and ways to decrease delirium during pts hospital stay; pt and spouse verbalized understanding. IPR expectations discussed. Rehab psych services discussed    Physical Therapy 11/08/21  Mobility  Bed Mobility  Sit to Supine: Stand-by assistance;increased time to complete task;towards the right (verbal cues for LLE management into bed)  Transfers  Sit to Stand: Minimal assistance;Moderate assistance;cues for hand placement;increased time to complete task;up to assistive device (Mod A from chair and 1 rep from EOB; Min A x 1 rep from EOB)  Sit to Stand Assistive Device: Rolling walker  Stand to Sit: Maximum assistance;Minimal assistance (LOB while attempting to manage RW with turn to bed on first attempt w/ B knees buckling and assist to guide hips to EOB/control descent; progressed to Min A x 2 attempts from RW)  Lateral Transfer: Moderate assistance;increased time to complete task;stand step;with assistive device;towards the left (thrapist facilitating lateral weight shift)  Lateral Transfers Assistive Device: Rolling walker  Gait  Distance: 30 ft  Level of Assistance: Moderate assistance  Assistive Device: Rolling walker  Gait Characteristics: Narrow BOS;Ataxic;Unsteady;Loss of balance;Scissoring (scissoring improved w/ therapis foot b/w patients feet and verbal cues to walk with widened BOS; B knee buckling intermittently throughout distance; increased trunk sway/lateral lean; therapist facilitating lateral weight shift and upright posture)  Balance  Sitting - Static: Contact  Guard assistance;Minimal assistance (intermittent right posterolateral lean)  Sitting - Dynamic: Minimal Assistance  Standing - Static: Minimal Assistance;Moderate Assistance;With Assistive Device   Standing-Static Assistive Device: Rolling walker  Standing - Dynamic: Moderate Assistance;With Assistive Device  Standing-Dynamic Assistive Device: Rolling walker

## 2021-11-08 NOTE — Unmapped (Signed)
Patient's wife Matthew [name] is requesting to speak with Dr. Timoteo Ace    Please advise   [phone]

## 2021-11-08 NOTE — Unmapped (Signed)
Physical Therapy  Treatment    Name: Matthew [name]  DOB: 25-Jul-1945  Attending Physician: Tempie Hoist, MD  Admission Diagnosis: CSF leak [G96.00]  Postoperative CSF leak [G97.82, G96.00]  Date: 11/08/2021  Room: 5150/U5150  Reviewed Pertinent hospital course: Yes    Hospital Course PT/OT: 76 y.o. male presents with neck pain and swelling. 9/30 OR: CSF leak repair, neck exploration, lumbar drain placement. 10/5 lumbar drain removed. 10/8 L shoulder XR: Moderate AC joint osteoarthritis.  Relevant PMH : C3-C4 ACDF with introperative CSF leak  Precautions: none  Activity Level: Activity as tolerated    Assist: None    Assessment  Pt tolerated treatment session fairly well this date and is making slow stead gains toward acute care goals as standing balance improved compared to previous session allowing for progression back to gait training with RW. Pt, however, continue to present with impairments in coordination and motor planning leading to poor gait quality and increased risk of falls. Pt will continue to benefit from skilled PT services to maximize return to PLOF and remains appropriate for IPR level of care at d/c when medically appropriate.    Patient will benefit from the intensity of an acute rehab stay with multiple skilled therapy disciplines. The patient will benefit significantly from and is able to tolerate extensive daily therapy (at least 3 hours per day). At this time the patient is below their baseline with functional mobility, requiring increased need for assistance and increased burden of care.      Prognosis: Good    Recommendation  Recommendation: IP Rehab  Equipment Recommended: Rolling walker, Patient already has needed DME    Justification for DME ordered: [name]: Patient has decreased weight bearing or impaired balance putting them at risk for falling without use of a walker. They are unable to utilize crutches or a cane to provide adequate support    Outcome Measures  AM-PAC 6  Clicks Basic Mobility Inpatient Short Form: PT 6 Clicks Score: 14          Mobility Recommendations for Staff  Patient ability: Patient transfers to chair/ bedside commode  Assist needed: with 1 person assist  Equipment/ Precautions needed: Requires assistive device  Requires Assistive Device: Rolling walker    Cognition  Overall Cognitive Status: Impaired  Arousal/Alertness: Alert  Orientation Level: Oriented X4  Behavior: Cooperative;Motivated;Confused;Distracted  Following Commands: Follows one step commands;Requires repetition of instruction;Requires verbal cues  Safety Judgment: Impaired judgment;Decreased safety awareness  Insight: Demonstrated decreased insight into limitations and abilities to complete ADLs safely  Comments: anxiety with mobilization     Pain  Pain Score: 4   Pain Location: Neck (and shoulder)  Pain Descriptors: Aching  Pain Intervention(s): Repositioned;Ambulation/increased activity  Therapist reported pain to: RN     Mobility  Bed Mobility  Sit to Supine: Stand-by assistance;increased time to complete task;towards the right (verbal cues for LLE management into bed)  Transfers  Sit to Stand: Minimal assistance;Moderate assistance;cues for hand placement;increased time to complete task;up to assistive device (Mod A from chair and 1 rep from EOB; Min A x 1 rep from EOB)  Sit to Stand Assistive Device: Rolling walker  Stand to Sit: Maximum assistance;Minimal assistance (LOB while attempting to manage RW with turn to bed on first attempt w/ B knees buckling and assist to guide hips to EOB/control descent; progressed to Min A x 2 attempts from RW)  Lateral Transfer: Moderate assistance;increased time to complete task;stand step;with assistive device;towards the left (thrapist facilitating lateral weight  shift)  Lateral Transfers Assistive Device: Rolling walker  Gait  Distance: 30 ft  Level of Assistance: Moderate assistance  Assistive Device: Rolling walker  Gait Characteristics: Narrow  BOS;Ataxic;Unsteady;Loss of balance;Scissoring (scissoring improved w/ therapis foot b/w patients feet and verbal cues to walk with widened BOS; B knee buckling intermittently throughout distance; increased trunk sway/lateral lean; therapist facilitating lateral weight shift and upright posture)  Balance  Sitting - Static: Contact Guard assistance;Minimal assistance (intermittent right posterolateral lean)  Sitting - Dynamic: Minimal Assistance  Standing - Static: Minimal Assistance;Moderate Assistance;With Assistive Device   Standing-Static Assistive Device: Rolling walker  Standing - Dynamic: Moderate Assistance;With Assistive Device  Standing-Dynamic Assistive Device: Rolling walker    Gait belt used: Yes    Exercise       Standing  Exercises: Heel raises;5 reps  Reps/Comments: foot taps x 5 reps BLEs; therapist providing tactile cues for upright posture and lateral weight shift w/ foot taps and verbal cues for increased muscle power/technique with heel raises; verbal cues for control throughout both exercises         Position after Treatment and Safety Handoff  Position after treatment and safety handoff  Position after therapy session: Bed  Details: RN notified;Call light/ needs within reach;visitor present;UE elevated on pillow for edema control  Alarms: Bed  Alarms Status: Activated and Interfaced with call system    Goals  Goals Met: None    Collaborated with: Patient  Patient Stated Goal: to go home  Goals to be met by: 11/15/21  Patient will transition from supine to sit: Independent  Patient will transition from sit to supine: Independent  Patient will transfer from sit to stand: Contact Guard assistance  Patient will ambulate: Supervision (150' with LRAD)  Long-term goal to be met by: 11/22/21  Long Term Goal : Pt will tolerate formal balance assessment    Patient/Family Education  Educated patient and patient's family on goals, plan of care, importance of increased activity, discharge recommendations,  transfer training, gait training, and safety and need for supervision during OOB activity and fall prevention strategies, including need for supervision/ assistance with OOB activity and use of call light; patient and patient's family verbalized understanding, needed cues, and will need reinforcement. Handout(s) issued: N/A.    Plan  Plan  Treatment/Interventions: LE strengthening/ROM, Museum/gallery curator, Therapeutic Activity, Gait training, Therapeutic Exercise, Patient/family training, Neuromuscular Reeducation  PT Frequency: minimum 3x/week    The plan of care and recommendations assesses the patient's and/or caregiver's readiness, willingness, and ability to provide or support functional mobility and ADL tasks as needed upon discharge.      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    Environmental allergies     Hearing loss  left (since age 50), right more recent    High cholesterol     Hypertension     Neck symptom     Trouble turning head to the left     Sleep apnea     Sleep disorder         Past Surgical History  Past Surgical History:   Procedure Laterality Date    ACHILLES TENDON REPAIR  01/31/2002    left     APPENDECTOMY      HERNIA REPAIR  02/01/1951    INCISION AND DRAINAGE POSTERIOR LUMBAR SPINE N/A 10/30/2021    Procedure: possible lumbar drain;  Surgeon: Tempie Hoist, MD;  Location: [facility] OR;  Service: Neurosurgery;  Laterality: N/A;    IRRIGATION AND DEBRIDEMENT SPINE N/A 10/30/2021    Procedure: CSF leak  repair, exploration of the neck;  Surgeon: Tempie Hoist, MD;  Location: [facility] OR;  Service: Neurosurgery;  Laterality: N/A;    KNEE SURGERY Left 1976    SPINE SURGERY      TONSILLECTOMY      WISDOM TOOTH EXTRACTION

## 2021-11-08 NOTE — Unmapped (Signed)
Patient wife relayed message to OT that patient needed pain medication. Upon pain assessment with patient, Patient feeling good, pain is a 1/10. No pain medication administered. Wife returned to bedside and said patient pain is higher than that. Will re-assess as appropriate.

## 2021-11-08 NOTE — Unmapped (Signed)
Occupational Therapy  Treatment     Name: Matthew [name]  DOB: [birth year]  Attending Physician: Tempie Hoist, MD  Admission Diagnosis: CSF leak [G96.00]  Postoperative CSF leak [G97.82, G96.00]  Date: 11/08/2021  Room: 5150/U5150  Reviewed Pertinent hospital course: Yes   Hospital Course PT/OT: 76 y.o. male presents with neck pain and swelling. 9/30 OR: CSF leak repair, neck exploration, lumbar drain placement. 10/5 lumbar drain removed. 10/8 L shoulder XR: Moderate AC joint osteoarthritis.  Relevant PMH : C3-C4 ACDF with introperative CSF leak  Precautions: none  Activity Level: Activity as tolerated    Assist: None    Recommendation  Recommendation: IP Rehab  Equipment Recommendations: Patient already has needed DME      Assessment  Assessment: Decreased ADL status, Decreased UE strength, Decreased Safe judgment during ADL, Decreased cognition, Decreased activity tolerance, Decreased Balance, Decreased Functional Mobility, Decreased IADLs, Decreased self-care transfers, Decreased fine motor control          Prognosis for OT goals: Good                        Patient tolerated tx session well and was motivated to work with therapy. Patient does require extra time and cues for all task completion due to anxiety about pain, falling and weakness in general. Patient required moderate assistance to take steps and transfer to a chair and maximal assistance for LE ADLs. Extra time and education provided during all activity.  Continued skilled OT services recommended to increase pts safety and independence with ADL and IADL task completion by addressing strength, sensation, coordination, activity tolerance, mobility, cognition and balance.      Outcome Measures  AM-PAC 6 Clicks Daily Activity Inpatient Short Form: OT 6 Clicks Score: 12     Cognition  Overall Cognitive Status: Impaired  Arousal/Alertness: Alert  Orientation Level: Oriented X4  Behavior: Cooperative;Confused;Tangential;Distracted  Following  Commands: Follows one step commands;Requires verbal cues;Requires increased time  Safety Judgment: Impaired judgment;Decreased safety awareness  Insight: Demonstrated decreased insight into limitations and abilities to complete ADLs safely  Comments: anxiety with mobilization    Pain  Pain Score: 10 - Worst pain ever  Pain Location: Neck (L shoulder)  Pain Descriptors: Aching  Pain Intervention(s): Ambulation/increased activity;Repositioned  Therapist reported pain to: RN    Exercises  Exercises  Balance Exercises: Standing weight shifting;Sitting weight shifting  Balance Exercises Comment: Seated with minimal assistance - poor body awareness and pain decreasing pts independence with seated balance.  Moderate assistance in standing foir weight shifting with education and cues on progression of LEs to ambulate. limited by anxiety about falling and fear of being too weak.  Exercises Comment: Patient with slight tremors noted    Functional Mobility  Bed Mobility  Supine to Sit: Minimal assistance;increased time to complete task  Functional Transfers  Sit to Stand: Moderate assistance;increased time to complete task;cues for hand placement  Stand to Sit: Moderate assistance;increased time to complete task;cues for hand placement  Bed to Chair: Moderate assistance;stand step;increased time to complete task  Functional Mobility: Moderate assistance;increased time to complete task (B HHA)  Functional Mobility Comment: took side steps x5 and stepped forward x3 with moderate assistance. Cues provided throughout to coordinate movements with extra time allowed for motor planning  Balance  Sitting - Static: Contact Guard assistance  Sitting - Dynamic: Minimal Assistance  Standing - Static: Minimal Assistance  Standing - Dynamic: Moderate Assistance    Gait belt used: Yes  ADL  Lower Body Dressing: Maximum assistance  Lower Body Dressing Deficit: Set-up;Steadying;Verbal cueing;Supervision/safety;Increased time to  complete;Don/doff R sock;Don/doff L sock  Location Assessed LE Dressing: Seated edge of bed  Lower Body Dressing Deficit Additional Comments: With cues to attempt in a figure four position pt completed sock donning with minimal assistance for balance and moderate assistance to thread onto R foot and maximal assistance to thread sock onto his L foot. Impaired hand coordination noted throughout  Toileting Deficit Additional Comments: pt declined need tocomplete  Additional Comments: Spouse present and discussed delirium with OT and ways to decrease delirium during pts hospital stay; pt and spouse verbalized understanding. IPR expectations discussed. Rehab psych services discussed       Position after Treatment/Safety Handoff  Position after therapy session: Bed  Details: RN notified;Call light/ needs within reach  Alarms: Chair  Alarms Status: Activated and Interfaced with call system    Goals   Goals to be met in: 1 week (all goals modified 10/5 to reflect change in status)  Patient stated goal: to go home  Patient will complete supine to sit in prep for ADLs: Supervision  Patient will complete toilet transfer: Contact Guard assistance  Patient will complete toileting: Minimal assistance  Patient will complete grooming task: Minimal assistance (in stance)  Patient will complete lower body dressing: Minimal assistance (don pants)  Long Term Goal : Pt will tolerate bathing assessment  Long term goal to be met in: 2 weeks  Collaborated with: Patient    Plan  Plan  Treatment Interventions: ADL retraining, Patient/Family training, Activity Tolerance training, Compensatory technique education, Energy Conservation, Functional transfer training, Therapeutic Activity, IADL retraining, Cognitive reorientation, UE strengthening/ROM, Excercise  Progress: Progressing toward goals  OT Frequency: minimum 3x/week  The plan of care and recommendations assesses the patient's and/or caregiver's readiness, willingness, and ability to  provide or support functional mobility and ADL tasks as needed upon discharge.    Patient/Family Education  Educated patient on the role of occupational therapy, OT goals, OT plan of care, discharge recommendation, ADL training, functional mobility training, and the importance of safety and fall prevention strategies including need for supervision/ assistance with OOB activity and use of call light. patient  verbalized understanding, demonstrated understanding, needed cues, and will need reinforcement.    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    Environmental allergies     Hearing loss     left (since age 86), right more recent    High cholesterol     Hypertension     Neck symptom     Trouble turning head to the left     Sleep apnea     Sleep disorder      Past Surgical History  Past Surgical History:   Procedure Laterality Date    ACHILLES  TENDON REPAIR  01/31/2002    left     APPENDECTOMY      HERNIA REPAIR  02/01/1951    INCISION AND DRAINAGE POSTERIOR LUMBAR SPINE N/A 10/30/2021    Procedure: possible lumbar drain;  Surgeon: Tempie Hoist, MD;  Location: [facility] OR;  Service: Neurosurgery;  Laterality: N/A;    IRRIGATION AND DEBRIDEMENT SPINE N/A 10/30/2021    Procedure: CSF leak repair, exploration of the neck;  Surgeon: Tempie Hoist, MD;  Location: [facility] OR;  Service: Neurosurgery;  Laterality: N/A;    KNEE SURGERY Left 1976    SPINE SURGERY      TONSILLECTOMY      WISDOM TOOTH EXTRACTION

## 2021-11-08 NOTE — Unmapped (Signed)
Internal Medicine Progress Note    Assessment & Recommendations       Zakai Gonyea is a 76 y.o. 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  He was admitted to the hospital 9/29 for continued CSF leak, and is now s/p exploration and repair of CSF leak, as well as lumbar drain placement. Medicine was consulted for management of delirium.     #Delirium, hospital-related   Patient experienced difficulty sleeping, issues with altered sensorium while on baclofen, and higher doses of gabapentin, oxycodone. After these med doses were reduced and patient was on seroquel 25mg  qhs, patient was able to sleep better overnight and feeling better AAOx4 this morning. Main concerns for patient and wife this morning around chronic issues including poor sleep issues and movement disorder. Follows with neurology outpatient. Now describing symptoms of vertigo/room spinning. From a delirium perspective, patient does not appear to have symptoms of delirium at this time.      - Delirium precautions  - Continue seroquel 25mg  qhs  - Continue dose-reductions of oxycodone to 2.5mg -5mg  prn, gabapentin 600mg  qhs  - Agree with discontinuation of baclofen.  - Agree with using robaxin 500mg  prn for muscle spasms  - Sleep care bundle   - Fall precautions  - Frequent orientation   - Avoid restraints  - Minimize tethering devices as clinically able  - Encourage mobilization as tolerated  - Would recommend involving neurology consult team if primary team with concerns for movement disorder and/or vertigo this admission. Communicated this recommendation with primary team directly.      Thank you for allowing [place] to participate in this pt's care. This case will be staffed with the attending physician Dr. Deatra James. We will sign off at this time. Feel free to reach out for any  additional concerns.     Gaetano Net, MD  PGY-3, Internal Medicine - Pediatrics   AOD [phone]    Interval History / Subjective     Still not sleeping well at night. Very restless and has a hard time sleeping but this is a chronic issues. Follows closely with neurology movement disorder clinic outpatient. No longer hallucinating and is able to clearly describe his thoughts this morning. Also endorsing sensation of room spinning/tilting which is new.     ROS: No chest pain, dyspnea, nausea/vomiting.    Physical Exam     Physical Exam  Constitutional:       Appearance: Normal appearance.      Comments: Sitting up in chair   HENT:      Nose:      Comments: NGT in place  Cardiovascular:      Rate and Rhythm: Normal rate and regular rhythm.      Pulses: Normal pulses.      Heart sounds: Normal heart sounds. No murmur heard.  Pulmonary:      Effort: Pulmonary effort is normal. No respiratory distress.      Breath sounds: Normal breath sounds. No wheezing, rhonchi or rales.   Abdominal:      General: Abdomen is flat. There is no distension.      Palpations: Abdomen is soft.      Tenderness: There is no abdominal tenderness.   Skin:     General: Skin is warm.      Capillary Refill: Capillary refill takes less than 2 seconds.  Neurological:      General: No focal deficit present.      Mental Status: He is alert and oriented to person, place, and time.       Diagnostic Data     All laboratory data and diagnostic testing was reviewed.

## 2021-11-08 NOTE — Unmapped (Signed)
[facility]  DEPARTMENT OF NEUROSURGERY  INPATIENT PROGRESS NOTE  Matthew [name]  [id number]  06/01/1945    HD: 11 POD: 9    Neurosurgery Attending: Motley, MD    Interval History:  - No acute events overnight  - continued difficulty sleeping during the night     Meds:  Current Facility-Administered Medications   Medication Dose Frequency Provider Last Admin    acetaminophen  650 mg Q6H Matthew Epley, MD 650 mg at 11/07/21 2028    amLODIPine  5 mg Daily 0900 Matthew Locket, MD 5 mg at 11/07/21 1044    AMOXicillin-clavulanate  1 tablet 2 times per day Matthew Epley, MD 1 tablet at 11/07/21 2029    atorvastatin  40 mg Nightly (2100) Matthew Locket, MD 40 mg at 11/07/21 2029    bisacodyL  10 mg Daily PRN Matthew Locket, MD 10 mg at 11/03/21 1815    brimonidine  1 drop BID Matthew Locket, MD 1 drop at 11/07/21 2030    cetirizine  10 mg Daily 0900 Matthew Locket, MD 10 mg at 11/07/21 1043    doxepin  10 mg Nightly (2100) Matthew Locket, MD 10 mg at 11/07/21 2030    finasteride  5 mg Daily 0900 Matthew Locket, MD 5 mg at 11/05/21 5409    gabapentin  200 mg TID Matthew Cruz, MD 200 mg at 11/07/21 2030    gabapentin  200 mg Nightly (2100) Matthew Cruz, MD 200 mg at 11/07/21 2030    heparin  5,000 Units 3 times per day Matthew Epley, MD 5,000 Units at 11/08/21 0420    latanoprost  1 drop Nightly (2100) Matthew Locket, MD 1 drop at 11/07/21 2031    melatonin  3 mg Nightly (2100) Matthew Searing, MD 3 mg at 11/06/21 2054    methocarbamoL  750 mg 4x Daily PRN Matthew Cruz, MD 750 mg at 11/08/21 0419    ondansetron  4 mg Q6H PRN Matthew Locket, MD 4 mg at 11/02/21 8119    oxyCODONE  2.5 mg Q4H PRN Matthew Cruz, MD 2.5 mg at 11/05/21 1800    Or    oxyCODONE  5 mg Q4H PRN Matthew Cruz, MD 5 mg at 11/07/21 1044    QUEtiapine  25 mg Nightly (2100) Matthew Cruz, MD 25 mg at 11/07/21 2032    senna-docusate  1 tablet BID Matthew Locket, MD 1 tablet at 11/07/21 1044    timolol  1 drop Q12H Matthew Locket, MD 1  drop at 11/07/21 1055    traZODone  25 mg Nightly (2100) Matthew Cruz, MD 25 mg at 11/07/21 2032       Examination:  Temp:  [97.4 F (36.3 C)-98.6 F (37 C)] 98.6 F (37 C)  Heart Rate:  [74-77] 74  Resp:  [16-20] 20  BP: (139-157)/(77-87) 155/87    General:   Wound: c/d/I    Neurological  -GCS: 15  -Orientation: alert, oriented x 3 (person, place, and time)  -Language: speech fluent and appropriate  -Cranial Nerves: PERRL, EOMI, face symmetric, hearing intact, tongue midline   -Motor: FCCx4, full and symmetric strength and ROM    D EF EE HG IO   RUE:    5 5 5 5 5    LUE:     5 5 5 5 5     HF KE DF EHL PF   RLE: 5 5 5 5 5    LLE: 5 5  5 5 5   -Sensation: sensation intact to light touch and symmetric in bilateral upper and lower extremities  -Reflex: no hyperreflexia or clonus appreciated, Hoffman's absent BL, babinski downgoing BL    Labs:  Na/K/Cl/CO2:  142/4.3/105/26 (10/08 1610)  WBC/Hgb/Hct/Plts:  7.6/12.7/36.5/244 (10/08 9604)       Imaging Review  No new imaging to review    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 Patient underwent a C3-4 ACDF on 09/19 with intraoperative CSF leak. Patient admitted now with concern for continued CSF leak; now s/p exploration and repair of CSF leak, lumbar drain placement    PLAN:  - LD removed  - HOB >45 degress at all times   - Medicine on board, appreciate recs  - PM+R consulted   - Pain control  - Bowel reg  - NGT currently in place   - TF for now   - Hope to advance to PO diet    - SLP today  - Delirium precautions  - DVT prophylaxis: SCDs/SQH   - Dispo: HHC    This note was copied forward from the note written by Dr. Purnell Shoemaker.  I have reviewed and updated the history, physical exam, data, assessment and plan of the note so it reflects the evaluation and management of the patient on today's date.     Please page [phone] with  any questions or concerns.     Matthew [name] M. Matthew [name], M.D.  Neurosurgery Resident  7:25 AM

## 2021-11-08 NOTE — Unmapped (Signed)
AM med pass via NG tube. Assessment charted in flowsheets. BP elevated, norvasc given. Swallow study to be completed again today.

## 2021-11-08 NOTE — Unmapped (Signed)
Problem: Acute Pain  Description: Patient's pain progressing toward patient's stated pain goal  Goal: Patient will manage pain with the appropriate technique/intervention  Description: Assess and monitor patient's pain using appropriate pain scale. Collaborate with interdisciplinary team and initiate plan and interventions as ordered.  Re-assess patient's pain level 30-60 minutes after pain management intervention.  Outcome: Progressing  Goal: Patient will reduce or eliminate use of analgesics  Outcome: Progressing  Goal: Patients pain is managed to allow active participation in daily activities  Outcome: Progressing  Goal: Patient verbalizes a reduction in pain level  Outcome: Progressing     Problem: Safety  Goal: Patient will be injury free during hospitalization  Description: Assess and monitor vitals signs, neurological status including level of consciousness and orientation. Assess patient's risk for falls and implement fall prevention plan of care and interventions per hospital policy.      Ensure arm band on, uncluttered walking paths in room, adequate room lighting, call light and overbed table within reach, bed in low position, wheels locked, side rails up per policy, and non-skid footwear provided.   Outcome: Progressing  Goal: Patient with weight > 350lbs will have appropriate equipment  Description: Consider ordering Bariatric Bed, Chair and Bedside Commode for patient weight > 350 lbs.  Outcome: Progressing     Problem: Patient will remain free of falls  Goal: Universal Fall Precautions  Outcome: Progressing     Problem: Daily Care  Goal: Daily care needs are met  Description: Assess and monitor ability to perform self care and identify potential discharge needs.  Outcome: Progressing     Problem: Psychosocial Needs  Goal: Demonstrates ability to cope with hospitalization/illness  Description: Assess and monitor patients ability to cope with his/her illness.  Outcome: Progressing  Goal: Collaborate  with patient/family to identify patient's goals  Outcome: Progressing     Problem: Discharge Barriers  Goal: Patient's discharge needs are met  Description: Collaborate with interdisciplinary team and initiate plans and interventions as needed.   Outcome: Progressing     Problem: Patient will remain free of injury d/t fall  Goal: High Fall Risk Precautions  Outcome: Progressing  Goal: Additional High Fall Risk Precautions (consider the following)  Outcome: Progressing     Problem: Inadequate Gas Exchange  Goal: Patient is adequately oxygenated and ventilation is improved  Description: Assess and monitor vital signs, oxygen saturation, respiratory status to include rate, depth, effort, and lung sounds, mental status, cyanosis, and labs (ABG's).  Monitor effects of medications that may sedate the patient.  Collaborate with respiratory therapy to administer medications and treatments.  Outcome: Progressing

## 2021-11-08 NOTE — Unmapped (Signed)
Tube feed paused for short time while SLP worked with patient. Re-started per orders. Plans for Barium swallow tomorrow.

## 2021-11-08 NOTE — Unmapped (Signed)
Speech Language Pathology  Dysphagia Progress Note      Name: Matthew [name]  DOB: Dec 27, 1945  Attending Physician: Tempie Hoist, MD  Admission Diagnosis: CSF leak [G96.00]  Postoperative CSF leak [G97.82, G96.00]  Date: 11/08/2021  Reviewed Pertinent hospital course: Yes  Hospital Course SLP: 76 y/o male s/p C3-4 ACDF on 9/19 complicated by intraoperative CSF leak presents with progressive R neck swelling concerning for continued CSF leak. 9/30: underwent CSF leak repair and neck exploration.  Imaging: No recent chest imaging.  SLP Hx: 9/20: seen for bedside swallow evaluation with recs for regular diet with thin liquids.    Assessment: Patient with oropharyngeal dysphagia secondary to reduced hyolaryngeal elevation with absent anterior excursion, absent epiglottic inversion, reduced base of tongue retraction, poor glottic closure, reduced pharyngeal stripping wave, and reduced duration and distention of UES opening. Pt's swallow function was complicated by recent ACDF surgery (C3-4) w/ CSF leak, weak cough, and reduced laryngeal sensation. This resulted in silent aspiration of thin liquids, nectar-thick liquids, honey-thick liquids, and puree evidenced by MBS study 10/6. This date, patient's voicing is slightly improved and swelling of R neck appears significantly improved. Focused session on dysphagia exercises and discussion with patient and wife re: dysphagia POC. Recommend NPO with TF, ice chips or teaspoon sips of water permitted pending repeat MBS study to re-evaluate pharyngeal swallow function and determine if appropriate to initiate PO diet.     Plan/Recommendation  ST while in house: recommended  ST while in house frequency: 1-3x/week  ST at discharge: recommended  Recommended Solid for Diet: no solid consistency  Recommended Liquid for Diet: no liquid consistency  Medication Administration: feeding tube  Ice chips one at a time and teaspoon sips of water permitted, excellent oral care  MBS study  tomorrow    Subjective: Patient alert, oriented to self (DNT further). No signs of pain. Wife present for session.     Objective:  Goals  Goals Short Term Dysphagia  Patient will demonstrate use of swallowing exercises: for laryngeal closure and pharyngeal constriction with 90% acc given min cues.  >>In progress. Completed ~50 effortful swallows, attempted 3x mendolsohn maneuver with max cues.   [name] will demonstrate understanding of clinical signs and symptoms of aspiration vs. silent aspiration at 80% accuracy: given min cues.  >>Goal not addressed.   [name] will demonstrate understanding of risks associated with aspiration at 100% accuracy: given min cues.  >>Goal met 10/9 with wife. Discussed pillars of aspiration pneumonia. She is able to demonstrate comprehension.   Time frame for goals to be met in: 10/13    Goals Long Term Dysphagia  Patient will tolerate the least restrictive diet as determined via instrumental assessment with 100% accuracy independently: with 100% acc independently.  >>Indirectly addressed. 10x ice chips and 6x puree trials used to aid with dysphagia exercises. Intermittent cough noted. Recommend repeat MBS study given history of silent and overt aspiration/dysphagia hx. Discussed FEES option with wife, however there is concern for not being able to obtain adequate pyriform sinus and  posterior subglottic views given known PPW edema.   Time frame for goals to be met in: 10/20    Patient and Family Education  Patient and Family Education: Patient educated on, instrumental procedure, role of SLP, current POC, dysphagia, swallowing anatomy & physiology, risks of aspiration, results of instrumental examination  Education response: Patient demonstrated understanding    End of Session:  Patient was left in bed with call light within reach and  all needs met.      Sherene Sires, M.A, CCC-SLP  Speech Language Pathologist--Rehab Services    [facility]  MBSImP Certified Clinician    Time  Start Time: [phone]  Stop Time: 1440  Time Calculation (min): 30 min    Charges      $Swallow Tx : 1 Procedure

## 2021-11-08 NOTE — Unmapped (Signed)
Problem: Acute Pain  Description: Patient's pain progressing toward patient's stated pain goal  Goal: Patient will manage pain with the appropriate technique/intervention  Description: Assess and monitor patient's pain using appropriate pain scale. Collaborate with interdisciplinary team and initiate plan and interventions as ordered.  Re-assess patient's pain level 30-60 minutes after pain management intervention.  11/08/2021 0250 by Laurence Ferrari, RN  Outcome: Progressing  11/08/2021 0224 by Laurence Ferrari, RN  Outcome: Progressing  Goal: Patient will reduce or eliminate use of analgesics  11/08/2021 0250 by Laurence Ferrari, RN  Outcome: Progressing  11/08/2021 0224 by Laurence Ferrari, RN  Outcome: Progressing  Goal: Patients pain is managed to allow active participation in daily activities  11/08/2021 0250 by Laurence Ferrari, RN  Outcome: Progressing  11/08/2021 0224 by Laurence Ferrari, RN  Outcome: Progressing  Goal: Patient verbalizes a reduction in pain level  11/08/2021 0250 by Laurence Ferrari, RN  Outcome: Progressing  11/08/2021 0224 by Laurence Ferrari, RN  Outcome: Progressing     Problem: Safety  Goal: Patient will be injury free during hospitalization  Description: Assess and monitor vitals signs, neurological status including level of consciousness and orientation. Assess patient's risk for falls and implement fall prevention plan of care and interventions per hospital policy.      Ensure arm band on, uncluttered walking paths in room, adequate room lighting, call light and overbed table within reach, bed in low position, wheels locked, side rails up per policy, and non-skid footwear provided.   11/08/2021 0250 by Laurence Ferrari, RN  Outcome: Progressing  11/08/2021 0224 by Laurence Ferrari, RN  Outcome: Progressing  Goal: Patient with weight > 350lbs will have appropriate equipment  Description: Consider ordering Bariatric Bed, Chair and Bedside Commode for patient  weight > 350 lbs.  11/08/2021 0250 by Laurence Ferrari, RN  Outcome: Progressing  11/08/2021 0224 by Laurence Ferrari, RN  Outcome: Progressing     Problem: Patient will remain free of falls  Goal: Universal Fall Precautions  11/08/2021 0250 by Laurence Ferrari, RN  Outcome: Progressing  11/08/2021 0224 by Laurence Ferrari, RN  Outcome: Progressing     Problem: Daily Care  Goal: Daily care needs are met  Description: Assess and monitor ability to perform self care and identify potential discharge needs.  11/08/2021 0250 by Laurence Ferrari, RN  Outcome: Progressing  11/08/2021 0224 by Laurence Ferrari, RN  Outcome: Progressing     Problem: Psychosocial Needs  Goal: Demonstrates ability to cope with hospitalization/illness  Description: Assess and monitor patients ability to cope with his/her illness.  11/08/2021 0250 by Laurence Ferrari, RN  Outcome: Progressing  11/08/2021 0224 by Laurence Ferrari, RN  Outcome: Progressing  Goal: Collaborate with patient/family to identify patient's goals  11/08/2021 0250 by Laurence Ferrari, RN  Outcome: Progressing  11/08/2021 0224 by Laurence Ferrari, RN  Outcome: Progressing     Problem: Discharge Barriers  Goal: Patient's discharge needs are met  Description: Collaborate with interdisciplinary team and initiate plans and interventions as needed.   11/08/2021 0250 by Laurence Ferrari, RN  Outcome: Progressing  11/08/2021 0224 by Laurence Ferrari, RN  Outcome: Progressing     Problem: Patient will remain free of injury d/t fall  Goal: High Fall Risk Precautions  11/08/2021 0250 by Laurence Ferrari, RN  Outcome: Progressing  11/08/2021 0224 by Laurence Ferrari, RN  Outcome: Progressing  Goal: Additional High Fall  Risk Precautions (consider the following)  11/08/2021 0250 by Laurence Ferrari, RN  Outcome: Progressing  11/08/2021 0224 by Laurence Ferrari, RN  Outcome: Progressing     Problem: Inadequate Gas Exchange  Goal: Patient is adequately  oxygenated and ventilation is improved  Description: Assess and monitor vital signs, oxygen saturation, respiratory status to include rate, depth, effort, and lung sounds, mental status, cyanosis, and labs (ABG's).  Monitor effects of medications that may sedate the patient.  Collaborate with respiratory therapy to administer medications and treatments.  11/08/2021 0250 by Laurence Ferrari, RN  Outcome: Progressing  11/08/2021 0224 by Laurence Ferrari, RN  Outcome: Progressing

## 2021-11-08 NOTE — Unmapped (Signed)
Patient was seen on consult service today by Dr Timoteo Ace, no notes in the chart yet will route message to Dr Timoteo Ace

## 2021-11-08 NOTE — Unmapped (Addendum)
[facility]  Case Management/Social Work Department  Progress Note    Patient Information     Patient Name: Matthew [name]  MRN: [id number]  Hospital day: 10  Inpatient/Observation:  Inpatient   Level of Care:  floor  Admit date:  10/29/2021  Admission diagnosis: CSF leak [G96.00]  Postoperative CSF leak [G97.82, G96.00]    PMH:  has a past medical history of Cancer (CMS-HCC), Environmental allergies, Hearing loss, High cholesterol, Hypertension, Neck symptom, Sleep apnea, and Sleep disorder.    PCP:  Demetrius Charity, MD    Home Pharmacy:    [facility] [address] [place] - [address]  [address] [facility]  [place] [address]  Phone: [phone]     [facility]  [address]  [place] [address]  Phone: [phone]         Medical Insurance Coverage:  Payor: MEDICARE / Plan: MEDICARE A AND B / Product Type: Medicare /     Other Pertinent Information     RN Care Coordinator participated in interdisciplinary rounds with the MD team and completed chart review.  Patient is not medically ready today.  Failed MBS on Friday, SLP to see again today.  Both  [facility] and [facility] have declined to accept due to no feeding plan.  PM&R consulted and saw this morning.      OT saw patient this morning and still recommending [facility]; PT to follow up today pending scheduling.     1530:  SLP reevaluated, still rec NPO except for ice chips, plan to repeat MBS tomorrow.  CM spoke with wife at bedside.  She is agreeable to referral to [facility] .  She reports she had a long conversation with Dr. Timoteo Ace with PM&R, and is hopeful his swallow will recover with time.  Referral to [facility], Misty Stanley [phone])  to follow.   Wife reports that she wants to get him to [facility] as soon as possible to enhance his recovery.  Wife states she cannot take care of him at home in  this weakened state.     Discharge Plan     Anticipated discharge plan:  [facility]     Anticipated discharge  date: tomorrow    does not need a precert.   CM/SW will continue to follow and remain available for discharge planning needs.      Elliot Dally RN CCM  [phone]

## 2021-11-08 NOTE — Unmapped (Signed)
Problem: Acute Pain  Description: Patient's pain progressing toward patient's stated pain goal  Goal: Patient will manage pain with the appropriate technique/intervention  Description: Assess and monitor patient's pain using appropriate pain scale. Collaborate with interdisciplinary team and initiate plan and interventions as ordered.  Re-assess patient's pain level 30-60 minutes after pain management intervention.  Outcome: Progressing  Goal: Patient will reduce or eliminate use of analgesics  Outcome: Progressing  Goal: Patients pain is managed to allow active participation in daily activities  Outcome: Progressing  Goal: Patient verbalizes a reduction in pain level  Outcome: Progressing     Problem: Safety  Goal: Patient will be injury free during hospitalization  Description: Assess and monitor vitals signs, neurological status including level of consciousness and orientation. Assess patient's risk for falls and implement fall prevention plan of care and interventions per hospital policy.      Ensure arm band on, uncluttered walking paths in room, adequate room lighting, call light and overbed table within reach, bed in low position, wheels locked, side rails up per policy, and non-skid footwear provided.   Outcome: Progressing  Goal: Patient with weight > 350lbs will have appropriate equipment  Description: Consider ordering Bariatric Bed, Chair and Bedside Commode for patient weight > 350 lbs.  Outcome: Progressing     Problem: Patient will remain free of falls  Goal: Universal Fall Precautions  Outcome: Progressing     Problem: Daily Care  Goal: Daily care needs are met  Description: Assess and monitor ability to perform self care and identify potential discharge needs.  Outcome: Progressing     Problem: Psychosocial Needs  Goal: Demonstrates ability to cope with hospitalization/illness  Description: Assess and monitor patients ability to cope with his/her illness.  Outcome: Progressing  Goal: Collaborate  with patient/family to identify patient's goals  Outcome: Progressing     Problem: Discharge Barriers  Goal: Patient's discharge needs are met  Description: Collaborate with interdisciplinary team and initiate plans and interventions as needed.   Outcome: Progressing     Problem: Patient will remain free of injury d/t fall  Goal: High Fall Risk Precautions  Outcome: Progressing  Goal: Additional High Fall Risk Precautions (consider the following)  Outcome: Progressing     Problem: Inadequate Gas Exchange  Goal: Patient is adequately oxygenated and ventilation is improved  Description: Assess and monitor vital signs, oxygen saturation, respiratory status to include rate, depth, effort, and lung sounds, mental status, cyanosis, and labs (ABG's).  Monitor effects of medications that may sedate the patient.  Collaborate with respiratory therapy to administer medications and treatments.  Outcome: Progressing

## 2021-11-09 ENCOUNTER — Inpatient Hospital Stay
Admission: RE | Admit: 2021-11-09 | Discharge: 2021-11-19 | Disposition: A | Discharge status: Home / Self Care | Payer: PRIVATE HEALTH INSURANCE

## 2021-11-09 ENCOUNTER — Inpatient Hospital Stay: Admit: 2021-11-09 | Payer: MEDICARE

## 2021-11-09 DIAGNOSIS — S14109D Unspecified injury at unspecified level of cervical spinal cord, subsequent encounter: Secondary | ICD-10-CM

## 2021-11-09 LAB — CBC
Hematocrit: 38.2 % (ref 38.5–50.0)
Hemoglobin: 13.1 g/dL (ref 13.2–17.1)
MCH: 32.5 pg (ref 27.0–33.0)
MCHC: 34.2 g/dL (ref 32.0–36.0)
MCV: 95 fL (ref 80.0–100.0)
MPV: 7.6 fL (ref 7.5–11.5)
Platelets: 220 10*3/uL (ref 140–400)
RBC: 4.02 10*6/uL (ref 4.20–5.80)
RDW: 13.5 % (ref 11.0–15.0)
WBC: 9.1 10*3/uL (ref 3.8–10.8)

## 2021-11-09 LAB — BASIC METABOLIC PANEL
Anion Gap: 11 mmol/L (ref 3–16)
BUN: 27 mg/dL (ref 7–25)
CO2: 24 mmol/L (ref 21–33)
Calcium: 9.2 mg/dL (ref 8.6–10.3)
Chloride: 104 mmol/L (ref 98–110)
Creatinine: 0.82 mg/dL (ref 0.60–1.30)
EGFR: >90
Glucose: 120 mg/dL (ref 70–100)
Osmolality, Calculated: 294 mOsm/kg (ref 278–305)
Potassium: 4.4 mmol/L (ref 3.5–5.3)
Sodium: 139 mmol/L (ref 133–146)

## 2021-11-09 MED ORDER — gabapentin (NEURONTIN) 100 MG capsule
100 | ORAL_CAPSULE | Freq: Three times a day (TID) | ORAL | 0 refills | 30.00000 days | Status: AC
Start: 2021-11-09 — End: 2021-12-31

## 2021-11-09 MED ORDER — QUEtiapine (SEROQUEL) 25 MG tablet
25 | Freq: Every evening | ORAL | 0.00 refills | 30.00000 days | Status: AC
Start: 2021-11-09 — End: 2021-12-31

## 2021-11-09 MED ORDER — melatonin 3 mg Tab
3 | Freq: Every evening | ORAL | 0 refills | Status: AC
Start: 2021-11-09 — End: ?

## 2021-11-09 MED ORDER — acetaminophen (TYLENOL) 325 MG tablet
325 | ORAL_TABLET | Freq: Four times a day (QID) | ORAL | 0.00 refills | 11.00000 days | Status: AC
Start: 2021-11-09 — End: ?

## 2021-11-09 MED ORDER — melatonin tablet Tab 9 mg
3 | Freq: Every evening | ORAL
Start: 2021-11-09 — End: 2021-11-09

## 2021-11-09 MED ORDER — oxyCODONE (ROXICODONE) 5 MG immediate release tablet
5 | ORAL_TABLET | ORAL | 0 refills | 6.00000 days | Status: AC | PRN
Start: 2021-11-09 — End: 2021-11-16

## 2021-11-09 MED ORDER — methocarbamoL (ROBAXIN) 500 MG tablet
500 | ORAL_TABLET | Freq: Three times a day (TID) | ORAL | 0 refills | Status: AC | PRN
Start: 2021-11-09 — End: ?

## 2021-11-09 MED ORDER — barium sulfate (VARIBAR NECTAR) suspension 10 mL
40 | Freq: Once | ORAL | Status: AC | PRN
Start: 2021-11-09 — End: 2021-11-09
  Administered 2021-11-09: 14:00:00 10 mL via ORAL

## 2021-11-09 MED ORDER — VARI-BAR (barium) Pste 1 teaspoonful
40 | Freq: Once | ORAL | Status: AC | PRN
Start: 2021-11-09 — End: 2021-11-09
  Administered 2021-11-09: 15:00:00 1 [tsp_us] via ORAL

## 2021-11-09 MED ORDER — VARIBAR THIN (barium) Powd 1 teaspoonful
81 | Freq: Once | ORAL | Status: AC | PRN
Start: 2021-11-09 — End: 2021-11-09
  Administered 2021-11-09: 14:00:00 1 [tsp_us] via ORAL

## 2021-11-09 MED ORDER — VARIBAR HONEY (barium) suspension 1 teaspoonful
40 | Freq: Once | ORAL | Status: AC | PRN
Start: 2021-11-09 — End: 2021-11-09
  Administered 2021-11-09: 15:00:00 1 [tsp_us] via ORAL

## 2021-11-09 MED FILL — VARIBAR PUDDING 40 % (W/V), 30% (W/W) ORAL PASTE: 40 40 % (w/v), 30% (w/w) | ORAL | Qty: 230

## 2021-11-09 MED FILL — TRAZODONE 25 MG DOSE: 50 50 MG | ORAL | Qty: 1

## 2021-11-09 MED FILL — METHOCARBAMOL 500 MG TABLET: 500 500 MG | ORAL | Qty: 2

## 2021-11-09 MED FILL — STIMULANT LAXATIVE PLUS 8.6 MG-50 MG TABLET: 8.6-50 8.6-50 mg | ORAL | Qty: 1

## 2021-11-09 MED FILL — TYLENOL 325 MG TABLET: 325 325 mg | ORAL | Qty: 2

## 2021-11-09 MED FILL — ATORVASTATIN 20 MG TABLET: 20 20 MG | ORAL | Qty: 2

## 2021-11-09 MED FILL — MELATONIN 3 MG TABLET: 3 3 mg | ORAL | Qty: 1

## 2021-11-09 MED FILL — SEROQUEL 50 MG TABLET: 50 50 mg | ORAL | Qty: 1

## 2021-11-09 MED FILL — GABAPENTIN 100 MG CAPSULE: 100 100 MG | ORAL | Qty: 2

## 2021-11-09 MED FILL — HEPARIN (PORCINE) 5,000 UNIT/ML INJECTION SOLUTION: 5000 5,000 unit/mL | INTRAMUSCULAR | Qty: 1

## 2021-11-09 MED FILL — CETIRIZINE 10 MG TABLET: 10 10 MG | ORAL | Qty: 1

## 2021-11-09 MED FILL — FINASTERIDE 5 MG TABLET: 5 5 mg | ORAL | Qty: 1

## 2021-11-09 MED FILL — VARIBAR NECTAR 40 % (W/V), 30 % (W/W) ORAL SUSPENSION: 40 40 % (w/v) | ORAL | Qty: 240

## 2021-11-09 MED FILL — AMLODIPINE 5 MG TABLET: 5 5 MG | ORAL | Qty: 1

## 2021-11-09 MED FILL — VARIBAR THIN LIQUID 81 % (W/W) ORAL POWDER: 81 81 % (w/w) | ORAL | Qty: 5

## 2021-11-09 MED FILL — VARIBAR HONEY 40 % (W/V), 29% (W/W) ORAL SUSPENSION: 40 40 % (w/v) 29% (w/w) | ORAL | Qty: 250

## 2021-11-09 NOTE — Unmapped (Signed)
Speech Language Pathology  Dysphagia Progress Note      Name: Matthew [name]  DOB: Dec 20, 1945  Attending Physician: Tempie Hoist, MD  Admission Diagnosis: CSF leak [G96.00]  Postoperative CSF leak [G97.82, G96.00]  Date: 11/09/2021    Reviewed Pertinent hospital course: Yes  Hospital Course SLP: 76 y/o male s/p C3-4 ACDF on 9/19 complicated by intraoperative CSF leak presents with progressive R neck swelling concerning for continued CSF leak. 9/30: underwent CSF leak repair and neck exploration.  Imaging: No recent chest imaging.  SLP Hx: 9/20: seen for bedside swallow evaluation with recs for regular diet with thin liquids.    Assessment  Patient presents with improving oropharyngeal dysphagia characterized by poor bolus control, reduced hyolaryngeal elevation/excursion, reduced epiglottic inversion, reduced base of tongue retraction, poor glottic closure, reduced pharyngeal stripping wave, and reduced distention of UES opening. Minimal-moderate residue noted to pyriform sinuses across consistencies inconsistently cleared by spontaneous subsequent swallows. Silent aspiration of thin barium occurred 1x via tsp and 1x via straw; however the combination of thin barium via tsp and super supraglottic swallow maneuver w/ multiple secondary swallows was effective in protecting the airway. Overall, significant improvement in airway protection w/ liquids is observed and cough strength is improved. Based on pt's improvement, family support, and ability to adhere for compensatory strategies, recommend Dysphagia 1 diet w/ Thin liquids VIA TSP ONLY w/ super supraglottic swallow maneuver, meds crushed in puree or via TF. Patient would continue to benefit from further education and implementation of swallow strategies/exercise regimen. Handouts provided and education reviewed with patient and wife at bedside. All questions answered. SLP services still warranted at this time.    Plan/Recommendation  ST while in house:  recommended  ST while in house frequency: 1-3x/week  ST at discharge: recommended  Recommended Solid for Diet: Dysphagia 1 (puree)  Recommended Liquid for Diet: thin liquids (VIA TSP ONLY)  Medication Administration: crushed, puree or via feeding tube  Compensatory Swallowing Strategies/ Safe Swallowing Behaviors: Upright as possible for all oral intake, Small bites/sips, Oral care before meals, Super supraglottic swallow maneuver, Eat/feed slowly, Multiple swallows per bite/sip    Background/Subjective: Patient is alert, oriented to self (DNT further). Seen following MBS study at bedside with wife present.     Objective:  Goals  Goals Short Term Dysphagia  Patient will demonstrate use of swallowing exercises: for laryngeal closure and pharyngeal constriction with 90% acc given min cues.  >>In progress. Reviewed exercises, patient states he will continue to complete them independently/with wife.   [name] will demonstrate understanding of clinical signs and symptoms of aspiration at 80% accuracy: given min cues.  >>In progress. Discussed silent nature of aspiration continued on MBS, however amount of aspirated material and frequency of aspiration was significantly less compared to prior studies.   Patient will demonstrate use of compensatory strategy: (super supraglottic swallow) in 90% of opportunities given min cues.  >>In progress. Handout provided with super supraglottic swallow. Reviewed with patient and wife how to implement with thin liquids via tsp. Requires reinforcement.   Patient will demonstrate safe swallowing behaviors: (upright, small sips via tsp, multiple swallows, oral care, etc.) in 90% of opportunities given min cues.  >>In progress. Handout given to wife and patient. Reviewed specific safe swallow behaviors including tsp sips of thin, super supraglottic swallow, multiple swallows per bolus, and need for continued oral care. Wife demonstrates understanding, but would benefit from  reinforcement.   Time frame for goals to be met in: 11/19/21  Goals Long Term Dysphagia  Patient will tolerate the least restrictive diet as determined via instrumental assessment with 100% accuracy independently.  >>In progress. See MBS study procedure note from 10/10.   Time frame for goals to be met in: 11/26/21    Patient and Family Education  Patient and Family Education: Patient educated on, current POC, role of SLP, instrumental procedure, swallowing anatomy & physiology, current diet recommendations, risks of aspiration, safe swallowing behaviors, dysphagia, results of instrumental examination  Education response: Patient demonstrated understanding    End of Session:  Patient was left in bed with call light within reach and all needs met.     Sherene Sires, M.A, CCC-SLP  Speech Language Pathologist--Rehab Services    [facility]  MBSImP Certified Clinician    Time  Start Time: [phone]  Stop Time: 1010  Time Calculation (min): 15 min    Charges   $Swallow Tx : 1 Procedure

## 2021-11-09 NOTE — Unmapped (Addendum)
CONTINUITY OF CARE FORM     Patient name: Matthew [name]  Patient MRN: [id number]  DOB: 13-Jul-1945  Age: 76 y.o.  Gender: male    Date of admission: 10/29/2021  Date of discharge: 11/09/2021    Attending provider: Tempie Hoist, MD  Primary care physician: Demetrius Charity, MD    Code status: Full Code  Allergies:   Allergies   Allergen Reactions    Animal Dander      Causes congestion    Adhesive Tape-Silicones Rash       Diagnoses Present on Admission   Primary Diagnosis: Postoperative CSF leak  Discharge Diagnosis :   Active Hospital Problems    Diagnosis Date Noted    Postoperative CSF leak [G97.82, G96.00] 11/02/2021    Other dysphagia [R13.19] 11/02/2021      Resolved Hospital Problems   No resolved problems to display.     Prognosis: excellent  Rehabilitation potential: excellent        Diet     Diet/Nutrition Orders    Continuous tube feedings     Frequency: Continuous     Number of Occurrences: Until Specified     Order Questions:      Product Name: Peptamen AF- semi-elemental (equal to Vital AF)      Strength: FULL STRENGTH      Tube Initial ml/hr: 20      Max Target Rate ml/hr: 60      Comment/Titrate: Increase 25ml q12hrs to target      Tube Feed Location: Nasogastric feeding tube    Dysphagia Level 1: Pureed Thin With T-spoon     Frequency: Effective Now     Number of Occurrences: Until Specified     Order Comments: With T-spoon       Order Questions:      Fluid consistency: Thin      Suicide/Behavior Risk Modification? No     Dysphagia Assessment and Recommendations (when available): Aspiration Risk Recommendations: Modified barium swallow study  Dysphagia Diagnosis: pending further imaging  Recommended Solid for Diet: Dysphagia 1 (puree)  Recommended Liquid for Diet: thin liquids (via tsp only)  Medication Administration: crushed, puree, feeding tube  Dysphagia Diet Recommended (when available):      Dysphagia type 1 thin liquid with teaspoon     Services Required   Skilled Nursing: Yes    PT  Interventions and Frequency: Treatment/Interventions: LE strengthening/ROM, Museum/gallery curator, Therapeutic Activity, Gait training, Therapeutic Exercise, Patient/family training, Neuromuscular Reeducation  PT Frequency: minimum 3x/week    PT Recommendations: Recommendation: IP Rehab  Equipment Recommended: Rolling walker, Patient already has needed DME    OT Interventions and Frequency: Treatment Interventions: ADL retraining, Patient/Family training, Activity Tolerance training, Compensatory technique education, Energy Conservation, Functional transfer training, Therapeutic Activity, IADL retraining, Cognitive reorientation, UE strengthening/ROM, Excercise  Progress: Progressing toward goals  OT Frequency: minimum 3x/week    OT Recommendations: Recommendation: IP Rehab  Equipment Recommendations: Patient already has needed DME    Weight bearing status:  full    Bedside Swallow Recommendations (when available): Recommendation: SLP recommendation pending clinical progress    Speech Language Recommendations (when available):      Needs 24 hour supervision due to cognitive impairment: No    Discharge Medications   Medications:     Medication List        TAKE these medications, which are NEW        Quantity/Refills   gabapentin 100 MG capsule  Commonly known as: NEURONTIN  Take 2 capsules (200  mg total) by mouth 3 times a day.  Replaces: gabapentin 600 MG tablet   Quantity: 90 capsule  Refills: 0     melatonin 3 mg Tab  Take 3 tablets (9 mg total) by mouth at bedtime.   Refills: 0     methocarbamoL 500 MG tablet  Commonly known as: ROBAXIN  Take 1 tablet (500 mg total) by mouth 3 times a day as needed.   Quantity: 90 tablet  Refills: 0     QUEtiapine 25 MG tablet  Commonly known as: SEROQUEL  Take 1 tablet (25 mg total) by mouth at bedtime.   Refills: 0            TAKE these medication, which have CHANGED        Quantity/Refills   acetaminophen 325 MG tablet  Commonly known as: TYLENOL  Take 2 tablets (650 mg total) by  mouth every 6 hours.  What changed:   how much to take  when to take this  reasons to take this   Quantity: 30 tablet  Refills: 0            TAKE these medications, which you were ALREADY TAKING        Quantity/Refills   albuterol 90 mcg/actuation inhaler  Commonly known as: PROVENTIL  Inhale 2 puffs into the lungs every 6 hours as needed for Wheezing or Shortness of Breath.   Refills: 0     ALPHA LIPOIC ACID ORAL  Take 1 capsule by mouth daily.   Refills: 0     amLODIPine 5 MG tablet  Commonly known as: NORVASC  Take 1 tablet (5 mg total) by mouth daily.   Refills: 0     azelastine 137 mcg (0.1 %) nasal spray  Commonly known as: ASTELIN  Use 2 sprays into each nostril 2 times a day.   Quantity: 30 mL  Refills: 6     baclofen 10 MG tablet  Commonly known as: LIORESAL  Take 1 tablet (10 mg total) by mouth daily as needed (Muscle spasms).   Refills: 0     bimatoprost 0.01 % Drop  Commonly known as: LUMIGAN  Place 1 drop into both eyes daily.   Refills: 0     brimonidine 0.2 % ophthalmic solution  Commonly known as: ALPHAGAN  Place 1 drop into the right eye 2 times a day.   Refills: 0     cetirizine 10 MG tablet  Commonly known as: ZYRTEC  Take 1 tablet (10 mg total) by mouth daily.   Quantity: 30 tablet  Refills: 11     doxepin 10 MG capsule  Commonly known as: SINEQUAN  Take 1 capsule (10 mg total) by mouth at bedtime as needed.   Refills: 0     finasteride 5 mg tablet  Commonly known as: PROSCAR  Take 0.5 mg by mouth daily.   Refills: 0     fluticasone propionate 50 mcg/actuation nasal spray  Commonly known as: FLONASE  Use 1 spray into each nostril daily.   Refills: 0     naloxone 4 mg/actuation Spry  Commonly known as: NARCAN  Apply 1 spray in one nostril if needed. Call 911. May repeat dose in other nostril if no response in 3 minutes.   Quantity: 2 each  Refills: 1     oxyCODONE 5 MG immediate release tablet  Commonly known as: ROXICODONE  Take 1 tablet (5 mg total) by mouth every 4 hours as needed for up to  7  days.   Quantity: 30 tablet  Refills: 0     rosuvastatin 10 MG tablet  Commonly known as: CRESTOR  Take 1 tablet (10 mg total) by mouth every evening.   Quantity: 30 tablet  Refills: 5     senna-docusate 8.6-50 mg per tablet  Commonly known as: SENNA-S  Take 1 tablet by mouth 2 times a day.   Quantity: 30 tablet  Refills: 0     tadalafiL 20 MG tablet  Commonly known as: CIALIS  Take 1 tablet (20 mg total) by mouth daily as needed for Erectile Dysfunction.   Refills: 0     timolol 0.5 % ophthalmic solution  Commonly known as: TIMOPTIC  Place 1 drop into both eyes every 12 hours.   Refills: 0            STOP taking these medications      gabapentin 600 MG tablet  Commonly known as: NEURONTIN  Replaced by: gabapentin 100 MG capsule               Where to Get Your Medications        Information about where to get these medications is not yet available    Ask your nurse or doctor about these medications  acetaminophen 325 MG tablet  gabapentin 100 MG capsule  melatonin 3 mg Tab  methocarbamoL 500 MG tablet  oxyCODONE 5 MG immediate release tablet  QUEtiapine 25 MG tablet               Discharge Specific Orders   Discharge specific orders:  DISCHARGE INSTRUCTIONS AFTER YOUR SPINE SURGERY:     RULES FOR THE CARE OF YOUR INCISION:    You MUST wash your incision with gentle soap and water at least daily, more frequently if soiled or sweaty.      It is okay to shower and get your incision wet. No submerging your incision underwater as in a bathtub, pool or hot tub.    Please leave your incision open to air unless otherwise directed.     DO NOT touch your incision without first washing your hands. Avoid touching your incision as much as possible.    Please call your neurosurgeon's office immediately if you notice any of the following: increased redness, swelling, or any drainage from your incision or if you start running a fever > 101.5    QUESTIONS OR CONCERNS:  If you have a question or concern regarding your care after you  get home you can:    Call the Spine Help line [phone].  This line is only available during daytime hours, no weekends or holidays.    Our office number is [phone] or you are welcome to send a message on MyChart.      If you have an urgent concern during an evening, weekend, or holiday call the neurosurgery resident on call. Dial [phone] and ask the operator to have the on call neurosurgery resident called.     ACTIVITY AFTER YOUR SURGERY:     - Progressively increase your activity as tolerated following the recommendations of any inpatient or outpatient postoperative therapy recommendations.  - No bending at waist, lifting >10lbs, no sudden twisting.    - Nothing more strenuous than daily activities or walking.  No running, jogging, swimming, etc...until cleared by neurosurgeon in outpatient follow up.    - Do not drive unless instructed otherwise by your neurosurgeon.  Avoid driving/operating dangerous or heavy machinery while on  pain medications     MEDICATION INSTRUCTIONS:     - Wean off narcotic pain medications as tolerated.    - Recommend vitamin D and calcium supplement during first 8-12 weeks after spinal fusion, if not already taking.  - Hold Aspirin, Plavix, Coumadin, Heparin or other anticoagulant/antiplatelet agents for 2 weeks after surgery unless instructed otherwise.  These medications may increase your risk of bleeding.     - Do not take NSAIDs which includes Ibuprofen/Advil, naproxen/Aleve, Celecoxib/Celebrex.  These should not be taken for 3 months after your surgery.  - Constipation is a common side effect of opiate pain medications; you should take a daily stool softener to help prevent constipation from developing while on narcotic pain medications.  One or two medications should have been provided at discharge to prophylaxis against constipation. Should you experience constipation despite these medications, contact our office should the condition worsen or should you stop  passing gas.      Helpful hints after back surgery     ACTIVITY   Walking is encouraged. At least a 5 minute walk every hour during waking hours.   No other form of exercises until follow up appointment   May go up stairs  May ride in a car but no driving until follow up appointment  No sexual activity until follow up appointment  No heavy lifting over 10 lbs. (milk carton)  No lifting of objects over your head  No cleaning (vacuuming, cutting grass, etc) until follow up appointment   No bending at the waist. Bend with your knees  Use of computer limited to 1 hour 3 times a day    DIET  Dysphagia 1 w/ thin liquids; by teaspoon  Drink fluids to stay hydrated  Avoid constipation use stool softeners    WOUND  OK to shower but no tub baths  Remove dressing after 2 days  If your incision has steri-strips, let these naturally fall off. If they are still present in one week, you may gently remove them  Any staples or sutures will be removed at your follow-up visit 2 weeks after your surgery  Inspect wound daily for redness or drainage    IF THE INCISION HAS DERMABOND ADHESIVE PLEASE FOLLOW THESE RULES:    1) Do not apply liquid or ointment medications or cleansers onto wounds closed with Dermabond Adhesive, as these substances can weaken the polymerized film leading to skin edge separation.   2)  Do not pick at the wound or film/glue, as this can disrupt its adhesion and cause dehiscence or opening of the wound.   3) Allow the glue to slough naturally (usually in 5-10 days).    4)  There should be only occasional wetting of the wound, but patients may shower and bathe the site gently.  Do not soak or scrub the site or expose it to prolonged wetness until the film has completely sloughed off naturally and the wound has healed closed.      PAIN MEDICATION  You will receive narcotics for 2 weeks and Tylenol for pain  You may receive muscle relaxants for muscle spasms     WHEN TO CALL YOUR DOCTOR  Temperature greater than 101  F  Drainage from the wound site  New onset of weakness or numbness of extremities.   If not previously scheduled, call the office for a follow up appointment for 2 weeks after your surgery [phone]    Isolation     Patient Isolation Status  None to display              Physician Certification of Medically Necessary Transportation   Type and reason for transportation: Stretcher - patient is non-ambulatory.  Reason for transport to another facility: Rehabilitation  Patient requires: Continuous medical supervision enroute    Follow-up Appointments and [facility] Dis[facility] Physician Name     Future Appointments   Date Time Provider Department Center   01/17/2022  2:00 PM Loura Halt, MD [facility] NEUR [facility]     CCM [facility]  [address] [place] [address]  [phone]        SNF to call their PCP for an appointment upon discharge.  No follow-ups on file.    Physician Signature and Credentials   I certify that I have reviewed the information contained herein, and that the information is a true and accurate reflection of the individual's condition.    Discharging Physician: Electronically signed by Derrill Center, CNP  11/09/2021, 11:09 AM    SOCIAL WORK DOCUMENTATION     Facility/Agency Name: Inpatient Rehabilitation Facility Name: [facility]    Number to call report: Inpatient Rehabilitation Facility Provider Contact Number: REPORT; [phone]; FAX: [phone]    Level of Care at Discharge:             Less than 30 day convalescent stay:      PASARR/HENS 7000 Completed:      Family Member Name and Relationship Notified at Discharge: Terrilee Files, spouse    Family Contact Number: [phone]    Social Worker or Discharge Planner Name and Telephone Number: Elliot Dally RN CCM [phone]      NURSE DISCHARGE ASSESSMENT   Vitals:  Patient Vitals for the past 4 hrs:   BP Temp Temp src Pulse Resp SpO2   11/09/21 0904 165/90 98.1 F (36.7 C)  Oral 79 16 96 %        Orientation:       Orientation Level: Oriented X4  Patient Behaviors: Calm, Cooperative    Respiratory:  Respiratory (WDL): Within Defined Limits  Respiratory Pattern: Regular, Easy  Chest Assessment: Chest expansion symmetrical  Bilateral Breath Sounds: Clear  R Breath Sounds: Clear  L Breath Sounds: Clear      Cardiac:  Cardiac (WDL): Within Defined Limits  Heart Sounds: S1, S2    Edema:  Peripheral Vascular (WDL): Within Defined Limits  Edema:  (neck)    Wounds:  Incision 10/30/21 Neck dsd, tape-Site (Wound bed) Assessment: Closed wound edges  Incision 10/30/21 Neck dsd, tape-Site (Wound bed) Assessment: Closed wound edges    Comfort/Mattress:  Additional Comfort/Environmental Interventions: Positioning frequency    Musculoskeletal:  Musculoskeletal (WDL): Exceptions to WDL  LUE: Full movement  RUE: Full movement  RLE: Full movement  LLE: Full movement    GI:  Gastrointestinal (WDL): Within Defined Limits  GI Symptoms: Bloating  Last BM Date: 11/07/21  Bowel Incontinence: No  Stool Source: Rectum    GU:  Genitourinary (WDL): Within Defined Limits  Genitourinary Symptoms: None  Urinary Incontinence: No  Urine Source: Urethra    Lines and Drains:  Patient Lines/Drains/Airways Status       Active Line / PIV Line       None                    ADL's:  Level of Assistance: Moderate assist, patient does 50-74%  Feeding: Needs assist  Level of Assistance:  Dependent    Morse Fall Risk  Morse Fall Risk Score: (!) 60    Restraints:       RN to RN Handoff:  Nurse Giving Report:: sharethea rn  Reason for Handoff:: Shift Change        Nurse and Credentials   RN Handoff Completed by:   on 11/09/2021

## 2021-11-09 NOTE — Unmapped (Signed)
[place]  Case Management/Social Work Department  Progress Note    Patient Information     Patient Name: Matthew [name]  MRN: [id number]  Hospital day: 79  Inpatient/Observation:  Inpatient   Level of Care:  Floor  Admit date:  10/29/2021  Admission diagnosis: CSF leak [G96.00]  Postoperative CSF leak [G97.82, G96.00]    PMH:  has a past medical history of Cancer (CMS-HCC), Environmental allergies, Hearing loss, High cholesterol, Hypertension, Neck symptom, Sleep apnea, and Sleep disorder.    PCP:  Demetrius Charity, MD    Home Pharmacy:    [facility] [address], [place] - [address]  [address] [facility]  [place] [address]  Phone: [phone]     [facility]  [address]  [place] [address]  Phone: [phone]         Medical Insurance Coverage:  Payor: MEDICARE / Plan: MEDICARE A AND B / Product Type: Medicare /     Other Pertinent Information     RN Care Coordinator participated in interdisciplinary rounds with the MD team and completed chart review. Patient is accepted at [facility]. CM received call form Arline Asp with [facility] that their PMR has accepted him after reviewing PM&R notes, and are willing to accept with NGT and NPO .  Cindy alerted CM that they are unable to send patient out for any appts or MBS for 2 weeks since it will not be covered.  CM updated Spouse , she wished to confirm if MBS was going to be needed.  Otila Kluver with [facility] on above  Cm messaged SLP after MBS this morning, he passed for a D1 diet with thins by tsp, and will not need a MBS for perhaps at least 2-3 weeks.     1030: Spoke with wife, she has decided to go with [facility].  CM updated Cindy with [facility].   Referral sent to [facility] requesting arrival by 1PM or after 4PM, per Stacey's request.     Discharge Plan     Anticipated discharge plan:  IPR     Anticipated discharge date:  today     CM/SW will continue to follow and remain available for  discharge planning needs.      Elliot Dally RN CCM  [phone]

## 2021-11-09 NOTE — Unmapped (Signed)
DISCHARGE INSTRUCTIONS AFTER YOUR SPINE SURGERY:     RULES FOR THE CARE OF YOUR INCISION:    You MUST wash your incision with gentle soap and water at least daily, more frequently if soiled or sweaty.      It is okay to shower and get your incision wet. No submerging your incision underwater as in a bathtub, pool or hot tub.    Please leave your incision open to air unless otherwise directed.     DO NOT touch your incision without first washing your hands. Avoid touching your incision as much as possible.    Please call your neurosurgeon's office immediately if you notice any of the following: increased redness, swelling, or any drainage from your incision or if you start running a fever > 101.5    QUESTIONS OR CONCERNS:  If you have a question or concern regarding your care after you get home you can:    Call the Spine Help line [phone].  This line is only available during daytime hours, no weekends or holidays.    Our office number is [phone] or you are welcome to send a message on MyChart.      If you have an urgent concern during an evening, weekend, or holiday call the neurosurgery resident on call. Dial [phone] and ask the operator to have the on call neurosurgery resident called.     ACTIVITY AFTER YOUR SURGERY:     - Progressively increase your activity as tolerated following the recommendations of any inpatient or outpatient postoperative therapy recommendations.  - No bending at waist, lifting >10lbs, no sudden twisting.    - Nothing more strenuous than daily activities or walking.  No running, jogging, swimming, etc...until cleared by neurosurgeon in outpatient follow up.    - Do not drive unless instructed otherwise by your neurosurgeon.  Avoid driving/operating dangerous or heavy machinery while on pain medications     MEDICATION INSTRUCTIONS:     - Wean off narcotic pain medications as tolerated.    - Recommend vitamin D and calcium supplement during first 8-12 weeks after spinal  fusion, if not already taking.  - Hold Aspirin, Plavix, Coumadin, Heparin or other anticoagulant/antiplatelet agents for 2 weeks after surgery unless instructed otherwise.  These medications may increase your risk of bleeding.     - Do not take NSAIDs which includes Ibuprofen/Advil, naproxen/Aleve, Celecoxib/Celebrex.  These should not be taken for 3 months after your surgery.  - Constipation is a common side effect of opiate pain medications; you should take a daily stool softener to help prevent constipation from developing while on narcotic pain medications.  One or two medications should have been provided at discharge to prophylaxis against constipation. Should you experience constipation despite these medications, contact our office should the condition worsen or should you stop passing gas.      Helpful hints after back surgery     ACTIVITY   Walking is encouraged. At least a 5 minute walk every hour during waking hours.   No other form of exercises until follow up appointment   May go up stairs  May ride in a car but no driving until follow up appointment  No sexual activity until follow up appointment  No heavy lifting over 10 lbs. (milk carton)  No lifting of objects over your head  No cleaning (vacuuming, cutting grass, etc) until follow up appointment   No bending at the waist. Bend with your knees  Use of computer limited to   1 hour 3 times a day    DIET  Dysphagia 1 w/ thin liquids; by teaspoon  Drink fluids to stay hydrated  Avoid constipation use stool softeners    WOUND  OK to shower but no tub baths  Remove dressing after 2 days  If your incision has steri-strips, let these naturally fall off. If they are still present in one week, you may gently remove them  Any staples or sutures will be removed at your follow-up visit 2 weeks after your surgery  Inspect wound daily for redness or drainage    IF THE INCISION HAS DERMABOND ADHESIVE PLEASE FOLLOW THESE RULES:    1) Do not apply liquid or ointment  medications or cleansers onto wounds closed with Dermabond Adhesive, as these substances can weaken the polymerized film leading to skin edge separation.   2)  Do not pick at the wound or film/glue, as this can disrupt its adhesion and cause dehiscence or opening of the wound.   3) Allow the glue to slough naturally (usually in 5-10 days).    4)  There should be only occasional wetting of the wound, but patients may shower and bathe the site gently.  Do not soak or scrub the site or expose it to prolonged wetness until the film has completely sloughed off naturally and the wound has healed closed.      PAIN MEDICATION  You will receive narcotics for 2 weeks and Tylenol for pain  You may receive muscle relaxants for muscle spasms     WHEN TO CALL YOUR DOCTOR  Temperature greater than 101 F  Drainage from the wound site  New onset of weakness or numbness of extremities.   If not previously scheduled, call the office for a follow up appointment for 2 weeks after your surgery [phone])

## 2021-11-09 NOTE — Unmapped (Signed)
Speech Language Pathology  Modified Barium Swallow Impairment Profile Study  Name: Matthew [name]  DOB: [birth year]  Attending Physician: Tempie Hoist, MD  Admission Diagnosis: CSF leak [G96.00]  Postoperative CSF leak [G97.82, G96.00]  Date: 11/09/2021  Reviewed Pertinent hospital course: Yes  Hospital Course SLP: 76 y/o male s/p C3-4 ACDF on 9/19 complicated by intraoperative CSF leak presents with progressive R neck swelling concerning for continued CSF leak. 9/30: underwent CSF leak repair and neck exploration.  Imaging: No recent chest imaging.  SLP Hx: 9/20: seen for bedside swallow evaluation with recs for regular diet with thin liquids.    Assessment  Patient presents with improving oropharyngeal dysphagia characterized by poor bolus control, reduced hyolaryngeal elevation/excursion, reduced epiglottic inversion, reduced base of tongue retraction, poor glottic closure, reduced pharyngeal stripping wave, and reduced distention of UES opening. Minimal-moderate residue noted to pyriform sinuses across consistencies inconsistently cleared by spontaneous subsequent swallows. Silent aspiration of thin barium occurred 1x via tsp and 1x via straw; however the combination of thin barium via tsp and super supraglottic swallow maneuver w/ multiple secondary swallows was effective in protecting the airway. Overall, significant improvement in airway protection w/ liquids is observed and cough strength is improved. Based on pt's improvement, family support, and ability to adhere for compensatory strategies, recommend Dysphagia 1 diet w/ Thin liquids VIA TSP ONLY w/ super supraglottic swallow maneuver, meds crushed in puree or via TF. Patient would continue to benefit from further education and implementation of swallow strategies/exercise regimen. SLP services still warranted at this time.    Plan/Recommendation  ST while in house: recommended  ST while in house frequency: 1-3x/week  ST at discharge:  recommended  Recommended Solid for Diet: Dysphagia 1 (puree)  Recommended Liquid for Diet: thin liquids (VIA TSP ONLY)  Medication Administration: crushed, puree or via feeding tube  Compensatory Swallowing Strategies/ Safe Swallowing Behaviors: Upright as possible for all oral intake, Small bites/sips, Oral care before meals, Super supraglottic swallow maneuver, Eat/feed slowly, Multiple swallows per bite/sip     Silent aspiration of thin via straw        Background/Subjective  Past Medical History:   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 Aspiration of above consistencies occurred during and after swallow initiation. Recommend NPO with consideration for long-term means of alternative nutrition given pt's impaired swallow safety and efficiency.    Orientation:  Person: Yes  Place: Yes  Time: Yes  Situation: Yes    Pain:  No report of pain.    Trials Administered & Pen Asp Scale Score    Pen Asp   Score            Description of Events  1.            Material does not enter airway  2.  Material enters the airway, remains above the vocal folds,                  and is ejected from the airway.  3.            Material enters the airway, remains above the vocal folds,                  and is not ejected from the airway.  4             Material enters the airway, contacts the vocal folds,                  and is ejected from the airway.  5.            Material enters the airway, contacts the vocal folds,                   and is not ejected from the airway.  6.            Material enters the airway, passes below the vocal folds,                  and is ejected into the larynx or out of the airway.  7.            Material enters the airway, passes below the vocal folds,                 and is not ejected from the trachea despite effort.  8.           Material enters the airway, passes below the vocal folds,                 and no effort is made to eject.    Thin  Order of Trials: 1-3, 6-7  Pen/Asp Scale Score: MAX: 8  Pen/Asp Scale Score: MOST: 3  Presentation: Spoon, Straw    Puree  Order of Trials: 4-5  Pen/Asp Scale Score: 3  Presentation: Spoon    Oral Phase  Lip Closure: 0 - no labial escape  Tongue Control/Bolus Hold: 3 - posterior escape of greater than half of bolus  Bolus/Prep/Mastication: UTA  Bolus Transport/Lingual Motion: 0 - brisk tongue motion  Oral residue: 2 - residue collection on oral structures  Onset of Pharyngeal Swallow: 2 - bolus head at posterior laryngeal surface of epiglottis    Pharyngeal Phase  Soft Palate Elevation: 0 - no bolus between soft palate (SP) /pharyngeal wall (PW)  Laryngeal Elevation: 1 - partial superior movement of thyroid cartilage/partial approximation of arytenoids to epiglottic petiole  Anterior Hyoid Excursion: 1 - partial anterior movement  Epiglottic Movement: 1 - partial inversion  Laryngeal Vestibule Closure: 1 - incomplete, narrow column air/contrast in laryngeal vestibule  Pharyngeal Stripping Wave: 1 - reduced  Pharyngeal Constriction: UTA  Pharyngoesophageal Segment Opening: 1 - partial distension/partial duration, partial obstruction of flow  Tongue Base retraction: 2 - narrow column of contrast or air between TB and pharyngeal wall  Pharyngeal residue: pyriform sinuses, pharyngeal wall, tongue base, valleculae, diffuse > 3 areas, 2 - collection of residue within or on pharyngeal structures  Esophageal clearance: UTA  Compensatory Strategies Trialed: Chin tuck, effortful  swallow, super supraglottic swallow    Score Results  (O) Oral Impairment Score: 7  (P) Pharyngeal Impairment Score: 10  (E) Esophageal Impairment Score: -    Goals  Goals Short Term Dysphagia  Patient will demonstrate  use of swallowing exercises: for laryngeal closure and pharyngeal constriction with 90% acc given min cues.  [name] will demonstrate understanding of clinical signs and symptoms of aspiration at 80% accuracy: given min cues.  Patient will demonstrate use of compensatory strategy: (super supraglottic swallow) in 90% of opportunities given min cues.  Patient will demonstrate safe swallowing behaviors: (upright, small sips via tsp, multiple swallows, oral care, etc.) in 90% of opportunities given min cues.  Time frame for goals to be met in: 11/19/21    Goals Long Term Dysphagia  Patient will tolerate the least restrictive diet as determined via instrumental assessment with 100% accuracy independently: with 100% acc independently.  Time frame for goals to be met in: 11/26/21    Patient and Family Education  Patient and Family Education: Patient educated on, current POC, role of SLP, instrumental procedure, swallowing anatomy & physiology, current diet recommendations, risks of aspiration, safe swallowing behaviors, dysphagia, results of instrumental examination  Education response: Patient demonstrated understanding    Bayard Hugger, SLP Student  [facility]  Rehabilitation Services    Time  Start Time: [phone]  Stop Time: 0935  Time Calculation (min): 25 min    Charges   $ MBS Flouro Eval: 1 Procedure       Patient Class   Inpatient

## 2021-11-09 NOTE — Unmapped (Signed)
[place]    Case Manager/Social Worker Discharge Summary     Patient name: Matthew [name]                                        Patient MRN: [id number]  DOB: Aug 01, 1945                              Age: 76 y.o.              Gender: male  Patient emergency contact: Extended Emergency Contact Information  Primary Emergency Contact: [facility]  Address: [address]           [place], [place] [id number] [place]  Home Phone: [phone]  Relation: Spouse  Secondary Emergency Contact: Elwin MochaHollingsworth Rose,Lauren  Address: [address]           [place], [place] [id number] [place]  Mobile Phone: [phone]  Relation: Daughter      Attending provider: Tempie HoistBenjamin Motley, MD  Primary care physician: Demetrius CharityANNA SCHWEIGHART, MD    The MD has indicated that the patient is ready for discharge.  Matthew [name] was [facility] and [facility] Provider Contact Number: REPORT; [phone]; FAX: [phone].  The [facility] [phone] at 12-12:30Pm    Transfer Mode/Level of Care: Basic Life Support Stretcher (BLS)    DC Summary and COC have been faxed to facility.     The plan has been reviewed:     Patient/Family Informed of Discharge Plan: Yes    Plan Reviewed With Patient, Family, or Significant Other: Yes    Patient and or family are aware and in agreement with the discharge plan: Yes    Family Member Name and Relationship Notified at Discharge: [name], spouse   Family Contact Number: [phone]    Plan reviewed with MD and other members of the health care team: Yes  Care Plan Completed: Yes    No further CM/SW needs.    This plan has been reviewed with the multi-disciplinary team.     Treatment Preferences    Treatment Preferences: Other (provide comment)    Post-Discharge Goals    Patient's Post-Discharge goals:  return home safely    Post Acute Care Provider Information:  [facility]   Inpatient Rehabilitation Facility Provider Contact Number: REPORT; [phone]; FAX: [phone]    Community Services at [facility]  Community Services at Home post discharge: Outpatient Therapy    Elliot DallyRobin Nancylee Gaines RN CCM  [phone]

## 2021-11-09 NOTE — Unmapped (Signed)
[facility]  DEPARTMENT OF NEUROSURGERY  INPATIENT PROGRESS NOTE  Matthew [name]  [id number]  07/16/1945    HD: 12 POD: 10    Neurosurgery Attending: Motley, MD    Interval History:  - No acute events overnight  - continued difficulty sleeping during the night, though this is a chronic problem for him  - neuro exam stable    Meds:  Current Facility-Administered Medications   Medication Dose Frequency Provider Last Admin    acetaminophen  650 mg Q6H Vickie Epley, MD 650 mg at 11/09/21 5409    amLODIPine  5 mg Daily 0900 Leontine Locket, MD 5 mg at 11/08/21 0908    AMOXicillin-clavulanate  1 tablet 2 times per day Vickie Epley, MD 1 tablet at 11/08/21 2134    atorvastatin  40 mg Nightly (2100) Leontine Locket, MD 40 mg at 11/08/21 2134    bisacodyL  10 mg Daily PRN Leontine Locket, MD 10 mg at 11/03/21 1815    brimonidine  1 drop BID Leontine Locket, MD 1 drop at 11/08/21 2133    cetirizine  10 mg Daily 0900 Leontine Locket, MD 10 mg at 11/08/21 0908    doxepin  10 mg Nightly (2100) Leontine Locket, MD 10 mg at 11/08/21 2134    finasteride  5 mg Daily 0900 Leontine Locket, MD 5 mg at 11/08/21 8119    gabapentin  200 mg TID Clarise Cruz, MD 200 mg at 11/08/21 1233    gabapentin  200 mg Nightly (2100) Clarise Cruz, MD 200 mg at 11/08/21 2137    heparin  5,000 Units 3 times per day Vickie Epley, MD 5,000 Units at 11/09/21 1478    latanoprost  1 drop Nightly (2100) Leontine Locket, MD 1 drop at 11/08/21 2133    melatonin  9 mg Nightly (2100) Abhijith Matur, MD      methocarbamoL  750 mg 4x Daily PRN Clarise Cruz, MD 750 mg at 11/08/21 1234    ondansetron  4 mg Q6H PRN Leontine Locket, MD 4 mg at 11/02/21 2956    oxyCODONE  2.5 mg Q4H PRN Clarise Cruz, MD 2.5 mg at 11/05/21 1800    Or    oxyCODONE  5 mg Q4H PRN Clarise Cruz, MD 5 mg at 11/07/21 1044    QUEtiapine  25 mg Nightly (2100) Clarise Cruz, MD 25 mg at 11/08/21 2134    senna-docusate  1 tablet BID Leontine Locket, MD 1 tablet at 11/08/21 2134     timolol  1 drop Q12H Leontine Locket, MD 1 drop at 11/08/21 2133       Examination:  Temp:  [98.2 F (36.8 C)-98.6 F (37 C)] 98.6 F (37 C)  Heart Rate:  [90-92] 92  Resp:  [18-20] 18  BP: (148-158)/(86-89) 148/86    General:   Wound: c/d/I    Neurological  -GCS: 15  -Orientation: alert, oriented x 3 (person, place, and time)  -Language: speech fluent and appropriate  -Cranial Nerves: PERRL, EOMI, face symmetric, hearing intact, tongue midline   -Motor: FCCx4, full and symmetric strength and ROM    D EF EE HG IO   RUE:    5 5 5 5 5    LUE:     5 5 5 5 5     HF KE DF EHL PF   RLE: 5 5 5 5 5    LLE: 5 5 5 5 5   -Sensation: sensation intact to  light touch and symmetric in bilateral upper and lower extremities  -Reflex: no hyperreflexia or clonus appreciated, Hoffman's absent BL, babinski downgoing BL    Labs:  Na/K/Cl/CO2:  139/4.4/104/24 (10/09 2344)  WBC/Hgb/Hct/Plts:  9.1/13.1/38.2/220 (10/09 2344)       Imaging Review  No new imaging to review    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 Patient underwent a C3-4 ACDF on 09/19 with intraoperative CSF leak. Patient admitted now with concern for continued CSF leak; now s/p exploration and repair of CSF leak, lumbar drain placement    PLAN:  - LD removed  - HOB >45 degress at all times   - Medicine on board, appreciate recs  - PM+R consulted   - Pain control  - Bowel reg  - NGT currently in place   - TF for now   - MBS today   - if passes for soft diet, will work towards discharge  - Delirium precautions  - DVT prophylaxis: SCDs/SQH   - Dispo: IPR     This note was copied forward from the note written by Dr. Purnell ShoemakerKaye.  I have reviewed and updated the history, physical exam, data, assessment and plan of the note so it reflects the evaluation and management of the patient on today's date.     Please page [phone] with any  questions or concerns.     Signora Zucco M. Shamari Lofquist, M.D.  Neurosurgery Resident  7:08 AM

## 2021-11-09 NOTE — Unmapped (Signed)
Report given to transport. Attempted calling report to [facility] x2, no answer. Pt leaving with NG intact.

## 2021-11-10 LAB — CBC
Hematocrit: 38.6 % (ref 38.5–50.0)
Hemoglobin: 13.1 g/dL (ref 13.2–17.1)
MCH: 32.2 pg (ref 27.0–33.0)
MCHC: 34 g/dL (ref 32.0–36.0)
MCV: 94.7 fL (ref 80.0–100.0)
MPV: 8.1 fL (ref 7.5–11.5)
Platelets: 290 10*3/uL (ref 140–400)
RBC: 4.07 10*6/uL (ref 4.20–5.80)
RDW: 13.5 % (ref 11.0–15.0)
WBC: 9 10*3/uL (ref 3.8–10.8)

## 2021-11-10 LAB — COMPREHENSIVE METABOLIC PANEL
ALT: 64 U/L (ref 7–52)
AST: 35 U/L (ref 13–39)
Albumin: 4.1 g/dL (ref 3.5–5.7)
Alkaline Phosphatase: 70 U/L (ref 36–125)
Anion Gap: 9 mmol/L (ref 3–16)
BUN: 24 mg/dL (ref 7–25)
CO2: 29 mmol/L (ref 21–33)
Calcium: 9.5 mg/dL (ref 8.6–10.3)
Chloride: 102 mmol/L (ref 98–110)
Creatinine: 0.79 mg/dL (ref 0.60–1.30)
EGFR: >90
Glucose: 120 mg/dL (ref 70–100)
Osmolality, Calculated: 295 mOsm/kg (ref 278–305)
Potassium: 4.5 mmol/L (ref 3.5–5.3)
Sodium: 140 mmol/L (ref 133–146)
Total Bilirubin: 0.6 mg/dL (ref 0.0–1.5)
Total Protein: 7.2 g/dL (ref 6.4–8.9)

## 2021-11-10 LAB — PHOSPHORUS: Phosphorus: 3 mg/dL (ref 2.1–4.5)

## 2021-11-10 LAB — VITAMIN D 25 HYDROXY: Vit D, 25-Hydroxy: 54.5 ng/mL (ref 30.0–100.0)

## 2021-11-10 LAB — MAGNESIUM: Magnesium: 2.2 mg/dL (ref 1.5–2.5)

## 2021-11-10 NOTE — Unmapped (Signed)
PT's wife req 1st post op appt be moved to around 3pm. Also req personal email for [employer]. Please advise.

## 2021-11-11 NOTE — Unmapped (Signed)
Called pt wife moved appointment to 11/1 at 10 am and provided email per [facility]

## 2021-11-15 LAB — BASIC METABOLIC PANEL
Anion Gap: 10 mmol/L (ref 3–16)
BUN: 22 mg/dL (ref 7–25)
CO2: 28 mmol/L (ref 21–33)
Calcium: 9.8 mg/dL (ref 8.6–10.3)
Chloride: 104 mmol/L (ref 98–110)
Creatinine: 0.84 mg/dL (ref 0.60–1.30)
EGFR: >90
Glucose: 109 mg/dL (ref 70–100)
Osmolality, Calculated: 298 mOsm/kg (ref 278–305)
Potassium: 3.9 mmol/L (ref 3.5–5.3)
Sodium: 142 mmol/L (ref 133–146)

## 2021-11-15 LAB — CBC
Hematocrit: 36.8 % (ref 38.5–50.0)
Hemoglobin: 12.5 g/dL (ref 13.2–17.1)
MCH: 32.8 pg (ref 27.0–33.0)
MCHC: 34 g/dL (ref 32.0–36.0)
MCV: 96.4 fL (ref 80.0–100.0)
MPV: 8.7 fL (ref 7.5–11.5)
Platelets: 256 10*3/uL (ref 140–400)
RBC: 3.82 10*6/uL (ref 4.20–5.80)
RDW: 13.4 % (ref 11.0–15.0)
WBC: 9.9 10*3/uL (ref 3.8–10.8)

## 2021-11-15 NOTE — Unmapped (Signed)
Per Dr Micheal Likens, plan for RN to reach out to patient to discuss dysphagia/feeding tube use    Patient discharged to SNF, [facility]  RN called SNF at [phone], advised Matthew [name] is patient's RN today, given number to call floor where patient is residing, # [phone]   RN called number provided above,   No answer  No option to leave message

## 2021-11-16 NOTE — Unmapped (Signed)
Called SNF at # [phone]   Spoke with RN Maisie Fus    NG discontinued  Is currently on dysphagia 2, recently upgraded from puree. Overall doing well. Tentative discharge plan home on Friday

## 2021-11-19 NOTE — Unmapped (Signed)
Discharge Summary     Patient:   Matthew [name], Matthew [name]               Age:   76 years     Sex:  Male     DOB:  Jun 04, 1945   Associated Diagnoses:   None   Author:   Sanjuana Mae DO, Kathe Mariner      Admission Date: 11/09/21  Discharge Date: 11/19/21  Attending Physician: Dr. Lorne Skeens    Reason for Admission: Spinal Cord Injury     History of Present Illness:   Matthew [name] 76 y.o. 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  Post operative course complicated by delirium, dysphagia (NPO - silent aspiration), pain and insomnia. Pt presenting to IPR for SCI care. On arrival he had two pressure injuries to his L foot which were not previously seen by patient or his wife; his wife noted he has a hx of peripheral neuropathy. He had episodes of delirium at the hospital that involve him discussing court cases to himself (he is a prior Conservation officer, historic buildings) which required the initiation of Seroquel. Pain also was a limiting factor for the patient in returning to his prior functional baseline.    Throughout his admission patient made good progress with his therapies and addressing functional mobility, demonstrating the ability to ambulate >150 feet with supervision/touch assistance. Pain managed via multimodal approach. Patient continued to have sleep difficulties despite resuming Seroquel after taper was started with resolution of delirium. Per review patient does have longstanding issue with sleep but noted some improvement when on 25mg  Seroquel nightly. Held discussion about not adjusting medications at discharge and that home environment will be more amenable to better sleep. Will get PM&R follow up scheduled prior to move to [place] vs  send referral to PM&R clinic in [place], patient and wife agreeable to this plan.    Physical examination:   General: well developed, well nourished, in no acute distress. Sitting in manual wheelchair with wife.  HEENT: normocephalic, atraumatic, normal conjunctiva  Neck: supple, incision present to anterior R neck, well-healing.   Lungs: no increased work of breathing on room air.   Cardiac: extremities well-perfused.  Abdomen: non-distended  Extremities: no cyanosis, clubbing, edema  Skin (prior): Left 4th metatarsal on plantar aspect now with overlying blanching skin, heel pressure wound with callous intact   Right great toe wound remains open with well appropriate surrounding skin without drainage, purulence   Neurologic (prior):  Mental status: awake, alert; oriented to person, place, time, and situation; speech fluent, no dysarthria; answers questions appropriately; follows 100% of commands  Cranial nerves: EOMI, facial expressions symmetric, hearing intact to conversation, normal voice, shoulder elevation symmetric.  Strength:    SAb EF EE WE FF FAb HF KE DF PF   R 4 4 4 5 5 5 5 5 5 5    L 4 4 4 5 5 5 5 5 5 5    Sensation: grossly intact to light touch to BUEs and BLEs, though has a hx of peripheral neuropathy   Psychiatric: cooperative, appropriate mood and affect      Discharge Medications:   acetaminophen 325 mg oral tablet 650 mg = 2 tab, PRN, Oral, q6hr  amLODIPine 5 mg oral tablet 5 mg =  1 tab, Oral, Daily  brimonidine 0.2% ophthalmic solution 1 drop, Eye-Right, BID  diclofenac 1% topical gel 4 gm, PRN, Topical, QID  docusate-senna 50 mg-8.6 mg oral tablet 1 tab, PRN, Oral, BID  doxepin 10 mg oral capsule 10 mg = 1 cap, Oral, QHS  finasteride 5 mg oral tablet 5 mg = 1 tab, Oral, Daily  gabapentin 100 mg oral capsule 200 mg = 2 cap, Oral, BID  gabapentin 400 mg oral capsule 400 mg = 1 cap, Oral, QHS  latanoprost 0.005% ophthalmic solution 1 drop, Eye-Both, QHS  Lipitor 40 mg oral tablet 40 mg = 1 tab,  Oral, QHS  loratadine 10 mg oral tablet 10 mg = 1 tab, Oral, Daily  melatonin 3 mg oral tablet 9 mg = 3 tab, Oral, QHS  methocarbamol 500 mg oral tablet 500 mg = 1 tab, PRN, Oral, QID  MiraLax oral powder for reconstitution 17 gm, PRN, Oral, Daily  QUEtiapine 25 mg oral tablet 25 mg = 1 tab, Oral, QHS  timolol maleate 0.5% ophthalmic solution 1 drop, Eye-Both, BID       Hospital Course:   SCI Rehabilitation:   CSF leak:  -Comprehensive rehabilitation program consisting of PT and OT. Focus on ADL's, transfers, wheelchair mobility, ambulation, bowel/bladder management, and family training. Will monitor patient's progress.   -Spinal precautions: none  -Fall precautions, bed alarm, chair alarm  -Skin precautions, turn q2h, low air loss mattress  - augmentin 500-125 mg qd until 10/11  -10/17: Patient discussed at weekly interdisciplinary team meeting. Making good progress functionally and likely ready for discharge within a week.    Spasticity Management:   - none at this time, continued to monitor for evolving tone     Pain management: stable  - gabapentin 200/200/400 mg --> at discharge discussed that Lyrica may be an appropriate medication to transition to in order to see if improved pain control plus some sleep help due to RLS vs neuropathy at night; will discuss at PM&R follow up due to difficulty with medication monitoring at time of discharge  - robaxin 750 mg q6h prn  - oxy 2.5/5 mg q4h prn   - tylenol 650 mg q6h   - diclofenac qid for L shoulder    Delirium- resolved  Insomnia- Improving  -Maximized sleep wake cycle   -melatonin 9mg  QHS for circadian rhythm normalization  -seroquel 25mg  QHS for sleep/delirium --> 10/17 decreased to 12.5mg  QHS with PRN available-->increased back to 25 mg at patient request 10/18, 10/19 stable continue current dosing and discharged on this medication; discussed that would be appropriate to follow up with PCP or sleep medicine provider to discuss other medication options to help  with sleep difficulties  -trazodone was d/c at [facility] and exchanged for seroquel   -Encouraged mobilization during the day.    -Limited sedating medications during the day    Neurogenic bladder:    -Goal to maintain bladder volumes < , started with bladder scans to check for retention   -Continued finasteride 5 mg qd for BPH    Neurogenic bowel:    -Daily bowel program to ensure adequate bowel movements and prevent fecal incontinence.      Pressure Injuries: improving, wound over R 1st metatarsal nearly healed  -stage 1 pressure wound (about 2 x 1 cm) present to the ball of his L foot, unstageable (about 1 x 1 cm) wound to L heel, stage 2 on dorsum of the R great toe (x2)  -foam bandages order updated, consider  PRAFO    At risk for Orthostatic hypotension:  -Abdominal binder and compression stockings    ------------------------------------------------------------------------------------------------------------------------------------------------------------------  Chronic Medical Conditions:     HTN: stable  - amlodipine 5 mg qd     HLD:  - atorvastatin 40 mg qhs     Glaucoma:  - brimonidine 1 drop BID to R eye   - latanoprost 1 drop qhs   - timolol both eyes q12h    Allergies:  - cetirizine 10 mg qd     Sleep Apnea:  - compliant w/ CPAP at home, was unable to use CPAP w/ NG tube --> 10/17 restarted CPAP with NGT out --> will discuss with social work at discharge ordering updated supplies and Home Health involvement for appropriate utilization at home    ---------------------------------------------------------------------------------------------------------------------------------------------------------------------    Fluids/Electrolytes/Nutrition:   -D1 diet with thin liquids by teaspoon only --> 10/17 minced and moist (level 5) w/ thin liquids per RD/SLP  -NG removed 10/11  -RD consult  -Monitored for signs of dehydration  -CMP on adm and BMP qM    Sleep management: Monitored sleep cycle.  - melatonin 9 mg  qhs   - seroquel 12.5 mg qhs per above --> increased to 25mg  QHS on 10/18  - doxepin 10 mg qhs     DVT prophylaxis:    - heparin 5000u subq --> 10/17 discontinued as patient ambulating >150 feet with therapy    Disposition at Discharge: To home with wife

## 2021-11-24 NOTE — Unmapped (Signed)
Called patient to check in,    He is home from rehab facility, feels like overall things are going well. Is seeing SLP at [facility] tomorrow. Is currently on dysphagia II diet, NG has been removed for a while.   Advised RN will update Dr Micheal Likens and return call, likely next week. Patient agreeable    Future Appointments   Date Time Provider Department Center   11/25/2021 12:30 PM Shirlean Kelly, CCC-SLP [facility] OP [facility]   11/25/2021 12:30 PM Antonieta Iba, OT [facility] OP OT Ulice Brilliant   11/25/2021 12:30 PM Ferman Hamming, PT [facility] OP PT [facility]   11/29/2021 12:30 PM Tressia Miners, OT [facility] OP OT [place]   11/29/2021 12:30 PM Shirlean Kelly, CCC-SLP [facility] OP ST [facility]   11/29/2021 12:30 PM Ferman Hamming, PT [facility] OP PT [facility]   12/01/2021 10:00 AM Derrill Center, CNP [facility] NSUR GNI UCGNI   12/03/2021 12:30 PM Shirlean Kelly, CCC-SLP [facility] OP ST [facility]   12/03/2021 12:30 PM Antonieta Iba, OT [facility] OP OT [facility]   12/03/2021 12:30 PM Ferman Hamming, PT [facility] OP PT [facility]   12/06/2021 12:30 PM Tressia Miners, OT [facility] OP OT [place]   12/06/2021 12:30 PM Shirlean Kelly, CCC-SLP [facility] OP ST [facility]   12/06/2021 12:30 PM Ferman Hamming, PT [facility] OP PT [facility]   12/10/2021 12:30 PM Shirlean Kelly, CCC-SLP [facility] OP ST [facility]   12/10/2021 12:30 PM Antonieta Iba, OT [facility] OP OT [place]   12/10/2021 12:30 PM Ferman Hamming, PT [facility] OP PT [place]   12/13/2021 12:30 PM Tressia Miners, OT [facility] OP OT [place]   12/13/2021 12:30 PM Shirlean Kelly, CCC-SLP [facility] OP ST [facility]   12/13/2021 12:30 PM Ferman Hamming, PT [facility] OP PT [facility]   12/17/2021 12:30 PM Shirlean Kelly, CCC-SLP [facility] OP ST [facility]   12/17/2021 12:30 PM Antonieta Iba, OT [facility] OP OT Ulice Brilliant   12/17/2021 12:30 PM Ferman Hamming, PT [facility] OP PT [place]   01/17/2022  2:00 PM Loura Halt, MD [facility] NEUR GNI UCGNI

## 2021-11-24 NOTE — Unmapped (Signed)
Pts wife left message inquiring about appt with Dr Rodman Comp needing to be rescheduled along with other questions. Gaylyn Rong (RN)  and I both spoke with spouse and came to the conclusion that appointment is not needed right now as it is too soon after discharge and pt will no longer be here after 11/13 due to moving states. RN informed spouse  that we would be able to get discharge records for her, provided her with medical records number and fax. Pt also needed help with rescheduling therapy appointments, RN provided therapy's number.

## 2021-11-25 ENCOUNTER — Encounter: Admit: 2021-11-25 | Discharge: 2021-11-25 | Payer: MEDICARE

## 2021-11-25 DIAGNOSIS — Z789 Other specified health status: Secondary | ICD-10-CM

## 2021-11-25 DIAGNOSIS — M4812 Ankylosing hyperostosis [Forestier], cervical region: Secondary | ICD-10-CM

## 2021-11-25 DIAGNOSIS — R1312 Dysphagia, oropharyngeal phase: Secondary | ICD-10-CM

## 2021-11-25 NOTE — Unmapped (Signed)
Physical Therapy Initial Evaluation and Plan of Care  Name: Matthew [name]     Date of Birth: [birth year]      MRN: [id number]    Date of evaluation: 11/25/2021  Referring Physician: Hurshel Keys, MD  [address]  Neurology  [place],  [place] [address] Phone: [phone] Fax: [phone]  Specific Order: Evaluate and treat   Date of Onset: 10/19/2021  Primary Medical Diagnosis: Z47.89 (ICD-10-CM) - Encounter for other orthopedic aftercare M48.12 (ICD-10-CM) - Ankylosing hyperostosis (forestier), cervical region Z98.1 (ICD-10-CM) - Arthrodesis status M25.511 (ICD-10-CM) - Pain in right shoulder M25.512 (ICD-10-CM) - Pain in left shoulder  Insurance: Payor: MEDICARE / Plan: MEDICARE A AND B / Product Type: Medicare /     Subjective/History:   History of Current Problem/Reason for Referral: Matthew [name] is a 76 y.o. male who presents today with chief complaint of gait deficits and weakness s/p SCI (post-surgical, bone spur at C3-4, nerve compression). Pt arrives to session ambulatory with RW with wife, [name], present. Pt has been home for IPR x 1 week. Pt reports things are going fairly well. Pt reports he's been very rigorous about continuing to exercise (has been walking laps in corridor at apartment, performing resistance band exercises, mini squats). Pt initially needed supervision for mobility but is now ambulating with RW mod-I. Only walking without device 2-3 steps. Reports feeling like L knee wants to give out. Has history of bad left knee. Reports it's bone on bone and does not have an ACL. Endorses history of neuropathy prior to surgery. Reports balance was deteriorating over the past 1-2 years - was going to a balance center before the surgery. Reports bladder function has returned to normal; bowel is not as regular as before. Endorses some lightheadedness when transitioning positions (sitting EOB, standing). Cymbalta and Lyrica are new medications.     Of note, pt is moving to [place]  the week after Thanksgiving.     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: rolling walker   Prior Treatment: IPR x 10 days   Prior Level of Function: Worked out everyday (stationary bike, [employer] training), plays tennis  Current Level of Function: Ambulatory with RW, mod-I; has not returned to gym  Fall History: None  Patient Goals for PT: My goal is to be the person I was before all this and I want to play tennis again  Precautions/Contraindications: Cervical surgery, no bracing     PMH/PSH:   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    Environmental allergies     Hearing loss     left (since age 68), right more recent    High cholesterol     Hypertension     Neck symptom     Trouble turning head to the left     Sleep apnea     Sleep disorder    ,   Past Surgical History:   Procedure Laterality Date    ACHILLES TENDON REPAIR  01/31/2002    left     APPENDECTOMY      HERNIA REPAIR  02/01/1951    INCISION AND DRAINAGE POSTERIOR LUMBAR SPINE N/A 10/30/2021    Procedure: possible lumbar drain;  Surgeon: Tempie Hoist, MD;  Location: [facility] OR;  Service: Neurosurgery;  Laterality: N/A;    IRRIGATION AND DEBRIDEMENT SPINE  N/A 10/30/2021    Procedure: CSF leak repair, exploration of the neck;  Surgeon: Tempie Hoist, MD;  Location: [facility] OR;  Service: Neurosurgery;  Laterality: N/A;    KNEE SURGERY Left 1976    SPINE SURGERY      TONSILLECTOMY      WISDOM TOOTH EXTRACTION       Medications: He has a current medication list which includes the following prescription(s): acetaminophen, albuterol, alpha lipoic acid, amlodipine, azelastine, baclofen, bimatoprost, brimonidine, cetirizine, doxepin, finasteride, fluticasone propionate, gabapentin, melatonin, methocarbamol, naloxone, quetiapine, rosuvastatin, senna-docusate, tadalafil, and timolol.    Per HPI/referring  provider:  Admission Date: 11/09/21  Discharge Date: 11/19/21  Attending Physician: Dr. Lorne Skeens     Reason for Admission: Spinal Cord Injury     History of Present Illness:   Matthew [name] 76 y.o. 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  Post operative course complicated by delirium, dysphagia (NPO - silent aspiration), pain and insomnia. Pt presenting to IPR for SCI care. On arrival he had two pressure injuries to his L foot which were not previously seen by patient or his wife; his wife noted he has a hx of peripheral neuropathy. He had episodes of delirium at the hospital that involve him discussing court cases to himself (he is a prior Conservation officer, historic buildings) which required the initiation of Seroquel. Pain also was a limiting factor for the patient in returning to his prior functional baseline.     Throughout his admission patient made good progress with his therapies and addressing functional mobility, demonstrating the ability to ambulate >150 feet with supervision/touch assistance. Pain managed via multimodal approach. Patient continued to have sleep difficulties despite resuming Seroquel after taper was started with resolution of delirium. Per review patient does have longstanding issue with sleep but noted some improvement when on 25mg  Seroquel nightly. Held discussion about not adjusting medications at discharge and that home environment will be more amenable to better sleep. Will get PM&R follow up scheduled prior to move to [place] vs send referral to PM&R clinic in [place], patient and wife agreeable to this plan.     Physical examination:   General: well developed, well nourished, in no acute distress. Sitting  in manual wheelchair with wife.  HEENT: normocephalic, atraumatic, normal conjunctiva  Neck: supple, incision present to anterior R neck, well-healing.   Lungs: no increased work of breathing on room air.   Cardiac: extremities well-perfused.  Abdomen: non-distended  Extremities: no cyanosis, clubbing, edema  Skin (prior): Left 4th metatarsal on plantar aspect now with overlying blanching skin, heel pressure wound with callous intact   Right great toe wound remains open with well appropriate surrounding skin without drainage, purulence   Neurologic (prior):  Mental status: awake, alert; oriented to person, place, time, and situation; speech fluent, no dysarthria; answers questions appropriately; follows 100% of commands  Cranial nerves: EOMI, facial expressions symmetric, hearing intact to conversation, normal voice, shoulder elevation symmetric.  Strength:               SAb     EF        EE       WE      FF        FAb  HF       KE       DF       PF          R          4          4          4          5          5          5          5          5          5          5             L          4          4          4          5          5          5          5          5          5          5             Sensation: grossly intact to light touch to BUEs and BLEs, though has a hx of peripheral neuropathy   Psychiatric: cooperative, appropriate mood and affect     Hospital Course:   SCI Rehabilitation:   CSF leak:  -Comprehensive rehabilitation program consisting of PT and OT. Focus on ADL's, transfers, wheelchair mobility, ambulation, bowel/bladder management, and family training. Will monitor patient's progress.   -Spinal precautions: none  -Fall precautions, bed alarm, chair alarm  -Skin precautions, turn q2h, low air loss mattress  - augmentin 500-125 mg qd until 10/11  -10/17: Patient discussed at weekly interdisciplinary team meeting. Making good progress functionally and likely ready for discharge within a  week.     Spasticity Management:   - none at this time, continued to monitor for evolving tone      Pain management: stable  - gabapentin 200/200/400 mg --> at discharge discussed that Lyrica may be an appropriate medication to transition to in order to see if improved pain control plus some sleep help due to RLS vs neuropathy at night; will discuss at PM&R follow up due to difficulty with medication monitoring at time of discharge  - robaxin 750 mg q6h prn  - oxy 2.5/5 mg q4h prn   - tylenol 650 mg q6h   - diclofenac qid for L shoulder     Delirium- resolved  Insomnia- Improving  -Maximized sleep wake cycle   -melatonin 9mg  QHS for circadian rhythm normalization  -seroquel 25mg  QHS for sleep/delirium --> 10/17 decreased to 12.5mg  QHS with PRN available-->increased back to 25 mg at patient request 10/18, 10/19 stable continue current dosing and discharged on this medication; discussed that would be appropriate to follow up with PCP or sleep medicine provider to discuss other medication options to help with sleep difficulties  -trazodone was d/c at [facility] and exchanged for seroquel   -Encouraged mobilization during the day.    -Limited sedating medications during the day     Neurogenic bladder:    -Goal to maintain bladder volumes <  , started with bladder scans to check for retention   -Continued finasteride 5 mg qd for BPH     Neurogenic bowel:    -Daily bowel program to ensure adequate bowel movements and prevent fecal incontinence.       Pressure Injuries: improving, wound over R 1st metatarsal nearly healed  -stage 1 pressure wound (about 2 x 1 cm) present to the ball of his L foot, unstageable (about 1 x 1 cm) wound to L heel, stage 2 on dorsum of the R great toe (x2)  -foam bandages order updated, consider PRAFO     At risk for Orthostatic hypotension:  -Abdominal binder and compression stockings  Objective:   Vital Signs: BP 96/62 mmHg HR 71  bpm Other: Standing: 87/60/78  Mental Status: alert, appears  stated age, and cooperative  Learning Assessment:   [x]  Patient is able to communicate with therapist and verbalize understanding of directions/instructions   []  Patient has difficulty effectively communicating with therapist and/or verbalize understanding of directions/instructions due to the following barriers to learning:   []   Reading  [] Language  [] Visual  [] Hearing  []  Other:  Is an interpreter required? [] Yes [x]  No  Primary Language:  Vision: glasses    Pain:  Location: Neuropathic pain; primarily anterior legs   Quality: radiating and burning    Relieved By:  Lyrica    Sensation:  impaired ; neuropathy in BLE prior to surgery    Skin integrity: Anterior cervical incision with stitches present (removing November 2); healing appropriately; history of wounds on R foot   Muscle Tone and Modified [place]: Endorses restless legs s/p surgery;     LE Strength: within functional limits or tested as follows:  Hip flexion:  Left: 4+/5 Right: 4+/5  Hip abduction:  Left: 4/5 Right: 4+/5  Hip extension:  Left: 3+/5 Right: 3+/5  Knee extension: Left: 5/5 Right: 5/5  Knee flexion:  Left: 5/5 Right: 5/5  Ankle dorsiflexion: Left: 5/5 Right: 5/5  Ankle plantarflexion: Left: 4/5 Right: 5/5  Ankle eversion: Left: 5/5 Right: 5/5  Ankle inversion: Left: 5/5 Right: 5/5  LE Range of Motion: within functional limits or tested as follows:    Flexibility: Tight HS and hip flexors bilaterally   Back Range of Motion:  full range of motion     Neck Range of Motion diminished range with pain; soreness s/p surgery     Posture: Forward head, increased thoracic kyphosis     Transfers: Mod-I with RW; SPV without AD  Bed Mobility: Independent  Wheelchair Mobility: NA    Balance:   - Sitting WFL  - Standing impaired ; static and dynamic   Coordination:  no dysmetria or tremor noted    Gait: Mod-I with RW, flexed posture; CGA-minA without assistive device, rigid posture, short step length, decreased foot clearance LLE, no arm swing, limited  trunk rotation   Stairs: Reciprocal, bilateral rails, supervision, slow speed when descending     Specialized Tests:       11/25/2021   FIVE TIME SIT TO STAND TEST   Time (s) 13.25   UE assist (Y/N) N   Level of assistance Supervision   Assistive device None         11/25/2021   TIMED UP AND GO TEST   Time (s) 17.1    10.81   UE Assist (Y/N) Yes    Yes   Level of assistance Contact guard assist    Modified independence   Assistive device None  RW         11/25/2021   SIX MINUTE WALK TEST   Distance (ft.) 1032   Level of Assistance Minimum assistance   Assistive device None  BP after: 110/69/81     Assessment:   Assessment/Problem List: Patient presents with LE weakness, proprioceptive deficits, imbalance, gait deficits, and postural faults contributing to functional limitations including decreased independence and safety with functional mobility. Pt presents with proximal hip weakness and overall tight hip flexors and hamstrings. Pt presents with overall spinal rigidity and abnormal posture. Pt is currently mod-I with a RW but requires CGA-minA for ambulation without device, with deficits as listed above. Pt performed TUG in 17 secs with time >11 secs increasing fall risk. Distance on 6MWT is below age matched norms, especially given pt's active PLOF. Patient would benefit from skilled PT to address the aforementioned deficits. Patient/family received education on the purpose of therapy, participated in the development of the POC and verbalized understanding and agreement of POC, goals.    Rehabilitation Potential: Based on this therapist's assessment, Derald MacleodRobert Mungia has the following rehabilitation potential for the PT goals stated below: : good   Positive prognostic factors include: motivation, family support, accessible home, active and independent PLOF. Potential barriers to goal achievement include: age, severity of injury, PMH, fall risk.    Evaluation Code Matrix  History:  Number of personal factors  and comorbidities (includes relevant medical complications, complicating behaviors/beliefs, communication issues, mentation, etc):  [] 0  97161        [] 1-2 97162               [x] >=3 [id number]   Examination Elements: includes number of activity/ participation limitations, affected body structure (s), and affected body functions (example, ROM, strength, coordination, tone )  [] 1-2 elements 97161             [] 3 elements 97162        [x] =>4 elements 97163   Clinical Presentation  []  97161 Stable (unchanging, uncomplicated, predicted rate of recovery)    []  97162 Evolving (changing clinical characteristics (improving/regressing,)   [x] 97163 Unstable (unpredictable characteristics,(fluctuating pain, tone, function, BP response, etc)   Evaluation Code ( select the lowest code issued on the above components)  []  Low Complexity 97161  []  Moderate Complexity 97162      [x]  High Complexity 97163         Plan Of Care:      Short Term Goals: to be met by 2 weeks  Initial status (11/25/2021)   Pt will be compliant and independent with HEP in order to maintain progress made in physical therapy.  To be initiated     Pt will ambulate household distances without AD and no more than supervision in order to access home environment more independently.  Mod-I with RW; CGA-minA without AD         LongTerm Goals: to be met by time of discharge Initial status (11/25/2021)   Pt will improve LE MMT to 5/5 in order to improve strength needed for functional tasks, body mechanics, and overall symptoms.  Hip flexion: Left: 4+/5 Right: 4+/5  Hip abduction: Left: 4/5 Right: 4+/5  Hip extension: Left: 3+/5 Right: 3+/5   Pt will perform Timed Up and Go in <12 secs, without AD and with independence, in order to demonstrate decreased fall risk and appropriate safety and independence with functional mobility.  10.81 with mod-I and RW  17.1 without AD, CGA   Pt will improve distance on 6  Minute Walk Test by at least 164 ft, ambulating without AD and no  more than SPV, in order to achieve MCID/MDC, improve endurance, and improve community mobility.  1032' without AD, minA   Pt will participate in one standardized balance/gait measure in order to assess fall risk and community mobility. Goal to be made accordingly.  To be initiated       Plan:    Treatment to Include: Therapeutic Exercise: 97110, Therapeutic Activities: 97530, Neuromuscular Re-education: [phone], Electrical Stim/unattended [facility]): [id number], Gait Training: 91478, Manual Therapy: 97140, and PT High Complexity Eval: [id number]     Patient/Family Agree to Treatment: Yes      Treatment Diagnosis:   1. Ankylosing hyperostosis (forestier), cervical region  AMB Follow-up to Physical Therapy      2. Encounter for other orthopedic aftercare  AMB Follow-up to Physical Therapy      3. Impaired functional mobility, balance, gait, and endurance  AMB Follow-up to Physical Therapy      4. Muscle weakness  AMB Follow-up to Physical Therapy            Therapy Frequency/Duration: Patient to receive skilled PT services up to 2 times per week for up to 6 weeks or up to 12 visits starting from the first scheduled follow up visit within this plan of care.      Certification Period: 11/25/2021 - 02/23/2021      Therapist Signature: Loraine Maple, PT, DPT, NCS [phone]   Date: 11/25/2021    Physician Certification   I certify that the above patient is under my care an requires the above services. These professional services are to be provided from an established plan, related to the diagnosis and reviewed by me every 90 days.     Additional comments/revisions:     Physician Name: Hurshel Keys, MD    Signature_______________________________________ Date: _______

## 2021-11-25 NOTE — Unmapped (Signed)
Occupational Therapy Initial Evaluation    Name: Matthew [name]  Date of Birth: 1945-12-20  MRN: [id number]    Date of evaluation: 11/25/2021    Referring Physician: Hurshel Keys, MD  [address]  Neurology  [place],  [place] [address] Phone: [phone]  Fax: [phone]  Specific Order: Evaluate and treat     Primary Medical Diagnosis: No diagnosis found.    Insurance: Payor: MEDICARE / Plan: MEDICARE A AND B / Product Type: Medicare /   Radio broadcast assistant Necessary:  []  yes; EVAL ONLY    []  no     11/23/2021/TDC IV/SC/[phone]/BENEFIT AUTH INFO   PT,OT, ST -PLAN: MEDICARE PRIMARY  DRAKE IS IN NETWORK  PT & ST GET $2230 COMBINED PCY USED- $1266.95 POSSIBLE 12 VISITS REMAINING  OT GETS $2230 PCY USED - $75.57 POSSIBLE 26 VISITS REMAINING  CO-INS : 80%     *ONCE PATIENT REACHES THERAPY CAP IF IT'S MED NEC TO CONTINUE   THERAPIST MUST NOTIFY IV TEAM TO ADD KX MODIFIERS AND # OF VISITS.*   IF TREATMENT IS NOT MEDICALLY NECESSARY AND PATIENT WANTS TO CONTINUE TREATMENT THEY MUST SIGN AN ABN     PT OT SP-  PATIENT HAS AARP SUPPLEMENT INS SECONDARY  ONLY PAYS IF MEDICARE PAYS  ACTIVE PER RTE  EFF:10-03-13     *DRY NEEDLING CODES 24401 AND 20561 IS NOT COVERED   IF SEEN PATIENT WILL SIGN ABN AND COST $24.09    Occupational Profile and History  Date of Onset: September 2023  History and Reason for Referral:: Matthew [name] is a 76 y.o. 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 Pt with PMHx including cervical dystonia, cerebellar ataxia, and recent C3-4 ACDF coomplicated by CSF leak with lumbar drain placement on 10/29/21. Pt discharged from hospital to I[facility] and has been home for ~2 weeks per pt/spouse report. At baseline pt stating IND with all I/ADLs and mobility without AD. He enjoyed regular exercises and playing tennis. Currently pt is using RW for functional mobility and is completing ADLs  at a seated vs standing level due to LE fatigue. 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 Pt would benefit from OP OT at this time to address the above mentioned deficits to increase function and improve upon quality of life.      Referring MD Notes: Matthew [name] 76 y.o. 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  Post operative course complicated by delirium, dysphagia (NPO - silent aspiration), pain and insomnia. Pt presenting to IPR for SCI care. On arrival he had two pressure injuries to his L foot which were not previously seen by patient or his wife; his wife noted he has a hx of peripheral neuropathy. He had episodes of delirium at the hospital that involve him discussing court cases to himself (he is a prior Conservation officer, historic buildings) which required the initiation of Seroquel. Pain also was a limiting factor for the patient in returning to his prior functional baseline.     Throughout his admission patient made good progress with his therapies and addressing  functional mobility, demonstrating the ability to ambulate >150 feet with supervision/touch assistance. Pain managed via multimodal approach. Patient continued to have sleep difficulties despite resuming Seroquel after taper was started with resolution of delirium. Per review patient does have longstanding issue with sleep but noted some improvement when on 25mg  Seroquel nightly. Held discussion about not adjusting medications at discharge and that home environment will be more amenable to better sleep. Will get  PM&R follow up scheduled prior to move to [place] vs send referral to PM&R clinic in [place], patient and wife agreeable to this plan.     PMH/PSH:   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    Environmental allergies     Hearing loss     left (since age 41), right more recent    High cholesterol     Hypertension     Neck symptom     Trouble turning head to the left     Sleep apnea     Sleep disorder    ,   Past Surgical History:   Procedure Laterality Date    ACHILLES TENDON REPAIR  01/31/2002    left     APPENDECTOMY      HERNIA REPAIR  02/01/1951    INCISION AND DRAINAGE POSTERIOR LUMBAR SPINE N/A 10/30/2021    Procedure: possible lumbar drain;  Surgeon: Matthew Hoist, MD;  Location: [facility] OR;  Service: Neurosurgery;  Laterality: N/A;    IRRIGATION AND DEBRIDEMENT SPINE N/A 10/30/2021    Procedure: CSF leak repair, exploration of the neck;  Surgeon: Matthew Hoist, MD;  Location: [facility] OR;  Service: Neurosurgery;  Laterality: N/A;    KNEE SURGERY Left 1976    SPINE SURGERY      TONSILLECTOMY      WISDOM TOOTH EXTRACTION         Medications: He has a current medication list which includes the following prescription(s): acetaminophen, albuterol, alpha lipoic acid, amlodipine, azelastine, baclofen, bimatoprost, brimonidine, cetirizine, doxepin, finasteride, fluticasone propionate, gabapentin, melatonin, methocarbamol, naloxone, quetiapine, rosuvastatin, senna-docusate, tadalafil, and timolol.    Precautions/Contraindications: Cervical surgery, no bracing    Pain: Patient reported 0/10.     Environment   Physical:   Home / Living Situation:    Patient lives in a/an:  5th floor apartment   Entrance into the home: patient has no Higher education careers adviser devices: patient uses BSC, grab bars, and walker.Has shower bench without back, Does not currently use BSC Further assessment of AD is recommended.   Bathroom set up: patient has a walk in shower.       Social:   Patient lives  with: a spouse  Support: yes  Involvement: yes         Prior and Current Level of Function: Information obtained by report and functional assessment     Prior level of function Current level of function Comments   ADLs      Feeding Independent (patient performs without assistance or equipment) Independent (patient performs without assistance or equipment)    Bathing Independent (patient performs without assistance or equipment) Modified Independent (requires use of equipment or extra time to complete task, but no assistance or supervision from caregiver)    Dressing Independent (patient performs without assistance or equipment) Modified Independent (requires use of equipment or extra time to complete task, but no assistance or supervision from caregiver) Reports it takes 5 mins for shoe lace tying,  Sits for LB dressing    Difficulty with button/zipper management   Grooming Independent (patient performs without assistance or equipment) Modified Independent (requires use of equipment or extra time to complete task, but no assistance or supervision from caregiver) Completes while seated due to LE fatigue    Difficulty with flossing has tried tooth picks   Toileting Independent (patient performs without assistance or equipment) Independent (patient performs without assistance or equipment)    IADLs      Meal prep Independent (patient performs without assistance or equipment) Total Assist (patient performs <24% of the task or requires more than one person to assist) Has not attempted since discharge- would like to address    Has concerns regarding item transport/retrieval   Housekeeping Independent (patient performs without assistance or equipment) Total Assist (patient performs <24% of the task or requires more than one person to assist) Has not attempted since being back home, likes to wash dishes   Laundry Independent (patient performs without assistance or equipment) Total Assist (patient performs <24% of the task  or requires more than one person to assist) Wife completes at baseline   Finances Independent (patient performs without assistance or equipment) Independent (patient performs without assistance or equipment)    Shopping  Independent (patient performs without assistance or equipment) Supervision/set-up (requires set-up of equipment or supervision from caregiver, but no physical contact or assist) Was primary grocercy, reports able to tolerate 15-20 mins   Medication management  Independent (patient performs without assistance or equipment) Total Assist (patient performs <24% of the task or requires more than one person to assist) Wife has been completing, spouse has to grind up pills   Technology (phone/ computer/ tablet) Independent (patient performs without assistance or equipment) Modified Independent (requires use of equipment or extra time to complete task, but no assistance or supervision from caregiver) Uses index finger for hunt/pick method during computer usage    Endorses increased frustration with phone management due to neuropathy. Decreased accuracy, increased time for texting/navigating phone   Handwriting  Independent (patient performs without assistance or equipment) Independent (patient performs without assistance or equipment) Denies concerns   Sleep       Recreation/ Leisure Independent (patient performs without assistance or equipment) Modified Independent (requires use of equipment or extra time to complete task, but no assistance or supervision from caregiver) Likes to be on computer, shopping, reads (has noticed decreased concentration, has gotten better)    Loved to be tennis, exercises   Transfers      Toilet Independent (patient performs without assistance or equipment) Independent (patient performs without assistance or equipment) Uses toilet seat to push from for slight leverage   , denies LOB with sit<>stands   Therapist, sports (patient performs without assistance or equipment) Modified  Independent (requires use of equipment or extra time to complete task, but no assistance or supervision from caregiver) Stands 50% of shower, limited by LE fatigue   Bed Mobility  Independent (patient performs without assistance or equipment) Independent (patient performs without assistance or equipment) Adjustable bed, denies concerns   Work Independent (patient performs without assistance or equipment) Retired Retired Therapist, 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    Spouse has been providing all transportation   Mental Health  Upper Extremity Assessment  Hand dominance: right  Subluxation present? No    Edema:   No edema    Muscle Tone:   Affected Upper Extremity: bilateral  normal   Modified Ashworth Scale (MAS) Bohannon and Smith, 1987   0, No increase in tone    Range of Motion:   PROM: WFL   Contractures: present        AROM:     Shoulder: RIGHT LEFT   Flexion full range  full range    Extension full range  full range    Abduction full range  full range    Adduction full range  full range    Internal Rotation (90 deg) full range  full range    External Rotation (90 deg) full range  full range        Elbow: RIGHT LEFT   Flexion full range  full range    Extension full range  full range        Forearm: RIGHT LEFT   Supination full range  full range    Pronation full range  full range        Wrist: RIGHT LEFT   Flexion full range  full range    Extension full range  full range        Hand: RIGHT LEFT   Flexion full range  full range    Extension full range  full range          Strength:   RIGHT LEFT   Shoulder 5/5: normal (full range of motion against maximal resistance) 5/5: normal (full range of motion against maximal resistance)   Elbow 5/5: normal (full range of motion against maximal resistance) 5/5:  normal (full range of motion against maximal resistance)   Wrist  5/5: normal (full range of motion against maximal resistance) 5/5: normal (full range of motion against maximal resistance)     UE Orthotics / Splints:  []  yes  [x]  no              FUEL Level: The Functional Upper Extremity Levels (FUEL) is a new classification tool to assess a person's upper-extremity functional and physical performance after sustaining a stroke.   Fully functional: involved UE has returned to complete function. Able to use as dominant UE if premorbidly dominant. Strength, grasp, pinch, and coordination measurements are all normal.         Sensation:       Right Left   Light Touch impaired impaired   Hot/Cold intact intact   Proprioception intact intact       Balance/ Postural Control:    Sitting: WFL   Standing: WFL    Functional mobility: Currently using RW within the home and for community level distances. Pt reporting PT recently recommended transitioning away from RW. Is concerned able LE endurance    Objective Findings and Measurements     Dynamometer Gross Grasp is a quantitative and objective measure of isometric muscular strength of the hand and forearm. This tool can be used as a pre-post measuring assessment.  Position the middle bones of your fingers so that they rest on the forward end of the dynamometer grip - the part you'll be gripping.   Hold your arm at the side of your body with your hand forward, elbow at a 90 degree angle, elbow about 2 inches away from your body.  Do a couple 'practice' grips. Get a feel for gripping the dynamometer.   Tense your hand as  hard as possible for a few (3 - 5) seconds.   Don't move any other parts of your body, don't help with other muscles, etc.   Do 3 trials, waiting about a minute to recover between each one. Take average of 3 trials.    Right - 64 lbs vs norm of 75+ years: 65.7 lbs  65 65 63  Left - 62 lbs vs norm of 75+ years: 55.0 lbs 59 64 65    Nine Hole Peg Test    Instructions:   1. Board should be placed in front on pt at his/her midline, with the container holding the pegs oriented towards the hand bring tested  2. Take pegs from a container, one by one, and place them into the holes on the board (as quickly as possible)  3. Remove pegs, one by one, and place back in container  4. Practice round first for each hand; then dominant hand, followed by non-dominant hand.  A. Pick up the pegs one at a time, using your right (or left) hand only and put them into the hole sin any order until the holes are filled. Then remove the pegs one at a time and return them to the container. Stabilize the peg board with your left (or right) hand. This is a practice test. See how fast you can put all the pegs in and take them out again. Are you ready? Go!  B. After the pt performed the practice trial, This will be the actual test. The instructions are the same. Work as quickly as you can. Are you ready? Go! While the patient is performing the test say Faster. When the pt places the last peg on the board, instruct the pt  out again...faster  5. Hand not being evaluated can hold the edge of the board for stability  6. If a peg drops on the table, pt can retrieve it; if drops on ground, therapist should retrieve for the pt  7. Scores: time taken to complete (seconds). Can also score by the number of pegs placed in 50 or 100 seconds (=pegs/second)  8. Start time once the pt touches the first peg, stop timer once the last peg hits the counter  Minimum Detectable Change (specifically for stroke population)= 32.8 seconds     Right 47 sec. vs norm 71+ years: average 25.79 seconds  Left 43 sec. vs norm 71+ years: average 25.95 seconds      Cognition Status:   [x]  functional for basic conversation    []  impaired attention    []  impaired memory   []  impaired awareness/impaired insight    []  unable to assess due to communication deficits    [x]  further assessment needed within context of  functional and therapeutic interventions     Visual Perception:    []  N/T  []  WFL   [x]  Impaired   []  Absent  [x]  Wears glasses   []  Visual field deficit    []  diplopia    []  other    Learning Needs Assessment:  [x]  Patient is able to communicate with therapist and verbalize understanding of directions/instructions  []  Patient is unable to communicate with therapist and/or verbalize understanding of directions/understanding due to the following barriers to learning:      []  Reading     []  Language     []  Visual     []  Hearing     []  Other:  Is an interpreter required? [] Yes   [x]  No  Assessment/Problem List: Patient presents as outlined below . Patient would benefit from skilled OT to address the indicated deficits.     Evaluation Code Matrix  History:    Number of personal factors and comorbidities (includes relevant medical complications, complicating behaviors/beliefs, communication issues, mentation, etc):    [] 0      97165       [x] 1-2     97166          [] >=3      97167     Performance Deficits:   decreased ambulation/ability to do stairs  dependent with HEP  decreased balance/ coordinationUE function, ADLs, balance, functional mobility, and activity tolerance    [] 1-2 elements 97165            [x] 3 elements 97166        [] >4 elements 97167      Clinical Presentation/Comorbidities  [x]  97165 Stable (no comorbidities)    []  97166 Evolving (Clinical decision making is of moderate analytic complexity; has comorbidities)   []  97167 Unstable (Clinical decision making is of high analytic complexity; has comorbities)   Evaluation Code  []  Low Complexity 97165  [x]  Moderate Complexity 97166      []  High Complexity 97167            Rehabilitation Potential: Based upon this therapist's assessment, Matthew [name] has the following rehabilitation potential for OT goals stated in this evaluation: good    Occupational Therapy Plan of Care:  I/ADL retriaing, functional endurance training required for household  distance, [facility] training. 2x per week for 4 weeks   Patient/Family Goals: I want to be able to return to tennis and driving    Therapy Goals:     Short Term Goals to be met within 4 sessions INITIAL STATUS REASSESSMENT:  DATE:   1. Patient will demonstrate independence with progressive HEP for [facility] and energy conservation strategies to address functional deficits with continued progress after discharge.     Initiated hand coordination HEP during intitial eval, would benefit from reinforcement []  PROGRESS MADE/        CONTINUE GOAL  []  GOAL MET  []  DC GOAL   Long Term Goals to be met within 8 sessions:  INITIAL STATUS REASSESSMENT:   Pt will be educated on AE for dressing to maximize IND and improve QOL.   Initiated education on elastic shoe laces and button hook at initial eval [x]  PROGRESS MADE/        CONTINUE GOAL  []  GOAL MET  []  DC GOAL     2. Pt will tolerate >20 mins of functional mobility while completing simple IADL task to demonstrate improve endurance  Reports fatigue, per patient report is transitioning out of RW []  PROGRESS MADE/        CONTINUE GOAL  []  GOAL MET  []  DC GOAL     3. Patient will decrease time on 9 hole peg test by at least 5 seconds with the B UE in order to increase independence with clothing management.     Right hand: 47 seconds    Left hand: 43 seconds []  PROGRESS MADE/        CONTINUE GOAL  []  GOAL MET  []  DC GOAL     4. Pt will be educated on accessibility features to improve usage. Limited by neuropathy, states texting is very frustrating, decreased accuracy   []  PROGRESS MADE/        CONTINUE GOAL  []  GOAL MET  []  DC GOAL  5. Patient will complete cognitive and visual perceptual assessments in order to further assess safety with community mobility.     Is moving, would like to complete screening processes for returned to driving prior to upcoming move at end of november []  PROGRESS MADE/        CONTINUE GOAL  []  GOAL MET  []  DC GOAL     Plan:    Treatment to Include:  therapeutic exercise, therapeutic activity, moist heat/cold pack, manual therapy, self-care/home management, and Estim/FC/TENS  Patient/Family Agree to Treatment: Yes     Treatment Diagnosis: No diagnosis found.    Therapy Frequency/Duration: Patient to receive skilled OT services 2 times per week for 4 weeks starting from the first scheduled follow up visit, or up to a total of 8 visits within this POC.   Certification Period: 11/25/2021 - 90 days    []  SKILLED TREATMENT PROVIDED THIS SESSION:     CPT code Skilled interventions, Assessment, and Education Timed code treatment minutes   therapeutic activity Education on standardized assessments and how they relate to ADL/IADL tasks.     Engaged pt in the following hand coordination exercises with emphasis on precision skills, isolated finger dexterity, dynamic stability and strengthening:  -digit<>palm translation with coin  - pincer grasp+shifting using paper towel  -complex rotation using pen  -digital opposition  -pincer grasp with simple rotation  - pincer grasp using tweezer to pick up paper clips  -bilateral pincer grasp/lateral pinch to make paper clip chain.  Note: Pt  demonstrated understanding of all exercises with clinician providing supervision and verbal cues for technique. Instructed pt to complete 2-3x per day to further address hand coordination outside of therapy sessions. Pt verbalized understanding. Handout provided for carryover.    20 min   self-care/home management Pt educated on use of the following AE at evaluation:  -elastic shoe laces  -button hook/zipper pull  Note: Pt reporting ~5 mins to tie shoes. Was very appreciative of education provided on use of elastic shoe laces. Pt was able to trial use of button hook during session. States improved ability/less frustration with use of AE. Pt's spouse present during session and was able to locate items on mobile device for private purchase via [facility].   10 min   therapeutic exercise   min         Start: 1331  Stop:  1431 30 min mod complexity evaluation  Total treatment time: 74        Education provided this date: Patient/family received education on the role and  purpose of therapy, participated in the development of the POC and verbalized and understanding and agreement of POC including goals and interventions. Reviewed scheduling for appointments.        Education to: [x]  Patient [x]  Significant other  Mordecai Maes, wife) []  Caregiver []  other   Method: [x]  Explanation [x]  Demonstration   [x]  Handouts []  other   Response: [x]  Verbalized understanding []  Demonstrated understanding []  Needs reinforcement []  other     Timed Code Treatment Minutes: 30 minutes  Total Treatment Time: 60 minutes      Therapist Signature: Antonieta Iba, OTR/L  Date: 11/25/2021      Physical Certification:  I certify that the above patient is under my care and requires the above services.  These professional services are to be provided from an established plan related to the diagnosis and reviewed by me every 90 days.      Physician's Name:  ____________________________  19 Signature:  __________________________ Date:   ___________

## 2021-11-25 NOTE — Unmapped (Signed)
OUTPATIENT SPEECH LANGUAGE PATHOLOGY EVALUATION    Patient:  Matthew [name] DOB:  02/18/1945 MRN:  [id number]    Date: 11/25/2021 Onset Date:  10/19/2021 first surgery with revision on  10/29/2021 Prior Therapy:  prior speech therapy in IP rehab at [facility] and acute, multiple instrumental swallow studies.      Medical/Rehab Diagnosis: 76 y/o male s/p C3-4 ACDF on 9/19 complicated by intraoperative CSF leak presents with progressive R neck swelling concerning for continued CSF leak. 9/30: underwent CSF leak repair and neck exploration.     Referring Physician: Hurshel Keys, MD  [address]  Neurology  [place],  [place] [address] Phone: [phone]  Fax: [phone]     Insurance: Payor: MEDICARE / Plan: MEDICARE A AND B / Product Type: Medicare /     11/23/2021/TDC [id number] AUTH INFO   PT,OT, ST -PLAN: MEDICARE PRIMARY  DRAKE IS IN NETWORK  PT & ST GET $2230 COMBINED PCY USED- $1266.95 POSSIBLE 12 VISITS REMAINING  OT GETS $2230 PCY USED - $75.57 POSSIBLE 26 VISITS REMAINING  CO-INS : 80%    PMH: Reviewed past medical history in the chart.   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    Environmental allergies     Hearing loss     left (since age 76), right more recent    High cholesterol     Hypertension     Neck symptom     Trouble turning head to the left     Sleep apnea     Sleep disorder       Past Surgical History:   Procedure Laterality Date    ACHILLES TENDON REPAIR  01/31/2002    left     APPENDECTOMY      HERNIA REPAIR  02/01/1951    INCISION AND DRAINAGE POSTERIOR LUMBAR SPINE N/A 10/30/2021    Procedure: possible lumbar drain;  Surgeon: Matthew Hoist, MD;  Location: [facility] OR;  Service: Neurosurgery;  Laterality: N/A;    IRRIGATION AND DEBRIDEMENT SPINE N/A 10/30/2021    Procedure: CSF leak repair, exploration of the neck;  Surgeon: Matthew Hoist, MD;  Location: [facility] OR;  Service: Neurosurgery;  Laterality: N/A;    KNEE SURGERY Left 1976     SPINE SURGERY      TONSILLECTOMY      WISDOM TOOTH EXTRACTION           PLF:Pt arrived with his wife, Matthew [name] today.  Pt also lives at home with their 50 year old son.  Pt is a retired Animator.      Current Complaint: Swallowing problems since his second surgery.  Reports he is currently eating a Dysphagia 2/thin diet.  Crushing majority of medications with puree items.  Reports that he feels the level 2 diet is going down ok. Reports the process is slow due to the extra chewing.  Strategies he is using is supraglottic swallow with all foods/drinks.   Reports his voice is generally back to normal but with some slurred words.  Reports he had delirium during the hospital stay, but that he feels his cognition is back to normal.    Patient's current satisfaction of swallowing  6/10.    General Information  Vision glasses   Hearing right hearing aid, deaf in left ear   Hand Dominance Right   Dentition Natural   Pain (Location/Severity/Intervention) reports pain at 2/10 in in left shoulder   Assessment: Pt  interview; bedside swallow evaluation     Bedside swallow summary  Patient presents with known oropharyngeal dysphagia per prior instrumental assessments with last FEES completed on 10/19 with recommendation of D2/thin diet with use of supraglottic swallow.          MOTOR FUNCTION Strength / Coordination / Symmetry Cranial Nerve Innervation (motor)   Facial Expression   CN VII           Smile/Frown WFL           Pucker WFL           Protrude lower lip dnt           Puff cheeks                     w/resistance (pinch) WFL    Muscles of Mastication   CN V           Clench teeth temporalis/masseter dnt          Open/ Close mouth               (resistance) dnt           Move jaw side to side  (resistance) dnt    Lingual  CN XII           Protrude WFL, mild decreased strength           Lateralize WFL, mild decreased strength           Elevate WFL           Depress WFL    Velum  CN IX/ CN X           ahhhh  (elevation) WFL            VOICE Comment / Time  Cranial Nerve Innervation   Vocal quality WNL's, CN X         TRIALS: (consistency, oral preparation, oral transit, pharyngeal phase/hyolaryngeal elevation/excursion, response to possible aspiration/penetration)  Dysphagia Trials    Consistency Number of Trials Response   Thin, by water bottle  8 drinks Multiple swallows on occasion, elevation appears WFLs and timing appears WFLs, no overt s/s of aspiration   Nectar DNT    DI 2 tsp of applesauce Multiple swallows with large bite of applesauce   DII Nutrigrain bar, 2 bites Multiple swallows with bite   Regular DNT        Recommendations: Frequency to be determined upon MBS and cognitive testing.       Summary of Results: pt with known oropharyngeal dysphagia per instrumental swallow assessment.  Pt with past h/o delirium.     Functional Deficits:       Oropharyngeal Stage Dysphagia--  moderate      Strategies:  modification of Bolus Size and supraglottic swallow    Medications reviewed in Epic on 11/25/2021    Method for taking po medication:  Whole with thin liquid    Instrumental Dysphagia Exam:  Yes:  MSB/FEES. and Tenatively scheduled for:  11/30/2021 at 10:15    Education Activities and Response: pt educated on results of assessment and plan for MBS and completion of formal cognitive assessment    Learning Assessment:  Matthew MaduroRobert is able to communicate with therapist and verbalize understanding of directions / instructions.  and Is an interpreter required?  No    Patient / family received education on the purpose of therapy, participated in the development of the POC and verbalized understanding and agreement of POC,  goals.  Patient/Family goal:  I want to be able to play tennis again.                                                                                                                     Potential Benefits of Treatment/Long Term Goal: improve satisfaction of swallow function in order to meet nutrition and  hydration needs to >8/10    Barriers To Progress: none    Rehab Potential:   good  and Due to:   for goals stated, pt support and motivation      Outpatient Speech Language Pathology Short-term Treatment Goals  Cognitive-Linguistic  Complete formal cognitive assessment and add goals as needed.     Dysphagia  Complete MBS and add goals as needed.     Education  Patient/caregivers will demonstrate understanding of role of speech therapy diagnosis, prognosis, home exercise programs, plan of care, recommendations, and safety precautions.     PLAN:  Functional Communication Cognitive Linguistic Therapy  MBS  Frequency: to be determined based on cognitive testing and MBS results.     Total Minutes: 44  Charge: 92610     POC Treatment Areas CPT Codes POC Assessment Areas CPT Codes   Speech, Language and VoiceTx Codes []  16109   Speech, Language and Voice Assessment Codes []  92521  []  92522  []  92523  []  92524  []  96105  []  92616  []  92511    Dysphagia Tx Codes [x]  92526   Dysphagia Assessment Codes [x]  92610  [x]  92611  []  92612   Cognition Tx Codes [x]  97129  [x]  97130   Cognition Assessment Codes [x]  96125   AAC Tx Codes []  92609   AAC Assessment Codes []  92607  []  60454   Group Tx Codes []  09811         Therapist Signature:    Jola Baptist MA CCC-SLP [phone]  Phone number [phone]  Email: [email or web address]     Date: 11/25/2021    Physician Certification:   I certify that the above patient is under my care and requires the above services.  These professional services are to be provided from an established plan, related to the diagnosis and reviewed by me every 10th visits or every 90 days, whichever occurs first.    Physician Comments/Revisions:    Physician Name (printed):   Zeldin, Floyce Stakes, MD  Physician Signature: ___________________________________

## 2021-11-25 NOTE — Unmapped (Signed)
PT DAILY TREATMENT NOTE    Diagnosis:   1. Ankylosing hyperostosis (forestier), cervical region  AMB Follow-up to Physical Therapy      2. Encounter for other orthopedic aftercare  AMB Follow-up to Physical Therapy      3. Impaired functional mobility, balance, gait, and endurance  AMB Follow-up to Physical Therapy      4. Muscle weakness  AMB Follow-up to Physical Therapy              Referring Provider:Zeldin, Floyce Stakes, MD    Insurance plan:   Payer/Plan Subscr DOB Sex Relation Sub. Ins. ID Effective Group Num   1. MEDICARE - [name], [name] 27-Jul-1945 Male Self [id number] 10/02/11                                    [address]   2. Matthew [name]* 05/15/1945 Male Self [id number] 10/01/13                                    [address]                         # of visits per insurance authorization:   PT,OT, ST -PLAN: MEDICARE PRIMARY  DRAKE IS IN NETWORK     PT OT SP-  PATIENT HAS AARP SUPPLEMENT INS SECONDARY  ONLY PAYS IF MEDICARE PAYS    # of visits per POC: Eval + 12 visits    Date of Initial Eval: 11/25/2021    Date of Last Progress Report: NA    Therapy Goals:   Short Term Goals: to be met by 2 weeks  Initial status (11/25/2021)   Pt will be compliant and independent with HEP in order to maintain progress made in physical therapy.  To be initiated     Pt will ambulate household distances without AD and no more than supervision in order to access home environment more independently.  Mod-I with RW; CGA-minA without AD         LongTerm Goals: to be met by time of discharge Initial status (11/25/2021)   Pt will improve LE MMT to 5/5 in order to improve strength needed for functional tasks, body mechanics, and overall symptoms.  Hip flexion: Left: 4+/5 Right: 4+/5  Hip abduction: Left: 4/5 Right: 4+/5  Hip extension: Left: 3+/5 Right: 3+/5   Pt will perform Timed Up and Go in <12 secs, without AD and with independence, in order to demonstrate decreased fall risk and appropriate safety and  independence with functional mobility.  10.81 with mod-I and RW  17.1 without AD, CGA   Pt will improve distance on 6 Minute Walk Test by at least 164 ft, ambulating without AD and no more than SPV, in order to achieve MCID/MDC, improve endurance, and improve community mobility.  1032' without AD, minA   Pt will participate in one standardized balance/gait measure in order to assess fall risk and community mobility. Goal to be made accordingly.  To be initiated       ______________________________________________________________________  Pain:0/10 Location: Neuropathic pain                        Precautions: Cervical surgery, fall risk    Activity Date: 11/25/2021 Date:  Date: Date:  Visit # Eval, 1      Checked for POC Certification         Verified Signed POC:    YES[]       NO[]            Date:    Rerouted to Referral Source for signature via: EMR[]   Fax[]    Mail[]       Date:  Routed to PCP (if other than referring) for signature via: EMR[]   Fax[]     Mail[]      Date:  Was POC updated?        YES[]         NO[]                            Date:        If yes, Verified Signed POC:      YES[]             NO[]            Date:      Subjective:     CPT code  Daily treatment record Timed Code Treatment Minutes     PT High Complexity Eval: 16109   See initial evaluation 48     Therapeutic Exercise: 97110   Educated on continuing to build walking endurance and aerobic conditioning with laps with RW. HEP including SL bridge for hip strengthening; handout provided. Discussed future safe gym interventions.  11     Gait Training: 60454       Response to Treatment See assessment     Total timed coded treatment minutes:  11    Total treatment time (timed + untimed):  59     Plan for Next Visit: Berg or FGA as appropriate. Gait training without device. Functional strengthening and balance HEP. Stretches as indicated.     Additional Information:     Patient Education:   11/25/2021: Pt educated on PT initial evaluation findings, PT  plan of care, and follow-up treatment. Pt verbalized understanding. Educated on continuing to build walking endurance and aerobic conditioning with laps with RW. HEP including SL bridge for hip strengthening; handout provided. Discussed future safe gym interventions.     This note serves as a discharge summary if the patient does not return to Physical Therapy per the above POC.

## 2021-11-29 ENCOUNTER — Encounter: Admit: 2021-11-29 | Discharge: 2021-11-29 | Payer: MEDICARE

## 2021-11-29 DIAGNOSIS — R413 Other amnesia: Secondary | ICD-10-CM

## 2021-11-29 DIAGNOSIS — M4812 Ankylosing hyperostosis [Forestier], cervical region: Secondary | ICD-10-CM

## 2021-11-29 DIAGNOSIS — Z789 Other specified health status: Secondary | ICD-10-CM

## 2021-11-29 NOTE — Unmapped (Signed)
OUTPATIENT SPEECH LANGUAGE PATHOLOGY  DAILY NOTE  Name:Matthew [name] DOB:Dec 02, 1945 ZOX:[id number]  Today's Date: 11/29/2021  Referring Provider:Evan Lynnea Ferrier, MD  [address]  Neurology  [place],  [place] [address]  Primary Diagnosis:76 y/o male s/p C3-4 ACDF on 9/19 complicated by intraoperative CSF leak presents with progressive R neck swelling concerning for continued CSF leak. 9/30: underwent CSF leak repair and neck exploration.   Onset Date: 10/19/2021 first surgery with revision on  10/29/2021  Diagnosis:dysphagia, memory complaints  Insurance Plan:Payor: MEDICARE / Plan: MEDICARE A AND B / Product Type: Medicare /   # visits per insurance authorization:   11/23/2021/TDC [id number]/BENEFIT AUTH INFO   PT,OT, ST -PLAN: MEDICARE PRIMARY  DRAKE IS IN NETWORK  PT & ST GET $2230 COMBINED PCY USED- $1266.95 POSSIBLE 12 VISITS REMAINING  OT GETS $2230 PCY USED - $75.57 POSSIBLE 26 VISITS REMAINING  CO-INS : 80%  Date of initial eval: 11/25/2021  # visits per POC: to be determined per evaluation results.   Date of initial POC Certification: 11/25/2021  Verified Signed POC Certification:  Yes: x  on 11/26/2021     Date of POC Recertification: n/a  Verified Signed POC Recertification:    Date of Progress Report (every 10 visits):     POC Recertification due(Medicaid 60 days/Medicare 90 days): 02/25/2022      Goals:  Goal 11/29/2021       Complete formal cognitive assessment and add goals as needed.  Completed, see results below.  Will determine need for adding goals per discussion with pt.       Complete MBS and add goals as needed.  Scheduled for 11/30/2021 at 10:30       Patient/caregivers will demonstrate understanding of role of speech therapy diagnosis, prognosis, home exercise programs, plan of care, recommendations, and safety precautions. ongoing                                         ______________________________________________________________________  Visit #: 1 Charges: 29562  Total  Minutes: 45 in person and 20 minutes scoring.  Total minutes: 65    RBANS record form a  Total Scores: Index Score:  Classification: Percentile Group: Comments:   Immediate memory: List Learning:18/40  Story Memory: 15/24 83 Low average 12+/- Hearing deficits may have impacted score, however pt reported he was able to hear this therapist just fine.    Visuospatial/constructional: Figure Copy: 20/20  Line Orientation: 14/20 92 average 28+/-    Language:   Picture Naming:  9/10  Semantic Fluency: 21/40 105 average 63+/-    Attention: Digit Span: 11/16  Coding:  42/89 103 average 55+/-    Delayed memory:  List Recall:  3/10  List Recognition: 18/20  Story Recall:   6/12  Figure Recall:  16/20 86 Low average 19+/-    **Page 76 in the manual index score classifications:   130+= very superior 120-129= superior 110-119= high average    90-109= average 80-89=low average  70-79=borderline          69 and below=extremely low   Education Addressed During This Session: discussed reasoning for completing assessment and goals as well as mbs scheduled tomorrow    Plan:  discuss results of RBANS and MBS results and add goals per discussion.     Jola Baptist MA CCC-SLP [phone]  Phone number [phone]  Email: [email or web address]  This note serves as a discharge summary if the patient does not return to Speech Therapy per the above POC.

## 2021-11-29 NOTE — Unmapped (Signed)
PT DAILY TREATMENT NOTE    Diagnosis:   1. Ankylosing hyperostosis (forestier), cervical region  AMB Follow-up to Physical Therapy      2. Encounter for other orthopedic aftercare  AMB Follow-up to Physical Therapy      3. Impaired functional mobility, balance, gait, and endurance  AMB Follow-up to Physical Therapy      4. Muscle weakness  AMB Follow-up to Physical Therapy              Referring Provider:Zeldin, Floyce Stakes, MD    Insurance plan:   Payer/Plan Subscr DOB Sex Relation Sub. Ins. ID Effective Group Num   1. MEDICARE - MECHRISTYAN, REGER* Jun 08, 1945 Male Self [id number] 10/02/11                                    [address]   2. Kristin Bruins [place]* [birth year] Male Self [id number] 10/01/13                                    [address]                         # of visits per insurance authorization:   PT,OT, ST -PLAN: MEDICARE PRIMARY  DRAKE IS IN NETWORK     PT OT SP-  PATIENT HAS AARP SUPPLEMENT INS SECONDARY  ONLY PAYS IF MEDICARE PAYS    # of visits per POC: Eval + 12 visits    Date of Initial Eval: 11/25/2021    Date of Last Progress Report: NA    Therapy Goals:   Short Term Goals: to be met by 2 weeks  Initial status (11/25/2021)   Pt will be compliant and independent with HEP in order to maintain progress made in physical therapy.  To be initiated     Pt will ambulate household distances without AD and no more than supervision in order to access home environment more independently.  Mod-I with RW; CGA-minA without AD         LongTerm Goals: to be met by time of discharge Initial status (11/25/2021)   Pt will improve LE MMT to 5/5 in order to improve strength needed for functional tasks, body mechanics, and overall symptoms.  Hip flexion: Left: 4+/5 Right: 4+/5  Hip abduction: Left: 4/5 Right: 4+/5  Hip extension: Left: 3+/5 Right: 3+/5   Pt will perform Timed Up and Go in <12 secs, without AD and with independence, in order to demonstrate decreased fall risk and appropriate safety and  independence with functional mobility.  10.81 with mod-I and RW  17.1 without AD, CGA   Pt will improve distance on 6 Minute Walk Test by at least 164 ft, ambulating without AD and no more than SPV, in order to achieve MCID/MDC, improve endurance, and improve community mobility.  1032' without AD, minA   Pt will participate in one standardized balance/gait measure in order to assess fall risk and community mobility. Goal to be made accordingly.  To be initiated       ______________________________________________________________________  Pain:0/10 Location: Neuropathic pain                        Precautions: Cervical surgery, fall risk, orthostasis     Activity Date: 11/25/2021 Date: 11/29/2021 Date:  Date:    Visit # Eval, 1 2     Checked for POC Certification  Yes       Verified Signed POC:    YES[x]       NO[]            Date:  Attestation signed by Hurshel Keys, MD at 11/25/2021  4:01 PM  Rerouted to Referral Source for signature via: EMR[]   Fax[]    Mail[]       Date:  Routed to PCP (if other than referring) for signature via: EMR[]   Fax[]     Mail[]      Date:  Was POC updated?        YES[]         NO[]                            Date:        If yes, Verified Signed POC:      YES[]             NO[]            Date:      CPT code  Daily treatment record Timed Code Treatment Minutes     Neuromuscular Re-education: 97112   Pt performed the following exercises for static and dynamic balance retraining with RW nearby for UE support as needed and CGA-minA for stability:  - Narrow stance with head turns/nods x10 ea way  - Staggered stance x 30 secs ea side  - Fwd/bwd/side step in place (PD stepping grid) to focus on weight shift and pre-gait without AD    Added to HEP with handout and discussed performing with RW, countertop, and wife nearby for safety.  13     Therapeutic Exercise: 97110   Pt transferred to Recumbent TRUE bike ambulatory with RW, able to step over seat without LOB, mod-I. Performed x 10 mins with BLE for  neural priming, aerobic conditioning, and muscular strengthening. Workload @ 4-5 with speed of >50-60 SPM. Discussed safe use of recumbent bike at home gym and how to slowly increase tolerance and time. Discussed performing as neural priming prior to walking practice at home.     Reviewed HEP and performed the following:  - SL bridge with LE crossover 2x10 ea side 18     Gait Training: 16109   Vitals at beginning of session: 107/66, HR 76 BPM        11/29/2021   Funtional Gait Assessment   Gait Level Surface Mild Impairment   Gait with Horizontal Head Turns Mild Impairment   Gait and Pivot Turn Mild Impairment   Gait with Narrow Base of Support Severe Impairment   Ambulating Backwards Mild Impairment   Change in Gait Speed Moderate Impairment   Gait with Vertical Head Turns Mild Impairment   Step Over Obstacle Moderate Impairment   Gait with Closed Eyes Severe Impairment   Steps Mild Impairment   Total Score out of 30 14      Discussed fall risk indicators with FGA score and importance of continuing to use AD.     Throughout session, pt ambulated without AD and CGA for safety.  27   Response to Treatment Pt's score of <23/30 on FGA increases risk of falls and pt still requires use of AD at this time. Challenged by all exercises with several LOB with balance tasks requiring UE support or minA to recover.      Total timed coded treatment minutes:  58    Total treatment time (timed + untimed):  58     Plan for Next Visit: Gait training without device, multi-directional stepping and gait. Functional strengthening and balance HEP. Stretches as indicated.     Additional Information:     Patient Education:   11/29/2021: Discussed fall risk indicators with FGA score and importance of continuing to use AD. Discussed safe use of recumbent bike at home gym and how to slowly increase tolerance and time. Discussed performing as neural priming prior to walking practice at home. HEP: SL bridge, narrow stance with head turns/nods,  staggered stance, fwd/bwd/side step in place.   11/25/2021: Pt educated on PT initial evaluation findings, PT plan of care, and follow-up treatment. Pt verbalized understanding. Educated on continuing to build walking endurance and aerobic conditioning with laps with RW. HEP including SL bridge for hip strengthening; handout provided. Discussed future safe gym interventions.     This note serves as a discharge summary if the patient does not return to Physical Therapy per the above POC.

## 2021-11-29 NOTE — Unmapped (Addendum)
Occupational Therapy Treatment Encounter Note  Name: Matthew [name]  Date of Birth: [birth year]  MRN: [id number]    Referring Physician: Hurshel Keys, MD  [address]  Neurology  [place],  [place] [address] Phone: [phone]  Fax: [phone]    Treatment Diagnosis: Impaired IADL's    Current Encounter History: History and Reason for Referral:: Matthew [name] is a 76 y.o. 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 Pt with PMHx including cervical dystonia, cerebellar ataxia, and recent C3-4 ACDF coomplicated by CSF leak with lumbar drain placement on 10/29/21. Pt discharged from hospital to IPR and has been home for ~2 weeks per pt/spouse report. At baseline pt stating IND with all I/ADLs and mobility without AD. He enjoyed regular exercises and playing tennis. Currently pt is using RW for functional mobility and is completing ADLs at a seated vs standing level due to LE fatigue. 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 Pt would benefit from OP OT at this time to address the above mentioned deficits to increase function and improve upon quality of life.     DATE OF INITIAL OT EVALUATION :10/26  VERIFIED SIGNED POC:  [x]  yes; DATE:      Cosigned by: Matthew Keys, MD at 11/26/2021  9:16 AM       Attestation signed by Matthew Keys, MD at 11/26/2021  9:16 AM     Electronically Signed By:  Matthew Skeens, MD  Physical Medicine and Rehabilitation  11/26/2021 9:16 AM                 Activity Date:  10/30  Date:  Date:  Date:  Date:  Date:  Date:  Date:  Date:     Visit # 2 3 4 5 6 7 8 9 10    Checked for POC Certification                     Insurance: Payor: MEDICARE / Plan: MEDICARE A AND B / Product Type: Medicare /   Therapy Goals:      Short Term Goals to be met within 4 sessions INITIAL STATUS REASSESSMENT:  DATE:   1. Patient will demonstrate independence with progressive HEP for [facility] and energy conservation strategies to address functional deficits with continued progress after discharge.     Initiated hand coordination HEP during intitial eval, would benefit from reinforcement []  PROGRESS MADE/        CONTINUE GOAL  []  GOAL MET  []  DC GOAL   Long Term Goals to be met within 8 sessions:  INITIAL STATUS REASSESSMENT:   Pt will be educated on AE for dressing to maximize IND and improve QOL.   Initiated education on elastic shoe laces and button hook at initial eval [x]  PROGRESS MADE/        CONTINUE GOAL  []  GOAL MET  []  DC GOAL     2. Pt will tolerate >20 mins of functional mobility while completing simple IADL task to demonstrate improve endurance  Reports fatigue, per patient report is transitioning out of RW []  PROGRESS MADE/        CONTINUE GOAL  []  GOAL MET  []  DC GOAL     3. Patient will decrease time on 9 hole peg test by at least  5 seconds with the B UE in order to increase independence with clothing management.     Right hand: 47 seconds    Left hand: 43 seconds []  PROGRESS MADE/        CONTINUE GOAL  []  GOAL MET  []  DC GOAL     4. Pt will be educated on accessibility features to improve usage. Limited by neuropathy, states texting is very frustrating, decreased accuracy   []  PROGRESS MADE/        CONTINUE GOAL  []  GOAL MET  []  DC GOAL     5. Patient will complete cognitive and visual perceptual assessments in order to further assess safety with community mobility.     Is moving, would like to complete screening processes for returned to driving prior to upcoming move at end of november []  PROGRESS MADE/        CONTINUE GOAL  []  GOAL MET  []  DC GOAL       Pain:0/10 Location:                            Precautions:none  Subjective:   Date: 11/29/2021 : States he is moving at end of November to [place]. Is frustrated with physical and  cognitive decline since surgeries.     Treatment Session:     CPT code SKILLED INTERVENTIONS AND EDUCATION: Timed Code Treatment Minutes:    therapeutic activity Engaged patient in the following  tasks with emphasis on increasing IADL independence:     The Montreal Cognitive Assessment  [facility]) is a rapid screening instrument for mild cognitive deficit. It assesses different cognitive domains: attention and concentration, executive functions,memory, language, visuoconstructional skills, conceptual thinking, calculations, and orientation. The total possible score is 30 points: a score of 26-30/ average 27.2 is considered normal cognition, 19.0-25.2 / average score 22.1 is mild cognitive impairment and average score of 16.2 suggests Alzheimer's disease/ moderate to severe cognitive impairment.    Score: 28/30      The Motor-Free Visual Perception Test (MVPT)     The Motor-Free Visual Perception Test (MVPT) is a widely used, standardized test of visual perception. Unlike other typical visual perception measures, this measure is meant to assess visual perception independent of motor ability. The 5 domains measured are: visual discrimination, visual figure-ground, visual memory, visual closure, and visual spatial.     Raw Score and Visual Processing Time Norms     Age Raw Score Visual Perceptual Processing Time   18-49 years  34-36 3.5-4.0 sec   50-69 years 32-36  3.0-5.4 sec   70-80 years  25-35 4.5-7.1 sec     Score:   3/3: visual discrimination   5/5: figure ground  5/5: visual discrimination   6/8: visual memory   11/11: visual closure   4/4: visual discrimination   34/36 RAW SCORE    Pt presents with difficulties in:   Visual Memory The ability to recall dominant features of one stimulus item or to remember the sequence of several items.     Visual processing speed: 9.9. seconds ( DELAYED)     The Trail Making Test (TMT) is a neuropsychological test of visual attention and task switching.  It consists of two parts in  which the subject is instructed to connect a set of 25 dots as fast as possible while still maintaining accuracy. It can provide information about visual search speed, scanning, speed of processing, mental flexibility, as well as executive functioning.  Results for both TMT A and B are reported as the number of seconds required to complete the task; therefore, higher scores reveal greater impairment.  Trail A (29 seconds = average; ).  Trail B (75 seconds = average; ).  It is unnecessary to continue the test if the patient has not completed both parts after 5 minutes have elapsed.  [name] completed Trail Making A in 46 seconds which is in the DELAYED range.  his score on Trail Making B was 1:53 seconds which is in the DELAYED range with 3 errors    .Engaged patient in dynavision activity with emphasis on standing endurance, BUE strength for reaching, and reaction time.   Patient completed the following trials:  SIMPLE: Trial 1: 1:56 second reaction time, 60 second trial.  Trial 2: 1.29 second reaction time, 60 second trial.     Completed in sitting secondary to poor balance and poor activity stamina.        58   therapeutic exercise     self-care/home management           Timed Treatment TIme:  58    Total Treatment TIme: 108     Patient Education:   Date: 11/29/2021' return to drivingprocess  Education to: [x]  Patient []  Significant other   []  Caregiver []  other   Method: [x]  Explanation []  Demonstration   []  Handouts []  other   Response: [x]  Verbalized understanding []  Demonstrated understanding [x]  Needs reinforcement []  other     Plan:     This note serves as a discharge summary if the patient does not return to OCCUPATIONAL THERAPY per the above POC.       Therapist Signature: Tressia Miners, OTR/L  Date: 11/29/2021

## 2021-11-30 ENCOUNTER — Inpatient Hospital Stay
Admit: 2021-11-30 | Discharge: 2021-11-30 | Payer: MEDICARE | Attending: Student in an Organized Health Care Education/Training Program

## 2021-11-30 ENCOUNTER — Encounter: Admit: 2021-11-30 | Discharge: 2021-11-30 | Payer: MEDICARE | Attending: Speech-Language Pathologist

## 2021-11-30 DIAGNOSIS — R1312 Dysphagia, oropharyngeal phase: Secondary | ICD-10-CM

## 2021-11-30 MED ORDER — VARIBAR HONEY (barium) suspension 1 teaspoonful
40 | Freq: Once | ORAL | Status: AC | PRN
Start: 2021-11-30 — End: 2021-11-30
  Administered 2021-11-30: 15:00:00 1 [tsp_us] via ORAL

## 2021-11-30 MED ORDER — VARIBAR THIN (barium) Powd 70 mL
81 | Freq: Once | ORAL | Status: AC | PRN
Start: 2021-11-30 — End: 2021-11-30
  Administered 2021-11-30: 15:00:00 80 mL via ORAL

## 2021-11-30 MED ORDER — barium sulfate (VARIBAR NECTAR) suspension 65 mL
40 | Freq: Once | ORAL | Status: AC | PRN
Start: 2021-11-30 — End: 2021-12-01

## 2021-11-30 MED ORDER — VARI-BAR (barium) Pste 3 teaspoonful
40 | Freq: Once | ORAL | Status: AC | PRN
Start: 2021-11-30 — End: 2021-11-30
  Administered 2021-11-30: 15:00:00 3 [tsp_us] via ORAL

## 2021-11-30 MED FILL — VARIBAR THIN LIQUID 81 % (W/W) ORAL POWDER: 81 81 % (w/w) | ORAL | Qty: 70

## 2021-11-30 MED FILL — VARIBAR PUDDING 40 % (W/V), 30% (W/W) ORAL PASTE: 40 40 % (w/v), 30% (w/w) | ORAL | Qty: 230

## 2021-11-30 MED FILL — VARIBAR NECTAR 40 % (W/V), 30 % (W/W) ORAL SUSPENSION: 40 40 % (w/v) | ORAL | Qty: 240

## 2021-11-30 MED FILL — VARIBAR HONEY 40 % (W/V), 29% (W/W) ORAL SUSPENSION: 40 40 % (w/v) 29% (w/w) | ORAL | Qty: 250

## 2021-11-30 NOTE — Unmapped (Signed)
Called patient to advise no particular need to follow-up with YJP  No answer  Left message as above

## 2021-11-30 NOTE — Unmapped (Signed)
Modified Barium Swallow Impairment Profile Study    Name: Uriyah Raska  DOB: [birth year]  Attending Physician: Hurshel Keys, MD  Admission Diagnosis: No admission diagnoses are documented for this encounter.  Date: 11/30/2021  Precautions:standard  Reviewed Pertinent hospital course: Yes       Assessment   Pt presents with oropharyngeal dysphagia with Grade 3 swallow safety and swallow efficiency on the DIGEST scale characterized by reduced hyolaryngeal elevation/excursion, reduced base of tongue, reduced posterior pharyngeal stripping wave, reduced PES duration/distention. This results in chronic/ not gross aspiration of thin liquids (silent) with single sips in 5 mL via tsp (trace) and 20 mL sequential sips.    Pt has disk syndrome which inhibits neck ROM. He was able to complete a slight chin tuck with thin via cup/ 15mL with neither trace nor gross aspiration (PAS 3).  Pt has large quantity disperse residue in pharynx after the swallow with honey, puree, and solid food consistencies. There is noted trace aspiration from posterior commissure after multiple dry swallows with puree consistency from residue in pyriform sinuses in which the pt had a spontaneous cough. A liquid wash did assist in clearing pharyngeal residue; however there was some remaining.     The pt was noted to have a wet vocal quality throughout the review of MBS results. There was noted increased material in laryngeal vestibule and vocal folds after fluoro was turned off and back on for next trial.  Aspiration/penetration events were difficult to differentiate between consistencies 2/2 noted aspiration/penetration occurred on residue after the swallow.      Head turn to right: Minimally (subjectively) improved pharyngeal residue after solid food     There is a concern due to severity of swallow function and compensatory strategies that need to be employed that the pt will be able to met nutritional needs with an oral diet. Pt's wife  reported that he has lost 12 lbs recently. The pt reported that he currently chews his food to a pulp and this causes fatigue. Pt may benefit from alternative means of nutrition    Plan/Recommendation     Recommended Solid for Diet: Dysphagia 3 (soft & bite sized)  Recommended Liquid for Diet: thin liquids  Medication Administration: crushed, puree  Compensatory Swallowing Strategies/ Safe Swallowing Behaviors: Small bites/sips, Throat clear and re-swallow, Alternate solids and liquids   Pt was educated to not eat bread products, nuts, fresh vegetables, and fruit with skin. Pt was educated that he can masticate solid food to consistency of puree to decrease residue in pharynx after the swallow.    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            Material does not enter airway  2.            Material enters the airway, remains above the vocal folds,                  and is ejected from the airway.  3.            Material enters the airway, remains above the vocal folds,                  and is not ejected from the airway.  4             Material enters the airway, contacts the vocal folds,                  and is ejected from the airway.  5.            Material enters the airway, contacts the vocal folds,                  and is not ejected from the airway.  6.            Material  enters the airway, passes below the vocal folds,                  and is ejected into the larynx or out of the airway.  7.            Material enters the airway, passes below the vocal folds,                 and is not ejected from the trachea despite effort.  8.           Material enters the airway, passes below the vocal folds,                 and no effort is made to eject.    Thin  Order of Trials: 1,2,3,4,5,9  Pen/Asp Scale Score: MAX: 8  Pen/Asp Scale Score: MOST: 8  Presentation: Spoon, Cup, Self Fed    Nectar Thick  Order of Trials: 11  Pen/Asp Scale Score: MAX: 8  Pen/Asp Scale Score: MOST: 8  Presentation: Cup, Self Fed  Head turn to right    Honey Thick  Order of Trials: 6  Pen/Asp Scale Score: MAX: 1  Pen/Asp Scale Score: MOST: 1  Presentation: Spoon, Self Fed    Puree  Order of Trials: 7  Pen/Asp Scale Score: 6  Presentation: Self Fed, Spoon         Hard Solid  Order of Trials: 8,10  Pen/Asp: 3      Oral Phase  Lip Closure: 0 - no labial escape  Tongue Control/Bolus Hold: 0 - cohesive bolus between tongue to palatal seal  Bolus Transport/Lingual Motion: 2 - slowed tongue motion  Oral residue: 2 - residue collection on oral structures;tongue  Onset of Pharyngeal Swallow: 3 - bolus head in pyriforms    Pharyngeal Phase  Soft Palate Elevation: 0 - no bolus between soft palate (SP) /pharyngeal wall (PW)  Laryngeal Elevation: 1 - partial superior movement of thyroid cartilage/partial approximation of arytenoids to epiglottic petiole  Anterior Hyoid Excursion: 2 - no anterior movement  Epiglottic Movement: 1 - partial inversion  Laryngeal Vestibule Closure: 1 - incomplete, narrow column air/contrast in laryngeal vestibule  Pharyngeal Stripping Wave: 1 - reduced  Pharyngoesophageal  Segment Opening: 2 - minimal distension/minimal duration, marked obstruction of flow  Tongue Base retraction: 3 - wide column of contrast or air between TB and pharyngeal wall  Pharyngeal residue: 2 - collection of residue within or  on pharyngeal structures, pyriform sinuses, valleculae, pharyngeal wall, diffuse > 3 areas    There was noted to be improved epiglottic inversion with puree consistency.    Attempted slight chin tuck with noted elimination of aspiration with continued penetration (PAS 3)    Head turn to right: Minimally (subjectively) improved pharyngeal residue after solid food     Goals  Goals to be met: 11/17   Pt will complete swallow exercises focusing on pharyngeal /laryngeal strengthening (hyolarygneal elevation/ excursion, BOT, and pharyngeal stripping) with 100% accuracy as HEP.    Pt will complete MBS in 2-3 wks         Patient and Family Education   40 minutes was spent with pt and wife regarding education of MBS results, diet recommendations, compensatory strategies, and POC. Both gave verbal understanding of education completed.    DIGEST:                Patient Class   Therapies Series    Idelle Leech MA [facility] SLP [phone]   Speech Language Pathologist   Inpatient Speech Pathology     [address] [place], [place] [address]   [email or web address]

## 2021-11-30 NOTE — Unmapped (Signed)
Spoke with YJP  Follow-up PRN

## 2021-12-01 ENCOUNTER — Inpatient Hospital Stay: Admit: 2021-12-01 | Discharge: 2021-12-05 | Payer: MEDICARE

## 2021-12-01 ENCOUNTER — Inpatient Hospital Stay: Admit: 2021-12-01 | Discharge: 2021-12-06 | Payer: MEDICARE

## 2021-12-01 ENCOUNTER — Ambulatory Visit: Admit: 2021-12-01 | Discharge: 2021-12-05 | Payer: MEDICARE

## 2021-12-01 DIAGNOSIS — Z981 Arthrodesis status: Secondary | ICD-10-CM

## 2021-12-01 DIAGNOSIS — Z4789 Encounter for other orthopedic aftercare: Secondary | ICD-10-CM

## 2021-12-01 MED ORDER — gadobutrol (GADAVIST) 1 mmol/1 mL IV solution 7.5 mL
1 | Freq: Once | INTRAVENOUS | Status: AC | PRN
Start: 2021-12-01 — End: 2021-12-01
  Administered 2021-12-01: 20:00:00 7.5 mL/kg via INTRAVENOUS

## 2021-12-01 MED ORDER — oxyCODONE (ROXICODONE) 5 MG immediate release tablet
5 | ORAL_TABLET | ORAL | 0 refills | 6.00000 days | Status: AC | PRN
Start: 2021-12-01 — End: 2021-12-08

## 2021-12-01 NOTE — Unmapped (Signed)
Neurosurgery Clinic Visit    Reason for Visit / Chief Complaint:   Chief Complaint   Patient presents with    Follow-up       History of Present Illness: Matthew [name] is a 76 y.o. 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  He was previously NPO with NGT but now is tolerating PO with some continued dysphagia.  Pt is doing out patient SLP.  He presents with continued loss of balance and pain in left neck & shoulder.  His previous CT scan showed concern for continued compression of his spinal cord due to OPLL and osteophytes.  Pt continues to take PRN pain medication and has began lyrica with good results. Denies fevers, drainage, and SOB.     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    Environmental allergies     Hearing loss     left (since age 45), right more recent    High cholesterol     Hypertension     Neck symptom     Trouble turning head to the left     Sleep apnea     Sleep disorder        Past Surgical History:   Procedure Laterality Date    ACHILLES TENDON REPAIR  01/31/2002    left     APPENDECTOMY      HERNIA REPAIR  02/01/1951    INCISION AND DRAINAGE POSTERIOR LUMBAR SPINE N/A 10/30/2021    Procedure: possible lumbar drain;  Surgeon: Tempie Hoist, MD;  Location: [facility] OR;  Service: Neurosurgery;  Laterality: N/A;    IRRIGATION AND DEBRIDEMENT SPINE N/A 10/30/2021    Procedure: CSF leak repair, exploration of the neck;  Surgeon: Tempie Hoist, MD;  Location: [facility] OR;  Service: Neurosurgery;  Laterality: N/A;    KNEE SURGERY Left 1976    SPINE SURGERY      TONSILLECTOMY      WISDOM TOOTH EXTRACTION         Social History     Socioeconomic History    Marital status: Married     Spouse name: Not on file    Number of children: Not on file     Years of education: Not on file    Highest education level: Not on file   Occupational History    Not on file   Tobacco Use    Smoking status: Never    Smokeless tobacco: Never   Vaping Use    Vaping Use: Never used   Substance and Sexual Activity    Alcohol use: Yes     Alcohol/week: 4.0 standard drinks     Types: 2 Glasses of wine, 2 Cans of beer per week    Drug use: No     Comment: 02/16/10    Sexual activity: Not on file   Other Topics Concern    Caffeine Use Yes    Occupational Exposure Not Asked    Exercise Yes    Seat Belt Yes   Social History Narrative    Not on file     Social Determinants of Health     Financial Resource Strain: Not on file   Physical Activity: Not on file  Stress: Not on file   Social Connections: Not on file   Housing Stability: Not on file       The patient has a family history of   Family History   Problem Relation Age of Onset    Colon Cancer Mother     Heart failure Father        History from paper and electronic chart reviewed. Patient has no additional relevant past medical, past surgical, family, or social history.    Medications:  Current Outpatient Medications   Medication Sig    acetaminophen Take 2 tablets (650 mg total) by mouth every 6 hours.    albuterol Inhale 2 puffs into the lungs every 6 hours as needed for Wheezing or Shortness of Breath.    ALPHA LIPOIC ACID ORAL Take 1 capsule by mouth daily.    amLODIPine Take 1 tablet (5 mg total) by mouth daily.    azelastine Use 2 sprays into each nostril 2 times a day.    baclofen Take 1 tablet (10 mg total) by mouth daily as needed (Muscle spasms).    bimatoprost Place 1 drop into both eyes daily.    brimonidine Place 1 drop into the right eye 2 times a day.    cetirizine Take 1 tablet (10 mg total) by mouth daily.    DULoxetine Take 1 capsule (60 mg total) by mouth daily.    finasteride Take 0.5 mg by mouth daily.    fluticasone propionate Use 1 spray into each nostril daily.    melatonin Take 3 tablets (9 mg total) by mouth  at bedtime.    methocarbamoL Take 1 tablet (500 mg total) by mouth 3 times a day as needed.    naloxone Apply 1 spray in one nostril if needed. Call 911. May repeat dose in other nostril if no response in 3 minutes.    pregabalin Take 1 capsule (150 mg total) by mouth 2 times a day.    rosuvastatin Take 1 tablet (10 mg total) by mouth every evening.    senna-docusate Take 1 tablet by mouth 2 times a day.    tadalafiL Take 1 tablet (20 mg total) by mouth daily as needed for Erectile Dysfunction.    timolol Place 1 drop into both eyes every 12 hours.    doxepin Take 1 capsule (10 mg total) by mouth at bedtime as needed.    gabapentin Take 2 capsules (200 mg total) by mouth 3 times a day.    oxyCODONE Take 1 tablet (5 mg total) by mouth every 4 hours as needed for Pain for up to 7 days.    QUEtiapine Take 1 tablet (25 mg total) by mouth at bedtime.     No current facility-administered medications for this visit.       Medications from paper and electronic chart reviewed.  Patient has no additional pertinent medications    Physical Exam:    Vitals:    12/01/21 1003   BP: 120/78   BP Location: Right upper arm   Patient Position: Sitting   BP Cuff Size: Regular   Pulse: 64   Temp: 96 F (35.6 C)   SpO2: 95%   Weight: 162 lb (73.5 kg)   Height: 5' 11 (1.803 m)     Physical Exam:  Vitals:  Vitals:    12/01/21 1003   BP: 120/78   Pulse: 64   Temp: 96 F (35.6 C)   SpO2: 95%  General:  in no apparent distress and well developed and well nourished; appears stated age  HENT:   NC/AT, external ears normal to inspection, hearing normal -trachea midline   Eye:   normal conjunctiva/cornea, no scleral icterus, no injection, EOM intact, PERRL  Pulm:  no respiratory distress   MSK:   Normal muscle bulk with no contractures or deformities  Wound/Incision:  with no signs of drainage, redness, healing as expected    Neurologic Exam:  Mental Status: Alert/Oriented to person, place and day  Speech: Normal, Answers questions  appropriately  Cranial Nerves: Face symmetric, tongue midline, hearing & vision grossly intact   Memory:  recent and remote memory intact    Motor: Moving all extremities spontaneously, full ROM, symmetric and full strength in all extremities  Sensation  Sensation intact to light touch in C5-T1 dermatomes  Sensation intact to light touch in L2-S1 dermatomes                  Imaging reviewed:   FL MODIFIED BARIUM SWALLOW, INC SCOUT/DELAYED IMAGES w SPEECH Therapy  Narrative: EXAM: FL MODIFIED BARIUM SWALLOW INC SCOUT/DELAYED IMAGES WITH SPEECH THERAPY    INDICATION: Oropharyngeal dysphagia    FLUOROSCOPY TIME: 4 minutes 41 seconds.    TECHNIQUE: Videofluoroscopic swallowing examination was performed in conjunction with the speech pathology department. Various substances were used to assess swallowing including puree, honey, nectar, and thin consistencies of barium.    FINDINGS:    Postsurgical changes from ACDF at C3-4. Diffuse ossification of the anterior longitudinal ligament with preservation of the vertebral bodies, consistent with diffuse idiopathic skeletal hyperostosis. Resolution of previously demonstrated prevertebral swelling.    Delayed oral transit was observed. Pooling was evident within the valleculae and pyriformis sinuses across all trials.     Tracheal aspiration with thin consistency. Penetration with nectar, honey, and pureed consistencies.  Impression: IMPRESSION:    1.  Tracheal aspiration with thin consistency.  2.  Laryngeal penetration with nectar, honey, and pureed consistencies.     For further information as well as dietary recommendations, please refer to the speech pathologist's detailed report.    Report Verified by: Carmelia Bake, DO at 11/30/2021 12:30 PM EDT      X-rays show intact hardware with stable post-surgical changes.  CT scan shows OPLL with concern for continued cervical stenosis and suspected cervical cord compression.    Assessment  Matthew [name] 76 y.o. male with  Matthew [name] was seen today for follow-up.    Diagnoses and all orders for this visit:    S/P cervical spinal fusion  -     MRI Cervical spine W WO contrast; Future  -     oxyCODONE (ROXICODONE) 5 MG immediate release tablet; Take 1 tablet (5 mg total) by mouth every 4 hours as needed for Pain for up to 7 days.  Dispense: 42 tablet; Refill: 0          1. S/P cervical spinal fusion  MRI Cervical spine W WO contrast    oxyCODONE (ROXICODONE) 5 MG immediate release tablet            Plan   Matthew [name] is a 76 y.o. 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  He was previously NPO with NGT but now is tolerating PO with some continued dysphagia.  Pt is doing out patient  SLP.  He presents with continued loss of balance and pain in left neck & shoulder.  His previous CT scan showed concern for continued compression of his spinal cord due to OPLL and osteophytes.  Pt continues to take PRN pain medication and has began lyrica with good results. Denies fevers, drainage, and SOB.   As patient continues to have pain in neck and shoulder with continued concern for cervical stenosis will order MRI to evaluate for C4/C5 compression.   RTC in 1 week to discuss potential need for further intervention.   RX'd oxycodone for acute pain.     Portions of this clinic note was copied forward from my last progress note. I have reviewed and updated the history, physical exam, data, assessment and plan of the note so it reflects the evaluation and management of the patient on today's date   Today's body mass index is 22.59 kg/m. BMI is normal.    I spent a total of 30 minutes obtaining a history, examining, and coordinating care for this patient.  This time spent includes the details in my assessment and plan above and also the following activities performed on the same day as  the encounter: preparing to see the patient (eg, review of tests), counseling and educating the family/caregiver , ordering medications, tests, or procedures, referring and communicating with other health care professionals , documenting clinical information in the electronic or other health record, and independently interpreting results and communicating results to the patient/family/caregiver     Thank You    Matthew Center, CNP  12/01/2021  [facility]  [facility]  [address], [address]  [place] [address]  Dept: [phone]  Loc: [phone]

## 2021-12-01 NOTE — Unmapped (Signed)
Am I scheduled with the correct MD/APP  [facility]  All Imaging in pacs  yes  All testing and recommendations completed, ie; EMG, PT, INJECTIONS  MRI completed   All imaging pulled from [facility] into pacs  not needed  All imaging requested from outside facilities sent to imagingrequests to merge  not needed  [facility] and needs [facility]rays prior did I call pt to have them arrive at least 30 minutes early and notify where xray is located?  [facility]  Checked decision tree for images  not needed  Checked and updated care everywhere for images, notes, etc  not needed   Additional comments  MRI completed surgical candidate

## 2021-12-01 NOTE — Unmapped (Addendum)
An After Visit Summary was printed and given to the patient.    Your order for an MRI has been placed. Please call [phone]) to schedule appointment.    Your prescription for oxycodone has been sent to your pharmacy.     Your 1 week follow up appointment has been scheduled. Please make sure your MRI is completed PRIOR to your office visit.

## 2021-12-02 ENCOUNTER — Encounter

## 2021-12-02 ENCOUNTER — Ambulatory Visit: Payer: MEDICARE

## 2021-12-02 NOTE — Unmapped (Signed)
Pt wife called in needing to reschedule [name]'s appt with Dr. Waunita Schooner. She is requesting you call back her number that is provided below.       [phone]

## 2021-12-02 NOTE — Unmapped (Signed)
Called pt's wife and rescheduled injection appt.

## 2021-12-03 ENCOUNTER — Ambulatory Visit: Payer: MEDICARE | Attending: Student in an Organized Health Care Education/Training Program

## 2021-12-03 ENCOUNTER — Encounter: Admit: 2021-12-03 | Discharge: 2021-12-03 | Payer: MEDICARE

## 2021-12-03 DIAGNOSIS — M4812 Ankylosing hyperostosis [Forestier], cervical region: Secondary | ICD-10-CM

## 2021-12-03 DIAGNOSIS — R1312 Dysphagia, oropharyngeal phase: Secondary | ICD-10-CM

## 2021-12-03 DIAGNOSIS — Z789 Other specified health status: Secondary | ICD-10-CM

## 2021-12-03 NOTE — Unmapped (Addendum)
Occupational Therapy Treatment Encounter Note  Name: Matthew [name]  Date of Birth: [birth year]  MRN: [id number]    Referring Physician: Hurshel Keys, MD  [address]  Neurology  [place],  [place] [address] Phone: [phone]  Fax: [phone]    Treatment Diagnosis: Impaired IADL's    Current Encounter History: History and Reason for Referral:: Matthew [name] is a 76 y.o. 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 Pt with PMHx including cervical dystonia, cerebellar ataxia, and recent C3-4 ACDF coomplicated by CSF leak with lumbar drain placement on 10/29/21. Pt discharged from hospital to IPR and has been home for ~2 weeks per pt/spouse report. At baseline pt stating IND with all I/ADLs and mobility without AD. He enjoyed regular exercises and playing tennis. Currently pt is using RW for functional mobility and is completing ADLs at a seated vs standing level due to LE fatigue. 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 Pt would benefit from OP OT at this time to address the above mentioned deficits to increase function and improve upon quality of life.     DATE OF INITIAL OT EVALUATION :10/26  VERIFIED SIGNED POC:  [x]  yes; DATE:      Cosigned by: Hurshel Keys, MD at 11/26/2021  9:16 AM       Attestation signed by Hurshel Keys, MD at 11/26/2021  9:16 AM     Electronically Signed By:  Lorne Skeens, MD  Physical Medicine and Rehabilitation  11/26/2021 9:16 AM                 Activity Date:  10/30  Date:   11/03 Date:  Date:  Date:  Date:  Date:  Date:  Date:     Visit # 2 3 4 5 6 7 8 9 10    Checked for POC Certification                     Insurance: Payor: MEDICARE / Plan: MEDICARE A AND B / Product Type: Medicare /   Therapy  Goals:     Short Term Goals to be met within 4 sessions INITIAL STATUS REASSESSMENT:  DATE:   1. Patient will demonstrate independence with progressive HEP for [facility] and energy conservation strategies to address functional deficits with continued progress after discharge.     Initiated hand coordination HEP during intitial eval, would benefit from reinforcement []  PROGRESS MADE/        CONTINUE GOAL  []  GOAL MET  []  DC GOAL   Long Term Goals to be met within 8 sessions:  INITIAL STATUS REASSESSMENT:   Pt will be educated on AE for dressing to maximize IND and improve QOL.   Initiated education on elastic shoe laces and button hook at initial eval [x]  PROGRESS MADE/        CONTINUE GOAL  []  GOAL MET  []  DC GOAL     2. Pt will tolerate >20 mins of functional mobility while completing simple IADL task to demonstrate improve endurance  Reports fatigue, per patient report is transitioning out of RW []  PROGRESS MADE/        CONTINUE GOAL  []  GOAL MET  []  DC GOAL     3. Patient will decrease time on 9 hole peg test  by at least 5 seconds with the B UE in order to increase independence with clothing management.     Right hand: 47 seconds    Left hand: 43 seconds []  PROGRESS MADE/        CONTINUE GOAL  []  GOAL MET  []  DC GOAL     4. Pt will be educated on accessibility features to improve usage. Limited by neuropathy, states texting is very frustrating, decreased accuracy   []  PROGRESS MADE/        CONTINUE GOAL  []  GOAL MET  []  DC GOAL     5. Patient will complete cognitive and visual perceptual assessments in order to further assess safety with community mobility.     Is moving, would like to complete screening processes for returned to driving prior to upcoming move at end of november []  PROGRESS MADE/        CONTINUE GOAL  []  GOAL MET  []  DC GOAL       Pain:0/10 Location:                            Precautions:none  Subjective:   Date:   12/03/21: ADDENDUM: Pt states he is delaying move to [place] to January. Pt stating he  would like to work on dynamic standing balance this date. Reports wife purchased [facility] activities/games that he has been using at home  11/29/2021 : States he is moving at end of November to [place]. Is frustrated with physical and cognitive decline since surgeries.     Treatment Session:     CPT code SKILLED INTERVENTIONS AND EDUCATION: Timed Code Treatment Minutes:    therapeutic activity Engaged patient in SET card game, a visual perceptual game. Game requires [facility] to shuffle and pick up cards, visual scanning, visual processing, cognition to remember rules, problem solving, sequencing, and mental flexibility.     Prior to initiating activity, instructed pt sort cards based on characteristics (color, shape, number). Pt then made sets of 3 with 2 characteristics being the same, one characteristic being different with all cards available. X4 sets with SBA and verbal cues. Pt reorganizing cards on table to make set    Activity graded with patient finding sets with 9 cards available at a time, no reorganization of cards on tabletop. >10 seconds initially. During second round of activity pt able to identify sets <5 seconds.  20   therapeutic exercise Pt reporting his biggest goal with therapy is to return to preferred leisure activity (tennis). Education provided on goal regarding standing tolerance for ADL participation. Pt also endorsing reaching down to pick items off the floor is difficult at home. Exercises this date emphasized dynamic standing balance, standing tolerance, and safety when reaching outside BOS:    ~20 min stance during therapeutic activity with supv for static standing balance    -with tennis racket and ball ~10 mins of bouncing ball to self, no overt LOB, slight unsteadiness    -~5 mins tennis rack and ball toss to wall, slight LOB with lateral steps, min A to self correct, improved to CGA with reps    -with medicine ball and partner ~5 mins of toss and catch outside BOS, LOB min A with lateral  steps throughout activity.    3# dumbbells bicep curls, shoulder flex to 90, wrist flex, wrist ext   40   self-care/home management           Timed Treatment TIme:  60    Total Treatment TIme: 84     Patient Education:   Date:   12/03/21: dynamic standing required for household task and return to leisure activity  11/29/2021' return to drivingprocess  Education to: [x]  Patient []  Significant other   []  Caregiver []  other   Method: [x]  Explanation []  Demonstration   []  Handouts []  other   Response: [x]  Verbalized understanding []  Demonstrated understanding [x]  Needs reinforcement []  other     Plan:   Simulated ADL to address dynamic standing balance  Cont community mobility screenings      This note serves as a discharge summary if the patient does not return to OCCUPATIONAL THERAPY per the above POC.       Therapist Signature: Antonieta Iba, OTR/L  Date: 12/03/2021

## 2021-12-03 NOTE — Unmapped (Addendum)
OUTPATIENT SPEECH LANGUAGE PATHOLOGY  DAILY NOTE and updated plan of care    Name:Matthew [name] DOB:08/03/1945 ZOX:[id number]  Today's Date: 12/03/2021  Referring Provider: Lorne Skeens  [address] Neurology   [facility] [address]     Primary Diagnosis:76 y/o male s/p C3-4 ACDF on 9/19 complicated by intraoperative CSF leak presents with progressive R neck swelling concerning for continued CSF leak. 9/30: underwent CSF leak repair and neck exploration.   Onset Date: 10/19/2021 first surgery with revision on  10/29/2021  Diagnosis:dysphagia, memory complaints  Insurance Plan:Payor: MEDICARE / Plan: MEDICARE A AND B / Product Type: Medicare /   # visits per insurance authorization:   11/23/2021/[facility] [id number] AUTH INFO   PT,OT, ST -PLAN: MEDICARE PRIMARY  DRAKE IS IN NETWORK  PT & ST GET $2230 COMBINED PCY USED- $1266.95 POSSIBLE 12 VISITS REMAINING  OT GETS $2230 PCY USED - $75.57 POSSIBLE 26 VISITS REMAINING  CO-INS : 80%  Date of initial eval: 11/25/2021  # visits per POC: 10  Date of initial POC Certification: 11/25/2021  Verified Signed POC Certification:  Yes: x  on 11/26/2021     Date of POC Recertification: n/a  Verified Signed POC Recertification: 12/03/2021   Date of Progress Report (every 10 visits):     POC Recertification due(Medicaid 60 days/Medicare 90 days): 02/25/2022        Goals:  Goal 11/29/2021 12/03/2021      Complete formal cognitive assessment and add goals as needed.  Completed, see results below.  Will determine need for adding goals per discussion with pt. Discussed results.  Despite some scores in low average range it was determined to not purse cognitive goals at this time.      Complete MBS and add goals as needed.  Scheduled for 11/30/2021 at 10:30 Completed.       Pt will complete swallow exercises focusing on pharyngeal /laryngeal strengthening (hyolarygneal elevation/ excursion, BOT, and pharyngeal stripping) with 100% accuracy as HEP.  New goal      Pt will  complete MBS in 2-3 wks  To be scheduled      Pt will demonstrate swallow strategies independently  Demonstrated small sips and throat clear      Patient/caregivers will demonstrate understanding of role of speech therapy diagnosis, prognosis, home exercise programs, plan of care, recommendations, and safety precautions. ongoing ongoing                                        ______________________________________________________________________  Visit #: 2 Charges: 92526 and 84132 x 1 (15 minutes)  Total Minutes: 60    Pt arrived unaccompanied.     Tested tongue strength with IOPI and was measured as 41kPa today.  Average for pt's age is 57 kPa.  41kPa places pt at 10th percentile.         Repeat all exercises 10 times/ 3 times/day.      Effortful swallow:  Swallow as hard as you can like you are swallowing a golf ball    Bite your tongue while your swallowing.  Do not do this with food or drink in your mouth.  You will choke.     Mendelsohn Maneuver.   Hold your Adams apple, swallow and hold your swallow at the highest peak    Push the tongue bulb on the roof of your mouth (front) and hold there for a count of 5  Push the tongue bulb on the roof of your mouth (middle/back) and hold there for a count of 5    Education Addressed During This Session: MBS results, RBANS results, swallow exercises, and plan for continued therapy. Discussed possibility of alternative nutrition if pt is unable to meet nutrition requirements.  Pt not interested in a PEG tube at this time.      Plan:  2x/week x 5 weeks         Recertification/ updated plan of care:  MBS completed.  Goals added.  Recommend therapy at 2x/week x 5 weeks for goals stated below.         Potential Benefits of Treatment/Long Term Goal: improve satisfaction of swallow function in order to meet nutrition and hydration needs to >8/10    Barriers To Progress: none    Rehab Potential:   good  and Due to:   for goals stated, pt support and motivation     Pain Reported  and Staff Response: none    Informed Consent: Patient has been educated regarding treatment risks/benefits and agrees with plan of care.      Medications reviewed in Epic on 12/03/2021         Plan:  Dysphagia Therapy  Frequency: 2 times per week for 5 weeks    Physician Certification:  I certify that the above patient is under my care and requires the above services.  These professional services are to be provided from an established plan, related to the diagnosis and reviewed by me every 10th treatment visit.    Physician name (printed):    Physician Signature:    Date:       Jola BaptistSherry E. Kayson Tasker MA CCC-SLP [phone]  Phone number [phone]  Email: [email or web address]       This note serves as a discharge summary if the patient does not return to Speech Therapy per the above POC.

## 2021-12-03 NOTE — Unmapped (Signed)
-----   Message from [facility], CCC-SLP sent at 12/03/2021 12:29 PM EDT -----  Regarding: RE: minimally improved dysphagia?  Thanks all for your feedback.  I will talk to him about PEG and hopefully he will schedule with you also Evan.    Cordelia Pen  ----- Message -----  From: Hurshel Keys, MD  Sent: 12/03/2021  10:33 AM EDT  To: Rhoderick Moody, RN, #  Subject: RE: minimally improved dysphagia?                I also think it should have been better. I was supposed to see him in clinic but he cancelled his appointment for 11/14. Can we encourage him to come in? I have cc'ed my care coordinator- this is an HDF from [place] who apparently isn't doing great in the outpatient. Not sure where he is doing therapy but I can see him at [facility] or [facility] on a therapy day    Evan    ----- Message -----  From: Derrill Center, CNP  Sent: 12/03/2021   9:31 AM EDT  To: Shirlean Kelly, CCC-SLP, Hurshel Keys, MD  Subject: RE: minimally improved dysphagia?                It should get better slowly and its most likely due to muscle trauma/retraction during surgery.  Has he expressed any interest in a PEG tube?  If you think its what is best then we can talk about it but only if he is on board.   Josh   ----- Message -----  From: Shirlean Kelly, CCC-SLP  Sent: 12/03/2021   8:24 AM EDT  To: Derrill Center, CNP, Hurshel Keys, MD  Subject: minimally improved dysphagia?                    I wanted to reach out to both of you as we are all part of the team helping Mr Loughry.  I am seeing him for speech therapy for swallowing difficulties.  We repeated his MBS this week and unfortunately his swallowing continues to be severely impaired due to severe residue and aspiration. He has lost around 10 to 15 pounds.  I'm reaching out to you to see what you both think [name]'s prognosis for improvement with his swallowing is?  I thought it should have been significantly better because I was assuming the swelling was causing a lot of the  difficulties.  If the progress is going to be slow, [name] may need a PEG tube?  Thanks for your help.    Jola Baptist MA CCC-SLP [phone]  Phone number [phone]  Email: [email or web address]

## 2021-12-03 NOTE — Unmapped (Signed)
PT DAILY TREATMENT NOTE    Diagnosis:   1. Ankylosing hyperostosis (forestier), cervical region  AMB Follow-up to Physical Therapy      2. Encounter for other orthopedic aftercare  AMB Follow-up to Physical Therapy      3. Impaired functional mobility, balance, gait, and endurance  AMB Follow-up to Physical Therapy      4. Muscle weakness  AMB Follow-up to Physical Therapy              Referring Provider:Zeldin, Floyce Stakes, MD    Insurance plan:   Payer/Plan Subscr DOB Sex Relation Sub. Ins. ID Effective Group Num   1. MEDICARE - MEJASTIN, FORE* 1945-04-20 Male Self [id number] 10/02/11                                    [address]   2. Kristin Bruins Hecker* [birth year] Male Self [id number] 10/01/13                                    [address]                         # of visits per insurance authorization:   PT,OT, ST -PLAN: MEDICARE PRIMARY  DRAKE IS IN NETWORK     PT OT SP-  PATIENT HAS AARP SUPPLEMENT INS SECONDARY  ONLY PAYS IF MEDICARE PAYS    # of visits per POC: Eval + 12 visits    Date of Initial Eval: 11/25/2021    Date of Last Progress Report: NA    Therapy Goals:   Short Term Goals: to be met by 2 weeks  Initial status (11/25/2021)   Pt will be compliant and independent with HEP in order to maintain progress made in physical therapy.  To be initiated     Pt will ambulate household distances without AD and no more than supervision in order to access home environment more independently.  Mod-I with RW; CGA-minA without AD         LongTerm Goals: to be met by time of discharge Initial status (11/25/2021)   Pt will improve LE MMT to 5/5 in order to improve strength needed for functional tasks, body mechanics, and overall symptoms.  Hip flexion: Left: 4+/5 Right: 4+/5  Hip abduction: Left: 4/5 Right: 4+/5  Hip extension: Left: 3+/5 Right: 3+/5   Pt will perform Timed Up and Go in <12 secs, without AD and with independence, in order to demonstrate decreased fall risk and appropriate safety and  independence with functional mobility.  10.81 with mod-I and RW  17.1 without AD, CGA   Pt will improve distance on 6 Minute Walk Test by at least 164 ft, ambulating without AD and no more than SPV, in order to achieve MCID/MDC, improve endurance, and improve community mobility.  1032' without AD, minA   Pt will participate in one standardized balance/gait measure in order to assess fall risk and community mobility. Goal to be made accordingly.  To be initiated       ______________________________________________________________________  Pain:0/10 Location: Neuropathic pain                        Precautions: Cervical surgery, fall risk, orthostasis     Activity Date: 11/25/2021 Date: 11/29/2021 Date:  12/03/2021 Date:    Visit # Eval, 1 2 3     Checked for POC Certification  Yes       Verified Signed POC:    YES[x]       NO[]            Date:  Attestation signed by Hurshel Keys, MD at 11/25/2021  4:01 PM  Rerouted to Referral Source for signature via: EMR[]   Fax[]    Mail[]       Date:  Routed to PCP (if other than referring) for signature via: EMR[]   Fax[]     Mail[]      Date:  Was POC updated?        YES[]         NO[]                            Date:        If yes, Verified Signed POC:      YES[]             NO[]            Date:      CPT code  Daily treatment record Timed Code Treatment Minutes     Neuromuscular Re-education: 97112 Vitals at beginning of session: 114/73, HR 68 BPM    HIIT performed on NuStep x 11 mins for neural priming, aerobic conditioning, reciprocal patterning, and muscular strengthening. Warmup performed @ Level 2 followed by 1 min, higher speed/resistance intervals @ Level 6 >80 SPM, followed by 1 min lighter intervals for recovery x 4 rounds.     Standing trunk rotation, placing cones from low mat to high tray table, reaching across body performed with CGA-minA to address dynamic standing balance, reaching outside BOS, and encouraging increased spinal mobility.  27     Therapeutic Exercise:  97110   The following were performed on mat table to improve trunk rotation and spinal mobility:  - LTR with UE in T x10*  - Sidelying book openers x10 ea side*    *Added to HEP 12     Gait Training: 28413   Gait training performed around clinic without AD with CGA-minA and cues for upright posture, step length, and avoiding scissoring of LE. Progressed to sidestepping and bwd walking with pt typically pitching anteriorly over toes, often needing to stop and reset.  19   Response to Treatment No LOB with gait training without AD and fwd gait improved after bwd walking practice. Posture easily fades into fwd lean, difficulty maintaining COM over heels or center of feet. Very limited trunk rotation but did well with mobility and balance exercises.      Total timed coded treatment minutes:  58    Total treatment time (timed + untimed):  58     Plan for Next Visit: Gait training without device, multi-directional stepping and gait. Functional strengthening and balance HEP. Stretches as indicated.     Additional Information:     Patient Education:   12/03/2021: LTR and book openers added to HEP. Educated on continuing to use AD.   11/29/2021: Discussed fall risk indicators with FGA score and importance of continuing to use AD. Discussed safe use of recumbent bike at home gym and how to slowly increase tolerance and time. Discussed performing as neural priming prior to walking practice at home. HEP: SL bridge, narrow stance with head turns/nods, staggered stance, fwd/bwd/side step in place.   11/25/2021: Pt educated on PT initial evaluation findings, PT  plan of care, and follow-up treatment. Pt verbalized understanding. Educated on continuing to build walking endurance and aerobic conditioning with laps with RW. HEP including SL bridge for hip strengthening; handout provided. Discussed future safe gym interventions.     This note serves as a discharge summary if the patient does not return to Physical Therapy per the above  POC.

## 2021-12-06 ENCOUNTER — Encounter: Admit: 2021-12-06 | Discharge: 2021-12-06 | Payer: MEDICARE

## 2021-12-06 DIAGNOSIS — R1312 Dysphagia, oropharyngeal phase: Secondary | ICD-10-CM

## 2021-12-06 DIAGNOSIS — M4812 Ankylosing hyperostosis [Forestier], cervical region: Secondary | ICD-10-CM

## 2021-12-06 DIAGNOSIS — Z789 Other specified health status: Secondary | ICD-10-CM

## 2021-12-06 NOTE — Unmapped (Signed)
Occupational Therapy Treatment Encounter Note  Name: Matthew [name]  Date of Birth: 13-Dec-1945  MRN: [id number]    Referring Physician: Hurshel Keys, MD  [address]  Neurology  [place],  [place] [address] Phone: [phone]  Fax: [phone]    Treatment Diagnosis: Impaired IADL's    Current Encounter History: History and Reason for Referral:: Matthew [name] is a 76 y.o. 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 Pt with PMHx including cervical dystonia, cerebellar ataxia, and recent C3-4 ACDF coomplicated by CSF leak with lumbar drain placement on 10/29/21. Pt discharged from hospital to IPR and has been home for ~2 weeks per pt/spouse report. At baseline pt stating IND with all I/ADLs and mobility without AD. He enjoyed regular exercises and playing tennis. Currently pt is using RW for functional mobility and is completing ADLs at a seated vs standing level due to LE fatigue. 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 Pt would benefit from OP OT at this time to address the above mentioned deficits to increase function and improve upon quality of life.     DATE OF INITIAL OT EVALUATION :10/26  VERIFIED SIGNED POC:  [x]  yes; DATE:      Cosigned by: Hurshel Keys, MD at 11/26/2021  9:16 AM       Attestation signed by Hurshel Keys, MD at 11/26/2021  9:16 AM     Electronically Signed By:  Lorne Skeens, MD  Physical Medicine and Rehabilitation  11/26/2021 9:16 AM                 Activity Date:  10/30  Date:   11/03 Date:  11/06  Date:  Date:  Date:  Date:  Date:  Date:     Visit # 2 3 4 5 6 7 8 9 10    Checked for POC Certification                     Insurance: Payor: MEDICARE / Plan: MEDICARE A AND B / Product Type: Medicare /    Therapy Goals:     Short Term Goals to be met within 4 sessions INITIAL STATUS REASSESSMENT:  DATE:   1. Patient will demonstrate independence with progressive HEP for [facility] and energy conservation strategies to address functional deficits with continued progress after discharge.     Initiated hand coordination HEP during intitial eval, would benefit from reinforcement []  PROGRESS MADE/        CONTINUE GOAL  []  GOAL MET  []  DC GOAL   Long Term Goals to be met within 8 sessions:  INITIAL STATUS REASSESSMENT:   Pt will be educated on AE for dressing to maximize IND and improve QOL.   Initiated education on elastic shoe laces and button hook at initial eval [x]  PROGRESS MADE/        CONTINUE GOAL  []  GOAL MET  []  DC GOAL     2. Pt will tolerate >20 mins of functional mobility while completing simple IADL task to demonstrate improve endurance  Reports fatigue, per patient report is transitioning out of RW []  PROGRESS MADE/        CONTINUE GOAL  []  GOAL MET  []  DC GOAL     3. Patient will decrease time on 9 hole  peg test by at least 5 seconds with the B UE in order to increase independence with clothing management.     Right hand: 47 seconds    Left hand: 43 seconds []  PROGRESS MADE/        CONTINUE GOAL  []  GOAL MET  []  DC GOAL     4. Pt will be educated on accessibility features to improve usage. Limited by neuropathy, states texting is very frustrating, decreased accuracy   []  PROGRESS MADE/        CONTINUE GOAL  []  GOAL MET  []  DC GOAL     5. Patient will complete cognitive and visual perceptual assessments in order to further assess safety with community mobility.     Is moving, would like to complete screening processes for returned to driving prior to upcoming move at end of november []  PROGRESS MADE/        CONTINUE GOAL  []  GOAL MET  []  DC GOAL       Pain:0/10 Location:                            Precautions:none  Subjective:   Date:   12/06/21: States he wants to extend OT visits through end of the year until he  moves to [place]. Verbalized concerns over balance, activity stamina, cognition.  12/03/21: ADDENDUM: Pt states he is delaying move to [place] to January. Pt stating he would like to work on dynamic standing balance this date. Reports wife purchased [facility] activities/games that he has been using at home  11/29/2021 : States he is moving at end of November to [place]. Is frustrated with physical and cognitive decline since surgeries.     Treatment Session:     CPT code SKILLED INTERVENTIONS AND EDUCATION: Timed Code Treatment Minutes:    therapeutic activity -To further address memory and attention to detail deficits, OT engaged patient in the following tasks:    - provided patient a detailed story, immediate recall with 80 % accuracy and at session end 85% accuracy. Patient able to provide generalized recall of information but struggled with the attention to detail    15   therapeutic exercise Engaged this date emphasized dynamic standing balance, standing tolerance, and safety when reaching outside BOS:    ~22 min stance during therapeutic activity with CGA for dynamic standing balance and SUPV for static standing balance    -completed rebounder x 30 reps wit 2# ball in standing, slight unsteadiness. OT provided CGA for stability.    - side stepping x4-5 steps each direction without AD or UE support with CGA    -with overhead reaching, threaded and threaded links on chain with string. Reported arm fatigue with overhead reaching. Deficits noted with problem solving for unthreading string. He attempted to pull from end rather than pull in smaller sections. Cues provided to technique.    3# dumbbells for chest press x 10 each arm while seated on swiss ball, patient was very challenged for maintaining balance.    - swiss ball exercises for core stability: hands across chest with cues for weight shifts side to side x10, forward/ back x10 and circles x10; patient stated it was very difficult and he felt very off balance and  unsteady. Required transfer back to chair with min assist as it was difficult and fatiguing.      46   self-care/home management           Timed  Treatment TIme:  61    Total Treatment TIme: 59     Patient Education:   Date:   12/06/21: stamina, balance and cognitive retraining  12/03/21: dynamic standing required for household task and return to leisure activity  11/29/2021' return to drivingprocess  Education to: [x]  Patient []  Significant other   []  Caregiver []  other   Method: [x]  Explanation []  Demonstration   []  Handouts []  other   Response: [x]  Verbalized understanding []  Demonstrated understanding [x]  Needs reinforcement []  other     Plan:   Simulated ADL to address dynamic standing balance  Cont community mobility screenings      This note serves as a discharge summary if the patient does not return to OCCUPATIONAL THERAPY per the above POC.       Therapist Signature: Tressia Miners, OTR/L  Date: 12/06/2021

## 2021-12-06 NOTE — Unmapped (Signed)
PT DAILY TREATMENT NOTE    Diagnosis:   1. Ankylosing hyperostosis (forestier), cervical region  AMB Follow-up to Physical Therapy      2. Encounter for other orthopedic aftercare  AMB Follow-up to Physical Therapy      3. Impaired functional mobility, balance, gait, and endurance  AMB Follow-up to Physical Therapy      4. Muscle weakness  AMB Follow-up to Physical Therapy              Referring Provider:Zeldin, Floyce Stakes, MD    Insurance plan:   Payer/Plan Subscr DOB Sex Relation Sub. Ins. ID Effective Group Num   1. MEDICARE - MEABRAHIM, SARGENT* 25-Feb-1945 Male Self [id number] 10/02/11                                    [address]   2. Kristin Bruins Robinson* October 11, 1945 Male Self [id number] 10/01/13                                    [address]                         # of visits per insurance authorization:   PT,OT, ST -PLAN: MEDICARE PRIMARY  DRAKE IS IN NETWORK     PT OT SP-  PATIENT HAS AARP SUPPLEMENT INS SECONDARY  ONLY PAYS IF MEDICARE PAYS    # of visits per POC: Eval + 12 visits    Date of Initial Eval: 11/25/2021    Date of Last Progress Report: NA    Therapy Goals:   Short Term Goals: to be met by 2 weeks  Initial status (11/25/2021)   Pt will be compliant and independent with HEP in order to maintain progress made in physical therapy.  To be initiated     Pt will ambulate household distances without AD and no more than supervision in order to access home environment more independently.  Mod-I with RW; CGA-minA without AD         LongTerm Goals: to be met by time of discharge Initial status (11/25/2021)   Pt will improve LE MMT to 5/5 in order to improve strength needed for functional tasks, body mechanics, and overall symptoms.  Hip flexion: Left: 4+/5 Right: 4+/5  Hip abduction: Left: 4/5 Right: 4+/5  Hip extension: Left: 3+/5 Right: 3+/5   Pt will perform Timed Up and Go in <12 secs, without AD and with independence, in order to demonstrate decreased fall risk and appropriate safety and  independence with functional mobility.  10.81 with mod-I and RW  17.1 without AD, CGA   Pt will improve distance on 6 Minute Walk Test by at least 164 ft, ambulating without AD and no more than SPV, in order to achieve MCID/MDC, improve endurance, and improve community mobility.  1032' without AD, minA   Pt will participate in one standardized balance/gait measure in order to assess fall risk and community mobility. Goal to be made accordingly.  To be initiated    12/06/2021: GOAL MET   NEW GOAL 12/06/2021: Pt will improve score on Berg Balance Scale by >/= 5 points in order to achieve MCID, improve balance, and decrease fall risk.  Initial 12/06/2021: 40/56   NEW GOAL 12/06/2021: Pt will score >22/30 on Functional Gait Assessment  in order to decrease fall risk and improve dynamic balance and ambulation.   Initial: 14/30     ______________________________________________________________________  Pain:0/10 Location: Neuropathic pain                        Precautions: Cervical surgery, fall risk, orthostasis     Activity Date: 11/25/2021 Date: 11/29/2021 Date: 12/03/2021 Date: 12/06/2021    Visit # Eval, 1 2 3 4    Checked for POC Certification  Yes       Verified Signed POC:    YES[x]       NO[]            Date:  Attestation signed by Hurshel Keys, MD at 11/25/2021  4:01 PM  Rerouted to Referral Source for signature via: EMR[]   Fax[]    Mail[]       Date:  Routed to PCP (if other than referring) for signature via: EMR[]   Fax[]     Mail[]      Date:  Was POC updated?        YES[]         NO[]                            Date:        If yes, Verified Signed POC:      YES[]             NO[]            Date:      CPT code  Daily treatment record Timed Code Treatment Minutes     Neuromuscular Re-education: [phone] BERG BALANCE SCALE       12/06/2021     2:27 PM   Berg Balance Test   Sitting to standing 4   Standing unsupported 4   Sitting with back unsupported but feet supported on floor or on a stool 4   Standing to sitting 4    Transfers 4   Standing unsupported with eyes closed 3   Standing unsupported with feet together 3   Reaching forward with outstretched arm while standing 4   Pick up object from the floor from a standing position 3   Turning to look behind over left and right shoulders while standing 1   Turn 360 degrees 1   Placing alternate foot on a step on stool while standing 1   Standing unsupported one foot in front 3   Standing on one foot/leg 1   Total Score 40     Discussed fall indications with Berg score and importance of continuing to use AD. Reviewed balance HEP to progress trunk rotations, narrow and staggered stances.         16     Therapeutic Exercise: 97110   The following were performed to address spinal and hip mobility and ROM needed for upright posture:  - Long duration pec stretch over towel roll  - Attempted standing hip flexor lunge stretch but pt unable to coordinate/perform safely and effectively  - Thomas test position hip flexor stretch 14     Gait Training: 60454   Gait training performed with pt's personal hurrycane x 500'. Performed with CGA and intermittent HOH assist for sequencing of cane. One instance of scissoring but pt able to self correct. Fair step length and speed but continues to demo flexed posture and anterior pitch. Educated on practicing with hurrycane only in the home environment with wife supervising and use of gait  belt.      Pt then performed the following with CGA to address step length, foot clearance, foot placement, and multi-directional gait:  - Step-to gait over hurdles  - Reciprocal gait over hurdles  - Sidestepping over hurdles  - 4-square step test x10 ea direction 35   Response to Treatment Pt's score of 40/56 on Sharlene Motts continues to increase fall risk (<45/56). Educated on continuing to use AD. May progress to cane if deemed safe. Pt continues to tend towards anterior LOB d/t difficulty achieving thoracic and hip extension.      Total timed coded treatment minutes:  55     Total treatment time (timed + untimed):  55     Plan for Next Visit: Gait training without device, multi-directional stepping and gait. Functional strengthening and balance HEP. Stretches as indicated.     Additional Information:   12/06/2021: Pt reported he was able to use the stationary bike over the weekend and walked laps.     Patient Education:   12/06/2021: Educated on [place] results and safe use of hurrycane. Added pec stretch over towel roll and Thomas test to HEP.   12/03/2021: LTR and book openers added to HEP. Educated on continuing to use AD.   11/29/2021: Discussed fall risk indicators with FGA score and importance of continuing to use AD. Discussed safe use of recumbent bike at home gym and how to slowly increase tolerance and time. Discussed performing as neural priming prior to walking practice at home. HEP: SL bridge, narrow stance with head turns/nods, staggered stance, fwd/bwd/side step in place.   11/25/2021: Pt educated on PT initial evaluation findings, PT plan of care, and follow-up treatment. Pt verbalized understanding. Educated on continuing to build walking endurance and aerobic conditioning with laps with RW. HEP including SL bridge for hip strengthening; handout provided. Discussed future safe gym interventions.     This note serves as a discharge summary if the patient does not return to Physical Therapy per the above POC.

## 2021-12-06 NOTE — Unmapped (Signed)
OUTPATIENT SPEECH LANGUAGE PATHOLOGY  DAILY NOTE    Name:Matthew [name] DOB:28-Feb-1945 ZOX:[id number]  Today's Date: 12/06/2021  Referring Provider: Lorne Skeens  [address] Neurology   [facility] [address]     Primary Diagnosis:76 y/o male s/p C3-4 ACDF on 9/19 complicated by intraoperative CSF leak presents with progressive R neck swelling concerning for continued CSF leak. 9/30: underwent CSF leak repair and neck exploration.   Onset Date: 10/19/2021 first surgery with revision on  10/29/2021  Diagnosis:dysphagia, memory complaints  Insurance Plan:Payor: MEDICARE / Plan: MEDICARE A AND B / Product Type: Medicare /   # visits per insurance authorization:   11/23/2021/TDC [id number] AUTH INFO   PT,OT, ST -PLAN: MEDICARE PRIMARY  DRAKE IS IN NETWORK  PT & ST GET $2230 COMBINED PCY USED- $1266.95 POSSIBLE 12 VISITS REMAINING  OT GETS $2230 PCY USED - $75.57 POSSIBLE 26 VISITS REMAINING  CO-INS : 80%  Date of initial eval: 11/25/2021  # visits per POC: 10  Date of initial POC Certification: 11/25/2021  Verified Signed POC Certification:  Yes: x  on 11/26/2021     Date of POC Recertification: n/a  Verified Signed POC Recertification: 12/03/2021   Date of Progress Report (every 10 visits):     POC Recertification due(Medicaid 60 days/Medicare 90 days): 02/25/2022      Goals:  Goal 11/29/2021 12/03/2021 12/06/2021     Complete formal cognitive assessment and add goals as needed.  Completed, see results below.  Will determine need for adding goals per discussion with pt. Discussed results.  Despite some scores in low average range it was determined to not purse cognitive goals at this time. MET     Complete MBS and add goals as needed.  Scheduled for 11/30/2021 at 10:30 Completed. MET      Pt will complete swallow exercises focusing on pharyngeal /laryngeal strengthening (hyolarygneal elevation/ excursion, BOT, and pharyngeal stripping) with 100% accuracy as HEP.  New goal Effortful swallow x10, Masako  x10, Mendelsohn x10, and CTAR (spring device with red) x10     Pt will complete MBS in 2-3 wks  To be scheduled To be scheduled     Pt will demonstrate swallow strategies independently  Demonstrated small sips and throat clear Discussed     Patient/caregivers will demonstrate understanding of role of speech therapy diagnosis, prognosis, home exercise programs, plan of care, recommendations, and safety precautions. ongoing ongoing ongoing                                       ______________________________________________________________________  Visit #: 3 Charges: 92526 and 84132 x 1 Total Minutes: 40    Pt arrived unaccompanied. Pt reports completing swallow exercises about 2 times per day. Did not report difficulty or pain with eating/drinking.    Tested lingual strength with IOPI. Max for posterior position was measured at 40 kPa today. Max for anterior position was measured at 45 kPa today. Average for pt's age is 57 kPa.       Repeat all exercises 10 times/ 3 times/day.      Effortful swallow: Swallow as hard as you can like you are swallowing a golf ball    Masako: Bite your tongue while your swallowing. Do not do this with food or drink in your mouth.  You will choke.     Mendelsohn Maneuver. Hold your Adams apple, swallow and hold your swallow at the highest peak  Push the tongue bulb on the roof of your mouth (front) and hold there for a count of 5    Push the tongue bulb on the roof of your mouth (middle/back) and hold there for a count of 5      Education Addressed During This Session: Importance of swallow exercises, plan for continued therapy.     Plan:  2x/week x 5 weeks       This note serves as a discharge summary if the patient does not return to Speech Therapy per the above POC.    Marcello Fennel - Student Clinician   SNF Speech Language Pathologist   [facility]  [address]  [place] [address]    Supervisor:   Cherie Ouch, MS, CCC/SLP  SNF Speech  Language Pathologist  [facility]  [address]  [place] [address]  Phone: [phone]  Fax: [phone]  [email or web address]

## 2021-12-08 ENCOUNTER — Ambulatory Visit: Admit: 2021-12-08 | Discharge: 2021-12-12 | Payer: MEDICARE

## 2021-12-08 DIAGNOSIS — Z981 Arthrodesis status: Secondary | ICD-10-CM

## 2021-12-08 NOTE — Unmapped (Signed)
Neurosurgery Clinic Visit    Reason for Visit / Chief Complaint:   Chief Complaint   Patient presents with    Follow-up     MRI in pacs       History of Present Illness: Jestin Burbach is a 76 y.o. 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 He presents with continued loss of balance and pain in left neck & shoulder.  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  He reports improved shoulder strength in his shoulder but continued loss of balance.  He does report improved eating and slowly improvement of his dysphagia.      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    Environmental allergies     Hearing loss     left (since age 88), right more recent    High cholesterol     Hypertension     Neck symptom     Trouble turning head to the left     Sleep apnea     Sleep disorder        Past Surgical History:   Procedure Laterality Date    ACHILLES TENDON REPAIR  01/31/2002    left     APPENDECTOMY      HERNIA REPAIR  02/01/1951    INCISION AND DRAINAGE POSTERIOR LUMBAR SPINE N/A 10/30/2021    Procedure: possible lumbar drain;  Surgeon: Tempie Hoist, MD;  Location: [facility] OR;  Service: Neurosurgery;  Laterality: N/A;    IRRIGATION AND DEBRIDEMENT SPINE N/A 10/30/2021    Procedure: CSF leak repair, exploration of the neck;  Surgeon: Tempie Hoist, MD;  Location: [facility] OR;  Service: Neurosurgery;  Laterality: N/A;    KNEE SURGERY Left 1976    SPINE SURGERY      TONSILLECTOMY      WISDOM TOOTH EXTRACTION         Social History     Socioeconomic History    Marital status: Married     Spouse name: Not on file    Number of children: Not on file    Years of education: Not on file     Highest education level: Not on file   Occupational History    Not on file   Tobacco Use    Smoking status: Never    Smokeless tobacco: Never   Vaping Use    Vaping Use: Never used   Substance and Sexual Activity    Alcohol use: Yes     Alcohol/week: 4.0 standard drinks     Types: 2 Glasses of wine, 2 Cans of beer per week    Drug use: No     Comment: 02/16/10    Sexual activity: Not on file   Other Topics Concern    Caffeine Use Yes    Occupational Exposure Not Asked    Exercise Yes    Seat Belt Yes   Social History Narrative    Not on file     Social Determinants of Health     Financial Resource Strain: Not on file   Physical Activity: Not on file   Stress: Not on file   Social  Connections: Not on file   Housing Stability: Not on file       The patient has a family history of   Family History   Problem Relation Age of Onset    Colon Cancer Mother     Heart failure Father        History from paper and electronic chart reviewed. Patient has no additional relevant past medical, past surgical, family, or social history.    Medications:  Current Outpatient Medications   Medication Sig    acetaminophen Take 2 tablets (650 mg total) by mouth every 6 hours.    albuterol Inhale 2 puffs into the lungs every 6 hours as needed for Wheezing or Shortness of Breath.    ALPHA LIPOIC ACID ORAL Take 1 capsule by mouth daily.    amLODIPine Take 1 tablet (5 mg total) by mouth daily.    azelastine Use 2 sprays into each nostril 2 times a day.    baclofen Take 1 tablet (10 mg total) by mouth daily as needed (Muscle spasms).    bimatoprost Place 1 drop into both eyes daily.    brimonidine Place 1 drop into the right eye 2 times a day.    cetirizine Take 1 tablet (10 mg total) by mouth daily.    doxepin Take 1 capsule (10 mg total) by mouth at bedtime as needed.    DULoxetine Take 1 capsule (60 mg total) by mouth daily.    finasteride Take 0.5 mg by mouth daily.    fluticasone propionate Use 1 spray into each nostril daily.    gabapentin  Take 2 capsules (200 mg total) by mouth 3 times a day.    melatonin Take 3 tablets (9 mg total) by mouth at bedtime.    methocarbamoL Take 1 tablet (500 mg total) by mouth 3 times a day as needed.    naloxone Apply 1 spray in one nostril if needed. Call 911. May repeat dose in other nostril if no response in 3 minutes.    oxyCODONE Take 1 tablet (5 mg total) by mouth every 4 hours as needed for Pain for up to 7 days.    pregabalin Take 1 capsule (150 mg total) by mouth 2 times a day.    QUEtiapine Take 1 tablet (25 mg total) by mouth at bedtime.    rosuvastatin Take 1 tablet (10 mg total) by mouth every evening.    senna-docusate Take 1 tablet by mouth 2 times a day.    tadalafiL Take 1 tablet (20 mg total) by mouth daily as needed for Erectile Dysfunction.    timolol Place 1 drop into both eyes every 12 hours.     No current facility-administered medications for this visit.       Medications from paper and electronic chart reviewed.  Patient has no additional pertinent medications    Physical Exam:    Vitals:    12/08/21 1301   BP: 138/82   BP Location: Right upper arm   Patient Position: Sitting   BP Cuff Size: Regular   Pulse: 54   Temp: 96.1 F (35.6 C)   TempSrc: Oral   SpO2: 98%   Weight: 169 lb (76.7 kg)   Height: 5' 11 (1.803 m)       Physical Exam:  Vitals:  Vitals:    12/08/21 1301   BP: 138/82   Pulse: 54   Temp: 96.1 F (35.6 C)   SpO2: 98%       General:  in no apparent distress and well developed and well nourished; appears stated age  HENT:   NC/AT, external ears normal to inspection, hearing normal  Eye:   normal conjunctiva/cornea, no scleral icterus, no injection, EOM intact, PERRL  Pulm:  no respiratory distress   CV:   No pedal edema  MSK:   Normal muscle bulk with no contractures or deformities    Neurologic Exam:  Mental Status: Alert/Oriented to person, place and day  Speech: Normal, Answers questions appropriately  Cranial Nerves: Face symmetric, tongue midline, hearing & vision grossly  intact   Memory:  recent and remote memory intact    Motor: Moving all extremities spontaneously, full ROM, symmetric and full strength in all extremities  Sensation  Sensation intact to light touch in C5-T1 dermatomes  Sensation intact to light touch in L2-S1 dermatomes    Gait: Spastic myelopathic gait; with walker                 Imaging reviewed:   MRI Cervical spine W WO contrast  Narrative: EXAM: MRI CERVICAL SPINE W WO CONTRAST    INDICATION: Pain, s/p fusion    TECHNIQUE:  MRI CERVICAL SPINE W AND WO CONTRAST performed including multiplanar T1 and T2 weighted sequences. Imaging was obtained prior to and after 7.5 mL of GADOBUTROL 1 MMOL/ML INTRAVENOUS SOLUTION [facility]) administered intravenously.    COMPARISON: Radiographs from 12/01/2021, cervical spine MRI from 08/17/2021    FINDINGS:    Adequate diagnostic quality.    Alignment: Trace retrolisthesis of C3 on C4.    Osseous structures: Postsurgical changes related to C3-C4 ACDF. T2/STIR hyperintense signal within the C3-C4 disc space. Vertebral body height is overall preserved. No evidence of endplate erosions. There are flowing bulky ventral osteophytes spanning C4-T2. No suspicious enhancing marrow lesion.    Spinal cord: No definite spinal cord signal abnormality. No abnormal enhancement.    Individual disc space levels:    C2-3: No canal stenosis. Mild bilateral facet arthrosis. Moderate left foraminal stenosis.    C3-4: Postoperative changes from prior anterior discectomy. Nonenhancing material extending posteriorly from the disc space. There is low signal along the posterior periphery greatest inferiorly (series 5 image 8) which likely correlates to calcification of the posterior longitudinal ligament visualized on prior CT of the neck from 11/01/2021. Posterior arch hypertrophy. Severe central canal stenosis with severe spinal cord compression, similar to prior MRI. Uncovertebral hypertrophy and facet arthropathy contributes to severe left and mild right  foraminal stenosis.    C4-5: Central disc osteophyte complex with calcification of the posterior longitudinal ligament. Moderate spinal canal stenosis without spinal cord compression. Uncovertebral hypertrophy and facet arthropathy. Moderate left and mild right foraminal stenosis.    C5-6: Desiccation posterior longitudinal ligament. No significant canal stenosis. Left greater than right uncovertebral hypertrophy. Mild to moderate left and mild right foraminal stenosis.    C6-7: Small disc bulge. No significant canal stenosis. Uncovertebral hypertrophy and facet arthropathy. No significant foraminal stenosis.    C7-T1: Mild disc bulge. No significant calcinosis. Bilateral uncovertebral hypertrophy and mild facet arthropathy. No significant foraminal stenosis.    Extraspinal structures: Bulky osteophyte at T1-T2 indents Prevertebral soft tissues/esophagus. Small T2 hyperintense fluid collection measuring 2.9 x 0.5 cm in axial dimension (series 7 image 27) extending from the level of ACDF in the right prevertebral soft tissues towards the skin surface anteriorly along the medial aspect of the sternal cleidomastoid muscle decrease in size compared to the prior CT the neck from 11/01/2021.  Impression: IMPRESSION:  1.  Postsurgical changes related to prior C3-C4 ACDF. Hyperintense signal at the operative disc space is favored postsurgical.  2.  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  3.  Findings in keeping with DISH (diffuse intrahepatic skeletal hyperostosis).  4.  Decreased size of small prevertebral fluid collection extending from the C3-C4 ACDF operative site.  5.  Additional multilevel cervical degenerative disc disease and spondylosis, not significantly changed compared to the prior cervical spine MRI from 08/17/2021.    Report Verified by: Ezzie Dural, DO at 12/02/2021 9:26 AM EDT      See HPI for  MRI review     Assessment  Xhaiden Coombs 76 y.o. male with Bonny was seen today for follow-up.    Diagnoses and all orders for this visit:    S/P cervical spinal fusion    Cervical spondylosis with myelopathy    Cervical stenosis of spine          1. S/P cervical spinal fusion        2. Cervical spondylosis with myelopathy        3. Cervical stenosis of spine              Plan  Nadir Vasques is a 76 y.o. 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 He presents with continued loss of balance and pain in left neck & shoulder.  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  He reports improved shoulder strength in his shoulder but continued loss of balance.  He does report improved eating and slowly improvement of his dysphagia.    In summary pt continues to have symptoms of cervical myelopathy in the setting of continued cervical stenosis s/p ACDF.  The patient has improved swallowing difficulty but continued gait abnormalities.    Discussed pt with Dr. Sena Hitch and he is planing for surgical revision in coming months.   Portions of this clinic note was copied forward from my last progress note. I have reviewed and updated the history, physical exam, data, assessment and plan of the note so it reflects the evaluation and management of the patient on today's date   Today's body mass index is 23.57 kg/m. BMI is normal.    I spent a total of 30 minutes obtaining a history, examining, and coordinating care for this patient.  This time spent includes the details in my assessment and plan above and also the following activities performed on the same day as the encounter: preparing to see the patient (eg, review of tests), ordering medications, tests, or procedures,  referring and communicating with other health care professionals , documenting clinical information in the electronic or other health record, independently interpreting results and communicating results to the patient/family/caregiver, and providing care coordination      Pt was seen concurrence with Dr. Sena Hitch.     Thank You    Derrill Center, CNP  12/08/2021  [facility]  [facility] AT [facility]  [address]  [place] [address]  Dept: [phone]  Loc: [phone]

## 2021-12-08 NOTE — Unmapped (Signed)
An After Visit Summary was printed and given to the patient.    Matthew [name] will reach out regarding surgery

## 2021-12-10 ENCOUNTER — Encounter: Admit: 2021-12-10 | Discharge: 2021-12-10 | Payer: MEDICARE

## 2021-12-10 DIAGNOSIS — R1312 Dysphagia, oropharyngeal phase: Secondary | ICD-10-CM

## 2021-12-10 DIAGNOSIS — M4812 Ankylosing hyperostosis [Forestier], cervical region: Secondary | ICD-10-CM

## 2021-12-10 DIAGNOSIS — Z789 Other specified health status: Secondary | ICD-10-CM

## 2021-12-10 MED ORDER — lidocaine (PF) 2% (20 mg/mL) Soln 20 mg
20 | Freq: Once | INTRAMUSCULAR | Status: AC | PRN
Start: 2021-12-10 — End: 2021-12-10

## 2021-12-10 NOTE — Unmapped (Signed)
OUTPATIENT SPEECH LANGUAGE PATHOLOGY  DAILY NOTE    Name:Matthew [name] DOB:12/13/1945 ZOX:[id number]  Today's Date: 12/10/2021  Referring Provider: Lorne Skeens  [address] Neurology   [facility] [address]     Primary Diagnosis:76 y/o male s/p C3-4 ACDF on 9/19 complicated by intraoperative CSF leak presents with progressive R neck swelling concerning for continued CSF leak. 9/30: underwent CSF leak repair and neck exploration.   Onset Date: 10/19/2021 first surgery with revision on  10/29/2021  Diagnosis:dysphagia, memory complaints  Insurance Plan:Payor: MEDICARE / Plan: MEDICARE A AND B / Product Type: Medicare /   # visits per insurance authorization:   11/23/2021/T[facility] [id number] AUTH INFO   PT,OT, ST -PLAN: MEDICARE PRIMARY  DRAKE IS IN NETWORK  PT & ST GET $2230 COMBINED PCY USED- $1266.95 POSSIBLE 12 VISITS REMAINING  OT GETS $2230 PCY USED - $75.57 POSSIBLE 26 VISITS REMAINING  CO-INS : 80%  Date of initial eval: 11/25/2021  # visits per POC: 10  Date of initial POC Certification: 11/25/2021  Verified Signed POC Certification:  Yes: x  on 11/26/2021     Date of POC Recertification: n/a  Verified Signed POC Recertification: 12/03/2021   Date of Progress Report (every 10 visits):     POC Recertification due(Medicaid 60 days/Medicare 90 days): 02/25/2022      Goals:  Goal 11/29/2021 12/03/2021 12/06/2021 11/10    Complete MBS and add goals as needed.  Scheduled for 11/30/2021 at 10:30 Completed. MET Repeat scheduled for 11/28     Pt will complete swallow exercises focusing on pharyngeal /laryngeal strengthening (hyolarygneal elevation/ excursion, BOT, and pharyngeal stripping) with 100% accuracy as HEP.  New goal Effortful swallow x10, Masako x10, Mendelsohn x10, and CTAR (spring device with red) x10 Effortful using CTAR device x 10 x 2 trials  IOPI: Held target of 75% of max for 2-3 sec x 10 trials    Pt will complete MBS in 2-3 wks  To be scheduled To be scheduled 11/28    Pt will  demonstrate swallow strategies independently  Demonstrated small sips and throat clear Discussed Met    Patient/caregivers will demonstrate understanding of role of speech therapy diagnosis, prognosis, home exercise programs, plan of care, recommendations, and safety precautions. ongoing ongoing ongoing ongoing                                      ______________________________________________________________________  Visit #: 3 Charges: 92526  Total Minutes: 30    Pt arrived unaccompanied.  Did not report difficulty or pain with eating/drinking.    Tested lingual strength with IOPI. Max for posterior position was measured at 43 kPa today. Max for anterior position was measured at 46 kPa today. Average for pt's age is 57 kPa.       Repeat all exercises 10 times/ 3 times/day.      Effortful swallow: Swallow as hard as you can like you are swallowing a golf ball while pushing down on a ball (CTAR).       Push the tongue bulb on the roof of your mouth (front) and hold there for a count of 5    Push the tongue bulb on the roof of your mouth (middle/back) and hold there for a count of 5      Education Addressed During This Session: Importance of swallow exercises, plan for continued therapy.     Plan:  2x/week x 5 weeks  This note serves as a discharge summary if the patient does not return to Speech Therapy per the above POC.    Deetta Perla, MA, CCC/SLP [phone]  Team Leader, Speech Pathology  Interpreter Services Coordinator  [facility]  t [phone]  [email or web address]

## 2021-12-10 NOTE — Unmapped (Signed)
Addended by: Warden Fillers on: 12/10/2021 04:08 PM     Modules accepted: Orders

## 2021-12-10 NOTE — Unmapped (Signed)
Occupational Therapy Treatment Encounter Note  Name: Matthew [name]  Date of Birth: Oct 20, 1945  MRN: [id number]    Referring Physician: Hurshel Keys, MD  [address]  Neurology  [place],  [place] [address] Phone: [phone]  Fax: [phone]    Treatment Diagnosis: Impaired IADL's    Current Encounter History: History and Reason for Referral:: Matthew [name] is a 76 y.o. 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 Pt with PMHx including cervical dystonia, cerebellar ataxia, and recent C3-4 ACDF coomplicated by CSF leak with lumbar drain placement on 10/29/21. Pt discharged from hospital to IPR and has been home for ~2 weeks per pt/spouse report. At baseline pt stating IND with all I/ADLs and mobility without AD. He enjoyed regular exercises and playing tennis. Currently pt is using RW for functional mobility and is completing ADLs at a seated vs standing level due to LE fatigue. 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 Pt would benefit from OP OT at this time to address the above mentioned deficits to increase function and improve upon quality of life.     DATE OF INITIAL OT EVALUATION :10/26  VERIFIED SIGNED POC:  [x]  yes; DATE:      Cosigned by: Hurshel Keys, MD at 11/26/2021  9:16 AM       Attestation signed by Hurshel Keys, MD at 11/26/2021  9:16 AM     Electronically Signed By:  Lorne Skeens, MD  Physical Medicine and Rehabilitation  11/26/2021 9:16 AM                 Activity Date:  10/30  Date:   11/03 Date:  11/06  Date:  11/101  Date:  Date:  Date:  Date:  Date:     Visit # 2 3 4 5 6 7 8 9 10    Checked for POC Certification                     Insurance: Payor: MEDICARE / Plan: MEDICARE A AND B / Product Type: Medicare  /   Therapy Goals:     Short Term Goals to be met within 4 sessions INITIAL STATUS REASSESSMENT:  DATE:   1. Patient will demonstrate independence with progressive HEP for [facility] and energy conservation strategies to address functional deficits with continued progress after discharge.     Initiated hand coordination HEP during intitial eval, would benefit from reinforcement []  PROGRESS MADE/        CONTINUE GOAL  []  GOAL MET  []  DC GOAL   Long Term Goals to be met within 8 sessions:  INITIAL STATUS REASSESSMENT:   Pt will be educated on AE for dressing to maximize IND and improve QOL.   Initiated education on elastic shoe laces and button hook at initial eval [x]  PROGRESS MADE/        CONTINUE GOAL  []  GOAL MET  []  DC GOAL     2. Pt will tolerate >20 mins of functional mobility while completing simple IADL task to demonstrate improve endurance  Reports fatigue, per patient report is transitioning out of RW []  PROGRESS MADE/        CONTINUE GOAL  []  GOAL MET  []  DC GOAL     3. Patient will decrease time on  9 hole peg test by at least 5 seconds with the B UE in order to increase independence with clothing management.     Right hand: 47 seconds    Left hand: 43 seconds []  PROGRESS MADE/        CONTINUE GOAL  []  GOAL MET  []  DC GOAL     4. Pt will be educated on accessibility features to improve usage. Limited by neuropathy, states texting is very frustrating, decreased accuracy   []  PROGRESS MADE/        CONTINUE GOAL  []  GOAL MET  []  DC GOAL     5. Patient will complete cognitive and visual perceptual assessments in order to further assess safety with community mobility.     Is moving, would like to complete screening processes for returned to driving prior to upcoming move at end of november []  PROGRESS MADE/        CONTINUE GOAL  []  GOAL MET  []  DC GOAL       Pain:0/10 Location:                            Precautions:none  Subjective:   Date:   12/10/21: Pt is doing well. States potential plan for surgery on  11/30  12/06/21: States he wants to extend OT visits through end of the year until he moves to [place]. Verbalized concerns over balance, activity stamina, cognition.  12/03/21: ADDENDUM: Pt states he is delaying move to [place] to January. Pt stating he would like to work on dynamic standing balance this date. Reports wife purchased [facility] activities/games that he has been using at home  11/29/2021 : States he is moving at end of November to [place]. Is frustrated with physical and cognitive decline since surgeries.     Treatment Session:     CPT code SKILLED INTERVENTIONS AND EDUCATION: Timed Code Treatment Minutes:    therapeutic activity Engaged pt in item retrieval task in ADL suite from high/low surface without AD. Slight LOB when navigating turns to L and with anterior weight shifts to reach towards low items. Cues provided for pacing. OT provided education on fall prevention strategies and importance widening BOS during functional reach for more stability. Pt verbalized understanding    Pt participated in simulated laundry task for dynamic standing balance without AD. Pt tolerated well. Slight LOB during lateral weight shift to left with addition of trunk rotation. Able to self correct and adjust BLE positioning without cues.    15 mins   therapeutic exercise In stance at table top with blue foam balance mat engaged pt in the following exercises to address dynamic stance and trunk stability required for IADL management:    -chest press and horizontal ab/adduction with while balancing horizontal dowel rod on medium sized ball on table top. Hands positioned slightly greater than shoulder width apart.   2x10 in static stance  2x10 with staggered stance with L/R elevated on blue foam mat+anterior/posterior weight shift  2x10 lateral stance with L/RLE elevated on blue foam mat+lateral weight shift  Note: Increased difficulty when LLE elevated on blue foam mat and lateral WS to left. Up to min A to correct LOB. Rest  breaks provided     -with 5lb bean bag 2x10 reps around the worlds around waist and above head to simulate balance and UE ROM required for ADLs. Initial anterior lean with patient able to correct with supv   30 mins  self-care/home management Pt requesting assist for accessibility features on phone. Reported deficits: Increased time typing, decreased accuracy, difficulty navigating phone due to sensory deficits vs cog. OT made the following accessibility changes:  -intro to voice command  -control center button  -siri for calls/texting  -keyboards setting: Key repeat (interval set to .10, delay set to .40, sticky keys, slow keys (.40 seconds)    Note: Pt able to trial voice command function and siri to send message to spouse. After instruction pt able to complete with supv. Pt also able to navigate through phone using voice command. Pt trialed keyboard after adjustments made, reports less errors and more efficient use. Pt appreciative of education provided.     Patient reporting spouse purchased button hook, has been using with good results for UB dressing tasks. 15 mins          Timed Treatment TIme:  60    Total Treatment TIme: 4360     Patient Education:   Date:   12/10/21: balance training, IADL retraining, phone accessibility  12/06/21: stamina, balance and cognitive retraining  12/03/21: dynamic standing required for household task and return to leisure activity  11/29/2021' return to drivingprocess  Education to: [x]  Patient []  Significant other   []  Caregiver []  other   Method: [x]  Explanation []  Demonstration   []  Handouts []  other   Response: [x]  Verbalized understanding []  Demonstrated understanding [x]  Needs reinforcement []  other     Plan:   Simulated IADL to address dynamic standing balance    This note serves as a discharge summary if the patient does not return to OCCUPATIONAL THERAPY per the above POC.       Therapist Signature: Antonieta Ibaassidy Brown, OTR/L  Date: 12/10/2021

## 2021-12-10 NOTE — Unmapped (Signed)
12/10/21 0005   Pre-op Phone Call   Surgery Time Verified Yes  (12/30/21 at 1430)   Arrival Time Verified 1230   Surgery Location Verified Yes  [facility])   Remind patient to bring picture ID and insurance card Yes   Medical History Reviewed Yes   NPO Status Reinforced Yes  (NPO 8 hours prior to arrival.)   Ride and Caregiver Arranged Yes   Ride Caregiver Provider Axe,Therese (Spouse)  [phone] (Home Phone)   Instructions to bring current medication list Yes     Reviewed Pre-Op instructions with patient and spouse No hx of problems with anesthesia.     Reviewed fasting guidelines.Avoid NSAIDS including Advil, Ibuprofen, Aleve, or Naproxen 7 days prior to surgery or if less than 7 days, hold from now until procedure. Do not take a Multivitamin, Vitamin E, or Herbal Supplements two weeks prior to surgery (If the surgery date is less than 2 weeks, please stop taking from now until the surgery date).     Reviewed med list. Instructed to take Amlodipine, Cymbalta, Lyrica, and if needed APAP, Methocarbamol with small sips of water the morning of surgery. Advised to check with prescribing doctor for instructions on holding aspirin prior to procedure.      Reviewed instructions of chloraprep shower daily x 5 days prior to surgery and the morning of surgery. Do not apply to to face/genital area. Apply quarter size to skin after rinsing of in shower. Leave on for 2 minutes and rinse off. Do not apply any lotion, powder, deodorant, makeup, jewelry the morning of the procedure.     [facility] RN visit scheduled 10/14/21 at 1100. Message to Dr. Waunita Schooner office for H&P plan.     Patient and spouse voiced understanding of all instructions.

## 2021-12-10 NOTE — Unmapped (Signed)
PT DAILY TREATMENT NOTE    Diagnosis:   1. Ankylosing hyperostosis (forestier), cervical region  AMB Follow-up to Physical Therapy      2. Encounter for other orthopedic aftercare  AMB Follow-up to Physical Therapy      3. Impaired functional mobility, balance, gait, and endurance  AMB Follow-up to Physical Therapy      4. Muscle weakness  AMB Follow-up to Physical Therapy              Referring Provider:Zeldin, Floyce Stakes, MD    Insurance plan:   Payer/Plan Subscr DOB Sex Relation Sub. Ins. ID Effective Group Num   1. MEDICARE - MECHEYNE, BOULDEN* 1945/08/10 Male Self [id number] 10/02/11                                    [address]   2. Kristin Bruins Jenison* 07/30/1945 Male Self [id number] 10/01/13                                    [address]                         # of visits per insurance authorization:   PT,OT, ST -PLAN: MEDICARE PRIMARY  DRAKE IS IN NETWORK     PT OT SP-  PATIENT HAS AARP SUPPLEMENT INS SECONDARY  ONLY PAYS IF MEDICARE PAYS    # of visits per POC: Eval + 12 visits    Date of Initial Eval: 11/25/2021    Date of Last Progress Report: NA    Therapy Goals:   Short Term Goals: to be met by 2 weeks  Initial status (11/25/2021)   Pt will be compliant and independent with HEP in order to maintain progress made in physical therapy.  To be initiated     Pt will ambulate household distances without AD and no more than supervision in order to access home environment more independently.  Mod-I with RW; CGA-minA without AD         LongTerm Goals: to be met by time of discharge Initial status (11/25/2021)   Pt will improve LE MMT to 5/5 in order to improve strength needed for functional tasks, body mechanics, and overall symptoms.  Hip flexion: Left: 4+/5 Right: 4+/5  Hip abduction: Left: 4/5 Right: 4+/5  Hip extension: Left: 3+/5 Right: 3+/5   Pt will perform Timed Up and Go in <12 secs, without AD and with independence, in order to demonstrate decreased fall risk and appropriate safety and  independence with functional mobility.  10.81 with mod-I and RW  17.1 without AD, CGA   Pt will improve distance on 6 Minute Walk Test by at least 164 ft, ambulating without AD and no more than SPV, in order to achieve MCID/MDC, improve endurance, and improve community mobility.  1032' without AD, minA   Pt will participate in one standardized balance/gait measure in order to assess fall risk and community mobility. Goal to be made accordingly.  To be initiated    12/06/2021: GOAL MET   NEW GOAL 12/06/2021: Pt will improve score on Berg Balance Scale by >/= 5 points in order to achieve MCID, improve balance, and decrease fall risk.  Initial 12/06/2021: 40/56   NEW GOAL 12/06/2021: Pt will score >22/30 on Functional Gait Assessment  in order to decrease fall risk and improve dynamic balance and ambulation.   Initial: 14/30     ______________________________________________________________________  Pain:0/10 Location: Neuropathic pain                        Precautions: Cervical surgery, fall risk, orthostasis     Activity Date: 11/25/2021 Date: 11/29/2021 Date: 12/03/2021 Date: 12/06/2021 12/10/2021    Visit # Eval, 1 2 3 4 5    Checked for POC Certification  Yes        Verified Signed POC:    YES[x]       NO[]            Date:  Attestation signed by Hurshel Keys, MD at 11/25/2021  4:01 PM  Rerouted to Referral Source for signature via: EMR[]   Fax[]    Mail[]       Date:  Routed to PCP (if other than referring) for signature via: EMR[]   Fax[]     Mail[]      Date:  Was POC updated?        YES[]         NO[]                            Date:        If yes, Verified Signed POC:      YES[]             NO[]            Date:      CPT code  Daily treatment record Timed Code Treatment Minutes     Neuromuscular Re-education: [phone] Extensive time spent this session educating pt and pt's wife on the following: neuroanatomy; spinal cord and spinal tracts; motor coordination, timing, and fall risk; surgery and rehab indications; PT POC and  goals.     HIIT performed on recumbent bike x 9 mins for neural priming, aerobic conditioning, reciprocal patterning, and muscular strengthening. Warmup performed @ Level 3 followed by 1 min, higher speed/resistance intervals @ Level 7 >60 SPM, followed by 1 min lighter intervals for recovery x 3 rounds.  35     Therapeutic Exercise: 97110   Transitioned to mat table and performed the following stretches for low back pain and spinal mobility:  - DKC, SKC, LTRs, Thomas test hip flexor stretch 10     Gait Training: 19147   Agility ladder sequences performed to address multi-directional gait without AD. Performed with CGA and up to minA needed for LOB and scissoring of gait. Challenged by DT cognitive sequencing and with control of foot placement.  12   Response to Treatment Pt and pt's wife were receptive to education provided about upcoming surgery and SCI recovery, though are nervous to undergo surgery again. Pt was appropriately fatigued by activities and struggled more with cognitive dual tasking with gait.      Total timed coded treatment minutes:  57    Total treatment time (timed + untimed):  57     Plan for Next Visit: Gait training without device, multi-directional stepping and gait. Functional strengthening and balance HEP. Stretches as indicated.     Additional Information:   12/10/2021: Pt saw neurosurgeon this week and they are planning for a third surgery on November 30 d/t continued compression. First surgery was 9/19 (was doing well for a week), second was to correct CSF leak, etc on 9/30.    12/06/2021: Pt reported he was able to use  the stationary bike over the weekend and walked laps.     Patient Education:   12/10/2021: Extensive time spent this session educating pt and pt's wife on the following: neuroanatomy; spinal cord and spinal tracts; motor coordination, timing, and fall risk; surgery and rehab indications; PT POC and goals.   12/06/2021: Educated on [place] results and safe use of hurrycane.  Added pec stretch over towel roll and Thomas test to HEP.   12/03/2021: LTR and book openers added to HEP. Educated on continuing to use AD.   11/29/2021: Discussed fall risk indicators with FGA score and importance of continuing to use AD. Discussed safe use of recumbent bike at home gym and how to slowly increase tolerance and time. Discussed performing as neural priming prior to walking practice at home. HEP: SL bridge, narrow stance with head turns/nods, staggered stance, fwd/bwd/side step in place.   11/25/2021: Pt educated on PT initial evaluation findings, PT plan of care, and follow-up treatment. Pt verbalized understanding. Educated on continuing to build walking endurance and aerobic conditioning with laps with RW. HEP including SL bridge for hip strengthening; handout provided. Discussed future safe gym interventions.     This note serves as a discharge summary if the patient does not return to Physical Therapy per the above POC.

## 2021-12-13 ENCOUNTER — Encounter: Admit: 2021-12-13 | Discharge: 2021-12-13 | Payer: MEDICARE

## 2021-12-13 DIAGNOSIS — R1312 Dysphagia, oropharyngeal phase: Secondary | ICD-10-CM

## 2021-12-13 DIAGNOSIS — Z789 Other specified health status: Secondary | ICD-10-CM

## 2021-12-13 DIAGNOSIS — M4812 Ankylosing hyperostosis [Forestier], cervical region: Secondary | ICD-10-CM

## 2021-12-13 NOTE — Unmapped (Signed)
Occupational Therapy Treatment Encounter Note  Name: Matthew [name]  Date of Birth: [birth year]  MRN: [id number]    Referring Physician: Hurshel Keys, MD  [address]  Neurology  [place],  [place] [address] Phone: [phone]  Fax: [phone]    Treatment Diagnosis: Impaired IADL's    Current Encounter History: History and Reason for Referral:: Matthew [name] is a 76 y.o. 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 Pt with PMHx including cervical dystonia, cerebellar ataxia, and recent C3-4 ACDF coomplicated by CSF leak with lumbar drain placement on 10/29/21. Pt discharged from hospital to IPR and has been home for ~2 weeks per pt/spouse report. At baseline pt stating IND with all I/ADLs and mobility without AD. He enjoyed regular exercises and playing tennis. Currently pt is using RW for functional mobility and is completing ADLs at a seated vs standing level due to LE fatigue. 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 Pt would benefit from OP OT at this time to address the above mentioned deficits to increase function and improve upon quality of life.     DATE OF INITIAL OT EVALUATION :10/26  VERIFIED SIGNED POC:  [x]  yes; DATE:      Cosigned by: Hurshel Keys, MD at 11/26/2021  9:16 AM       Attestation signed by Hurshel Keys, MD at 11/26/2021  9:16 AM     Electronically Signed By:  Lorne Skeens, MD  Physical Medicine and Rehabilitation  11/26/2021 9:16 AM                 Activity Date:  10/30  Date:   11/03 Date:  11/06  Date:  11/10  Date:  11/13  Date:  Date:  Date:  Date:     Visit # 2 3 4 5 6 7 8 9 10    Checked for POC Certification                     Insurance: Payor: MEDICARE / Plan: MEDICARE A AND B / Product Type:  Medicare /   Therapy Goals:     Short Term Goals to be met within 4 sessions INITIAL STATUS REASSESSMENT:  DATE:   1. Patient will demonstrate independence with progressive HEP for [facility] and energy conservation strategies to address functional deficits with continued progress after discharge.     Initiated hand coordination HEP during intitial eval, would benefit from reinforcement []  PROGRESS MADE/        CONTINUE GOAL  []  GOAL MET  []  DC GOAL   Long Term Goals to be met within 8 sessions:  INITIAL STATUS REASSESSMENT:   Pt will be educated on AE for dressing to maximize IND and improve QOL.   Initiated education on elastic shoe laces and button hook at initial eval [x]  PROGRESS MADE/        CONTINUE GOAL  []  GOAL MET  []  DC GOAL     2. Pt will tolerate >20 mins of functional mobility while completing simple IADL task to demonstrate improve endurance  Reports fatigue, per patient report is transitioning out of RW []  PROGRESS MADE/        CONTINUE GOAL  []  GOAL MET  []  DC GOAL     3. Patient will decrease  time on 9 hole peg test by at least 5 seconds with the B UE in order to increase independence with clothing management.     Right hand: 47 seconds    Left hand: 43 seconds []  PROGRESS MADE/        CONTINUE GOAL  []  GOAL MET  []  DC GOAL     4. Pt will be educated on accessibility features to improve usage. Limited by neuropathy, states texting is very frustrating, decreased accuracy   []  PROGRESS MADE/        CONTINUE GOAL  []  GOAL MET  []  DC GOAL     5. Patient will complete cognitive and visual perceptual assessments in order to further assess safety with community mobility.     Is moving, would like to complete screening processes for returned to driving prior to upcoming move at end of november []  PROGRESS MADE/        CONTINUE GOAL  []  GOAL MET  []  DC GOAL       Pain:0/10 Location:                            Precautions:none  Subjective:   Date:   12/13/21: States he has back surgery scheduled @ 11/30.    12/10/21: Pt is doing well. States potential plan for surgery on 11/30  12/06/21: States he wants to extend OT visits through end of the year until he moves to [place]. Verbalized concerns over balance, activity stamina, cognition.  12/03/21: ADDENDUM: Pt states he is delaying move to [place] to January. Pt stating he would like to work on dynamic standing balance this date. Reports wife purchased [facility] activities/games that he has been using at home  11/29/2021 : States he is moving at end of November to [place]. Is frustrated with physical and cognitive decline since surgeries.     Treatment Session:     CPT code SKILLED INTERVENTIONS AND EDUCATION: Timed Code Treatment Minutes:    therapeutic activity        therapeutic exercise In stance at table top with blue foam balance mat engaged pt in the following exercises to address dynamic stance and trunk stability required for IADL management:    -chest press and horizontal ab/adduction ;   2x10 in static stance  2x10 with staggered stance with L/R elevated on blue foam mat+anterior/posterior weight shift  2x10 lateral stance with L/RLE elevated on blue foam mat+lateral weight shift  - Significant issues with his balance. Impulsive movements, fall risk.     UBE: 6 min @ 30 watts resistance       45 mins    self-care/home management Self care:     Indep donn /doff shoes with crossing legs or use of long handled shoe horn. Education for back protection principles reviewed 10         Timed Treatment TIme:  55    Total Treatment TIme: 27     Patient Education:   Date:   12/13/21: balance training  12/10/21: balance training, IADL retraining, phone accessibility  12/06/21: stamina, balance and cognitive retraining  12/03/21: dynamic standing required for household task and return to leisure activity  11/29/2021' return to drivingprocess  Education to: [x]  Patient []  Significant other   []  Caregiver []  other   Method: [x]  Explanation []  Demonstration   []  Handouts []  other    Response: [x]  Verbalized understanding []  Demonstrated understanding [x]  Needs reinforcement []  other  Plan:   Simulated IADL to address dynamic standing balance    This note serves as a discharge summary if the patient does not return to OCCUPATIONAL THERAPY per the above POC.       Therapist Signature: Tressia Miners, OTR/L  Date: 12/13/2021

## 2021-12-13 NOTE — Unmapped (Signed)
PT DAILY TREATMENT NOTE    Diagnosis:   1. Ankylosing hyperostosis (forestier), cervical region  AMB Follow-up to Physical Therapy      2. Encounter for other orthopedic aftercare  AMB Follow-up to Physical Therapy      3. Impaired functional mobility, balance, gait, and endurance  AMB Follow-up to Physical Therapy      4. Muscle weakness  AMB Follow-up to Physical Therapy              Referring Provider:Zeldin, Floyce Stakes, MD    Insurance plan:   Payer/Plan Subscr DOB Sex Relation Sub. Ins. ID Effective Group Num   1. MEDICARE - MEKAIYU, MIRABAL* 25-Dec-1945 Male Self [id number] 10/02/11                                    [address]   2. Kristin Bruins [place]* Nov 10, 1945 Male Self [id number] 10/01/13                                    [address]                         # of visits per insurance authorization:   PT,OT, ST -PLAN: MEDICARE PRIMARY  DRAKE IS IN NETWORK     PT OT SP-  PATIENT HAS AARP SUPPLEMENT INS SECONDARY  ONLY PAYS IF MEDICARE PAYS    # of visits per POC: Eval + 12 visits    Date of Initial Eval: 11/25/2021    Date of Last Progress Report: NA    Therapy Goals:   Short Term Goals: to be met by 2 weeks  Initial status (11/25/2021)   Pt will be compliant and independent with HEP in order to maintain progress made in physical therapy.  To be initiated     Pt will ambulate household distances without AD and no more than supervision in order to access home environment more independently.  Mod-I with RW; CGA-minA without AD         LongTerm Goals: to be met by time of discharge Initial status (11/25/2021)   Pt will improve LE MMT to 5/5 in order to improve strength needed for functional tasks, body mechanics, and overall symptoms.  Hip flexion: Left: 4+/5 Right: 4+/5  Hip abduction: Left: 4/5 Right: 4+/5  Hip extension: Left: 3+/5 Right: 3+/5   Pt will perform Timed Up and Go in <12 secs, without AD and with independence, in order to demonstrate decreased fall risk and appropriate safety and  independence with functional mobility.  10.81 with mod-I and RW  17.1 without AD, CGA   Pt will improve distance on 6 Minute Walk Test by at least 164 ft, ambulating without AD and no more than SPV, in order to achieve MCID/MDC, improve endurance, and improve community mobility.  1032' without AD, minA   Pt will participate in one standardized balance/gait measure in order to assess fall risk and community mobility. Goal to be made accordingly.  To be initiated    12/06/2021: GOAL MET   NEW GOAL 12/06/2021: Pt will improve score on Berg Balance Scale by >/= 5 points in order to achieve MCID, improve balance, and decrease fall risk.  Initial 12/06/2021: 40/56   NEW GOAL 12/06/2021: Pt will score >22/30 on Functional Gait Assessment  in order to decrease fall risk and improve dynamic balance and ambulation.   Initial: 14/30     ______________________________________________________________________  Pain:0/10 Location: Neuropathic pain                        Precautions: Cervical surgery, fall risk, orthostasis     Activity Date: 11/25/2021 Date: 11/29/2021 Date: 12/03/2021 Date: 12/06/2021 12/10/2021 12/13/2021    Visit # Eval, 1 2 3 4 5 6    Checked for POC Certification  Yes         Verified Signed POC:    YES[x]       NO[]            Date:  Attestation signed by Hurshel Keys, MD at 11/25/2021  4:01 PM  Rerouted to Referral Source for signature via: EMR[]   Fax[]    Mail[]       Date:  Routed to PCP (if other than referring) for signature via: EMR[]   Fax[]     Mail[]      Date:  Was POC updated?        YES[]         NO[]                            Date:        If yes, Verified Signed POC:      YES[]             NO[]            Date:      CPT code  Daily treatment record Timed Code Treatment Minutes     Neuromuscular Re-education: [phone] Clock Yourself performed to address dynamic balance, stepping reactions, and cognitive dual-tasking. Performed simple colors and 2 brain games x 3 min bouts ea with CGA, one instance of minA for LOB  d/t scissoring and anterior pitch.  13     Therapeutic Exercise: 97110   Sidestepping and fwd/bwd monster walks vs YTB performed in // bars with SPV-CGA. Decreased to unilateral UE support to simulate countertop. Added to HEP. Cues for eccentric control and preventing truncal compensations. Handout provided.     Reiterated HEP and safe use of gym equipment.  25     Gait Training: 60454   Gait training performed without AD with SBA-CGA x 16 mins without rest break. Better speed and less shuffling noted, though limited arm swing, rigid trunk.    Educated on importance of continuing to utilize walker as pt continues to be a fall risk. Able to practice with hurrycane at home.   20   Response to Treatment Pt's gait mechanics without AD continue to improve but likely remains a fall risk when reactive balance or complex environments are challenging. Will reassess objectively in next session. Appropriately fatigued by banded exercises.      Total timed coded treatment minutes:  58    Total treatment time (timed + untimed):  58     Plan for Next Visit: Gait training without device, multi-directional stepping and gait. Functional strengthening and balance HEP. Stretches as indicated. Retest Berg and FGA next session for fall risk and AD.     Additional Information:   12/13/2021: Pt reports speaking to another neurosurgeon, who also supports an upcoming surgery d/t continued compression.   12/10/2021: Pt saw neurosurgeon this week and they are planning for a third surgery on November 30 d/t continued compression. First surgery was 9/19 (was doing well for a week),  second was to correct CSF leak, etc on 9/30.    12/06/2021: Pt reported he was able to use the stationary bike over the weekend and walked laps.     Patient Education:   12/13/2021: Sidestepping and monster walk vs YTB at countertop added to HEP.   12/10/2021: Extensive time spent this session educating pt and pt's wife on the following: neuroanatomy; spinal cord and  spinal tracts; motor coordination, timing, and fall risk; surgery and rehab indications; PT POC and goals.   12/06/2021: Educated on [place] results and safe use of hurrycane. Added pec stretch over towel roll and Thomas test to HEP.   12/03/2021: LTR and book openers added to HEP. Educated on continuing to use AD.   11/29/2021: Discussed fall risk indicators with FGA score and importance of continuing to use AD. Discussed safe use of recumbent bike at home gym and how to slowly increase tolerance and time. Discussed performing as neural priming prior to walking practice at home. HEP: SL bridge, narrow stance with head turns/nods, staggered stance, fwd/bwd/side step in place.   11/25/2021: Pt educated on PT initial evaluation findings, PT plan of care, and follow-up treatment. Pt verbalized understanding. Educated on continuing to build walking endurance and aerobic conditioning with laps with RW. HEP including SL bridge for hip strengthening; handout provided. Discussed future safe gym interventions.     This note serves as a discharge summary if the patient does not return to Physical Therapy per the above POC.

## 2021-12-13 NOTE — Unmapped (Unsigned)
OUTPATIENT SPEECH LANGUAGE PATHOLOGY  DAILY NOTE    Name: Stranger DOB:04-12-1945 TKZ:[id number]  Today's Date: 12/13/2021  Referring Provider: Lorne Skeens  [address] Neurology   [facility] [address]     Primary Diagnosis:76 y/o male s/p C3-4 ACDF on 9/19 complicated by intraoperative CSF leak presents with progressive R neck swelling concerning for continued CSF leak. 9/30: underwent CSF leak repair and neck exploration.   Onset Date: 10/19/2021 first surgery with revision on  10/29/2021  Diagnosis:dysphagia, memory complaints  Insurance Plan:Payor: MEDICARE / Plan: MEDICARE A AND B / Product Type: Medicare /   # visits per insurance authorization:   11/23/2021/[facility] [id number] AUTH INFO   PT,OT, ST -PLAN: MEDICARE PRIMARY  DRAKE IS IN NETWORK  PT & ST GET $2230 COMBINED PCY USED- $1266.95 POSSIBLE 12 VISITS REMAINING  OT GETS $2230 PCY USED - $75.57 POSSIBLE 26 VISITS REMAINING  CO-INS : 80%  Date of initial eval: 11/25/2021  # visits per POC: 10  Date of initial POC Certification: 11/25/2021  Verified Signed POC Certification:  Yes: x  on 11/26/2021     Date of POC Recertification: n/a  Verified Signed POC Recertification: 12/03/2021   Date of Progress Report (every 10 visits):     POC Recertification due(Medicaid 60 days/Medicare 90 days): 02/25/2022      Goals:  Goal 11/29/2021 12/03/2021 12/06/2021 11/10 12/13/2021   Complete MBS and add goals as needed.  Scheduled for 11/30/2021 at 10:30 Completed. MET Repeat scheduled for 11/28 Scheduled for 11/28    Pt will complete swallow exercises focusing on pharyngeal /laryngeal strengthening (hyolarygneal elevation/ excursion, BOT, and pharyngeal stripping) with 100% accuracy as HEP.  New goal Effortful swallow x10, Masako x10, Mendelsohn x10, and CTAR (spring device with red) x10 Effortful using CTAR device x 10 x 2 trials  IOPI: Held target of 75% of max for 2-3 sec x 10 trials Effortful using CTAR device x 10     IOPI not targeted this  session    Masako 5 times to 100%    Mendelsohn x5 to 100%    [employer] w/ effortful swallow then a drink, then cough x2 to 100%   Pt will complete MBS in 2-3 wks  To be scheduled To be scheduled 11/28 11/28   Pt will demonstrate swallow strategies independently  Demonstrated small sips and throat clear Discussed Met Met   Patient/caregivers will demonstrate understanding of role of speech therapy diagnosis, prognosis, home exercise programs, plan of care, recommendations, and safety precautions. ongoing ongoing ongoing ongoing ongoing                                     ______________________________________________________________________  Visit #: 3 Charges: 92526  Total Minutes: 50    Pt arrived unaccompanied.  Did not report difficulty or pain with eating/drinking. Pt has upcoming spinal surgery on 11/30. IOPI was not targeted this session due to Pt not bringing the bulb.  Plan to text max next session. Pt reports feeling stronger with the HEP.      Repeat all exercises 10 times/ 3 times/day.  - Pt reports completed HEP every day as of 12/13/2021    Effortful swallow: Swallow as hard as you can like you are swallowing a golf ball while pushing down on a ball (CTAR).       Push the tongue bulb on the roof of your mouth (front) and hold there for  a count of 5    Push the tongue bulb on the roof of your mouth (middle/back) and hold there for a count of 5      Education Addressed During This Session: Importance of swallow exercises, plan for continued therapy.     Plan:  2x/week x 4 weeks or until upcoming surgery       This note serves as a discharge summary if the patient does not return to Speech Therapy per the above POC.    Christy Sartorius  Graduate Student Clinician

## 2021-12-14 ENCOUNTER — Encounter: Attending: Student in an Organized Health Care Education/Training Program

## 2021-12-14 ENCOUNTER — Institutional Professional Consult (permissible substitution): Admit: 2021-12-14 | Discharge: 2021-12-21 | Payer: MEDICARE

## 2021-12-14 DIAGNOSIS — M4712 Other spondylosis with myelopathy, cervical region: Secondary | ICD-10-CM

## 2021-12-14 LAB — COMPREHENSIVE METABOLIC PANEL
ALT: 27 U/L (ref 7–52)
AST: 22 U/L (ref 13–39)
Albumin: 3.8 g/dL (ref 3.5–5.7)
Alkaline Phosphatase: 61 U/L (ref 36–125)
Anion Gap: 7 mmol/L (ref 3–16)
BUN: 25 mg/dL (ref 7–25)
CO2: 32 mmol/L (ref 21–33)
Calcium: 9.2 mg/dL (ref 8.6–10.3)
Chloride: 102 mmol/L (ref 98–110)
Creatinine: 0.86 mg/dL (ref 0.60–1.30)
EGFR: >90
Glucose: 107 mg/dL (ref 70–100)
Osmolality, Calculated: 297 mOsm/kg (ref 278–305)
Potassium: 4.3 mmol/L (ref 3.5–5.3)
Sodium: 141 mmol/L (ref 133–146)
Total Bilirubin: 0.4 mg/dL (ref 0.0–1.5)
Total Protein: 6.8 g/dL (ref 6.4–8.9)

## 2021-12-14 LAB — DIFFERENTIAL
Basophils Absolute: 46 /uL (ref 0–200)
Basophils Relative: 0.7 % (ref 0.0–1.0)
Eosinophils Absolute: 312 /uL (ref 15–500)
Eosinophils Relative: 4.8 % (ref 0.0–8.0)
Lymphocytes Absolute: 1508 /uL (ref 850–3900)
Lymphocytes Relative: 23.2 % (ref 15.0–45.0)
Monocytes Absolute: 618 /uL (ref 200–950)
Monocytes Relative: 9.5 % (ref 0.0–12.0)
Neutrophils Absolute: 4017 /uL (ref 1500–7800)
Neutrophils Relative: 61.8 % (ref 40.0–80.0)
nRBC: 0 /100 WBC (ref 0–0)

## 2021-12-14 LAB — POC URINALYSIS
Bilirubin, UA: NEGATIVE
Blood, POC, UA: NEGATIVE
Glucose, POC, UA: NEGATIVE mg/dL
Ketones, POC, UA: NEGATIVE mg/dL
Leukocytes, POC, UA: NEGATIVE
Nitrite, POC, UA: NEGATIVE
Protein, POC, UA: NEGATIVE mg/dL
Spec Grav, UA: 1.02 (ref 1.005–1.035)
Urobilinogen, POC, UA: 0.2 mg/dL (ref 0.2–1.0)
pH, POC,UA: 6.5 (ref 5.0–8.0)

## 2021-12-14 LAB — PROTIME-INR
INR: 1 (ref 0.9–1.1)
Protime: 13.7 seconds (ref 12.1–15.1)

## 2021-12-14 LAB — ABO/RH: Rh Type: POSITIVE

## 2021-12-14 LAB — CBC
Hematocrit: 38.2 % (ref 38.5–50.0)
Hemoglobin: 13.1 g/dL (ref 13.2–17.1)
MCH: 32.9 pg (ref 27.0–33.0)
MCHC: 34.2 g/dL (ref 32.0–36.0)
MCV: 96.3 fL (ref 80.0–100.0)
MPV: 9 fL (ref 7.5–11.5)
Platelets: 177 10*3/uL (ref 140–400)
RBC: 3.96 10*6/uL (ref 4.20–5.80)
RDW: 14.4 % (ref 11.0–15.0)
WBC: 6.5 10*3/uL (ref 3.8–10.8)

## 2021-12-14 LAB — HEMOGLOBIN A1C: Hemoglobin A1C: 5.3 % (ref 4.0–5.6)

## 2021-12-14 LAB — MRSA/STAPH AUREUS DNA - PRE-SURGICAL SCREEN ONLY
MRSA, PCR: NEGATIVE
Staph Aureus, PCR: NEGATIVE

## 2021-12-14 LAB — ANTIBODY SCREEN: Antibody Screen: NEGATIVE

## 2021-12-14 LAB — APTT: aPTT: 34.6 seconds (ref 25.5–35.0)

## 2021-12-14 NOTE — Unmapped (Addendum)
Patient here for pre-op lab testing and CHG instruction prior to C3-C5 posterior cervical laminectomy and fusion.  The patient has received all additional pre-op instructions from [place] on 12/10/21.  Patient endorses having received these instructions , denies additional questions, and denies any changes to health status or medications since this conversation.  CHG kit given to patient.  Reviewed and received instructions in writing on the CHG shower instruction card and SSI checklist.  He is aware to bring the checklist back on the day of surgery, and is aware that we will call if Mupirocin nasal ointment is indicated as discussed at the visit.  Patient verbalized understanding of all instructions.  Labs collected today include:  T&S, CBC with Diff, CMP, PT/INR, PTT, A1c, Staph nasal swab, UA, and ECG.      Addendum 12/15/21:  All pre-op labs collected listed above have resulted and are available for review by Dr. Sena Hitchheng. Epic message sent to Dr. Sena Hitchheng.  Results unremarkable.

## 2021-12-17 ENCOUNTER — Encounter: Admit: 2021-12-17 | Discharge: 2021-12-17 | Payer: MEDICARE

## 2021-12-17 ENCOUNTER — Ambulatory Visit: Payer: MEDICARE | Attending: Neurology

## 2021-12-17 DIAGNOSIS — M4812 Ankylosing hyperostosis [Forestier], cervical region: Secondary | ICD-10-CM

## 2021-12-17 DIAGNOSIS — R1312 Dysphagia, oropharyngeal phase: Secondary | ICD-10-CM

## 2021-12-17 DIAGNOSIS — Z789 Other specified health status: Secondary | ICD-10-CM

## 2021-12-17 NOTE — Unmapped (Signed)
PT DAILY TREATMENT NOTE    Diagnosis:   1. Ankylosing hyperostosis (forestier), cervical region  AMB Follow-up to Physical Therapy      2. Encounter for other orthopedic aftercare  AMB Follow-up to Physical Therapy      3. Impaired functional mobility, balance, gait, and endurance  AMB Follow-up to Physical Therapy      4. Muscle weakness  AMB Follow-up to Physical Therapy              Referring Provider:Zeldin, Floyce Stakes, MD    Insurance plan:   Payer/Plan Subscr DOB Sex Relation Sub. Ins. ID Effective Group Num   1. MEDICARE - MEGRECO, GASTELUM* Aug 06, 1945 Male Self [id number] 10/02/11                                    [address]   2. Kristin Bruins [place]* 04-Jan-1946 Male Self [id number] 10/01/13                                    [address]                         # of visits per insurance authorization:   PT,OT, ST -PLAN: MEDICARE PRIMARY  DRAKE IS IN NETWORK     PT OT SP-  PATIENT HAS AARP SUPPLEMENT INS SECONDARY  ONLY PAYS IF MEDICARE PAYS    # of visits per POC: Eval + 12 visits    Date of Initial Eval: 11/25/2021    Date of Last Progress Report: NA    Therapy Goals:   Short Term Goals: to be met by 2 weeks  Initial status (11/25/2021)   Pt will be compliant and independent with HEP in order to maintain progress made in physical therapy.  To be initiated     Pt will ambulate household distances without AD and no more than supervision in order to access home environment more independently.  Mod-I with RW; CGA-minA without AD         LongTerm Goals: to be met by time of discharge Initial status (11/25/2021) 12/17/2021   Pt will improve LE MMT to 5/5 in order to improve strength needed for functional tasks, body mechanics, and overall symptoms.  Hip flexion: Left: 4+/5 Right: 4+/5  Hip abduction: Left: 4/5 Right: 4+/5  Hip extension: Left: 3+/5 Right: 3+/5    Pt will perform Timed Up and Go in <12 secs, without AD and with independence, in order to demonstrate decreased fall risk and appropriate  safety and independence with functional mobility.  10.81 with mod-I and RW  17.1 without AD, CGA    Pt will improve distance on 6 Minute Walk Test by at least 164 ft, ambulating without AD and no more than SPV, in order to achieve MCID/MDC, improve endurance, and improve community mobility.  1032' without AD, minA    Pt will participate in one standardized balance/gait measure in order to assess fall risk and community mobility. Goal to be made accordingly.  To be initiated    12/06/2021: GOAL MET    NEW GOAL 12/06/2021: Pt will improve score on Berg Balance Scale by >/= 5 points in order to achieve MCID, improve balance, and decrease fall risk.  Initial 12/06/2021: 40/56 GOAL MET - 46/56   NEW GOAL  12/06/2021: Pt will score >22/30 on Functional Gait Assessment in order to decrease fall risk and improve dynamic balance and ambulation.   Initial: 14/30 Progressing - 20/30     ______________________________________________________________________  Pain:0/10 Location: Neuropathic pain                        Precautions: Cervical surgery, fall risk, orthostasis     Activity Date: 11/25/2021 Date: 11/29/2021 Date: 12/03/2021 Date: 12/06/2021 12/10/2021 12/13/2021 12/17/2021    Visit # Eval, 1 2 3 4 5 6 7    Checked for POC Certification  Yes          Verified Signed POC:    YES[x]       NO[]            Date:  Attestation signed by Hurshel Keys, MD at 11/25/2021  4:01 PM  Rerouted to Referral Source for signature via: EMR[]   Fax[]    Mail[]       Date:  Routed to PCP (if other than referring) for signature via: EMR[]   Fax[]     Mail[]      Date:  Was POC updated?        YES[]         NO[]                            Date:        If yes, Verified Signed POC:      YES[]             NO[]            Date:      CPT code  Daily treatment record Timed Code Treatment Minutes     Neuromuscular Re-education: 97112 BERG BALANCE SCALE       12/06/2021     2:27 PM 12/17/2021     2:48 PM   Berg Balance Test   Sitting to standing 4 4   Standing  unsupported 4 4   Sitting with back unsupported but feet supported on floor or on a stool 4 4   Standing to sitting 4 4   Transfers 4 4   Standing unsupported with eyes closed 3 3   Standing unsupported with feet together 3 3   Reaching forward with outstretched arm while standing 4 4   Pick up object from the floor from a standing position 3 4   Turning to look behind over left and right shoulders while standing 1 2   Turn 360 degrees 1 3   Placing alternate foot on a step on stool while standing 1 2   Standing unsupported one foot in front 3 3   Standing on one foot/leg 1 2   Total Score 40 46     Discussed fall risk indications. Reviewed sensory integration HEP, focusing on head turns/nods, EC, trunk rotations, and other positions to challenge somatosensory and visual systems.  19     Therapeutic Exercise: 97110   HIIT performed on recumbent bike x 10 mins for neural priming, aerobic conditioning, reciprocal patterning, and muscular strengthening. Warmup performed @ Level 4 followed by 1 min, higher speed/resistance intervals @ Level 7 >60 SPM, followed by 1 min lighter intervals for recovery x 4 rounds.  10     Gait Training: 16109       12/17/2021   Funtional Gait Assessment   Gait Level Surface Mild Impairment   Gait with Horizontal Head Turns Mild Impairment  Gait and Pivot Turn Normal   Gait with Narrow Base of Support Severe Impairment   Ambulating Backwards Mild Impairment   Change in Gait Speed Normal   Gait with Vertical Head Turns Normal   Step Over Obstacle Mild Impairment   Gait with Closed Eyes Moderate Impairment   Steps Mild Impairment   Total Score out of 30 20      Discussed fall risk indications. Educated on ambulating without device in open apartment hallway and inside apartment. Walker to be used in the community and when fatigued, in pain, or more unstable.     Resisted walking performed at cable column with 10# fwd, bwd, and sidestepping with CGA to work on gait amplitude and stability with  external perturbations. Performed x5 in ea direction. Added bwd walking to HEP.  28   Response to Treatment Pt's score on Berg of >45/56 does not increase fall risk; however, score on FGA of <23/30 increases fall risk. OK to ambulate in the home environment without AD only. Reviewed HEP to prepare for upcoming discharge and surgery. Overall doing well.      Total timed coded treatment minutes:  57    Total treatment time (timed + untimed):  57     Plan for Next Visit: Progress note and discharge next session for upcoming surgery.     Additional Information:   12/17/2021: Pt reports he feels like his strength and walking is getting better.   12/13/2021: Pt reports speaking to another neurosurgeon, who also supports an upcoming surgery d/t continued compression.   12/10/2021: Pt saw neurosurgeon this week and they are planning for a third surgery on November 30 d/t continued compression. First surgery was 9/19 (was doing well for a week), second was to correct CSF leak, etc on 9/30.    12/06/2021: Pt reported he was able to use the stationary bike over the weekend and walked laps.     Patient Education:   12/17/2021: Discussed fall risk indications. Educated on ambulating without device in open apartment hallway and inside apartment. Walker to be used in the community and when fatigued, in pain, or more unstable. Added bwd walking to HEP. Reviewed sensory integration HEP, focusing on head turns/nods, EC, trunk rotations, and other positions to challenge somatosensory and visual systems.   12/13/2021: Sidestepping and monster walk vs YTB at countertop added to HEP.   12/10/2021: Extensive time spent this session educating pt and pt's wife on the following: neuroanatomy; spinal cord and spinal tracts; motor coordination, timing, and fall risk; surgery and rehab indications; PT POC and goals.   12/06/2021: Educated on [place] results and safe use of hurrycane. Added pec stretch over towel roll and Thomas test to HEP.    12/03/2021: LTR and book openers added to HEP. Educated on continuing to use AD.   11/29/2021: Discussed fall risk indicators with FGA score and importance of continuing to use AD. Discussed safe use of recumbent bike at home gym and how to slowly increase tolerance and time. Discussed performing as neural priming prior to walking practice at home. HEP: SL bridge, narrow stance with head turns/nods, staggered stance, fwd/bwd/side step in place.   11/25/2021: Pt educated on PT initial evaluation findings, PT plan of care, and follow-up treatment. Pt verbalized understanding. Educated on continuing to build walking endurance and aerobic conditioning with laps with RW. HEP including SL bridge for hip strengthening; handout provided. Discussed future safe gym interventions.     This note serves as a discharge  summary if the patient does not return to Physical Therapy per the above POC.

## 2021-12-17 NOTE — Unmapped (Signed)
OUTPATIENT SPEECH LANGUAGE PATHOLOGY  DAILY NOTE    Name:Matthew [name] DOB:September 27, 1945 ZOX:[id number]  Today's Date: 12/17/2021  Referring Provider: Lorne Skeens  [address] Neurology   [facility] [address]     Primary Diagnosis:76 y/o male s/p C3-4 ACDF on 9/19 complicated by intraoperative CSF leak presents with progressive R neck swelling concerning for continued CSF leak. 9/30: underwent CSF leak repair and neck exploration.   Onset Date: 10/19/2021 first surgery with revision on  10/29/2021  Diagnosis:dysphagia, memory complaints  Insurance Plan:Payor: MEDICARE / Plan: MEDICARE A AND B / Product Type: Medicare /   # visits per insurance authorization:   11/23/2021/TDC [id number] AUTH INFO   PT,OT, ST -PLAN: MEDICARE PRIMARY  DRAKE IS IN NETWORK  PT & ST GET $2230 COMBINED PCY USED- $1266.95 POSSIBLE 12 VISITS REMAINING  OT GETS $2230 PCY USED - $75.57 POSSIBLE 26 VISITS REMAINING  CO-INS : 80%  Date of initial eval: 11/25/2021  # visits per POC: 10  Date of initial POC Certification: 11/25/2021  Verified Signed POC Certification:  Yes: x  on 11/26/2021     Date of POC Recertification: n/a  Verified Signed POC Recertification: 12/03/2021   Date of Progress Report (every 10 visits):     POC Recertification due(Medicaid 60 days/Medicare 90 days): 02/25/2022      Goals:  Goal 11/29/2021 12/03/2021 12/06/2021 11/10 12/13/2021 12/17/2021   Complete MBS and add goals as needed.  Scheduled for 11/30/2021 at 10:30 Completed. MET Repeat scheduled for 11/28 Scheduled for 11/28 Scheduled for 11/28    Pt will complete swallow exercises focusing on pharyngeal /laryngeal strengthening (hyolarygneal elevation/ excursion, BOT, and pharyngeal stripping) with 100% accuracy as HEP.  New goal Effortful swallow x10, Masako x10, Mendelsohn x10, and CTAR (spring device with red) x10 Effortful using CTAR device x 10 x 2 trials  IOPI: Held target of 75% of max for 2-3 sec x 10 trials Effortful using CTAR device x 10      IOPI not targeted this session    Masako 5 times, modified due to difficulties completing    Mendelsohn x5 to 100%    [employer] w/ effortful swallow, liquid wash then cough x2 to 100% IOPI x 10 with tongue tip (peak of 41kPa today and tongue blade, 45 kPa.    PEP with breather today set at 5   Pt will complete MBS in 2-3 wks  To be scheduled To be scheduled 11/28 11/28 11/28    Pt will demonstrate swallow strategies independently  Demonstrated small sips and throat clear Discussed Met Met met   Patient/caregivers will demonstrate understanding of role of speech therapy diagnosis, prognosis, home exercise programs, plan of care, recommendations, and safety precautions. ongoing ongoing ongoing ongoing ongoing ongoing                                         ______________________________________________________________________  Visit #: 5 Charges: 92526  Total Minutes: 50    Pt arrived unaccompanied.  No new complaints.  Tolerated thin by cup and cookie today.    Repeat all exercises 10 times/ 3 times/day.  - Pt continue to complete HEP  added expiratory muscle trainer today    Effortful swallow: Swallow as hard as you can like you are swallowing a golf ball while pushing down on a ball (CTAR).       Push the tongue bulb on the roof of  your mouth (front) and hold there for a count of 5    Push the tongue bulb on the roof of your mouth (middle/back) and hold there for a count of 5    Complete PEP at 5x x 5 sets for total of 25.      Education Addressed During This Session: Importance of swallow exercises, plan for continued therapy, cautioning against mixed consistencies, s/s of aspiration    Plan:  continue with dysphagia therapy, diet of D3/thin with strategies.       This note serves as a discharge summary if the patient does not return to Speech Therapy per the above POC.         Jola Baptist MA CCC-SLP [phone]  Phone number [phone]  Email:  [email or web address]

## 2021-12-17 NOTE — Unmapped (Signed)
Patient has appointment with PCP 12/21/21 for H&P.

## 2021-12-17 NOTE — Unmapped (Signed)
Occupational Therapy Treatment Encounter Note  Name: Matthew [name]  Date of Birth: 1945/10/16  MRN: [id number]    Referring Physician: Hurshel Keys, MD  [address]  Neurology  [place],  [place] [address] Phone: [phone]  Fax: [phone]    Treatment Diagnosis: Impaired IADL's    Current Encounter History: History and Reason for Referral:: Zaron Zwiefelhofer is a 76 y.o. 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 Pt with PMHx including cervical dystonia, cerebellar ataxia, and recent C3-4 ACDF coomplicated by CSF leak with lumbar drain placement on 10/29/21. Pt discharged from hospital to IPR and has been home for ~2 weeks per pt/spouse report. At baseline pt stating IND with all I/ADLs and mobility without AD. He enjoyed regular exercises and playing tennis. Currently pt is using RW for functional mobility and is completing ADLs at a seated vs standing level due to LE fatigue. 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 Pt would benefit from OP OT at this time to address the above mentioned deficits to increase function and improve upon quality of life.     DATE OF INITIAL OT EVALUATION :10/26  VERIFIED SIGNED POC:  [x]  yes; DATE:      Cosigned by: Hurshel Keys, MD at 11/26/2021  9:16 AM       Attestation signed by Hurshel Keys, MD at 11/26/2021  9:16 AM     Electronically Signed By:  Lorne Skeens, MD  Physical Medicine and Rehabilitation  11/26/2021 9:16 AM                 Activity Date:  10/30  Date:   11/03 Date:  11/06  Date:  11/10  Date:  11/13  Date:  Date:  Date:  Date:     Visit # 2 3 4 5 6 7 8 9 10    Checked for POC Certification                     Insurance: Payor: MEDICARE / Plan: MEDICARE A AND B / Product Type:  Medicare /   Therapy Goals:     Short Term Goals to be met within 4 sessions INITIAL STATUS REASSESSMENT:  DATE:   1. Patient will demonstrate independence with progressive HEP for [facility] and energy conservation strategies to address functional deficits with continued progress after discharge.     Initiated hand coordination HEP during intitial eval, would benefit from reinforcement []  PROGRESS MADE/        CONTINUE GOAL  []  GOAL MET  []  DC GOAL   Long Term Goals to be met within 8 sessions:  INITIAL STATUS REASSESSMENT:   Pt will be educated on AE for dressing to maximize IND and improve QOL.   Initiated education on elastic shoe laces and button hook at initial eval [x]  PROGRESS MADE/        CONTINUE GOAL  []  GOAL MET  []  DC GOAL     2. Pt will tolerate >20 mins of functional mobility while completing simple IADL task to demonstrate improve endurance  Reports fatigue, per patient report is transitioning out of RW []  PROGRESS MADE/        CONTINUE GOAL  []  GOAL MET  []  DC GOAL     3. Patient will decrease  time on 9 hole peg test by at least 5 seconds with the B UE in order to increase independence with clothing management.     Right hand: 47 seconds    Left hand: 43 seconds []  PROGRESS MADE/        CONTINUE GOAL  []  GOAL MET  []  DC GOAL     4. Pt will be educated on accessibility features to improve usage. Limited by neuropathy, states texting is very frustrating, decreased accuracy   []  PROGRESS MADE/        CONTINUE GOAL  []  GOAL MET  []  DC GOAL     5. Patient will complete cognitive and visual perceptual assessments in order to further assess safety with community mobility.     Is moving, would like to complete screening processes for returned to driving prior to upcoming move at end of november []  PROGRESS MADE/        CONTINUE GOAL  []  GOAL MET  []  DC GOAL       Pain:0/10 Location:                            Precautions:none  Subjective:   Date:   12/17/21: Reports completing HEPs from all 3 therapies has been  challenging. States he is most motivated to complete swallowing HEPs so he can pass is swallow study on 11/28.   12/13/21: States he has back surgery scheduled @ 11/30.   12/10/21: Pt is doing well. States potential plan for surgery on 11/30  12/06/21: States he wants to extend OT visits through end of the year until he moves to [place]. Verbalized concerns over balance, activity stamina, cognition.  12/03/21: ADDENDUM: Pt states he is delaying move to [place] to January. Pt stating he would like to work on dynamic standing balance this date. Reports wife purchased [facility] activities/games that he has been using at home  11/29/2021 : States he is moving at end of November to [place]. Is frustrated with physical and cognitive decline since surgeries.     Treatment Session:     CPT code SKILLED INTERVENTIONS AND EDUCATION: Timed Code Treatment Minutes:    therapeutic activity Bal-A-Vis-X  Bal-A-Vis-X is a series of some 300 exercises, most of which are done with sand-filled bags and/or racquetballs, often while standing on a Bal-A-Vis-X balance board. Requiring multiple thousands of mid-line crossings in three dimensions, these exercises are steadily rhythmic, with a pronounced auditory foundation, executed at a pace that naturally results from proper physical techniques. Bal-A-Vis-X enables the whole mind-body system to experience the symmetrical flow of a pendulum. Exercises address visual tracking deficiencies and auditory imprecision, impulsivity, balance and anxiety issues. By virtue of teachable techniques (not athleticism), Bal-A-Vis-X enables body systems to experience the flow of a pendulum, thereby affording brain systems calm and sustained focus. Individual exercises promote self-challenge. Partner and group exercises demand cooperation and foster peer teaching.    Exercise Progression with Racket Ball: one hand drop catch, alternating hand drop catch, two hand drop catch, toss back forth with partner, toss back  forth with clap.    Activities completed during session:   -Single hand drop catch with addition of recall task with goal of naming 15 items:  1 animal per drop/catch: 11 without cues, addition of 4 with cues  1 vegetable per drop/catch: 9 without verbal cues, ~5-7 seconds for processing with additional 3, cues for additional 3x  Animal 2nd trial: 14 with 3-5  seconds for processing after 9, cues for additional 1    -Single hand (drop bounce bounce catch). Increased effort to complete     25   therapeutic exercise In stance at arm bike 8 mins with min resistance.      Addressed dynamic standing balance required for standing level ADLs using dowel rod with emphasis on reaching outside BOS:    -Pt tolerated static stance with slight trunk rotation to move cubes on table using dowel rod. OT graded activity by elevating R/L foot on blue foam mat. No overt LOB.     -progressed to alternating bewteen L/R foot on blue foam mat. Pt able to complete without use of table for UE support. Slight LOB with CGA to correct progressing to SBA.    -Pt tasked with retrieving items around table between activity without use of AD. No overt LOB noted, utilized energy conservation technique placing small items in container to transport items vs carrying one item at a time.  Note: At end of activity pt instructed to name 1 food item per block placed in target container, deficits with processing time and motor planning to complete tasks.    35 ins    self-care/home management           Timed Treatment TIme:  60    Total Treatment TIme: 109     Patient Education:   Date:  12/17/21: balance training, processing time+recall   12/13/21: balance training,   12/10/21: balance training, IADL retraining, phone accessibility  12/06/21: stamina, balance and cognitive retraining  12/03/21: dynamic standing required for household task and return to leisure activity  11/29/2021' return to drivingprocess  Education to: [x]  Patient []  Significant other   []   Caregiver []  other   Method: [x]  Explanation []  Demonstration   []  Handouts []  other   Response: [x]  Verbalized understanding []  Demonstrated understanding [x]  Needs reinforcement []  other     Plan:   Simulated IADL to address dynamic standing balance    This note serves as a discharge summary if the patient does not return to OCCUPATIONAL THERAPY per the above POC.       Therapist Signature: Antonieta Iba, OTR/L  Date: 12/17/2021

## 2021-12-22 ENCOUNTER — Encounter: Attending: Student in an Organized Health Care Education/Training Program

## 2021-12-27 ENCOUNTER — Encounter: Admit: 2021-12-27 | Discharge: 2021-12-27 | Payer: MEDICARE

## 2021-12-27 DIAGNOSIS — R1312 Dysphagia, oropharyngeal phase: Secondary | ICD-10-CM

## 2021-12-27 DIAGNOSIS — M4812 Ankylosing hyperostosis [Forestier], cervical region: Secondary | ICD-10-CM

## 2021-12-27 DIAGNOSIS — Z789 Other specified health status: Secondary | ICD-10-CM

## 2021-12-27 NOTE — Unmapped (Signed)
Occupational Therapy Treatment/ Reassessment/ Discharge Encounter Note  Name: Matthew [name]  Date of Birth: 12/07/1945  MRN: [id number]    Referring Physician: Hurshel Keys, MD  [address]  Neurology  [place],  [place] [address] Phone: [phone]  Fax: [phone]    Treatment Diagnosis: Impaired IADL's    Current Encounter History: History and Reason for Referral:: Matthew [name] is a 76 y.o. 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 Pt with PMHx including cervical dystonia, cerebellar ataxia, and recent C3-4 ACDF coomplicated by CSF leak with lumbar drain placement on 10/29/21. Pt discharged from hospital to IPR and has been home for ~2 weeks per pt/spouse report. At baseline pt stating IND with all I/ADLs and mobility without AD. He enjoyed regular exercises and playing tennis. Currently pt is using RW for functional mobility and is completing ADLs at a seated vs standing level due to LE fatigue. 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 Pt would benefit from OP OT at this time to address the above mentioned deficits to increase function and improve upon quality of life.     DATE OF INITIAL OT EVALUATION :10/26  VERIFIED SIGNED POC:  [x]  yes; DATE:      Cosigned by: Hurshel Keys, MD at 11/26/2021  9:16 AM       Attestation signed by Hurshel Keys, MD at 11/26/2021  9:16 AM     Electronically Signed By:  Lorne Skeens, MD  Physical Medicine and Rehabilitation  11/26/2021 9:16 AM                 Activity Date:  10/30  Date:   11/03 Date:  11/06  Date:  11/10  Date:  11/13  Date:  11/17  Date:   11/27 Date:  Date:     Visit # 2 3 4 5 6 7 8 9 10    Checked for POC Certification                     Insurance: Payor: MEDICARE /  Plan: MEDICARE A AND B / Product Type: Medicare /   Therapy Goals:     Short Term Goals to be met within 4 sessions INITIAL STATUS REASSESSMENT:  DATE: 12/27/21   1. Patient will demonstrate independence with progressive HEP for [facility] and energy conservation strategies to address functional deficits with continued progress after discharge.     Initiated hand coordination HEP during intitial eval, would benefit from reinforcement []  PROGRESS MADE/        CONTINUE GOAL  []  GOAL MET  [x]  DC GOAL;    12/27/21: SUPV with HEP for [facility] and energy conservation strategies to address functional deficits    Long Term Goals to be met within 8 sessions:  INITIAL STATUS REASSESSMENT:  12/27/21   Pt will be educated on AE for dressing to maximize IND and improve QOL.   Initiated education on elastic shoe laces and button hook at initial eval []  PROGRESS MADE/        CONTINUE GOAL  [x]  GOAL MET  [x]  DC GOAL    12/27/21 ; MOD INDEP with dressing skills; educated on AE for LE dressing for back protection principles.      2. Pt will tolerate >20 mins of functional mobility  while completing simple IADL task to demonstrate improve endurance  Reports fatigue, per patient report is transitioning out of RW []  PROGRESS MADE/        CONTINUE GOAL  []  GOAL MET  [x]  DC GOAL    12/27/21: Standing tolerance 15 min for participation in IADL's;     3. Patient will decrease time on 9 hole peg test by at least 5 seconds with the B UE in order to increase independence with clothing management.     Right hand: 47 seconds    Left hand: 43 seconds []  PROGRESS MADE/        CONTINUE GOAL  []  GOAL MET  [x]  DC GOAL    12/27/21: Issues with bilateral hand neuropathy impacting coordination and hand function.    Dynamometer:   right :60,65,60  Left: 55,55,55     9 hole peg test:   right 38 sec  Left :43 seconds         4. Pt will be educated on accessibility features to improve usage. Limited by neuropathy, states texting is very frustrating, decreased  accuracy       []  PROGRESS MADE/        CONTINUE GOAL  []  GOAL MET  [x]  DC GOAL    12/27/21: States he is using phone , texting INDEP with extra time.    5. Patient will complete cognitive and visual perceptual assessments in order to further assess safety with community mobility.     Is moving, would like to complete screening processes for returned to driving prior to upcoming move at end of november []  PROGRESS MADE/        CONTINUE GOAL  []  GOAL MET  [x]  DC GOAL    12/27/21: Partially met; Completed Cognitive and visual perceptual assessments to assess safety with community mobility.  Delayed processing and decreased neck ROM is a concern. Recommend in car assessment prior to return to driving.       Pain:0/10 Location:                            Precautions:none  Subjective:   Date:   12/27/21: Reports pain in neck, shoulders this session. Thinks he may have overdone speech exercises preparing for his MBS test tomorrow.   12/17/21: Reports completing HEPs from all 3 therapies has been challenging. States he is most motivated to complete swallowing HEPs so he can pass is swallow study on 11/28.   12/13/21: States he has back surgery scheduled @ 11/30.   12/10/21: Pt is doing well. States potential plan for surgery on 11/30  12/06/21: States he wants to extend OT visits through end of the year until he moves to [place]. Verbalized concerns over balance, activity stamina, cognition.  12/03/21: ADDENDUM: Pt states he is delaying move to [place] to January. Pt stating he would like to work on dynamic standing balance this date. Reports wife purchased [facility] activities/games that he has been using at home  11/29/2021 : States he is moving at end of November to [place]. Is frustrated with physical and cognitive decline since surgeries.     Treatment Session:     CPT code SKILLED INTERVENTIONS AND EDUCATION: Timed Code Treatment Minutes:    therapeutic activity The Trail Making Test (TMT) is a neuropsychological test of  visual attention and task switching.  It consists of two parts in which the subject is instructed to connect a set of 25 dots as  fast as possible while still maintaining accuracy. It can provide information about visual search speed, scanning, speed of processing, mental flexibility, as well as executive functioning.  Results for both TMT A and B are reported as the number of seconds required to complete the task; therefore, higher scores reveal greater impairment.      Trail A (29 seconds = average; ).  Trail B (75 seconds = average; ).  It is unnecessary to continue the test if the patient has not completed both parts after 5 minutes have elapsed.      Matthew [name] completed Trail Making A in 40 seconds which is in the DELAYED range.  his score on Trail Making B was 2:13 seconds which is in the DELAYED range .4 errors.    Reviewed community mobility assessments and return to driving process. Discussed patient may need extended mirrors on car due to neck ROM limitations. Also discussed concerns with cognitive processing delays, reassess post surgery.    25   therapeutic exercise Reassessment completed this session. DC from OT effective this date as patient is scheduled for surgery on Thurs, 11/30. Will reevaluate post surgery, new orders for therapy will be required.     In stance at arm bike 8 mins with min resistance.    Dynamomteter: right 60,65,60  Left: 55,55,55   9 hole peg test: right 38 sec  Left :43 seconds    -States he is using phone , texting INDEP with extra time.  30 ins    self-care/home management           Timed Treatment TIme:  55    Total Treatment TIme: 29     Patient Education:   Date:  12/27/21: DC OT ; community mobility process  12/17/21: balance training, processing time+recall   12/13/21: balance training,   12/10/21: balance training, IADL retraining, phone accessibility  12/06/21: stamina, balance and cognitive retraining  12/03/21: dynamic standing required for household task  and return to leisure activity  11/29/2021' return to drivingprocess  Education to: [x]  Patient []  Significant other   []  Caregiver []  other   Method: [x]  Explanation []  Demonstration   []  Handouts []  other   Response: [x]  Verbalized understanding []  Demonstrated understanding [x]  Needs reinforcement []  other     Plan:   Simulated IADL to address dynamic standing balance    This note serves as a discharge summary if the patient does not return to OCCUPATIONAL THERAPY per the above POC.       Therapist Signature: Tressia Miners, OTR/L  Date: 12/27/2021

## 2021-12-27 NOTE — Unmapped (Signed)
OUTPATIENT SPEECH LANGUAGE PATHOLOGY  DAILY NOTE    Name:Matthew [name] DOB:04-08-1945 UJW:[id number]  Today's Date: 12/27/2021  Referring Provider: Lorne SkeensEvan Zeldin  [address] Neurology   [facility] [place] [phone]     Primary Diagnosis:76 y/o male s/p C3-4 ACDF on 9/19 complicated by intraoperative CSF leak presents with progressive R neck swelling concerning for continued CSF leak. 9/30: underwent CSF leak repair and neck exploration.   Onset Date: 10/19/2021 first surgery with revision on  10/29/2021  Diagnosis:dysphagia, memory complaints  Insurance Plan:Payor: MEDICARE / Plan: MEDICARE A AND B / Product Type: Medicare /   # visits per insurance authorization:   11/23/2021/TDC VH/[id number]/BENEFIT AUTH INFO   PT,OT, ST -PLAN: MEDICARE PRIMARY  DRAKE IS IN NETWORK  PT & ST GET $2230 COMBINED PCY USED- $1266.95 POSSIBLE 12 VISITS REMAINING  OT GETS $2230 PCY USED - $75.57 POSSIBLE 26 VISITS REMAINING  CO-INS : 80%  Date of initial eval: 11/25/2021  # visits per POC: 10  Date of initial POC Certification: 11/25/2021  Verified Signed POC Certification:  Yes: x  on 11/26/2021     Date of POC Recertification: n/a  Verified Signed POC Recertification: 12/03/2021   Date of Progress Report (every 10 visits):     POC Recertification due(Medicaid 60 days/Medicare 90 days): 02/25/2022      Goals:  Goal 11/29/2021 12/03/2021 12/06/2021 11/10 12/13/2021 12/17/2021 12/27/2021   Complete MBS and add goals as needed.  Scheduled for 11/30/2021 at 10:30 Completed. MET Repeat scheduled for 11/28 Scheduled for 11/28 Scheduled for 11/28 Scheduled for 11/28; reviewed MBS protocol     Pt will complete swallow exercises focusing on pharyngeal /laryngeal strengthening (hyolarygneal elevation/ excursion, BOT, and pharyngeal stripping) with 100% accuracy as HEP.  New goal Effortful swallow x10, Masako x10, Mendelsohn x10, and CTAR (spring device with red) x10 Effortful using CTAR device x 10 x 2 trials  IOPI: Held target of 75% of max  for 2-3 sec x 10 trials Effortful using CTAR device x 10     IOPI not targeted this session    Masako 5 times, modified due to difficulties completing    Mendelsohn x5 to 100%    [employer]rain Bar w/ effortful swallow, liquid wash then cough x2 to 100% IOPI x 10 with tongue tip (peak of 41kPa today and tongue blade, 45 kPa.    PEP with breather today set at 21 IOPI x 10 with tongue tip (at 35)     MAX of tongue tip 45 kPa and MAX tongue blade, 48 kPa.    PEP with breather 4 sets of 5 to 90%; cued pt to shorten breaths    Effortful using CTAR device x 5 to 100%    Mendelsohn x3 to 100%    [employer] Cookie w/ effortful swallow, throat clear x 4 to 100%   Pt will complete MBS in 2-3 wks  To be scheduled To be scheduled 11/28 11/28 11/28  11/28   Pt will demonstrate swallow strategies independently  Demonstrated small sips and throat clear Discussed Met Met met met   Patient/caregivers will demonstrate understanding of role of speech therapy diagnosis, prognosis, home exercise programs, plan of care, recommendations, and safety precautions. ongoing ongoing ongoing ongoing ongoing ongoing ongoing                                             ______________________________________________________________________  Visit #:  7 Charges: 92526  Total Minutes: 41    Pt arrived unaccompanied. No new complaints. Pt tolerated thin by cup and cookie today. Pt has MBS tomorrow at 11:30.     Repeat all exercises 10 times/ 3 times/day.  - Pt to continue to complete HEP plus expiratory muscle trainer pending MBS results.     Effortful swallow: Swallow as hard as you can like you are swallowing a golf ball while pushing down on a ball (CTAR).       Push the tongue bulb on the roof of your mouth (front) and hold there for a count of 5    Push the tongue bulb on the roof of your mouth (middle/back) and hold there for a count of 5    Complete PEP at 5x x 5 sets for total of 25.      Education Addressed During This Session: Importance of swallow  exercises, s/s of aspiration, upcoming MBS protocol     Plan:  repeat MBS and continue therapy based on MBS results       This note serves as a discharge summary if the patient does not return to Speech Therapy per the above POC.    Christy Sartorius, B.S.   Graduate Student [employer]     I was present and/or available for the entire session and agree with all documentation.       Jola Baptist MA CCC-SLP [phone]  Phone number [phone]  Email:  [email or web address]

## 2021-12-27 NOTE — Unmapped (Signed)
PT DAILY TREATMENT NOTE // PROGRESS NOTE // DISCHARGE SUMMARY    Diagnosis:   1. Ankylosing hyperostosis (forestier), cervical region  AMB Follow-up to Physical Therapy      2. Encounter for other orthopedic aftercare  AMB Follow-up to Physical Therapy      3. Impaired functional mobility, balance, gait, and endurance  AMB Follow-up to Physical Therapy      4. Muscle weakness  AMB Follow-up to Physical Therapy              Referring Provider:Zeldin, Floyce Stakes, MD    Insurance plan:   Payer/Plan Subscr DOB Sex Relation Sub. Ins. ID Effective Group Num   1. MEDICARE - Matthew [name]* 12-28-1945 Male Self [id number] 10/02/11                                    [address]   2. Matthew [name]* 1945/11/24 Male Self [id number] 10/01/13                                    [address]                         # of visits per insurance authorization:   PT,OT, ST -PLAN: MEDICARE PRIMARY  DRAKE IS IN NETWORK     PT OT SP-  PATIENT HAS [employer] SUPPLEMENT INS SECONDARY  ONLY PAYS IF MEDICARE PAYS    # of visits per POC: Eval + 12 visits    Date of Initial Eval: 11/25/2021    Date of Last Progress Report: 12/27/21    Therapy Goals:   Short Term Goals: to be met by 2 weeks  Initial status (11/25/2021) 12/27/21   Pt will be compliant and independent with HEP in order to maintain progress made in physical therapy.  To be initiated   Goal met  Biking every day. Not performing other exercises    Pt will ambulate household distances without AD and no more than supervision in order to access home environment more independently.  Mod-I with RW; CGA-minA without AD Goal met  Ambulating around home with no AD, mod I          LongTerm Goals: to be met by time of discharge Initial status (11/25/2021) 12/17/2021 12/27/21   Pt will improve LE MMT to 5/5 in order to improve strength needed for functional tasks, body mechanics, and overall symptoms.  Hip flexion: Left: 4+/5 Right: 4+/5  Hip abduction: Left: 4/5 Right: 4+/5  Hip  extension: Left: 3+/5 Right: 3+/5  Goal not met, progress made in hip flexors and abductors  Hip flexion: Left: 5/5* Right: 5/5*  Hip abduction: Left: 4+/5*       Right: 5/5*  Hip extension: Left: 3/5 Right: 3/5  *assessed hip extension in prone   Pt will perform Timed Up and Go in <12 secs, without AD and with independence, in order to demonstrate decreased fall risk and appropriate safety and independence with functional mobility.  10.81 with mod-I and RW  17.1 without AD, CGA  Goal met  10.0 seconds with no AD, mod I    Pt will improve distance on 6 Minute Walk Test by at least 164 ft, ambulating without AD and no more than SPV, in order to achieve MCID/MDC, improve endurance,  and improve community mobility.  1032' without AD, minA  Goal not met  Progressed  1112' with supervision, no AD   Pt will participate in one standardized balance/gait measure in order to assess fall risk and community mobility. Goal to be made accordingly.  To be initiated    12/06/2021: GOAL MET     NEW GOAL 12/06/2021: Pt will improve score on Berg Balance Scale by >/= 5 points in order to achieve MCID, improve balance, and decrease fall risk.  Initial 12/06/2021: 40/56 GOAL MET - 46/56 Goal previously met   NEW GOAL 12/06/2021: Pt will score >22/30 on Functional Gait Assessment in order to decrease fall risk and improve dynamic balance and ambulation.   Initial: 14/30 Progressing - 20/30 Goal not met  20/30       ASSESSMENT 12/27/21: Matthew [name] is a 76 year old male with chief complaint of impaired gait and mobility. Patient was seen for eval + 7 visits of physical therapy to assist with improving balance, reducing fall risk, improving BLE strength and activity tolerance. Patient has met all short term goals and 3/6 long term goals. Patient has made progress in ability to ambulate household distances independently with no AD and has reduced his fall risk per Berg. He continues to be a fall risk per FGA. Patient discharged from  physical therapy with home exercise program and education at this time. He plans to undergo additional surgery on cervical spine 11/30, and plans to return to OP PT pending clearance from surgeon.This note to serve as discharge summary.     Matthew [name], PT, DPT, NCS #[id number]    _____________________________________________________________________  Pain:0/10 Location: Neuropathic pain                        Precautions: Cervical surgery, fall risk, orthostasis     Activity Date: 11/25/2021 Date: 11/29/2021 Date: 12/03/2021 Date: 12/06/2021 12/10/2021 12/13/2021 12/17/2021 12/27/21    Visit # Eval, 1 2 3 4 5 6 7 8    Checked for POC Certification  Yes           Verified Signed POC:    YES[x]       NO[]            Date:  Attestation signed by Matthew Ross Zeldin, MD at 11/25/2021  4:01 PM  Rerouted to Referral Source for signature via: EMR[]   Fax[]    Mail[]       Date:  Routed to PCP (if other than referring) for signature via: EMR[]   Fax[]     Mail[]      Date:  Was POC updated?        YES[]         NO[]                            Date:        If yes, Verified Signed POC:      YES[]             NO[]            Date:      CPT code  Daily treatment record Timed Code Treatment Minutes     Neuromuscular Re-education: 97112       Therapeutic Exercise: 97110   Progress note completed this date. Goals reassessed. See above goal section for details.  -<MEASUREME<MEASUREMENT<MEASUREMENT<MEASUREMENT<MEASUREMENT<MEASUREMENT<MEASUREMENT<MEASUREMENT T>  -TUG  -MMT   -reviewed HEP verbally    Pt educated on progress made since initial evaluation  and progress made towards achieving PT goals.   20     Gait Training: 16109   As part of reassessment, assessed fall risk with gait tasks, see FGA for details        12/27/2021   Funtional Gait Assessment   Gait Level Surface Mild Impairment   Gait with Horizontal Head Turns Mild Impairment   Gait and Pivot Turn Normal   Gait with Narrow Base of Support Severe Impairment   Ambulating Backwards Mild Impairment   Change in Gait Speed Normal   Gait with Vertical Head Turns Normal   Step Over  Obstacle Mild Impairment   Gait with Closed Eyes Moderate Impairment   Steps Mild Impairment   Total Score out of 30 20         17    Response to Treatment See assessment.     Total timed coded treatment minutes:  37    Total treatment time (timed + untimed):  37       Additional Information:   12/27/21: Pt reports he rode his stationary bike for 60 minutes at 48-49 RPM, with some resistance added. He reports he has been walking around home without AD. No falls since PT.   12/17/2021: Pt reports he feels like his strength and walking is getting better.   12/13/2021: Pt reports speaking to another neurosurgeon, who also supports an upcoming surgery d/t continued compression.   12/10/2021: Pt saw neurosurgeon this week and they are planning for a third surgery on November 30 d/t continued compression. First surgery was 9/19 (was doing well for a week), second was to correct CSF leak, etc on 9/30.    12/06/2021: Pt reported he was able to use the stationary bike over the weekend and walked laps.     Patient Education:   12/27/21: Discharge plan.   12/17/2021: Discussed fall risk indications. Educated on ambulating without device in open apartment hallway and inside apartment. Walker to be used in the community and when fatigued, in pain, or more unstable. Added bwd walking to HEP. Reviewed sensory integration HEP, focusing on head turns/nods, EC, trunk rotations, and other positions to challenge somatosensory and visual systems.   12/13/2021: Sidestepping and monster walk vs YTB at countertop added to HEP.   12/10/2021: Extensive time spent this session educating pt and pt's wife on the following: neuroanatomy; spinal cord and spinal tracts; motor coordination, timing, and fall risk; surgery and rehab indications; PT POC and goals.   12/06/2021: Educated on Louise results and safe use of hurrycane. Added pec stretch over towel roll and Thomas test to HEP.   12/03/2021: LTR and book openers added to HEP. Educated on  continuing to use AD.   11/29/2021: Discussed fall risk indicators with FGA score and importance of continuing to use AD. Discussed safe use of recumbent bike at home gym and how to slowly increase tolerance and time. Discussed performing as neural priming prior to walking practice at home. HEP: SL bridge, narrow stance with head turns/nods, staggered stance, fwd/bwd/side step in place.   11/25/2021: Pt educated on PT initial evaluation findings, PT plan of care, and follow-up treatment. Pt verbalized understanding. Educated on continuing to build walking endurance and aerobic conditioning with laps with RW. HEP including SL bridge for hip strengthening; handout provided. Discussed future safe gym interventions.     This note serves as a discharge summary if the patient does not return to Physical Therapy per the above POC.

## 2021-12-28 ENCOUNTER — Inpatient Hospital Stay
Admit: 2021-12-28 | Discharge: 2021-12-28 | Payer: MEDICARE | Attending: Student in an Organized Health Care Education/Training Program

## 2021-12-28 ENCOUNTER — Encounter: Admit: 2021-12-28 | Discharge: 2021-12-28 | Payer: MEDICARE

## 2021-12-28 DIAGNOSIS — R1312 Dysphagia, oropharyngeal phase: Secondary | ICD-10-CM

## 2021-12-28 MED ORDER — barium sulfate (VARIBAR NECTAR) suspension 70 mL
40 | Freq: Once | ORAL | Status: AC | PRN
Start: 2021-12-28 — End: 2021-12-28
  Administered 2021-12-28: 17:00:00 70 mL via ORAL

## 2021-12-28 MED ORDER — VARIBAR HONEY (barium) suspension 5 mL
40 | Freq: Once | ORAL | Status: AC | PRN
Start: 2021-12-28 — End: 2021-12-28
  Administered 2021-12-28: 17:00:00 5 mL via ORAL

## 2021-12-28 MED ORDER — VARIBAR THIN (barium) Powd 70 mL
81 | Freq: Once | ORAL | Status: AC | PRN
Start: 2021-12-28 — End: 2021-12-28
  Administered 2021-12-28: 17:00:00 70 mL via ORAL

## 2021-12-28 MED ORDER — VARI-BAR (barium) Pste 3 teaspoonful
40 | Freq: Once | ORAL | Status: AC | PRN
Start: 2021-12-28 — End: 2021-12-28
  Administered 2021-12-28: 17:00:00 3 [tsp_us] via ORAL

## 2021-12-28 MED FILL — VARIBAR NECTAR 40 % (W/V), 30 % (W/W) ORAL SUSPENSION: 40 40 % (w/v) | ORAL | Qty: 240

## 2021-12-28 MED FILL — VARIBAR PUDDING 40 % (W/V), 30% (W/W) ORAL PASTE: 40 40 % (w/v), 30% (w/w) | ORAL | Qty: 230

## 2021-12-28 MED FILL — VARIBAR HONEY 40 % (W/V), 29% (W/W) ORAL SUSPENSION: 40 40 % (w/v) 29% (w/w) | ORAL | Qty: 250

## 2021-12-28 MED FILL — VARIBAR THIN LIQUID 81 % (W/W) ORAL POWDER: 81 81 % (w/w) | ORAL | Qty: 70

## 2021-12-28 NOTE — Unmapped (Signed)
OUTPATIENT SPEECH LANGUAGE PATHOLOGY EVALUATION  Modified Barium Swallow  [facility]    Patient:  Matthew [name] DOB:  [birth year] MRN:  [id number]    Date: 12/28/2021 Onset Date:  10/19/2021 first surgery with revision on  10/29/2021  Prior Therapy:  Currently in OP Speech Therapy     Medical/Rehab Diagnosis: No diagnosis found.    Referring Physician: Hurshel Keys, MD  [address]  Neurology  [place],  [place] [address] Phone: [phone]  Fax: [phone]     Insurance: Payor: MEDICARE / Plan: MEDICARE A AND B / Product Type: Medicare /       PMH: Reviewed past medical history in the chart.   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  See below for contact information.    Summary:  Patient presents with  oropharyngeal dysphagia secondary to the following deficits: reduced hyolaryngeal elevation/excursion, incomplete vestibule closure, reduced pharyngeal stripping wave, reduced laryngeal sensation.     This resulted in:  oral residue, pharyngeal residue, penetration with thin and nectar thick liquids     The following aspects were WFL:  Oral phase, BOT retraction,     From MBS on 11/30/2021 per Lindsay's note:  Pt presents with oropharyngeal dysphagia with Grade 3 swallow safety and swallow efficiency on the DIGEST scale characterized by reduced hyolaryngeal elevation/excursion, reduced base of tongue, reduced posterior pharyngeal stripping wave, reduced PES duration/distention. This results in chronic/ not gross aspiration of thin  liquids (silent) with single sips in 5 mL via tsp (trace) and 20 mL sequential sips. Pt has disk syndrome which inhibits neck ROM. He was able to complete a slight chin tuck with thin via cup/ 15mL with neither trace nor gross aspiration (PAS 3).  Pt has large quantity disperse residue in pharynx after the swallow with honey, puree, and solid food consistencies. There is noted trace aspiration from posterior commissure after multiple dry swallows with puree consistency from residue in pyriform sinuses in which the pt had a spontaneous cough. A liquid wash did assist in clearing pharyngeal residue; however there was some remaining.     Recommendations:  Diet: regular/thin  Medications: whole w/ thin  Compensatory techniques recommended: HEP from OP speech therapy (CTAR, effortful swallow, Masako)  Treatment recommendations: d/c from OP therapy due to upcoming surgery  Consults: na  Repeat instrumental recommended before diet upgrade: see OP Speech Therapy after surgery    Current status:  Current Diet: D3/thin  Orientation: adequate for exam  Respiratory status: room air  Pain Scale: no pain reported  Dentition: natural  PMH / related diagnoses: Dish syndrome  Reason for exam: dysphagia  Current SLP intervention: d/c from OP Speech Therapy; Follow up after surgery with new referral from  doctor.       Before the procedure, the patient was identified by name and DOB. Pt was seated in a regular chair. Pt was viewed in the lateral and AP position. Pt was alert and tolerated the procedure well.         Boluses, in order of   presentation Thin by tsp (5mL) Thin by tsp (5mL) Thin by cup 20mL Thin by cup, 40 mL,  Consecutive swallows Nectar by tsp  5mL Nectar by cup (20mL) Nectar by cup 40 ML, cosecutive swallow Honey by tsp (5mL) Puree Cookie Nectar by tsp  (AP view) Puree   (AP)   Compensatory Strategy/ Effectiveness na na na na na na na na na na na na   Oral phase  Reduced lip closure  Reduced AP propulsion  Atypical  mastication pattern  Piecemealing  Reduced bolus control  Reduced bolus formation  Oral residue   [facility] WFL   Swallow triggered at level of:  V- valleculae  L- laryngeal surface of epiglottis  P- pyriform sinuses  A-airway  NONE- no visible initiation P V V V V V V V V V WFL WFL   Residue:  BOT-base of tongue  V- valleculae  PW- pharyngeal wall  P- pyriform sinus   D - diffuse  See rating scale below     P-1 V-1  P-1 V-1  P-1 V-1 V-1 V-1 V-1 V-1  P-1 V-1  P-1 V-1  P-1 none none   Penetration/Aspiration Scale Score 1 2 6  (during and after swallow) 2 1 1 2 1 1 1 1 1          Cervical Esophageal Phase  [facility]      Treatment Note: Immediately following the MBS, the SLP completed a tx session with the pt. SLP provided pt with detailed education regarding results of MBS, diet recommendations and compensatory strategies. Plan to d/c pt from OP Speech therapy due to upcoming surgery and follow up with speech therapy as needed with new referral from doctor.     Education:   Education was completed to Hospital doctor re: the results, recommendations, and POC.  Patient response: displayed understanding         The Yale Pharyngeal Residue Severity Rating Scale          Definitions for severity of vallecular residue   None 0% No residue   Trace 1-5% Trace coating of the mucosa   Mild 5-25% Epiglottic ligament visible   Moderate 25-50% Epiglottic Ligament covered   Severe >50% Filled to epiglottic rim              Definition for severity of pyriform sinus residue   None 0% No residue   Trace 1-5% Trace coating of the mucosa   Mild 5-25% Up wall to quarter full   Moderate 25-50% Up wall to half full   Severe >50% Filled to aryepiglottic fold            Pen Asp Scale Score     Pen Asp   Score            Description of Events  1.            Material does not enter airway  2.            Material enters the airway, remains above the vocal folds,  and is ejected from the airway.  3.             Material enters the airway, remains above the vocal folds,                  and is not ejected from the airway.  4             Material enters the airway, contacts the vocal folds,                  and is ejected from the airway.  5.            Material enters the airway, contacts the vocal folds,                  and is not ejected from the airway.  6.            Material enters the airway, passes below the vocal folds,                  and is ejected into the larynx or out of the airway.  7.            Material enters the airway, passes below the vocal folds,                 and is not ejected from the trachea despite effort.  8.           Material enters the airway, passes below the vocal folds,      and no effort is made to eject.    Video is archived in the PACS system and given to pt and wife.                       Total Minutes: 30  Charge: 92611     POC Treatment Areas CPT Codes POC Assessment Areas CPT Codes   Speech, Language and VoiceTx Codes []  92507   Speech, Language and Voice Assessment Codes []  92521  []  92522  []  92523  []  92524  []  96105  []  92616  []  92511    Dysphagia Tx Codes []  92526   Dysphagia Assessment Codes []  16109  [x]  92611  []  92612   Cognition Tx Codes []  97129  []  97130   Cognition Assessment Codes []  96125   AAC Tx Codes []  92609   AAC Assessment Codes []  92607  []  92608   Group Tx Codes []  92508         Therapist Signature: Christy Sartorius, B.S. Graduate Student Clinician            Date: 12/28/2021  Cherie Ouch, MS, CCC/SLP  SNF Speech Language Pathologist  [facility]  [address]  [place] [address]  Phone: [phone]  Fax: [phone]  [email or web address]    Physician Certification:   I certify that the above patient is under my care and requires the above services.  These professional services are to be provided from an established plan, related to the diagnosis and reviewed by me every 10th visits or every 90 days, whichever  occurs first.    Physician Comments/Revisions:    Physician Name (printed):   Zeldin, Floyce Stakes, MD  Physician Signature: ___________________________________

## 2021-12-30 ENCOUNTER — Ambulatory Visit: Admit: 2021-12-30 | Payer: MEDICARE

## 2021-12-30 ENCOUNTER — Inpatient Hospital Stay
Admit: 2021-12-30 | Discharge: 2021-12-31 | Disposition: A | Discharge status: Home / Self Care | Payer: MEDICARE | Source: Ambulatory Visit

## 2021-12-30 DIAGNOSIS — M4712 Other spondylosis with myelopathy, cervical region: Secondary | ICD-10-CM

## 2021-12-30 DIAGNOSIS — M4802 Spinal stenosis, cervical region: Secondary | ICD-10-CM

## 2021-12-30 DIAGNOSIS — L89890 Pressure ulcer of other site, unstageable: Secondary | ICD-10-CM

## 2021-12-30 LAB — CBC
Hematocrit: 33.4 % — ABNORMAL LOW (ref 38.5–50.0)
Hemoglobin: 11.5 g/dL — ABNORMAL LOW (ref 13.2–17.1)
MCH: 32.6 pg (ref 27.0–33.0)
MCHC: 34.3 g/dL (ref 32.0–36.0)
MCV: 95.2 fL (ref 80.0–100.0)
MPV: 8.7 fL (ref 7.5–11.5)
Platelets: 124 10*3/uL — ABNORMAL LOW (ref 140–400)
RBC: 3.51 10*6/uL — ABNORMAL LOW (ref 4.20–5.80)
RDW: 14.3 % (ref 11.0–15.0)
WBC: 5.4 10*3/uL (ref 3.8–10.8)

## 2021-12-30 LAB — BASIC METABOLIC PANEL
Anion Gap: 6 mmol/L (ref 3–16)
BUN: 19 mg/dL (ref 7–25)
CO2: 30 mmol/L (ref 21–33)
Calcium: 7.9 mg/dL — ABNORMAL LOW (ref 8.6–10.3)
Chloride: 106 mmol/L (ref 98–110)
Creatinine: 0.64 mg/dL (ref 0.60–1.30)
EGFR: >90
Glucose: 112 mg/dL — ABNORMAL HIGH (ref 70–100)
Osmolality, Calculated: 297 mOsm/kg (ref 278–305)
Potassium: 4.1 mmol/L (ref 3.5–5.3)
Sodium: 142 mmol/L (ref 133–146)

## 2021-12-30 LAB — BLOOD GAS, ARTERIAL
%HBO2, Arterial: 98.9 % (ref 95.0–98.0)
Base Excess, Arterial: 3.7 mmol/L (ref <=3.0)
CO2 Content,Arterial: 30 mmol/L (ref 23–27)
Carboxyhemoglobin, Arterial: 1.1 %
HCO3, Arterial: 29 mmol/L (ref 22–26)
Methemoglobin, Arterial: 0.6 % (ref 0.0–1.5)
Reduced hemoglobin, Arterial: <2.4 % (ref 0.0–5.0)
pCO2, Arterial: 43 mm Hg (ref 35–45)
pH, Arterial: 7.43 (ref 7.35–7.45)
pO2, Arterial: 231 mm Hg (ref 80–100)

## 2021-12-30 LAB — FREE CALCIUM, WHOLE BLOOD: Free Calcium, WB: 4.75 mg/dL (ref 4.50–5.30)

## 2021-12-30 LAB — MAGNESIUM: Magnesium: 2 mg/dL (ref 1.5–2.5)

## 2021-12-30 LAB — HEMATOCRIT, BLOOD GAS: Hct, blood gas: 37.2 % — ABNORMAL LOW (ref 40–52)

## 2021-12-30 LAB — SODIUM, BLOOD GAS: Sodium, Blood Gas: 142 meq/L (ref 136–146)

## 2021-12-30 LAB — HEMOGLOBIN, BLOOD GAS: Hgb, blood gas: 12.1 g/dL (ref 14.0–18.0)

## 2021-12-30 LAB — POC GLU MONITORING DEVICE
POC Glucose Monitoring Device: 55 mg/dL — ABNORMAL LOW (ref 70–100)
POC Glucose Monitoring Device: 77 mg/dL (ref 70–100)
POC Glucose Monitoring Device: 94 mg/dL (ref 70–100)

## 2021-12-30 LAB — FIBRINOGEN: Fibrinogen: 306 mg/dL (ref 218–406)

## 2021-12-30 LAB — LACTIC ACID, ARTERIAL, WHOLE BLOOD: Lactate, Art: 0.9 mmol/L (ref 0.5–1.6)

## 2021-12-30 LAB — POTASSIUM, BLOOD GAS: Potassium, Blood Gas: 4 meq/L (ref 3.5–5.3)

## 2021-12-30 LAB — GLUCOSE, BLOOD GAS: Glucose, Blood Gas: 104 mg/dL — ABNORMAL HIGH (ref 70–100)

## 2021-12-30 LAB — PHOSPHORUS: Phosphorus: 3.2 mg/dL (ref 2.1–4.5)

## 2021-12-30 MED ORDER — HYDROmorphone (DILAUDID) injection Syrg 0.2 mg
0.5 | INTRAMUSCULAR | PRN
Start: 2021-12-30 — End: 2021-12-30

## 2021-12-30 MED ORDER — finasteride (PROSCAR) tablet 5 mg
5 | Freq: Every day | ORAL
Start: 2021-12-30 — End: 2021-12-31
  Administered 2021-12-31: 13:00:00 5 mg via ORAL

## 2021-12-30 MED ORDER — electrolyte-R (pH 7.4) (NORMOSOL-R pH 7.4) IV solution
INTRAVENOUS | PRN
Start: 2021-12-30 — End: 2021-12-30
  Administered 2021-12-30 (×2): via INTRAVENOUS

## 2021-12-30 MED ORDER — EPINEPHrine 5 mcg/mL in D5W 10 mL IV 5 mcg/ml syringe
5 | INTRAVENOUS | Status: AC
Start: 2021-12-30 — End: ?

## 2021-12-30 MED ORDER — fentaNYL (SUBLIMAZE) injection
50 | INTRAMUSCULAR | PRN
Start: 2021-12-30 — End: 2021-12-30
  Administered 2021-12-30 (×2): 50 via INTRAVENOUS

## 2021-12-30 MED ORDER — fentaNYL (SUBLIMAZE) 50 mcg/mL injection
50 | INTRAMUSCULAR | Status: AC
Start: 2021-12-30 — End: ?

## 2021-12-30 MED ORDER — succinylcholine (QUELICIN) injection
20 | INTRAMUSCULAR | PRN
Start: 2021-12-30 — End: 2021-12-30
  Administered 2021-12-30: 19:00:00 140 via INTRAVENOUS

## 2021-12-30 MED ORDER — sugammadex (BRIDION) IV solution
100 | INTRAVENOUS | PRN
Start: 2021-12-30 — End: 2021-12-30
  Administered 2021-12-30: 21:00:00 200 via INTRAVENOUS

## 2021-12-30 MED ORDER — dextrose 10%-water (D10W) IV soln
INTRAVENOUS | PRN
Start: 2021-12-30 — End: 2021-12-30

## 2021-12-30 MED ORDER — lactated Ringers IV infusion
INTRAVENOUS
Start: 2021-12-30 — End: 2021-12-30

## 2021-12-30 MED ORDER — sodium chloride 0.9 % irrigation
0.9 | PRN
Start: 2021-12-30 — End: 2021-12-30
  Administered 2021-12-30: 20:00:00 1000

## 2021-12-30 MED ORDER — propofol (DIPRIVAN) infusion 10 mg/mL
10 | INTRAVENOUS | PRN
Start: 2021-12-30 — End: 2021-12-30
  Administered 2021-12-30: 19:00:00 125 via INTRAVENOUS

## 2021-12-30 MED ORDER — ondansetron (ZOFRAN) 4 mg/2 mL injection
4 | INTRAMUSCULAR | Status: AC
Start: 2021-12-30 — End: ?

## 2021-12-30 MED ORDER — peppermint oiL liquid 1 mL
PRN
Start: 2021-12-30 — End: 2021-12-30

## 2021-12-30 MED ORDER — ondansetron (ZOFRAN) injection 4 mg
4 | Freq: Three times a day (TID) | INTRAMUSCULAR | PRN
Start: 2021-12-30 — End: 2021-12-30

## 2021-12-30 MED ORDER — vancomycin (VANCOCIN) 20 mg/kg = 1,500 mg in sodium chloride 0.9 % 250 mL IVPB
INTRAVENOUS | Status: AC | PRN
Start: 2021-12-30 — End: 2021-12-30
  Administered 2021-12-30: 19:00:00 1500 mg/kg via INTRAVENOUS

## 2021-12-30 MED ORDER — naloxone (NARCAN) injection 0.04 mg
0.4 | INTRAMUSCULAR | PRN
Start: 2021-12-30 — End: 2021-12-30

## 2021-12-30 MED ORDER — polyethylene glycol (MIRALAX) packet 17 g
17 | Freq: Two times a day (BID) | ORAL | PRN
Start: 2021-12-30 — End: 2021-12-31

## 2021-12-30 MED ORDER — glucose chewable tablet 12 g
4 | ORAL | PRN
Start: 2021-12-30 — End: 2021-12-30

## 2021-12-30 MED ORDER — oxyCODONE (ROXICODONE) immediate release tablet 5 mg
5 | ORAL | PRN
Start: 2021-12-30 — End: 2021-12-31
  Administered 2021-12-31: 10:00:00 5 mg via ORAL

## 2021-12-30 MED ORDER — lactated Ringers IV infusion
INTRAVENOUS | PRN
Start: 2021-12-30 — End: 2021-12-30
  Administered 2021-12-30: 19:00:00 via INTRAVENOUS

## 2021-12-30 MED ORDER — lidocaine (LIDODERM) 5 % 1 patch
5 | Freq: Every day | TOPICAL
Start: 2021-12-30 — End: 2021-12-31
  Administered 2021-12-31: 14:00:00 1 via TRANSDERMAL

## 2021-12-30 MED ORDER — atorvastatin (LIPITOR) tablet 40 mg
40 | Freq: Every evening | ORAL
Start: 2021-12-30 — End: 2021-12-31
  Administered 2021-12-31: 01:00:00 40 mg via ORAL

## 2021-12-30 MED ORDER — acetaminophen (TYLENOL) tablet 975 mg
325 | ORAL | Status: AC | PRN
Start: 2021-12-30 — End: 2021-12-30
  Administered 2021-12-30: 19:00:00 975 mg via ORAL

## 2021-12-30 MED ORDER — methocarbamoL (ROBAXIN) 100 mg/mL injection
100 | INTRAMUSCULAR | Status: AC
Start: 2021-12-30 — End: ?

## 2021-12-30 MED ORDER — amLODIPine (NORVASC) tablet 5 mg
5 | Freq: Every day | ORAL
Start: 2021-12-30 — End: 2021-12-31
  Administered 2021-12-31: 13:00:00 5 mg via ORAL

## 2021-12-30 MED ORDER — fentaNYL (SUBLIMAZE) injection 12.5 mcg
50 | INTRAMUSCULAR | PRN
Start: 2021-12-30 — End: 2021-12-30

## 2021-12-30 MED ORDER — proMETHazine (PHENERGAN) tablet 25 mg
25 | Freq: Four times a day (QID) | ORAL | PRN
Start: 2021-12-30 — End: 2021-12-30

## 2021-12-30 MED ORDER — cetirizine (ZYRTEC) tablet 10 mg
10 | Freq: Every day | ORAL
Start: 2021-12-30 — End: 2021-12-31
  Administered 2021-12-31: 13:00:00 10 mg via ORAL

## 2021-12-30 MED ORDER — fluticasone propionate (FLONASE) 50 mcg/actuation nasal spray 1 spray
50 | Freq: Every day | NASAL
Start: 2021-12-30 — End: 2021-12-31
  Administered 2021-12-31: 13:00:00 1 via NASAL

## 2021-12-30 MED ORDER — sugammadex (BRIDION) 100 mg/mL IV solution
100 | INTRAVENOUS | Status: AC
Start: 2021-12-30 — End: ?

## 2021-12-30 MED ORDER — methocarbamoL (ROBAXIN) tablet 750 mg
750 | Freq: Four times a day (QID) | ORAL
Start: 2021-12-30 — End: 2021-12-31

## 2021-12-30 MED ORDER — vancomycin (VANCOCIN) 1,250 mg in sodium chloride 0.9% 250 mL ADDaptor IVPB
1.25 | Freq: Two times a day (BID) | INTRAVENOUS
Start: 2021-12-30 — End: 2021-12-31
  Administered 2021-12-31: 05:00:00 1250 mg/kg via INTRAVENOUS

## 2021-12-30 MED ORDER — ceFAZolin (ANCEF) IVPB 2 g in D5W (duplex)
2 | INTRAVENOUS | Status: AC | PRN
Start: 2021-12-30 — End: 2021-12-30
  Administered 2021-12-30: 19:00:00 2 g via INTRAVENOUS

## 2021-12-30 MED ORDER — DULoxetine (CYMBALTA) DR capsule 60 mg
60 | Freq: Every day | ORAL
Start: 2021-12-30 — End: 2021-12-31
  Administered 2021-12-31: 13:00:00 60 mg via ORAL

## 2021-12-30 MED ORDER — EPINEPHrine 50 MCG/ 10 ML SYRINGE FOR BRADYCARDIA
PRN
Start: 2021-12-30 — End: 2021-12-30
  Administered 2021-12-30 (×3): 5 via INTRAVENOUS
  Administered 2021-12-30: 21:00:00 10 via INTRAVENOUS

## 2021-12-30 MED ORDER — dexamethasone (DECADRON) injection 4 mg
4 | Freq: Once | INTRAMUSCULAR | PRN
Start: 2021-12-30 — End: 2021-12-30

## 2021-12-30 MED ORDER — melatonin tablet Tab 9 mg
3 | Freq: Every evening | ORAL
Start: 2021-12-30 — End: 2021-12-31
  Administered 2021-12-31: 05:00:00 9 mg via ORAL

## 2021-12-30 MED ORDER — oxyCODONE (ROXICODONE) immediate release tablet 10 mg
5 | ORAL | PRN
Start: 2021-12-30 — End: 2021-12-31

## 2021-12-30 MED ORDER — timolol (TIMOPTIC) 0.5 % ophthalmic solution 1 drop
0.5 | Freq: Two times a day (BID) | OPHTHALMIC
Start: 2021-12-30 — End: 2021-12-31
  Administered 2021-12-31 (×2): 1 [drp] via OPHTHALMIC

## 2021-12-30 MED ORDER — glycopyrrolate (ROBINUL) injection
0.2 | INTRAMUSCULAR | PRN
Start: 2021-12-30 — End: 2021-12-30
  Administered 2021-12-30: 20:00:00 .4 via INTRAVENOUS

## 2021-12-30 MED ORDER — acetaminophen (TYLENOL) tablet 975 mg
325 | Freq: Three times a day (TID) | ORAL
Start: 2021-12-30 — End: 2021-12-31
  Administered 2021-12-31 (×2): 975 mg via ORAL

## 2021-12-30 MED ORDER — fentaNYL (SUBLIMAZE) injection 25 mcg
50 | INTRAMUSCULAR | PRN
Start: 2021-12-30 — End: 2021-12-30

## 2021-12-30 MED ORDER — rocuronium (ZEMURON) injection
10 | INTRAVENOUS | PRN
Start: 2021-12-30 — End: 2021-12-30
  Administered 2021-12-30: 20:00:00 50 via INTRAVENOUS

## 2021-12-30 MED ORDER — pregabalin (LYRICA) capsule 150 mg
150 | Freq: Every day | ORAL
Start: 2021-12-30 — End: 2021-12-31
  Administered 2021-12-31: 13:00:00 150 mg via ORAL

## 2021-12-30 MED ORDER — heparin (porcine) injection 5,000 Units
5000 | Freq: Three times a day (TID) | INTRAMUSCULAR
Start: 2021-12-30 — End: 2021-12-31
  Administered 2021-12-31: 10:00:00 5000 [IU] via SUBCUTANEOUS

## 2021-12-30 MED ORDER — pregabalin (LYRICA) capsule 300 mg
75 | Freq: Every evening | ORAL
Start: 2021-12-30 — End: 2021-12-31
  Administered 2021-12-31: 05:00:00 300 mg via ORAL

## 2021-12-30 MED ORDER — thrombin (bovine) topical solution
5000 | TOPICAL | PRN
Start: 2021-12-30 — End: 2021-12-30
  Administered 2021-12-30: 20:00:00 5000 via TOPICAL

## 2021-12-30 MED ORDER — vancomycin (VANCOCIN) injection
1000 | INTRAVENOUS | PRN
Start: 2021-12-30 — End: 2021-12-30
  Administered 2021-12-30: 21:00:00 1000 via TOPICAL

## 2021-12-30 MED ORDER — latanoprost (XALATAN) 0.005 % ophthalmic solution 1 drop
0.005 | Freq: Every evening | OPHTHALMIC
Start: 2021-12-30 — End: 2021-12-31

## 2021-12-30 MED ORDER — oxyCODONE (ROXICODONE) immediate release tablet 2.5 mg
5 | ORAL | PRN
Start: 2021-12-30 — End: 2021-12-30

## 2021-12-30 MED ORDER — propofol 10 mg/ml (DIPRIVAN) injection
10 | INTRAVENOUS | PRN
Start: 2021-12-30 — End: 2021-12-30
  Administered 2021-12-30: 20:00:00 50 via INTRAVENOUS
  Administered 2021-12-30: 19:00:00 160 via INTRAVENOUS

## 2021-12-30 MED ORDER — HYDROmorphone (DILAUDID) injection Syrg 0.5 mg
0.5 | INTRAMUSCULAR | PRN
Start: 2021-12-30 — End: 2021-12-30
  Administered 2021-12-30: 23:00:00 0.5 mg via INTRAVENOUS

## 2021-12-30 MED ORDER — oxyCODONE (ROXICODONE) immediate release tablet 5 mg
5 | ORAL | PRN
Start: 2021-12-30 — End: 2021-12-30
  Administered 2021-12-30: 23:00:00 5 mg via ORAL

## 2021-12-30 MED ORDER — remifentaniL (ULTIVA) 2 mg in sodium chloride 0.9 % 40 mL IV
1 | INTRAVENOUS | PRN
Start: 2021-12-30 — End: 2021-12-30
  Administered 2021-12-30: 19:00:00 .125 via INTRAVENOUS

## 2021-12-30 MED ORDER — phenylephrine (NEO-SYNEPHRINE) 10 mg in sodium chloride 0.9 % 100 mL infusion
10 | INTRAMUSCULAR | PRN
Start: 2021-12-30 — End: 2021-12-30
  Administered 2021-12-30: 20:00:00 25 via INTRAVENOUS

## 2021-12-30 MED ORDER — HYDROmorphone (DILAUDID) injection Syrg 0.5 mg
0.5 | INTRAMUSCULAR | PRN
Start: 2021-12-30 — End: 2021-12-31

## 2021-12-30 MED ORDER — sodium chloride 0.9 % infusion
INTRAVENOUS
Start: 2021-12-30 — End: 2021-12-31
  Administered 2021-12-31: 03:00:00 75 mL/h via INTRAVENOUS

## 2021-12-30 MED ORDER — phenylephrine (NEO-SYNEPHRINE) injection
10 | INTRAMUSCULAR | PRN
Start: 2021-12-30 — End: 2021-12-30
  Administered 2021-12-30: 20:00:00 100 via INTRAVENOUS

## 2021-12-30 MED ORDER — vancomycin (VANCOCIN) 1,250 mg in sodium chloride 0.9% 250 mL ADDaptor IVPB
1.25 | Freq: Two times a day (BID) | INTRAVENOUS
Start: 2021-12-30 — End: 2021-12-30

## 2021-12-30 MED ORDER — celecoxib (CELEBREX) capsule 200 mg
200 | Freq: Two times a day (BID) | ORAL | Status: AC
Start: 2021-12-30 — End: 2021-12-31
  Administered 2021-12-31 (×2): 200 mg via ORAL

## 2021-12-30 MED ORDER — HYDROmorphone (DILAUDID) injection Syrg 1 mg
1 | INTRAMUSCULAR | PRN
Start: 2021-12-30 — End: 2021-12-31

## 2021-12-30 MED ORDER — brimonidine (ALPHAGAN) 0.2 % ophthalmic solution 1 drop
0.2 | Freq: Two times a day (BID) | OPHTHALMIC
Start: 2021-12-30 — End: 2021-12-31
  Administered 2021-12-31: 14:00:00 1 [drp] via OPHTHALMIC

## 2021-12-30 MED ORDER — remifentaniL (ULTIVA) 2 mg injection
2 | INTRAVENOUS | Status: AC
Start: 2021-12-30 — End: ?

## 2021-12-30 MED ORDER — methocarbamoL (ROBAXIN) injection 1,000 mg
100 | Freq: Three times a day (TID) | INTRAMUSCULAR
Start: 2021-12-30 — End: 2021-12-31
  Administered 2021-12-31 (×2): 1000 mg via INTRAVENOUS

## 2021-12-30 MED ORDER — ondansetron (ZOFRAN) injection
4 | INTRAMUSCULAR | PRN
Start: 2021-12-30 — End: 2021-12-30
  Administered 2021-12-30: 22:00:00 4 via INTRAVENOUS

## 2021-12-30 MED ORDER — methocarbamoL (ROBAXIN) injection
100 | INTRAMUSCULAR | PRN
Start: 2021-12-30 — End: 2021-12-30
  Administered 2021-12-30: 22:00:00 500 via INTRAVENOUS

## 2021-12-30 MED ORDER — propofol (DIPRIVAN) infusion 10 mg/mL ADS Med
10 | INTRAVENOUS | Status: AC
Start: 2021-12-30 — End: ?

## 2021-12-30 MED ORDER — lidocaine (PF) 2% (20 mg/mL) Soln 20 mg
20 | Freq: Once | INTRAMUSCULAR | PRN
Start: 2021-12-30 — End: 2021-12-30

## 2021-12-30 MED ORDER — senna-docusate (SENNA-S) 8.6-50 mg per tablet 1 tablet
8.6-50 | Freq: Two times a day (BID) | ORAL
Start: 2021-12-30 — End: 2021-12-31
  Administered 2021-12-31 (×2): 1 via ORAL

## 2021-12-30 MED ORDER — dexAMETHasone (DECADRON) tablet 8 mg
4 | Freq: Once | ORAL | Status: AC
Start: 2021-12-30 — End: 2021-12-31
  Administered 2021-12-31: 13:00:00 8 mg via ORAL

## 2021-12-30 MED ORDER — lidocaine (PF) 20 mg/mL (2 %) Soln
20 | INTRAVENOUS | PRN
Start: 2021-12-30 — End: 2021-12-30
  Administered 2021-12-30: 19:00:00 80 via INTRAVENOUS

## 2021-12-30 MED FILL — VANCOMYCIN 1,000 MG INTRAVENOUS INJECTION: 1000 1000 mg | INTRAVENOUS | Qty: 1500

## 2021-12-30 MED FILL — FENTANYL (PF) 50 MCG/ML INJECTION SOLUTION: 50 50 mcg/mL | INTRAMUSCULAR | Qty: 2

## 2021-12-30 MED FILL — OXYCODONE 5 MG TABLET: 5 5 MG | ORAL | Qty: 1

## 2021-12-30 MED FILL — ROBAXIN 100 MG/ML INJECTION SOLUTION: 100 100 mg/mL | INTRAMUSCULAR | Qty: 10

## 2021-12-30 MED FILL — TYLENOL 325 MG TABLET: 325 325 mg | ORAL | Qty: 3

## 2021-12-30 MED FILL — DIPRIVAN 10 MG/ML INTRAVENOUS EMULSION: 10 10 mg/mL | INTRAVENOUS | Qty: 100

## 2021-12-30 MED FILL — CEFAZOLIN 2 GRAM/50 ML IN DEXTROSE (ISO-OSMOTIC) INTRAVENOUS PIGGYBACK: 2 2 gram/50 mL | INTRAVENOUS | Qty: 50

## 2021-12-30 MED FILL — BRIDION 100 MG/ML INTRAVENOUS SOLUTION: 100 100 mg/mL | INTRAVENOUS | Qty: 2

## 2021-12-30 MED FILL — HYDROMORPHONE 0.5 MG/0.5 ML INJECTION SYRINGE: 0.5 0.5 mg/0.5 mL | INTRAMUSCULAR | Qty: 0.5

## 2021-12-30 MED FILL — ULTIVA 2 MG INTRAVENOUS SOLUTION: 2 2 mg | INTRAVENOUS | Qty: 5

## 2021-12-30 MED FILL — ONDANSETRON HCL (PF) 4 MG/2 ML INJECTION SOLUTION: 4 4 mg/2 mL | INTRAMUSCULAR | Qty: 2

## 2021-12-30 MED FILL — EPINEPHRINE 5 MCG/ML IN D5W 10 ML IV SYRINGE: 5 5 mcg/ml | INTRAVENOUS | Qty: 10

## 2021-12-30 NOTE — Unmapped (Signed)
Anesthesia Extubation Criteria:    Airway Device: endotracheal tube    Emergence Details:      Smooth      _x_      Stormy       __       Prolonged   __     Extubation Criteria:      Motor strength intact       _x_      Follows commands        _x_      Good airway reflexes      _x_      OP suctioned                  _x_        Follows commands:  Yes     Patient extubated:  Yes

## 2021-12-30 NOTE — Unmapped (Signed)
Anesthesia Transfer of Care Note    Patient: Matthew [name]  Procedure(s) Performed: Procedure(s):  C3-C5 POSTERIOR CERVICAL LAMINECTOMY AND FUSION    Patient location: PACU    Anesthesia type: general endotracheal    Airway Device on Arrival to PACU/ICU: Nasal Cannula    IV Access: Peripheral    Monitors Recommended to be Used During PACU/ICU: Arterial Line and Standard Monitors    Outstanding Issues to Address: None    Level of Consciousness: awake, alert , and oriented    Post vital signs:    Vitals:    12/30/21 1740   BP: 128/77   Pulse: 75   Resp: 16   Temp: 98.0 F   SpO2: 99%       Complications:  No notable events documented.    Date 12/29/21 1500 - 12/30/21 0659(Not Admitted) 12/30/21 0700 - 12/31/21 0659   Shift 1500-2259 2300-0659 24 Hour Total 0700-1459 1500-2259 2300-0659 24 Hour Total   INTAKE   I.V.(mL/kg)     2400(31.3)  2400(31.3)     Volume (mL) (electrolyte-R (pH 7.4) (NORMOSOL-R pH 7.4) IV solution)     1700  1700     Volume (mL) (lactated Ringers IV infusion)     700  700   Shift Total(mL/kg)     2400(31.3)  2400(31.3)   OUTPUT   Urine(mL/kg/hr)    350(0.6) 275  625     Urine    350 275  625   Blood     300  300     Est Blood Loss     300  300   Shift Total(mL/kg)    350(4.6) 575(7.5)  925(12.1)   Weight (kg) 76.7 76.7 76.7 76.7 76.7 76.7 76.7

## 2021-12-30 NOTE — Unmapped (Signed)
[facility] OF [facility]  DEPARTMENT OF NEUROSURGERY  HISTORY AND PHYSICAL EXAMINATION  Kymir Coles  [id number]  March 14, 1945    HPI:  76 y.o. 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 He does report improved eating and his diet was recently advanced by SLP.       PMH:  Past Medical History:   Diagnosis Date    Abnormal gait     Cancer (CMS-HCC)     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    Environmental allergies     Hearing loss     left (since age 78), right more recent    High cholesterol     Hypertension     Neck symptom     Trouble turning head to the left     Numbness     Hands and Feet    Sleep apnea     Sleep disorder     Tingling     Hands and Feet    Weakness      Past Surgical History:   Procedure Laterality Date    ACHILLES TENDON REPAIR  01/31/2002    left     APPENDECTOMY      HERNIA REPAIR  02/01/1951    INCISION AND DRAINAGE POSTERIOR LUMBAR SPINE N/A 10/30/2021    Procedure: possible lumbar drain;  Surgeon: Tempie Hoist, MD;  Location: [facility] OR;  Service: Neurosurgery;  Laterality: N/A;    IRRIGATION AND DEBRIDEMENT SPINE N/A 10/30/2021    Procedure: CSF leak repair, exploration of the neck;  Surgeon: Tempie Hoist, MD;  Location: [facility] OR;  Service: Neurosurgery;  Laterality: N/A;    KNEE SURGERY Left 1976    SPINE SURGERY      TONSILLECTOMY      WISDOM TOOTH EXTRACTION         SOH  Social History     Socioeconomic History    Marital status: Married     Spouse name: Not on file    Number of children: Not on file    Years of education: Not on file    Highest education level: Not on file   Occupational History    Not on file   Tobacco Use    Smoking  status: Never    Smokeless tobacco: Never   Vaping Use    Vaping Use: Never used   Substance and Sexual Activity    Alcohol use: Not Currently     Alcohol/week: 4.0 standard drinks of alcohol     Types: 2 Glasses of wine, 2 Cans of beer per week    Drug use: No     Comment: 02/16/10    Sexual activity: Not on file   Other Topics Concern    Caffeine Use Yes    Occupational Exposure Not Asked    Exercise Yes    Seat Belt Yes   Social History Narrative    Not on file     Social Determinants of Health     Financial Resource Strain: Not on file   Food Insecurity: No Food Insecurity (10/13/2021)    Yearly Questionnaire     Do you need any assistance with obtaining housing, meals, medication,  transportation or medical equipment?: No     Assistance needed for:: Not on file   Transportation Needs: No Transportation Needs (10/13/2021)    Yearly Questionnaire     Do you need any assistance with obtaining housing, meals, medication, transportation or medical equipment?: No     Assistance needed for:: Not on file   Physical Activity: Not on file   Stress: Not on file   Social Connections: Not on file   Intimate Partner Violence: Not At Risk (12/30/2021)    Humiliation, Afraid, Rape, and Kick questionnaire     Fear of Current or Ex-Partner: No     Emotionally Abused: No     Physically Abused: No     Sexually Abused: No   Housing Stability: Low Risk  (10/13/2021)    Yearly Questionnaire     Do you need any assistance with obtaining housing, meals, medication, transportation or medical equipment?: No     Assistance needed for:: Not on file       [facility]  Family History   Problem Relation Age of Onset    Colon Cancer Mother     Heart failure Father        MEDS  Prior to Admission medications    Medication Sig Start Date End Date Taking? Authorizing Provider   acetaminophen (TYLENOL) 325 MG tablet Take 2 tablets (650 mg total) by mouth every 6 hours. 11/09/21  Yes Derrill Center, CNP   albuterol 90 mcg/actuation Inhl inhaler Inhale 2 puffs into  the lungs every 6 hours as needed for Wheezing or Shortness of Breath.   Yes Historical Provider, MD   amLODIPine (NORVASC) 5 MG tablet Take 1 tablet (5 mg total) by mouth daily.   Yes Historical Provider, MD   aspirin 81 MG EC tablet Take 1 tablet (81 mg total) by mouth daily.   Yes Historical Provider, MD   azelastine (ASTELIN) 137 mcg (0.1 %) nasal spray Use 2 sprays into each nostril 2 times a day. 10/01/20  Yes Lorretta Harp, MD   bimatoprost (LUMIGAN) 0.01 % Drop Place 1 drop into both eyes daily. 06/13/11  Yes Historical Provider, MD   brimonidine (ALPHAGAN) 0.2 % ophthalmic solution Place 1 drop into the right eye 2 times a day.   Yes Historical Provider, MD   cetirizine (ZYRTEC) 10 MG tablet Take 1 tablet (10 mg total) by mouth daily. 10/01/20  Yes Lorretta Harp, MD   doxepin (SINEQUAN) 10 MG capsule Take 1 capsule (10 mg total) by mouth at bedtime as needed.   Yes Historical Provider, MD   DULoxetine (CYMBALTA) 60 MG capsule Take 1 capsule (60 mg total) by mouth daily.   Yes Historical Provider, MD   finasteride (PROSCAR) 5 mg tablet Take 0.5 tablets (2.5 mg total) by mouth daily. 04/24/11  Yes Historical Provider, MD   fluticasone propionate (FLONASE) 50 mcg/actuation nasal spray Use 1 spray into each nostril daily.   Yes Historical Provider, MD   melatonin 3 mg Tab Take 3 tablets (9 mg total) by mouth at bedtime. 11/09/21  Yes Derrill Center, CNP   methocarbamoL (ROBAXIN) 500 MG tablet Take 1 tablet (500 mg total) by mouth 3 times a day as needed. 11/09/21  Yes Derrill Center, CNP   oxyCODONE (OXAYDO) 5 mg TbOr Take 1 tablet (5 mg total) by mouth every 6 hours as needed for Pain. **DO NOT CRUSH OR HALVE**   Yes Historical Provider, MD   pregabalin (LYRICA) 150 MG capsule Take 1 capsule (150 mg  total) by mouth. 1 tablet in the AM and 2 tablets in the evening.   Yes Historical Provider, MD   rosuvastatin (CRESTOR) 10 MG tablet Take 1 tablet (10 mg total) by mouth every evening. 10/06/21 04/04/22 Yes Historical Provider,  MD   senna-docusate (SENNA-S) 8.6-50 mg per tablet Take 1 tablet by mouth 2 times a day. 10/21/21  Yes Vanita Panda, CNP   tadalafiL (CIALIS) 20 MG tablet Take 1 tablet (20 mg total) by mouth daily as needed for Erectile Dysfunction. 08/24/21  Yes Historical Provider, MD   timolol (TIMOPTIC) 0.5 % ophthalmic solution Place 1 drop into both eyes every 12 hours.   Yes Historical Provider, MD   ALPHA LIPOIC ACID ORAL Take 1 capsule by mouth daily.    Historical Provider, MD   baclofen (LIORESAL) 10 MG tablet Take 1 tablet (10 mg total) by mouth daily as needed (Muscle spasms).    Historical Provider, MD   gabapentin (NEURONTIN) 100 MG capsule Take 2 capsules (200 mg total) by mouth 3 times a day. 11/09/21   Derrill Center, CNP   naloxone [facility]) 4 mg/actuation Spry Apply 1 spray in one nostril if needed. Call 911. May repeat dose in other nostril if no response in 3 minutes. 10/21/21   Vanita Panda, CNP   QUEtiapine (SEROQUEL) 25 MG tablet Take 1 tablet (25 mg total) by mouth at bedtime. 11/09/21   Derrill Center, CNP       ALL  Allergies   Allergen Reactions    Animal Dander      Causes congestion    Adhesive Tape-Silicones Rash       OBJECTIVE:  No intake or output data in the 24 hours ending 12/30/21 1345  Temp:  [97.2 F (36.2 C)] 97.2 F (36.2 C)  Heart Rate:  [58] 58  Resp:  [16] 16  BP: (165)/(88) 165/88  Patient Vitals for the past 24 hrs:   BP Temp Temp src Pulse Resp SpO2 Height Weight   12/30/21 1232 165/88 97.2 F (36.2 C) Temporal 58 16 98 % 5' 11 (1.803 m) 169 lb (76.7 kg)       Exam  *GEN:   Alert, appropriate, NAD, wife at bedside  *HEENT:  NC/AT  *Neck:  Well healed anterior neck incision  *C/V:  extremities warm and well perfused  *CHEST:  Breathing unlabored    *NEURO:  -MS:  AA&Ox3, GCS 15  -CN:   EOMI, face symmetric, mild dysarthria.   -Motor:   FCCx4, full and symmetric strength and ROM  -Reflexes: + Hoffman sign, b/l patellar 3+ hyperreflexia.   -Sensation: SILT x4    Labs  Lab  Results   Component Value Date    GLUCOSE 107 (H) 12/14/2021    BUN 25 12/14/2021    CO2 32 12/14/2021    CREATININE 0.86 12/14/2021    K 4.3 12/14/2021    NA 141 12/14/2021    CL 102 12/14/2021    CALCIUM 9.2 12/14/2021    MG 2.2 11/10/2021    PHOS 3.0 11/10/2021     Lab Results   Component Value Date    ALT 27 12/14/2021    AST 22 12/14/2021    ALKPHOS 61 12/14/2021    BILITOT 0.4 12/14/2021     Lab Results   Component Value Date    WBC 6.5 12/14/2021    HGB 13.1 (L) 12/14/2021    HCT 38.2 (L) 12/14/2021    MCV 96.3 12/14/2021  PLT 177 12/14/2021     Lab Results   Component Value Date    INR 1.0 12/14/2021         No results found for: LIPIDCOMM, CHOLTOT, TRIG, HDL, CHOLHDL, LDL  No results found for: PHART, PCO2, PO2ART, HCO3ART, BEART, HBO2PER, O2SATART  Lab Results   Component Value Date    ABS Negative 12/14/2021    ABOGROUP O 12/14/2021    RH Positive 12/14/2021           Invalid input(s): PLTFUNASP    Imaging Review  MRI which showed continued moderate to severe cervical stenosis with 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 He does report improved eating and his diet was recently advanced by SLP.     PLAN:  - OR for C3-5 posterior cervical decompression and fusion  - plan to admit to the floor post-op    Gerrit Heckaryn Izzabelle Bouley MD  Neurosurgery Resident  1:45 PM 12/30/2021

## 2021-12-30 NOTE — Unmapped (Signed)
[facility] OPERATIVE REPORT    CONFIDENTIAL - DO NOT COPY WITHOUT APPROPRIATE AUTHORIZATION    Patient Name: Matthew [name]    MRN: [id number]    Surgery Date: 12/30/2021    Surgeon(s): Denyse Dago, MD    Assistant(s):     Resident(s): Matthew Virginia Rochester, MD    PREOPERATIVE DIAGNOSES:  1. Cervical spondylotic myelopathy.  2. Cervical stenosis.    POSTOPERATIVE DIAGNOSES:  1. Cervical spondylotic myelopathy.  2. Cervical stenosis.    OPERATION:  1. C3-C5 posterior cervical decompressive laminectomies and foraminotomies.  2. C3-C5 posterior cervical onlay fusion.  3. Local bone graft harvest.    Anesthesia:  GETA.    Complications:  None.    OPERATIVE REPORT:  The patient is a 76 y.o. 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 He presents with continued loss of balance and pain in left neck & shoulder.  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  He reports improved shoulder strength in his shoulder but continued loss of balance.  He does report improved eating and slowly improvement of his dysphagia.  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  Work up noted Matthew [name] as a patient with cervical stenosis and spondylotic myelopathy. Operative and non-operative options were reviewed with operative recommendations consisting of C3-C5 posterior cerivcal laminectomies and foraminotomies and fusion.  Details of the surgical procedure were discussed at length, with risks and benefits noted.  Risks include, but are not limited to, bleeding, infection, cerebrospinal fluid leak, failure of  the instrumentation and fusion construct, further neurological injury including paralysis, bowel and bladder dysfunction, coma and death.  As well, he was advised that surgery may fail to relieve his symptoms, and could potentially make them worse, and that it is unlikely that surgery would render him pain or neurological symptom free.  He verbalized understanding of the risks and benefits, and wished to proceed with the surgery.    OPERATIVE REPORT: The patient was brought into the operative suite and placed under general anesthesia without difficulty. Antibiotics and mechanical DVT prophylaxis was initiated.  He was subsequently placed into a three- point Mayfield fixation and transferred to the operative table onto two large chest rolls in a prone position with all appropriate pressure points padded. Neutral position was then held of his cervical spine, which was secured to the operative table using the Mayfield head holder fixation. A localizing x-ray was then obtained to confirm anatomic alignment along with placement of the skin incision. The region was then subsequently devoid of hair and prepped and draped in a sterile fashion.  Local anesthetic was then infiltrated into the incision line.    OPENING OF THE WOUND:  A time out procedure was then completed.  A #20 blade was then used to make a linear skin incision with hemostasis achieved with bipolar electrocautery. A self- retaining retractor was then subsequently placed into the field with a monopolar electrocautery used to dissect down the midline raphe. A subperiosteal dissection was then performed bilaterally with exposure of the lateral masses and subsequent advancement of the retractors. A localizing x-ray was obtained again to confirm anatomic level with  a marker placed intraoperatively for localization.    C3-C5 DECOMPRESSIVE CERVICAL LAMINECTOMIES AND FORAMINOTOMIES:  The large Leksell rongeur was then used to remove the spinous processes of C3, C4,  and C5 levels. The drill with the cutting bur was then used, along with rongeurs to complete the C3, C4, and C5 laminectomies to the medial facet lines bilaterally. The Kerrison rongeurs were then used to complete the C3-C5 medial facectectomies and foraminotomies.  Once this was performed, the wound was then copiously irrigated and decompression verified with intraoperative ultrasound.      C3-C5 POSTERIOR CERVICAL ONLAY FUSION:  Decortication of the lateral mass and facet joints was performed bilaterally using the drill using the cutting bur at the C3, C4, and C5 levels bilaterally. The local bone graft was then cleaned of soft tissue and morsellized, then divided into two portions. This was then placed over the decorticated surfaces of C3, C4, and C5 bilaterally and digitally impacted. Final lateral x-rays were then obtained, and valsalva revealed no CSF leak.  The dura was then re-explored with no evidence of compression and a pledget of Gelfoam placed over this for protection.    CLOSURE OF THE WOUND:  A drain was tunneled out of the wound and cut to length. This was then connected to a Hemovac suction. The muscle and fascial layer were then reapproximated with 0 Vicryls followed by 0 Vicryls for the fatty layer. 2-0 Vicryl was then used to approximate the dermal region followed by skin closure.  Sterile dressings were then subsequently applied.  The patient was then removed from the Mayfield headholder fixation and subsequently transferred back to a hospital bed using cervical spine precautions without difficulty.    All needle and sponge counts were correct and there were no complications. The patient tolerated the procedure well and there were no adverse reactions as reported by the anesthesiologist.  Electrophysiological monitoring potentials remained unchanged throughout the entirety of the procedure.  Attending attestation statement of Dr. Tyrell Antonio: I was scrubbed and present for all critical portions  of the procedure.  Additional coverage was provided by Dr. Tammy Sours.    ______________________, M.D.  Denyse Dago, M.D.

## 2021-12-30 NOTE — Unmapped (Signed)
Anesthesia Post Note    Patient: Matthew MacleodRobert Machi    Procedure(s) Performed: Procedure(s):  C3-C5 POSTERIOR CERVICAL LAMINECTOMY AND FUSION    Anesthesia type: general endotracheal    Patient location: PACU    Airway: Patent    Post pain: Adequate analgesia    Nausea / Vomiting: Absent    Post-operative Hydration Status: Adequate    Post assessment: no apparent anesthetic complications, tolerated procedure well, and no evidence of recall    Last Vitals:   Vitals:    12/30/21 1830 12/30/21 1845 12/30/21 1900 12/30/21 1915   BP: (!) 87/63 94/64 (!) 89/60 (!) 89/61   BP Location:       Patient Position:       BP Cuff Size:       Pulse: 63 60 63 59   Resp: 16 9 12 12    Temp:       TempSrc:       SpO2: 96% 96% 94% 95%   Weight:       Height:            Post vital signs: stable    Level of consciousness: awake, alert , and oriented    Complications:  No notable events documented.

## 2021-12-30 NOTE — Unmapped (Signed)
[facility]  DEPARTMENT OF ANESTHESIOLOGY  PRE-PROCEDURAL EVALUATION    Matthew [name] is a 76 y.o. 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       No history of anesthetic complications   I have reviewed the History and Physical Exam, any relevant changes are noted in the anesthesia pre-operative evaluation.      Cardiovascular:    Exercise tolerance: good  Duke Met score: 5 - Walking four miles per hour. Social dancing. Washing a car.  (+) hyperlipidemia (on crestor).    Hypertension (takes amlodipine) is well controlled.      (-) dysrhythmias, angina, CHF.    Neuro/Muscoloskeletal/Psych:    (+) neuromuscular disease (esophageal dysphagia, cervical myelopathy).      (-) seizures, TIA, back problems, no anxiety, no depression.     Pulmonary:          Sleep apnea (does not use CPAP).    (-) no pneumonia, asthma, shortness of breath, recent URI.       GI/Hepatic/Renal:            (-) GERD, liver disease, renal disease.    Endo/Other:          (-) diabetes mellitus, hypothyroidism, no anemia, no DVT, no steroid use.     Comments:   CT coronary calcium scoring 09/03/2021  IMPRESSION:   1. Total calcium score of 483, implying extensive atherosclerotic plaque. There is a high likelihood of at least one significant coronary narrowing.      MRI c-spine 07/14/2021  IMPRESSION:   1. 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 Severe left C4 foraminal stenosis.   2.  Degenerative changes other levels without additional cord compression. Mild to moderate foraminal narrowing as described.    No opiate use        Past Medical History     Past Medical  History:   Diagnosis Date    Abnormal gait     Cancer (CMS-HCC)     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    Environmental allergies     Hearing loss     left (since age 71), right more recent    High cholesterol     Hypertension     Neck symptom     Trouble turning head to the left     Numbness     Hands and Feet    Sleep apnea     Sleep disorder     Tingling     Hands and Feet    Weakness        Past Surgical History     Past Surgical History:   Procedure Laterality Date    ACHILLES TENDON REPAIR  01/31/2002    left     APPENDECTOMY      HERNIA REPAIR  02/01/1951    INCISION AND DRAINAGE POSTERIOR LUMBAR SPINE N/A 10/30/2021    Procedure: possible lumbar drain;  Surgeon: Tempie Hoist, MD;  Location: [facility] OR;  Service: Neurosurgery;  Laterality: N/A;    IRRIGATION AND DEBRIDEMENT SPINE N/A 10/30/2021    Procedure: CSF leak repair, exploration of  the neck;  Surgeon: Tempie HoistBenjamin Motley, MD;  Location: [facility] OR;  Service: Neurosurgery;  Laterality: N/A;    KNEE SURGERY Left 1976    SPINE SURGERY      TONSILLECTOMY      WISDOM TOOTH EXTRACTION         Family History     Family History   Problem Relation Age of Onset    Colon Cancer Mother     Heart failure Father        Social History     Social History     Socioeconomic History    Marital status: Married     Spouse name: Not on file    Number of children: Not on file    Years of education: Not on file    Highest education level: Not on file   Occupational History    Not on file   Tobacco Use    Smoking status: Never    Smokeless tobacco: Never   Vaping Use    Vaping Use: Never used   Substance and Sexual Activity    Alcohol use: Not Currently     Alcohol/week: 4.0 standard drinks of alcohol     Types: 2 Glasses of wine, 2 Cans of beer per week    Drug use: No     Comment: 02/16/10    Sexual activity: Not on file   Other Topics Concern    Caffeine Use Yes    Occupational Exposure Not Asked    Exercise Yes    Seat Belt Yes   Social History Narrative    Not on file     Social Determinants of Health      Financial Resource Strain: Not on file   Food Insecurity: No Food Insecurity (10/13/2021)    Yearly Questionnaire     Do you need any assistance with obtaining housing, meals, medication, transportation or medical equipment?: No     Assistance needed for:: Not on file   Transportation Needs: No Transportation Needs (10/13/2021)    Yearly Questionnaire     Do you need any assistance with obtaining housing, meals, medication, transportation or medical equipment?: No     Assistance needed for:: Not on file   Physical Activity: Not on file   Stress: Not on file   Social Connections: Not on file   Intimate Partner Violence: Not on file   Housing Stability: Low Risk  (10/13/2021)    Yearly Questionnaire     Do you need any assistance with obtaining housing, meals, medication, transportation or medical equipment?: No     Assistance needed for:: Not on file       Medications     Allergies:  Allergies   Allergen Reactions    Animal Dander      Causes congestion    Adhesive Tape-Silicones Rash       Home Meds:  Prior to Admission medications as of 12/10/21 1538   Medication Sig Taking?   acetaminophen (TYLENOL) 325 MG tablet Take 2 tablets (650 mg total) by mouth every 6 hours. Yes   albuterol 90 mcg/actuation Inhl inhaler Inhale 2 puffs into the lungs every 6 hours as needed for Wheezing or Shortness of Breath. Yes   amLODIPine (NORVASC) 5 MG tablet Take 1 tablet (5 mg total) by mouth daily. Yes   aspirin 81 MG EC tablet Take 1 tablet (81 mg total) by mouth daily. Yes   azelastine (ASTELIN) 137 mcg (0.1 %) nasal spray Use 2 sprays into  each nostril 2 times a day. Yes   bimatoprost (LUMIGAN) 0.01 % Drop Place 1 drop into both eyes daily. Yes   brimonidine (ALPHAGAN) 0.2 % ophthalmic solution Place 1 drop into the right eye 2 times a day. Yes   cetirizine (ZYRTEC) 10 MG tablet Take 1 tablet (10 mg total) by mouth daily. Yes   doxepin (SINEQUAN) 10 MG capsule Take 1 capsule (10 mg total) by mouth at bedtime as needed. Yes    DULoxetine (CYMBALTA) 60 MG capsule Take 1 capsule (60 mg total) by mouth daily. Yes   finasteride (PROSCAR) 5 mg tablet Take 0.5 tablets (2.5 mg total) by mouth daily. Yes   fluticasone propionate (FLONASE) 50 mcg/actuation nasal spray Use 1 spray into each nostril daily. Yes   melatonin 3 mg Tab Take 3 tablets (9 mg total) by mouth at bedtime. Yes   methocarbamoL (ROBAXIN) 500 MG tablet Take 1 tablet (500 mg total) by mouth 3 times a day as needed. Yes   oxyCODONE (OXAYDO) 5 mg TbOr Take 1 tablet (5 mg total) by mouth every 6 hours as needed for Pain. **DO NOT CRUSH OR HALVE** Yes   pregabalin (LYRICA) 150 MG capsule Take 1 capsule (150 mg total) by mouth. 1 tablet in the AM and 2 tablets in the evening. Yes   rosuvastatin (CRESTOR) 10 MG tablet Take 1 tablet (10 mg total) by mouth every evening. Yes   senna-docusate (SENNA-S) 8.6-50 mg per tablet Take 1 tablet by mouth 2 times a day. Yes   tadalafiL (CIALIS) 20 MG tablet Take 1 tablet (20 mg total) by mouth daily as needed for Erectile Dysfunction. Yes   timolol (TIMOPTIC) 0.5 % ophthalmic solution Place 1 drop into both eyes every 12 hours. Yes   ALPHA LIPOIC ACID ORAL Take 1 capsule by mouth daily.    baclofen (LIORESAL) 10 MG tablet Take 1 tablet (10 mg total) by mouth daily as needed (Muscle spasms).    gabapentin (NEURONTIN) 100 MG capsule Take 2 capsules (200 mg total) by mouth 3 times a day.    naloxone (NARCAN) 4 mg/actuation Spry Apply 1 spray in one nostril if needed. Call 911. May repeat dose in other nostril if no response in 3 minutes.    QUEtiapine (SEROQUEL) 25 MG tablet Take 1 tablet (25 mg total) by mouth at bedtime.        Inpatient Meds:  Scheduled:     Continuous:    lactated Ringers         PRN: acetaminophen, ceFAZolin (ANCEF) in D5W 50 mL, dextrose 10% in water **OR** dextrose 10% in water, glucose, lidocaine (PF) 2% (20 mg/mL), vancomycin (VANCOCIN) 20 mg/kg = 1,500 mg in sodium chloride 0.9 % 250 mL IVPB    Vital Signs     Wt Readings  from Last 3 Encounters:   12/30/21 169 lb (76.7 kg)   12/08/21 169 lb (76.7 kg)   12/01/21 162 lb (73.5 kg)     Ht Readings from Last 3 Encounters:   12/30/21 5' 11 (1.803 m)   12/08/21 5' 11 (1.803 m)   12/01/21 5' 11 (1.803 m)     Temp Readings from Last 3 Encounters:   12/30/21 97.2 F (36.2 C) (Temporal)   12/08/21 96.1 F (35.6 C) (Oral)   12/01/21 96 F (35.6 C)     BP Readings from Last 3 Encounters:   12/30/21 165/88   12/08/21 138/82   12/01/21 120/78     Pulse Readings from Last  3 Encounters:   12/30/21 58   12/08/21 54   12/01/21 64     @LASTSAO2 (3)@    Physical Exam     Airway:     Mallampati: II  TM distance: > = 3 FB  Neck ROM: limited  (-) no facial hair, neck not short, not intubated      Dental:   - No obvious cracked, loose, chipped, or missing teeth.     Pulmonary:      Breathing: unlabored       (-) no rhonchi and no rales.    Cardiovascular:     Rhythm: regular  Rate: normal  (-) murmur and peripheral edema.    Neuro/Musculoskeletal/Psych:     Mental status: alert and oriented to person, place and time.          Abdominal:     Not obese.    Current OB Status:       Other Findings:        Laboratory Data     Lab Results   Component Value Date    WBC 6.5 12/14/2021    HGB 13.1 (L) 12/14/2021    HCT 38.2 (L) 12/14/2021    MCV 96.3 12/14/2021    PLT 177 12/14/2021       No results found for: [facility]    Lab Results   Component Value Date    GLUCOSE 107 (H) 12/14/2021    BUN 25 12/14/2021    CO2 32 12/14/2021    CREATININE 0.86 12/14/2021    K 4.3 12/14/2021    NA 141 12/14/2021    CL 102 12/14/2021    CALCIUM 9.2 12/14/2021    ALBUMIN 3.8 12/14/2021    PROT 6.8 12/14/2021    ALKPHOS 61 12/14/2021    ALT 27 12/14/2021    AST 22 12/14/2021    BILITOT 0.4 12/14/2021       Lab Results   Component Value Date    INR 1.0 12/14/2021       No results found for: PREGTESTUR, PREGSERUM, HCG, HCGQUANT    Anesthesia Plan     ASA 2         Current non-smoker    Anesthesia Type:  general endotracheal.       PONV Risk Factors: current non-smoker,  plan for postoperative opioid use.              Induction:   Intravenous induction.    (Video laryngoscope)  Anesthetic plan and risks discussed with patient.    Plan, alternatives, and risks of anesthesia, including death, have been explained to and discussed with the patient/legal guardian.  By my assessment, the patient/legal guardian understands and agrees.  Scenario presented in detail.  Questions answered.    Use of blood products discussed with patient who consented to blood products.        Plan discussed with CRNA and RNSA.

## 2021-12-30 NOTE — Unmapped (Signed)
[facility]  DEPARTMENT OF NEUROSURGERY  POSTOPERATIVE EVALUATION  Operation/Procedure(s) Performed:   (1) C3-5 posterior cervical decompression and onlay fusion    Subjective:  Tolerated surgery well. Extubated and in PACU.     Objective   Somnolent but awakens on follows commands x4 with grossly full strength.      Post Operative Imaging   Xrays when able    Assessment   76 y.o. 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 He is now POD0 s/p C3-5 posterior cervical decompression and onlay fusion    Plan  - floor status  - Q4h neuro checks  - 1 drains so suction  - Miami J brace for comfort  - UR xrays when able  - tylenol, oxy, robaxin for pain control. PRN dilaudid  - SCD, SQH for DVT proph  - PT/OT, activity as tolerated.     Gerrit Heckaryn Minoru Chap, MD  Neurosurgery Resident (Pager [phone])  5:44 PM 12/30/2021

## 2021-12-30 NOTE — Unmapped (Signed)
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 (Active)   Number of days: 0                There were no complications unless listed below.         Matthew [name] Matthew [name]     Date: 12/30/2021  Time: 5:09 PM

## 2021-12-30 NOTE — Unmapped (Signed)
INTRA-OP POST BRIEFING NOTE: Matthew [name]      Specimens:     Prior to leaving the room: Nurse confirmed name of procedure, completion of instrument, sponge & needle counts, reads specimen labels aloud including patient name and addresses any equipment issues? Nurse confirmed wound class. Nurse to surgeon and anesthesia: What are key concerns for recovery and management of the patient?  Yes      Blood products stored at appropriate temperatures prior to return to blood bank (if applicable)? N/A      Patient identification band secured on patient prior to transfer out of the operating room? Yes    Temporary devices implanted for the duration of the surgery removed and evaluated for intactness and completeness prior to closure? N/A      Other Comments:     Signed: Oletta Lamas    Date: 12/30/2021    Time: 4:52 PM

## 2021-12-30 NOTE — Unmapped (Signed)
Insert Arterial Line    Date/Time: 12/30/2021 3:07 PM    Performed by: Armando ReichertMichael Tanieka Pownall, CRNA  Authorized by: Armando ReichertMichael Aide Wojnar, CRNA    Consent:     Consent obtained:  Verbal    Consent given by:  Patient    Risks, benefits, and alternatives were discussed: yes      Risks discussed:  Bleeding, ischemia, repeat procedure, infection and pain  Universal protocol:     Procedure explained and questions answered to patient or proxy's satisfaction: yes      Relevant documents present and verified: yes      Test results available: yes      Imaging studies available: yes      Required blood products, implants, devices, and special equipment available: yes      Site/side marked: yes      Immediately prior to procedure, a time out was called: yes      Patient identity confirmed:  Hospital-assigned identification number and arm band  Indications:     Indications: hemodynamic monitoring    Pre-procedure details:     Skin preparation:  Chlorhexidine  Sedation:     Sedation type:  None  Anesthesia:     Anesthesia method:  None  Procedure details:     Location:  L radial    Needle gauge:  20 G    Placement technique:  Seldinger    Number of attempts:  2    Transducer: waveform confirmed    Post-procedure details:     Post-procedure:  Secured with tape and sterile dressing applied    CMS:  Normal and unchanged    Procedure completion:  Tolerated well, no immediate complications

## 2021-12-31 ENCOUNTER — Inpatient Hospital Stay: Admit: 2021-12-31 | Payer: MEDICARE

## 2021-12-31 LAB — CBC
Hematocrit: 32.5 % — ABNORMAL LOW (ref 38.5–50.0)
Hemoglobin: 11.3 g/dL — ABNORMAL LOW (ref 13.2–17.1)
MCH: 32.9 pg (ref 27.0–33.0)
MCHC: 34.6 g/dL (ref 32.0–36.0)
MCV: 95 fL (ref 80.0–100.0)
MPV: 8.8 fL (ref 7.5–11.5)
Platelets: 119 10E3/uL — ABNORMAL LOW (ref 140–400)
RBC: 3.43 10E6/uL — ABNORMAL LOW (ref 4.20–5.80)
RDW: 14.4 % (ref 11.0–15.0)
WBC: 7.3 10E3/uL (ref 3.8–10.8)

## 2021-12-31 LAB — BASIC METABOLIC PANEL
Anion Gap: 7 mmol/L (ref 3–16)
BUN: 20 mg/dL (ref 7–25)
CO2: 30 mmol/L (ref 21–33)
Calcium: 8.3 mg/dL — ABNORMAL LOW (ref 8.6–10.3)
Chloride: 103 mmol/L (ref 98–110)
Creatinine: 0.7 mg/dL (ref 0.60–1.30)
EGFR: >90
Glucose: 131 mg/dL — ABNORMAL HIGH (ref 70–100)
Osmolality, Calculated: 294 mosm/kg (ref 278–305)
Potassium: 3.9 mmol/L (ref 3.5–5.3)
Sodium: 140 mmol/L (ref 133–146)

## 2021-12-31 LAB — PHOSPHORUS: Phosphorus: 3.3 mg/dL (ref 2.1–4.5)

## 2021-12-31 LAB — MAGNESIUM: Magnesium: 2 mg/dL (ref 1.5–2.5)

## 2021-12-31 MED ORDER — senna-docusate (SENNA-S) 8.6-50 mg per tablet
8.6-50 | ORAL_TABLET | Freq: Every evening | ORAL | 0 refills | 30.00000 days | Status: AC
Start: 2021-12-31 — End: 2022-01-21

## 2021-12-31 MED ORDER — tiZANidine (ZANAFLEX) 2 MG tablet
2 | ORAL_TABLET | Freq: Four times a day (QID) | ORAL | 0 refills | PRN
Start: 2021-12-31 — End: 2021-12-31

## 2021-12-31 MED ORDER — oxyCODONE (ROXICODONE) 5 MG immediate release tablet
5 | ORAL_TABLET | ORAL | 0 refills | PRN
Start: 2021-12-31 — End: 2021-12-31

## 2021-12-31 MED ORDER — senna-docusate (SENNA-S) 8.6-50 mg per tablet
8.6-50 | ORAL_TABLET | Freq: Two times a day (BID) | ORAL | 0 refills | Status: AC
Start: 2021-12-31 — End: ?

## 2021-12-31 MED ORDER — tiZANidine (ZANAFLEX) 2 MG tablet
2 | ORAL_TABLET | Freq: Four times a day (QID) | ORAL | 0 refills | 30.00000 days | Status: AC | PRN
Start: 2021-12-31 — End: 2022-01-19

## 2021-12-31 MED ORDER — naloxone (NARCAN) 4 mg/actuation Spry
4 | NASAL | 1 refills | Status: AC | PRN
Start: 2021-12-31 — End: ?

## 2021-12-31 MED ORDER — tiZANidine (ZANAFLEX) tablet 2 mg
4 | Freq: Four times a day (QID) | ORAL | PRN
Start: 2021-12-31 — End: 2021-12-31

## 2021-12-31 MED ORDER — lidocaine (LIDODERM) 5 %
5 | MEDICATED_PATCH | Freq: Every day | TOPICAL | 0 refills
Start: 2021-12-31 — End: 2021-12-31

## 2021-12-31 MED ORDER — oxyCODONE (ROXICODONE) 5 MG immediate release tablet
5 | ORAL_TABLET | ORAL | 0 refills | 6.00000 days | Status: AC | PRN
Start: 2021-12-31 — End: 2022-01-07

## 2021-12-31 MED ORDER — aspirin 81 MG EC tablet
81 | Freq: Every day | ORAL | 1.00 refills | 30.00000 days | Status: AC
Start: 2021-12-31 — End: ?

## 2021-12-31 MED ORDER — lidocaine (LIDODERM) 5 %
5 | MEDICATED_PATCH | Freq: Every day | TOPICAL | 0 refills | 30.00000 days | Status: AC
Start: 2021-12-31 — End: ?

## 2021-12-31 MED ORDER — naloxone (NARCAN) 4 mg/actuation Spry
4 | NASAL | 1 refills | PRN
Start: 2021-12-31 — End: 2021-12-31

## 2021-12-31 MED FILL — PHENYLEPHRINE 10 MG/ML INJECTION SOLUTION: 10 10 mg/mL | INTRAMUSCULAR | Qty: 0.99

## 2021-12-31 MED FILL — BRIMONIDINE 0.2 % EYE DROPS: 0.2 0.2 % | OPHTHALMIC | Qty: 5

## 2021-12-31 MED FILL — HEPARIN (PORCINE) 5,000 UNIT/ML INJECTION SOLUTION: 5000 5,000 unit/mL | INTRAMUSCULAR | Qty: 1

## 2021-12-31 MED FILL — ATORVASTATIN 40 MG TABLET: 40 40 MG | ORAL | Qty: 1

## 2021-12-31 MED FILL — FINASTERIDE 5 MG TABLET: 5 5 mg | ORAL | Qty: 1

## 2021-12-31 MED FILL — FLUTICASONE PROPIONATE 50 MCG/ACTUATION NASAL SPRAY,SUSPENSION: 50 50 mcg/actuation | NASAL | Qty: 16

## 2021-12-31 MED FILL — TYLENOL 325 MG TABLET: 325 325 mg | ORAL | Qty: 3

## 2021-12-31 MED FILL — DIPRIVAN 10 MG/ML INTRAVENOUS EMULSION: 10 10 mg/mL | INTRAVENOUS | Qty: 19

## 2021-12-31 MED FILL — PREGABALIN 300 MG CAPSULE: 300 300 MG | ORAL | Qty: 1

## 2021-12-31 MED FILL — DIPRIVAN 10 MG/ML INTRAVENOUS EMULSION: 10 10 mg/mL | INTRAVENOUS | Qty: 100

## 2021-12-31 MED FILL — TIMOLOL MALEATE 0.5 % EYE DROPS: 0.5 0.5 % | OPHTHALMIC | Qty: 5

## 2021-12-31 MED FILL — PHENYLEPHRINE 10 MG/ML INJECTION SOLUTION: 10 10 mg/mL | INTRAMUSCULAR | Qty: 0.01

## 2021-12-31 MED FILL — OXYCODONE 5 MG TABLET: 5 5 MG | ORAL | Qty: 2

## 2021-12-31 MED FILL — LATANOPROST 0.005 % EYE DROPS: 0.005 0.005 % | OPHTHALMIC | Qty: 2.5

## 2021-12-31 MED FILL — MELATONIN 3 MG TABLET: 3 3 mg | ORAL | Qty: 3

## 2021-12-31 MED FILL — SENNOSIDES 8.6 MG-DOCUSATE SODIUM 50 MG TABLET: 8.6-50 8.6-50 mg | ORAL | Qty: 1

## 2021-12-31 MED FILL — VANCOMYCIN 1.25 GRAM INTRAVENOUS SOLUTION: 1.25 1.25 gram | INTRAVENOUS | Qty: 1

## 2021-12-31 MED FILL — PREGABALIN 150 MG CAPSULE: 150 150 MG | ORAL | Qty: 1

## 2021-12-31 MED FILL — AMLODIPINE 5 MG TABLET: 5 5 MG | ORAL | Qty: 1

## 2021-12-31 MED FILL — PREGABALIN 75 MG CAPSULE: 75 75 MG | ORAL | Qty: 4

## 2021-12-31 MED FILL — STIMULANT LAXATIVE PLUS 8.6 MG-50 MG TABLET: 8.6-50 8.6-50 mg | ORAL | Qty: 1

## 2021-12-31 MED FILL — DEXAMETHASONE 4 MG TABLET: 4 4 MG | ORAL | Qty: 2

## 2021-12-31 MED FILL — CELEBREX 200 MG CAPSULE: 200 200 mg | ORAL | Qty: 1

## 2021-12-31 MED FILL — DIPRIVAN 10 MG/ML INTRAVENOUS EMULSION: 10 10 mg/mL | INTRAVENOUS | Qty: 20

## 2021-12-31 MED FILL — ROBAXIN 100 MG/ML INJECTION SOLUTION: 100 100 mg/mL | INTRAMUSCULAR | Qty: 10

## 2021-12-31 MED FILL — DULOXETINE 60 MG CAPSULE,DELAYED RELEASE: 60 60 MG | ORAL | Qty: 1

## 2021-12-31 MED FILL — DIPRIVAN 10 MG/ML INTRAVENOUS EMULSION: 10 10 mg/mL | INTRAVENOUS | Qty: 1

## 2021-12-31 MED FILL — CETIRIZINE 10 MG TABLET: 10 10 MG | ORAL | Qty: 1

## 2021-12-31 MED FILL — LIDODERM 5 % TOPICAL PATCH: 5 5 % | TOPICAL | Qty: 1

## 2021-12-31 NOTE — Unmapped (Signed)
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 Patient will benefit from continued skilled PT services to maximize functional mobility for return to PLOF, reduce risk of falls, and allow for safe d/c home when medically appropriate.       Recommendation  Recommendation: Outpatient PT  Equipment Recommended: Patient already has needed DME         AM-PAC 6 Clicks Basic Mobility Inpatient Short Form: PT 6 Clicks Score: 20     Mobility Recommendations for Staff  Patient ability: Patient ambulates in room/ to bathroom, Patient ambulates in hallway  Assist needed: with 1 person assist  Equipment/ Precautions needed: Requires assistive device, Requires brace  Requires Assistive Device: Single-point cane  Requires brace: Miami J (for comfort only)    Home Living/Prior Function  Patient able to provide accurate information at this time: Yes  Lives With: Spouse  Assistance available: 24 hour assistance  Type of Home: Apartment  Home Entry: No steps to enter  Home Layout: One level  Bathroom  Shower/Tub: Event organiser: Shower chair;Grab bars in shower  Home Equipment: Rolling Walker;Single-point cane  Prior Function  Functional Mobility: Modified Independent  Uses Assistive Device: Single-point cane;Rolling walker (None in apartment; [facility] around Apt complex; RW in community)  Receives Help From: Family  ADL Assistance: Independent  IADL Assistance: Needs assistance  Vocation: Part time employment Psychiatric nurse)  Leisure Activities: tennis  Therapy services currently receiving: None     Pain  Pain Score: 0 - No Pain    Vision  Vision/Perception  Overall Vision/ Perception: Within Functional Limits       Cognition  Overall Cognitive Status: Within Functional Limits  Cognitive Assessment: Arousal/ Alertness;Orientation Level;Behavior;Following Commands;Insight;Safety Judgment  Arousal/Alertness: Alert  Orientation Level: Oriented X4  Behavior: Appropriate;Cooperative  Following Commands: Follows multistep commands;Requires verbal cues  Safety Judgment: Decreased awareness of need for assistance  Insight: Demonstrated decreased insight into limitations and abilities to complete ADLs safely    Neuromuscular  Overall Sensation: Impaired  Impairments: Light touch  Additional Comments: neuropathy B feet (baseline)          Upper Extremity  UE Assessment: Defer to OT evaluation for formal assessment    Lower Extremity  Lower Extremity  LE Assessment: Strength WFL (at least 3+/5) as observed during functional activity          Functional Mobility  Bed Mobility   Supine to Sit: Supervision;towards the right;head of bed flat  Sit to Supine:  (pt left sitting up in chair)  Transfers  Sit to Stand: Stand by assistance;increased time to complete task (from EOB)  Stand to Sit: Stand  by assistance (poor eccenrtic control; cues for sequence)  Gait  Distance (in feet): 90 ft w/o AD + 90 ft with SPC  Level of assistance: Stand By assistance;Contact Guard assistance  Assistive Device: None  Gait  Characteristics: Unsteady;No LOB;Increased trunk flexion;R decreased step length;L decreased step length (anterior lean; inconsistent foot clearance with quick cadece; cues for upright posture and heel toe pattern)  Balance  Sitting - Static: Independent  Sitting-Dynamic: Supervision   Standing-Static: Supervision  Standing-Dynamic: Stand by assistance;Contact Guard Assistance       Gait belt used: Yes    Outcome Measures       Position after Therapy/Safety Handoff  Position after treatment and safety handoff  Position after therapy session: Chair  Details: RN notified;visitor present;Call light/ needs within reach  Alarms: Chair  Alarms Status: Activated and Interfaced with call system    Goals  Collaborated with: Patient, Family  Patient Stated Goal: to go home  Goals to be met by: 01/07/22  Patient will transition from supine to sit: Independent  Patient will transfer from sit to stand: Modified Independent, up to assistive device  Patient will ambulate: Modified Independent, distance (in feet), with assistive device   Ambulation Asssistance Device: Least restrictive asistive device  Distance (in feet): 200 ft  Miscellaneous Goal #1: Pt will participate in standardized balance assessment  Long-term goal to be met by: 01/07/22  Long Term Goal : = STG     Patient/Family Education  Educated patient and patient's family on the role of physical therapy, plan of care, importance of increased activity, discharge recommendations, transfer training, gait training, brace information , no bending, lifting or twisting, and safety and need for supervision during OOB activity and fall prevention strategies, including need for supervision/ assistance with OOB activity and use of call light; patient and patient's family verbalized understanding and needed cues. Handout(s) issued: N/A.    Plan  Plan  Treatment/Interventions: Therapeutic Activity, Therapeutic Exercise, Gait training, Neuromuscular Reeducation, Equipment  eval/education  PT Frequency: minimum 2x/week    The plan of care and recommendations assesses the patient's and/or caregiver's readiness, willingness, and ability to provide or support functional mobility and ADL tasks as needed upon discharge.        Time  Start Time: 1007  Stop Time: 1030  Time Calculation (min): 23 min    Charges   $PT Evaluation Mod Complex 30 Min: 1 Procedure                    Problem List  Patient Active Problem List   Diagnosis    Spasmodic torticollis    Cerebellar ataxia (CMS-HCC)    Neuropathy    Chronic cough    Primary hypertension    HLD (hyperlipidemia)    Postoperative CSF leak    Oropharyngeal dysphagia    Cervical spinal cord injury (CMS-HCC)    CSF leak    Neuropathic pain    OSA (obstructive sleep apnea)    Pressure injury of deep tissue of dorsum of right foot    Pressure injury of deep tissue of toe of left foot    Pressure injury of left foot, unstageable (CMS-HCC)      Past Medical History  Past Medical History:   Diagnosis Date    Abnormal gait     Cancer (CMS-HCC)     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    Environmental allergies     Hearing loss     left (since age 76), right more  recent    High cholesterol     Hypertension     Neck symptom     Trouble turning head to the left     Numbness     Hands and Feet    Sleep apnea     Sleep disorder     Tingling     Hands and Feet    Weakness       Past Surgical History  Past Surgical History:   Procedure Laterality Date    ACHILLES TENDON REPAIR  01/31/2002    left     APPENDECTOMY      CERVICAL FUSION N/A 12/30/2021    Procedure: C3-C5 POSTERIOR CERVICAL LAMINECTOMY AND FUSION;  Surgeon: Denyse Dago, MD;  Location: [facility] OR;  Service: Neurosurgery;  Laterality: N/A;    HERNIA REPAIR  02/01/1951    INCISION AND DRAINAGE POSTERIOR LUMBAR SPINE N/A 10/30/2021    Procedure: possible lumbar drain;  Surgeon: Tempie Hoist, MD;  Location: [facility] OR;  Service: Neurosurgery;  Laterality: N/A;    IRRIGATION AND DEBRIDEMENT SPINE N/A 10/30/2021    Procedure:  CSF leak repair, exploration of the neck;  Surgeon: Tempie Hoist, MD;  Location: [facility] OR;  Service: Neurosurgery;  Laterality: N/A;    KNEE SURGERY Left 1976    SPINE SURGERY      TONSILLECTOMY      WISDOM TOOTH EXTRACTION

## 2021-12-31 NOTE — Unmapped (Signed)
[place]  Care Management/Social Work Assessment      Patient Information     Patient Name: Matthew [name]  MRN: [id number]  Hospital Day: 1  Inpatient/Observation: Inpatient  Admit Date: 12/30/2021  Admission Diagnosis: Cervical myelopathy (CMS-HCC) [G95.9]  Attending provider: Denyse Dago, MD    PCP: Demetrius Charity, MD  [facility] Pharm[facility]:              [phone] [place], [place] - [address]  [address] [facility]  [place] [address]  Phone: [phone]     [facility]  [address]  [place] [address]  Phone: [phone]          Issues related to obtaining medications: none reported    Payor Information   Medical Insurance Coverage:  Payor: MEDICARE / Plan: MEDICARE A AND B / Product Type: Medicare /   Secondary Payor: AARP    Functional Assessment   Functional Assessment  Assessment Information Obtained From:: Patient, Spouse  Current Mental Status: Awake, Oriented to Person, Oriented to Place, Oriented to Time, Oriented to Situation  Mental Status Prior to Admission: Unable to Assess  Mental Health History: No  Suicide Attempts: No  Activities of Daily Living: Partial Assistance Needed  Work History: Part-time  Job-Profession:: attorney  Marital Status: Married  Number of children and their names: 2 adult  Riki Rusk  Relative Search Completed: No  Demographics Correct:: Yes    Current Living Arrangements   Current Living Arrangements  Current Living Arrangements: Home  Type of Housing: Apartment  Who do you live with?: With Family  What family member?: spouse and adult son  One Story or Two (check all that apply): One Financial planner the number of steps and rails to enter the residence: none  Enter the number of steps and rails inside the residence: Doctor, general practice at Home: DME  DME Current: Paediatric nurse, Agricultural consultant, Medical laboratory scientific officer    Support Systems   Emergency contact: Extended  Emergency Contact Information  Primary Emergency Contact: Engineer, site)  Address: [address] [place], [address]           [place], [place] [address] [place]  Home Phone: [phone]  Relation: Spouse  Secondary Emergency Contact: [name]  Address: [address] [place], [place] [address] [place] Phone: [phone]  Relation: Daughter    Support Systems  Legal Status: HCPOA  Name of Guardian/POA/ Payee and Phone Number: Cyruss Arata is legal NOK.  [phone], copy not available  Primary Caregiver: Spouse  Times of available support: Total 24/7 hands on (add comment)  Marital Status: Married  Number of children and their names: 2 adult  Riki Rusk  Relative Search Completed: No  Demographics Correct:: Yes  Expected Discharge Disposition: Home  Next of Kin: [name] Kotter  Next of Kin Relationship: Spouse  Next of Kin Phone Number: [phone]  Assessment Information Obtained From:: Patient, Spouse    Other Pertinent Information     Spouse, Dhilan, Brauer is legal NOK. She reports that they have a HCPOA, CM requested copy of document , not available at this time.    Advance Directives (For Healthcare)  Advance Directive: Patient has advance directive, copy in chart  Healthcare Agent Appointed: No  Pre-existing DNR/DNI  Order: No  Patient Requests Assistance: Yes, will do independently    Discharge Plan     Met with patient to initiate discussion regarding discharge planning. Introduced self and role of case management/social work and provided Tour manager.   Patient lives with his spouse who can provide 24 hour S/A at discharge , he previously discharged to [facility] In October 10,2023, and discharged to home 10 days later. Patient and spouse report he has been doing out patient therapy at [facility] and wish to resume that at discharge. PTOT have cleared  for home with out patient PTOT.     Anticipated Discharge Plan: home with out patient PTOT     Anticipated Discharge Date: today    Anticipated Transportation: spouse    Patient/Family aware and taking part in the discharge plan.  Patient/family educated that once post-acute care needs have been identified, a provider list applicable to the identified post-acute care needs as well as the insurance provider will be provided, and patient/family have the freedom to choose their provider(s); financial interest(s) are disclosed as appropriate.  Elliot Dally RN CCM  [phone]

## 2021-12-31 NOTE — Unmapped (Signed)
Patient being discharged home with wife and belongings. Reviewed AVS with patient and wife, all questions answered at this time. [facility], patient and wife aware. Pt sent with imaging from hospital stay. PIV removed. Patient waiting on transport.

## 2021-12-31 NOTE — Unmapped (Signed)
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 Brallan was agreeable to occupational therapy assessment and tolerated well. Pt participated in donning pants with min A, functional mobility over household distance with SBA to CGA with and without cane, requiring cueing for safety awareness during task completion. Pt primarily limited by decreased activity tolerance, impaired balance, pain, decreased safety awareness, and impaired ADL participation. Patient will benefit from continued acute inpatient occupational therapy in order to promote return to prior level of function and maximize functional independence and safety. Continued occupational therapy in the outpatient setting at discharge is recommended.     Outcome Measures  AM-PAC 6 Clicks Daily Activity Inpatient Short Form: OT 6 Clicks Score: 20    Home Living/Prior Function  Patient able to provide accurate information at this  time: Yes  Lives With: Spouse  Assistance available: 24 hour assistance  Type of Home: Apartment  Home Entry: No steps to enter  Home Layout: One level  Bathroom Shower/Tub: Event organiser: Paediatric nurse, Grab bars in shower  Home Equipment: Agricultural consultant, Single-point cane    Prior Function  Functional Mobility: Modified Independent  Uses Assistive Device: Single-point cane, Rolling walker (None in apartment; [facility] around [employer] complex; RW in community)  Actor Help From: Family  ADL Assistance: Independent  IADL Assistance: Needs assistance  Vocation: Part time employment Psychiatric nurse)  Leisure Activities: tennis  Therapy services currently receiving: None    Pain  Pain Score: 0 - No Pain    Cognition  Overall Cognitive Status: Within Functional Limits  Cognitive Assessment: Arousal/ Alertness;Orientation Level;Behavior;Following Commands;Insight;Safety Judgment  Arousal/Alertness: Alert  Orientation Level: Oriented X4  Behavior: Appropriate;Cooperative  Following Commands: Follows multistep commands;Requires verbal cues  Safety Judgment: Decreased awareness of need for assistance  Insight: Demonstrated decreased insight into limitations and abilities to complete ADLs safely    Vision  Overall Vision/ Perception: Within Functional Limits       Right Upper Extremity   Right UE ROM: Grossly WFL as observed during functional activities  Right UE Strength: Grossly WFL (at least 3+/5) as observed during functional activities  Right UE Muscle Tone: Normal  Right Hand Function: Grossly WFL as observed during functional activity         Left Upper Extremity  Left UE ROM: Grossly WFL as observed during functional activities  Left UE Strength: Grossly WFL (at least 3+/5) as observed during functional  activities  Left UE Muscle Tone: Normal  Left UE Hand Function: Grossly WFL as observed during functional activites         Neuromuscular  Overall Sensation: Impaired  Impairments: Light touch  Additional Comments:  neuropathy B feet (baseline)          Functional Mobility  Bed Mobility   Supine to Sit: Supervision;head of bed flat;towards the right  Transfers  Sit to Stand: Stand by assistance  Stand to Sit: Stand by assistance  Functional Mobility: Stand by assistance;with assistive device;Contact guard assistance (pt ambulated household distance without AD and household distance with AD; pt with anterior lean during ambulation, requiring cueing to correct and to decrease rate of ambulation for safety)  Functional Mobility Assistive Device: Single-point cane  Balance  Sitting - Static: Independent  Sitting-Dynamic: Independent   Standing-Static: Stand by assistance  Standing-Dynamic: Stand by assistance;Contact Guard Assistance    Gait belt used: Yes    ADL  Lower Body Dressing: Minimal assistance (to don pants)  Lower Body Dressing Deficit: Verbal cueing;Supervision/safety (to pull pants up 2/2 grippy socks inhibiting movement; verbal cueing to problem solve ineffective task performance (threading both legs through same hole))  Location Assessed LE Dressing: Seated edge of bed         Position after Treatment/Safety Handoff  Position after therapy session: Recliner  Details: RN notified;visitor present;Call light/ needs within reach  Alarms: Chair  Alarms Status: Activated and Interfaced with call system    Plan  Plan  Treatment Interventions: ADL retraining, Activity Tolerance training, Patient/Family training, Excercise, Therapeutic Activity, Functional transfer training, IADL retraining, Compensatory technique education  OT Frequency: minimum 2x/week    The plan of care and recommendations assesses the patient's and/or caregiver's readiness, willingness, and ability to provide or support functional mobility and ADL tasks as needed upon discharge.    Goals   Goals to be met in: 1 week  Patient stated goal: to go home  Patient will complete supine to sit in prep for ADLs: Independent  Patient will complete functional chair  transfer: Supervision  Patient will complete toilet transfer: Will tolerate assessment  Patient will complete toileting: Will tolerate assessment  Patient will complete lower body dressing: Stand-by assistance  Miscellaneous Goal #1: Pt will tolerate formal cognitive assessment.  Long Term Goal : Pt will tolerate IADL assessment.  Long term goal to be met in: 2 weeks  Collaborated with: Patient    Patient/Family Education  Educated patient and patient's family on the role of occupational therapy, OT goals, OT plan of care, discharge recommendation, ADL training, functional mobility training, no bending, lifting or twisting, and the importance of safety and fall prevention strategies including need for supervision/ assistance with OOB activity and use of call light. patient and patient's family  verbalized understanding.    OT Time  Start Time: 1007  Stop Time: 1030  Time Calculation (min): 23 min    OT Charges  $OT Evaluation Mod Complex 45 Min: 1 Procedure              Problem List  Patient Active Problem List   Diagnosis    Spasmodic torticollis    Cerebellar ataxia (CMS-HCC)    Neuropathy    Chronic cough    Primary hypertension    HLD (hyperlipidemia)    Postoperative CSF leak    Oropharyngeal dysphagia    Cervical spinal cord injury (CMS-HCC)    CSF leak    Neuropathic pain    OSA (obstructive  sleep apnea)    Pressure injury of deep tissue of dorsum of right foot    Pressure injury of deep tissue of toe of left foot    Pressure injury of left foot, unstageable (CMS-HCC)    H/O excision of lamina of cervical vertebra for decompression of spinal cord      Past Medical History  Past Medical History:   Diagnosis Date    Abnormal gait     Cancer (CMS-HCC)     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    Environmental allergies     Hearing loss     left (since age 76), right more recent    High cholesterol     Hypertension     Neck symptom     Trouble turning head to the left     Numbness     Hands and Feet    Sleep apnea     Sleep disorder      Tingling     Hands and Feet    Weakness      Past Surgical History  Past Surgical History:   Procedure Laterality Date    ACHILLES TENDON REPAIR  01/31/2002    left     APPENDECTOMY      CERVICAL FUSION N/A 12/30/2021    Procedure: C3-C5 POSTERIOR CERVICAL LAMINECTOMY AND FUSION;  Surgeon: Denyse DagoJoseph S Cheng, MD;  Location: [facility] OR;  Service: Neurosurgery;  Laterality: N/A;    HERNIA REPAIR  02/01/1951    INCISION AND DRAINAGE POSTERIOR LUMBAR SPINE N/A 10/30/2021    Procedure: possible lumbar drain;  Surgeon: Tempie HoistBenjamin Motley, MD;  Location: [facility] OR;  Service: Neurosurgery;  Laterality: N/A;    IRRIGATION AND DEBRIDEMENT SPINE N/A 10/30/2021    Procedure: CSF leak repair, exploration of the neck;  Surgeon: Tempie HoistBenjamin Motley, MD;  Location: [facility] OR;  Service: Neurosurgery;  Laterality: N/A;    KNEE SURGERY Left 1976    SPINE SURGERY      TONSILLECTOMY      WISDOM TOOTH EXTRACTION

## 2021-12-31 NOTE — Unmapped (Signed)
[facility]  Case Management/Social Work Department  Progress Note    Patient Information     Patient Name: Matthew [name]  MRN: [id number]  Hospital day: 1  Inpatient/Observation:  Inpatient   Level of Care:  floor   Admit date:  12/30/2021  Admission diagnosis: Cervical myelopathy (CMS-HCC) [G95.9]    PMH:  has a past medical history of Abnormal gait, Cancer (CMS-HCC), Environmental allergies, Hearing loss, High cholesterol, Hypertension, Neck symptom, Numbness, Sleep apnea, Sleep disorder, Tingling, and Weakness.    PCP:  Demetrius Charity, MD    [facility]    [facility] [address] [place] [facility] [address]  [address] [facility]  [place] [address]  Phone: [phone]     [facility]  [address]  [place] [address]  Phone: [phone]         Medical Insurance Coverage:  Payor: MEDICARE / Plan: MEDICARE A AND B / Product Type: Medicare /     Other Pertinent Information   RN Care Coordinator participated in interdisciplinary rounds with the MD team and completed chart review. Patient may clear for discharge later today per rounds, pending PTOT, 1 drain , may be removed today.     Discharge Plan     Anticipated discharge plan:  pending PTOT      Anticipated discharge date:  later today pending above     CM/SW will continue to follow and remain available for discharge planning needs.        Elliot Dally RN CCM  [phone]

## 2021-12-31 NOTE — Unmapped (Signed)
[facility]  DEPARTMENT OF NEUROSURGERY  INPATIENT PROGRESS NOTE  Matthew [name]  [id number]  Feb 06, 1945    HD: 2 POD: 1    Neurosurgery Attending: Sena Hitch, MD    Interval History:  *NAEO; neuro stable   *Drain w 10 cc; plan to DC today     Meds:  Current Facility-Administered Medications   Medication Dose Frequency Provider Last Admin    acetaminophen  975 mg 3 times per day Molli Barrows, MD 975 mg at 12/31/21 0445    amLODIPine  5 mg Daily 0900 Molli Barrows, MD 5 mg at 12/31/21 6606    atorvastatin  40 mg Nightly (2100) Molli Barrows, MD 40 mg at 12/30/21 2025    brimonidine  1 drop BID Molli Barrows, MD 1 drop at 12/31/21 0854    cetirizine  10 mg Daily 0900 Molli Barrows, MD 10 mg at 12/31/21 3016    DULoxetine  60 mg Daily 0900 Molli Barrows, MD 60 mg at 12/31/21 0109    finasteride  5 mg Daily 0900 Molli Barrows, MD 5 mg at 12/31/21 0828    fluticasone propionate  1 spray Daily 0900 Molli Barrows, MD 1 spray at 12/31/21 3235    heparin  5,000 Units 3 times per day Molli Barrows, MD 5,000 Units at 12/31/21 0446    HYDROmorphone  0.5 mg Q4H PRN Daryn Virginia Rochester, MD      Or    HYDROmorphone  1 mg Q4H PRN Molli Barrows, MD      latanoprost  1 drop Nightly (2100) Molli Barrows, MD      lidocaine  1 patch Daily 0900 Molli Barrows, MD 1 patch at 12/31/21 0855    melatonin  9 mg Nightly (2100) Molli Barrows, MD 9 mg at 12/30/21 2346    methocarbamoL  1,000 mg Q8H Daryn Virginia Rochester, MD 1,000 mg at 12/31/21 5732    Followed by    methocarbamoL  750 mg QID Molli Barrows, MD      oxyCODONE  5 mg Q4H PRN Molli Barrows, MD 5 mg at 12/31/21 0446    Or    oxyCODONE  10 mg Q4H PRN Daryn Virginia Rochester, MD      polyethylene glycol  17 g BID PRN Molli Barrows, MD      pregabalin  150 mg Daily 0900 Molli Barrows, MD 150 mg at 12/31/21 2025    pregabalin  300 mg Nightly (2100)  Molli Barrows, MD 300 mg at 12/30/21 2344    senna-docusate  1 tablet BID Daryn Virginia Rochester, MD 1 tablet at 12/31/21 4270    sodium chloride 0.9 %  75 mL/hr Continuous Molli Barrows, MD 75 mL/hr at 12/30/21 2149    timolol  1 drop Q12H Molli Barrows, MD 1 drop at 12/31/21 0828    vancomycin  15 mg/kg Q12H Denyse Dago, MD 1,250 mg at 12/31/21 0002       Examination:  Temp:  [97.2 F (36.2 C)-98.2 F (36.8 C)] 97.6 F (36.4 C)  Heart Rate:  [56-78] 58  Resp:  [9-24] 17  BP: (87-165)/(59-88) 114/64  Arterial Line BP: (84-117)/(39-50) 117/50  FiO2:  [43 %-95 %] 81 %    General: NAD  Wound: c/d/i    Neurological  -GCS: 15  -Orientation: alert, oriented x 3 (person, place, and time)  -Language: speech fluent and appropriate  -  Cranial Nerves: PERRL, EOMI, face symmetric, hearing intact, tongue midline   -Motor: FCCx4, full and symmetric strength and ROM    D EF EE G IO   RUE:    4+ 5 5 5 5    LUE:     4+ 5 5 5 5     HF KE DF EHL PF   RLE: 5 5 5 5 5    LLE: 5 5 5 5 5   -Sensation: sensation intact to light touch and symmetric in bilateral upper and lower extremities      Labs:  Na/K/Cl/CO2:  140/3.9/103/30 (12/01 0545)  WBC/Hgb/Hct/Plts:  7.3/11.3/32.5/119 (12/01 0545)       Imaging Review  No interval imaging.      ASSESMENT & PLAN  76 y.o. 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 He is now POD0 s/p C3-5 posterior cervical decompression and onlay fusion     PLAN:  - Floor; routine neuro checks   - continue home meds  - pain control; please utilize muscle relaxants heavily   - bowel reg  - Regular diet   - AAT; no brace   - UR xrays pending   - Consults: PT/OT  - Will DC drain  today   - Daily CBC & BMP   - DC foley   -Leave dressing on until instructed; once removed leave incision open to air   - Complete periop ABX   - PT/OT pending   - DVT prophylaxis: SCDs & [facility]   - Dispo: Pending PT; likely today     Please page [phone] for any questions     If further questions or concerns should arise, do not hesitate to contact the neurosurgery resident on call, [phone].    Derrill Center, APRN  Neurosurgery  Pager# [phone]  Please Page NS Day pager at Pager [phone] if unable to reach  12/31/2021  9:37 AM

## 2021-12-31 NOTE — Unmapped (Signed)
[facility]  Inpatient Discharge Summary     Patient: Matthew [name]  Age: 76 y.o.    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       Operations/Procedures Performed (include dates)     Surgeries:  Surgical/Procedural Cases on this Admission       Case IDs Date Procedure Surgeon Location Status    [id number] 12/30/21 C3-C5 POSTERIOR CERVICAL LAMINECTOMY AND FUSION Denyse Dago, MD [facility]            Lines and tubes:  Patient Lines/Drains/Airways Status       Active Line / PIV Line       Name Placement date Placement time Site Days    Peripheral IV 12/30/21 Anterior;Left Forearm 12/30/21  1320  Forearm  less than 1    Peripheral IV 12/30/21 Right Wrist 12/30/21  1415  Wrist  less than 1                    Other Procedures / Pertinent Imaging:  X-ray Cervical Spine 2 or 3-views   Final Result   IMPRESSION:      1.  C3-C5 laminectomy with improved alignment.         Report Verified by: Arville Care, MD at 12/31/2021 12:04 PM EST      Fluoro up to 1 hour   Final Result   IMPRESSION: Intraoperative fluoroscopic guidance was performed for clinical service without a radiologist in attendance. A total of 6  secondsof fluoroscopy time was used. 5 spot images were provided for documentation.      Report Verified by: Leda Roys, MD at 12/30/2021 5:47 PM EST      X-ray Cervical Spine 2 or 3-views   Final Result   IMPRESSION: Intraoperative fluoroscopic guidance was performed for clinical service without a radiologist in attendance. A total of 6 secondsof fluoroscopy time was used. 5 spot images were provided for documentation.      Report Verified by: Leda Roys, MD at 12/30/2021 5:47 PM EST            Consulting Services (include reason)   PT   OT   Case management       Allergies     Allergies   Allergen Reactions  Animal Dander      Causes congestion    Adhesive Tape-Silicones Rash       Discharge Medications        Medication List        TAKE these medications, which are NEW        Quantity/Refills   lidocaine 5 %  Commonly known as: LIDODERM  Place 1 patch onto the skin daily. Apply patch for 12 hours and then remove patch and leave off for 12 hours.  Start taking on: January 01, 2022   Quantity: 30 patch  Refills: 0     oxyCODONE 5 MG immediate release tablet  Commonly known as: ROXICODONE  Take 1 tablet (5 mg total) by mouth every 4 hours as needed for up to 7 days.  Replaces: oxyCODONE 5 mg Tbor   Quantity: 28 tablet  Refills: 0     tiZANidine 2 MG tablet  Commonly known as: ZANAFLEX  Take 1 tablet (2 mg total) by mouth every 6 hours as needed.   Quantity: 120 tablet  Refills: 0            TAKE these medication, which have CHANGED        Quantity/Refills   aspirin 81 MG EC tablet  Take 1 tablet (81 mg total) by mouth daily.  Start taking on: January 14, 2022  What changed: These instructions start on January 14, 2022. If you are unsure what to do until then, ask your doctor or other care provider.   Refills: 0     senna-docusate 8.6-50 mg per tablet  Commonly known as: SENNA-S  Take 1 tablet by mouth at bedtime.  What changed: when to take this   Quantity: 30 tablet  Refills: 0            TAKE  these medications, which you were ALREADY TAKING        Quantity/Refills   acetaminophen 325 MG tablet  Commonly known as: TYLENOL  Take 2 tablets (650 mg total) by mouth every 6 hours.   Quantity: 30 tablet  Refills: 0     albuterol 90 mcg/actuation inhaler  Commonly known as: PROVENTIL  Inhale 2 puffs into the lungs every 6 hours as needed for Wheezing or Shortness of Breath.   Refills: 0     ALPHA LIPOIC ACID ORAL  Take 1 capsule by mouth daily.   Refills: 0     amLODIPine 5 MG tablet  Commonly known as: NORVASC  Take 1 tablet (5 mg total) by mouth daily.   Refills: 0     azelastine 137 mcg (0.1 %) nasal spray  Commonly known as: ASTELIN  Use 2 sprays into each nostril 2 times a day.   Quantity: 30 mL  Refills: 6     bimatoprost 0.01 % Drop  Commonly known as: LUMIGAN  Place 1 drop into both eyes daily.   Refills: 0     brimonidine 0.2 % ophthalmic solution  Commonly known as: ALPHAGAN  Place 1 drop into the right eye 2 times a day.   Refills: 0     cetirizine 10 MG tablet  Commonly known as: ZYRTEC  Take 1 tablet (10 mg total) by mouth daily.   Quantity: 30 tablet  Refills: 11     doxepin 10 MG capsule  Commonly known as: SINEQUAN  Take 1 capsule (10 mg total) by mouth at bedtime as needed.   Refills: 0     DULoxetine 60 MG capsule  Commonly known as: CYMBALTA  Take 1 capsule (60 mg total) by mouth daily.   Refills: 0     finasteride 5 mg tablet  Commonly known as: PROSCAR  Take 0.5 tablets (2.5 mg total) by mouth daily.   Refills: 0     fluticasone propionate 50 mcg/actuation nasal spray  Commonly known as: FLONASE  Use 1 spray into each nostril daily.   Refills: 0     melatonin 3 mg Tab  Take 3 tablets (9 mg total) by mouth at bedtime.   Refills: 0     methocarbamoL 500 MG tablet  Commonly known as: ROBAXIN  Take 1 tablet (500 mg total) by mouth 3 times a day as needed.   Quantity: 90 tablet  Refills: 0     naloxone 4 mg/actuation Spry  Commonly known as: NARCAN  Apply 1 spray in one nostril if needed. Call  911. May repeat dose in other nostril if no response in 3 minutes.   Quantity: 2 each  Refills: 1     pregabalin 150 MG capsule  Commonly known as: LYRICA  Take 1 capsule (150 mg total) by mouth. 1 tablet in the AM and 2 tablets in the evening.   Refills: 0     rosuvastatin 10 MG tablet  Commonly known as: CRESTOR  Take 1 tablet (10 mg total) by mouth every evening.   Quantity: 30 tablet  Refills: 5     tadalafiL 20 MG tablet  Commonly known as: CIALIS  Take 1 tablet (20 mg total) by mouth daily as needed for Erectile Dysfunction.   Refills: 0     timolol 0.5 % ophthalmic solution  Commonly known as: TIMOPTIC  Place 1 drop into both eyes every 12 hours.   Refills: 0            STOP taking these medications      baclofen 10 MG tablet  Commonly known as: LIORESAL     gabapentin 100 MG capsule  Commonly known as: NEURONTIN     oxyCODONE 5 mg Tbor  Commonly known as: OXAYDO  Replaced by: oxyCODONE 5 MG immediate release tablet     QUEtiapine 25 MG tablet  Commonly known as: SEROQUEL               Where to Get Your Medications        These medications were sent to [facility]  [address], [place] [address]      Hours: Sunday - Saturday: 8:00AM - 6:00PM Phone: [phone]   lidocaine 5 %  naloxone 4 mg/actuation Spry  oxyCODONE 5 MG immediate release tablet  senna-docusate 8.6-50 mg per tablet  tiZANidine 2 MG tablet       Information about where to get these medications is not yet available    Ask your nurse or doctor about these medications  aspirin 81 MG EC tablet             Discharge Exam     Physical Exam  Examination:  Temp:  [97.2 F (36.2 C)-98.2 F (36.8 C)] 97.6 F (36.4 C)  Heart Rate:  [56-78] 58  Resp:  [9-24] 17  BP: (87-165)/(59-88) 114/64  Arterial Line BP: (84-117)/(39-50) 117/50  FiO2:  [43 %-95 %] 81 %     General: NAD  Wound: c/d/i     Neurological  -GCS: 15  -Orientation: alert, oriented x 3 (person, place, and time)  -Language: speech fluent and  appropriate  -  Cranial Nerves: PERRL, EOMI, face symmetric, hearing intact, tongue midline   -Motor: FCCx4, full and symmetric strength and ROM                                D            EF          EE          G             IO                 RUE:      4+          5             5             5             5                  LUE:      4+          5             5             5             5                                 HF          KE          DF          EHL       PF                 RLE:      5             5             5             5             5                  LLE:       5             5             5             5             5   -Sensation: sensation intact to light touch and symmetric in bilateral upper and lower extremities    Reason for Admission     Gina Costilla is a 76 y.o. 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 He is now s/p C3-5 posterior cervical decompression and onlay fusion        Hospital Course     Active Hospital Problems    Diagnosis Date Noted    H/O excision of lamina of cervical vertebra for decompression of spinal cord [Z98.890] 12/31/2021  Resolved Hospital Problems   No resolved problems to display.     Patient presented on day of surgery with stable symptoms and exam findings, and was taken to the OR for C3-C5 posterior decompression with onlay fusion.  They tolerated the procedure well and awoke with expected incisional pain.  Following an appropriate recovery period in the post-anesthesia care unit, the patient was admitted for further monitoring. Their hospital stay consisted of the following:     Post-op Imaging was completed and reviewed,  demonstrating intact hardware   Drain removed on POD1  No brace is required  PT/OT evaluated, recommending: out patient PT/OT    After a period of stable neuro exam and vital signs it was determined the patient was ready to be discharged to home. At time of discharge the patient had good oral intake and was ambulating well. Discharge medications and wound care instructions were reviewed with the patient and their family. Follow up appointments have been scheduled.   Follow up: 01/13/22        Condition on Discharge     1. Functional Status: normal   Describe limitations, if any:     2. Mental Status: Alert/Oriented   Describe limitations, if any:     3. Dietary Restrictions / Tube Feeding / TPN  Diet/Nutrition Orders    Diet regular     Frequency: Effective Now     Number of Occurrences: Until Specified     Order Questions:      Suicide/Behavior Risk Modification? No    Dietary nutrition supplements     Frequency: TID     Number of Occurrences: Until Specified     Order Comments: Vanilla please       Order Questions:      Select Supplement: Boost Plus-high calorie high protein supplement     Regular Diet    4. Discharge specific orders:   DISCHARGE INSTRUCTIONS AFTER YOUR SPINE SURGERY:     RULES FOR THE CARE OF YOUR INCISION:    You MUST wash your incision with gentle soap and water at least daily, more frequently if soiled or sweaty.      It is okay to shower and get your incision wet. No submerging your incision underwater as in a bathtub, pool or hot tub.    Please leave your incision open to air unless otherwise directed.     DO NOT touch your incision without first washing your hands. Avoid touching your incision as much as possible.    Please call your neurosurgeon's office immediately if you notice any of the following: increased redness, swelling, or any drainage from your incision or if you start running a fever > 101.5    QUESTIONS OR CONCERNS:  If you have a question or concern regarding your care after  you get home you can:    Call the Spine Help line [phone].  This line is only available during daytime hours, no weekends or holidays.    Our office number is [phone] or you are welcome to send a message on MyChart.      If you have an urgent concern during an evening, weekend, or holiday call the neurosurgery resident on call. Dial [phone] and ask the operator to have the on call neurosurgery resident called.     ACTIVITY AFTER YOUR SURGERY:     - Progressively increase your activity as tolerated following the recommendations of any inpatient or outpatient postoperative therapy recommendations.  - No bending at waist,  lifting >10lbs, no sudden twisting.    - Nothing more strenuous than daily activities or walking.  No running, jogging, swimming, etc...until cleared by neurosurgeon in outpatient follow up.    - Do not drive unless instructed otherwise by your neurosurgeon.  Avoid driving/operating dangerous or heavy machinery while on pain medications     MEDICATION INSTRUCTIONS:     - Wean off narcotic pain medications as tolerated.    - Recommend vitamin D and calcium supplement during first 8-12 weeks after spinal fusion, if not already taking.  - Hold Aspirin, Plavix, Coumadin, Heparin or other anticoagulant/antiplatelet agents for 2 weeks after surgery unless instructed otherwise.  These medications may increase your risk of bleeding.     - Do not take NSAIDs which includes Ibuprofen/Advil, naproxen/Aleve, Celecoxib/Celebrex.  These should not be taken for 3 months after your surgery.  - Constipation is a common side effect of opiate pain medications; you should take a daily stool softener to help prevent constipation from developing while on narcotic pain medications.  One or two medications should have been provided at discharge to prophylaxis against constipation. Should you experience constipation despite these medications, contact our office should the condition worsen or should you  stop passing gas.      Helpful hints after back surgery     ACTIVITY   Walking is encouraged. At least a 5 minute walk every hour during waking hours.   No other form of exercises until follow up appointment   May go up stairs  May ride in a car but no driving until follow up appointment  No sexual activity until follow up appointment  No heavy lifting over 10 lbs. (milk carton)  No lifting of objects over your head  No cleaning (vacuuming, cutting grass, etc) until follow up appointment   No bending at the waist. Bend with your knees  Use of computer limited to 1 hour 3 times a day    DIET  No special diet  Drink fluids to stay hydrated  Avoid constipation use stool softeners    WOUND  OK to shower but no tub baths  Remove dressing after 2 days  If your incision has steri-strips, let these naturally fall off. If they are still present in one week, you may gently remove them  Any staples or sutures will be removed at your follow-up visit 2 weeks after your surgery  Inspect wound daily for redness or drainage    IF THE INCISION HAS DERMABOND ADHESIVE PLEASE FOLLOW THESE RULES:    1) Do not apply liquid or ointment medications or cleansers onto wounds closed with Dermabond Adhesive, as these substances can weaken the polymerized film leading to skin edge separation.   2)  Do not pick at the wound or film/glue, as this can disrupt its adhesion and cause dehiscence or opening of the wound.   3) Allow the glue to slough naturally (usually in 5-10 days).    4)  There should be only occasional wetting of the wound, but patients may shower and bathe the site gently.  Do not soak or scrub the site or expose it to prolonged wetness until the film has completely sloughed off naturally and the wound has healed closed.      PAIN MEDICATION  You will receive narcotics for 2 weeks and Tylenol for pain  You may receive muscle relaxants for muscle spasms     WHEN TO CALL YOUR DOCTOR  Temperature greater than 101 F  Drainage  from the  wound site  New onset of weakness or numbness of extremities.   If not previously scheduled, call the office for a follow up appointment for 2 weeks after your surgery [phone])    5. Core measures followed: (if this is a core measure patient)  Discharge Weight: 169 lb (76.7 kg)            Disposition     Home with assistance      Follow-Up Appointments     Future Appointments   Date Time Provider Department Center   12/31/2021  8:05 PM [facility]   01/13/2022 12:30 PM Derrill Center, CNP [facility] NSUR [facility]   01/17/2022  2:00 PM Loura Halt, MD [facility] NEUR [facility]     No follow-up provider specified.    Signed:    Derrill Center, CNP  12/31/2021, 12:25 PM

## 2021-12-31 NOTE — Unmapped (Signed)
[facility]    Case Manager/Social Worker Discharge Summary     Patient name: Matthew [name]                                        Patient MRN: [id number]  DOB: 04-01-1945                              Age: 76 y.o.              Gender: male  Patient emergency contact: Extended Emergency Contact Information  Primary Emergency Contact: Wisniewski,Therese Training and development officer)  Address: [address], [address]           [place], [place] [address] [place]  Home Phone: [phone]  Relation: Spouse  Secondary Emergency Contact: [name]  Address: [address]           [place], [place] [address] [place] Phone: [phone]  Relation: Daughter      Attending provider: Denyse Dago, MD  Primary care physician: Demetrius Charity, MD    The MD has indicated that the patient is ready for discharge.  [name] medically cleared for discharge today , to resume out patient PTOT.  The patient will be transported by     at afternoon    Transfer Mode/Level of Care: Family    The plan has been reviewed:     Patient/Family Informed of Discharge Plan: Yes    Plan Reviewed With Patient, Family, or Significant Other: Yes    Patient and or family are aware and in agreement with the discharge plan: Yes    Family Member Name and Relationship Notified at Discharge: Shanon Brow, spouse   Family Contact Number: at bedside    Plan reviewed with MD and other members of the health care team: Yes  Care Plan Completed: Yes    No further CM/SW needs.    This plan has been reviewed with the multi-disciplinary team.        Post Acute Care Provider Information:     Community Services at Discharge  Community Services at Home post discharge: Outpatient Therapy    Elliot Dally RN CCM  [phone]

## 2021-12-31 NOTE — Unmapped (Signed)
DISCHARGE INSTRUCTIONS AFTER YOUR SPINE SURGERY:     RULES FOR THE CARE OF YOUR INCISION:    You MUST wash your incision with gentle soap and water at least daily, more frequently if soiled or sweaty.      It is okay to shower and get your incision wet. No submerging your incision underwater as in a bathtub, pool or hot tub.    Please leave your incision open to air unless otherwise directed.     DO NOT touch your incision without first washing your hands. Avoid touching your incision as much as possible.    Please call your neurosurgeon's office immediately if you notice any of the following: increased redness, swelling, or any drainage from your incision or if you start running a fever > 101.5    QUESTIONS OR CONCERNS:  If you have a question or concern regarding your care after you get home you can:    Call the Spine Help line [phone].  This line is only available during daytime hours, no weekends or holidays.    Our office number is [phone] or you are welcome to send a message on MyChart.      If you have an urgent concern during an evening, weekend, or holiday call the neurosurgery resident on call. Dial [phone] and ask the operator to have the on call neurosurgery resident called.     ACTIVITY AFTER YOUR SURGERY:     - Progressively increase your activity as tolerated following the recommendations of any inpatient or outpatient postoperative therapy recommendations.  - No bending at waist, lifting >10lbs, no sudden twisting.    - Nothing more strenuous than daily activities or walking.  No running, jogging, swimming, etc...until cleared by neurosurgeon in outpatient follow up.    - Do not drive unless instructed otherwise by your neurosurgeon.  Avoid driving/operating dangerous or heavy machinery while on pain medications     MEDICATION INSTRUCTIONS:     - Wean off narcotic pain medications as tolerated.    - Recommend vitamin D and calcium supplement during first 8-12 weeks after spinal  fusion, if not already taking.  - Hold Aspirin, Plavix, Coumadin, Heparin or other anticoagulant/antiplatelet agents for 2 weeks after surgery unless instructed otherwise.  These medications may increase your risk of bleeding.     - Do not take NSAIDs which includes Ibuprofen/Advil, naproxen/Aleve, Celecoxib/Celebrex.  These should not be taken for 3 months after your surgery.  - Constipation is a common side effect of opiate pain medications; you should take a daily stool softener to help prevent constipation from developing while on narcotic pain medications.  One or two medications should have been provided at discharge to prophylaxis against constipation. Should you experience constipation despite these medications, contact our office should the condition worsen or should you stop passing gas.      Helpful hints after back surgery     ACTIVITY   Walking is encouraged. At least a 5 minute walk every hour during waking hours.   No other form of exercises until follow up appointment   May go up stairs  May ride in a car but no driving until follow up appointment  No sexual activity until follow up appointment  No heavy lifting over 10 lbs. (milk carton)  No lifting of objects over your head  No cleaning (vacuuming, cutting grass, etc) until follow up appointment   No bending at the waist. Bend with your knees  Use of computer limited to   1 hour 3 times a day    DIET  No special diet  Drink fluids to stay hydrated  Avoid constipation use stool softeners    WOUND  OK to shower but no tub baths  Remove dressing after 2 days  If your incision has steri-strips, let these naturally fall off. If they are still present in one week, you may gently remove them  Any staples or sutures will be removed at your follow-up visit 2 weeks after your surgery  Inspect wound daily for redness or drainage    IF THE INCISION HAS DERMABOND ADHESIVE PLEASE FOLLOW THESE RULES:    1) Do not apply liquid or ointment medications or cleansers  onto wounds closed with Dermabond Adhesive, as these substances can weaken the polymerized film leading to skin edge separation.   2)  Do not pick at the wound or film/glue, as this can disrupt its adhesion and cause dehiscence or opening of the wound.   3) Allow the glue to slough naturally (usually in 5-10 days).    4)  There should be only occasional wetting of the wound, but patients may shower and bathe the site gently.  Do not soak or scrub the site or expose it to prolonged wetness until the film has completely sloughed off naturally and the wound has healed closed.      PAIN MEDICATION  You will receive narcotics for 2 weeks and Tylenol for pain  You may receive muscle relaxants for muscle spasms     WHEN TO CALL YOUR DOCTOR  Temperature greater than 101 F  Drainage from the wound site  New onset of weakness or numbness of extremities.   If not previously scheduled, call the office for a follow up appointment for 2 weeks after your surgery ([phone])

## 2021-12-31 NOTE — Unmapped (Signed)
I responded to an Epic Consult referral. Pt is being discharged and had no need for a visit.     Fr Evelina Dun  Catholic chaplain  Spiritual Care Dept

## 2022-01-03 NOTE — Unmapped (Signed)
OUTPATIENT THERAPY NO SHOW/ CANCEL NOTE    The Outpatient Therapy Attendance Policy which states 2 consecutive no-shows or 3 missed sessions within the last 30 days may result in discharge from therapy. Cancellation less than 24 hours before the appointment time is considered a no-show. Additionally, the policy states therapy may be unable to be provided if patient arrives late by 15 minutes or more.      DATE Type of Cancellation Telephone Contact?   01/03/22 Late cancellation Pt called scheduling, cancelled appt due to not feeling well                 Antonieta Iba OTR/L    01/03/2022

## 2022-01-03 NOTE — Unmapped (Signed)
Provider spoke with patient and addressed questions.

## 2022-01-03 NOTE — Unmapped (Signed)
OUTPATIENT THERAPY NO SHOW/ CANCEL NOTE    The Outpatient Therapy Attendance Policy which states 2 consecutive no-shows or 3 missed sessions within the last 30 days may result in discharge from therapy. Cancellation less than 24 hours before the appointment time is considered a no-show. Additionally, the policy states therapy may be unable to be provided if patient arrives late by 15 minutes or more.      DATE Type of Cancellation Telephone Contact?   01/03/2022 Late cancellation - PT evaluation Pt called day of appointment to cancel d/t not feeling well.                  Ferman HammingRIN W Mariyah Upshaw  01/03/2022

## 2022-01-13 ENCOUNTER — Ambulatory Visit: Admit: 2022-01-13 | Discharge: 2022-01-17 | Payer: MEDICARE

## 2022-01-13 ENCOUNTER — Inpatient Hospital Stay: Admit: 2022-01-13 | Discharge: 2022-01-17 | Payer: MEDICARE

## 2022-01-13 DIAGNOSIS — Z981 Arthrodesis status: Secondary | ICD-10-CM

## 2022-01-13 NOTE — Unmapped (Signed)
Neurosurgery Clinic Visit    Reason for Visit / Chief Complaint:   Chief Complaint   Patient presents with    Post-op Evaluation       History of Present Illness: Matthew [name] is a 76 y.o. 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  He presents today for post-operative visit with stable loss of balance.  He also reports new hand dysesthesias he reports that feels like a band is around his fingers.  He reports good pain control but is not sleeping well and is curious about increasing his muscle relaxants. He also reports continued loss of hand dexterity making it difficult to pick up pills. Denies fevers, drainage, difficulty breathing, and difficulty swallowing.     HPI    Past Medical, Surgical, Family and Social History:  Past Medical History:   Diagnosis Date    Abnormal gait     Cancer (CMS-HCC)     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    Environmental allergies     Hearing loss     left (since age 33), right more recent    High cholesterol     Hypertension     Neck symptom     Trouble turning head to the left     Numbness     Hands and Feet    Sleep apnea     Sleep disorder     Tingling     Hands and Feet    Weakness        Past Surgical History:   Procedure Laterality Date    ACHILLES TENDON REPAIR  01/31/2002    left     APPENDECTOMY      CERVICAL FUSION N/A 12/30/2021    Procedure: C3-C5 POSTERIOR CERVICAL LAMINECTOMY AND FUSION;  Surgeon: Denyse Dago, MD;  Location: [facility] OR;  Service: Neurosurgery;  Laterality: N/A;    HERNIA REPAIR  [birth year]01/1951    INCISION AND DRAINAGE POSTERIOR LUMBAR SPINE N/A 10/30/2021    Procedure: possible lumbar drain;  Surgeon: Tempie Hoist, MD;  Location: [facility] OR;  Service: Neurosurgery;  Laterality: N/A;    IRRIGATION AND DEBRIDEMENT SPINE N/A 10/30/2021    Procedure: CSF leak repair,  exploration of the neck;  Surgeon: Tempie Hoist, MD;  Location: [facility] OR;  Service: Neurosurgery;  Laterality: N/A;    KNEE SURGERY Left 1976    SPINE SURGERY      TONSILLECTOMY      WISDOM TOOTH EXTRACTION         Social History     Socioeconomic History    Marital status: Married     Spouse name: Not on file    Number of children: Not on file    Years of education: Not on file    Highest education level: Not on file   Occupational History    Not on file   Tobacco Use    Smoking status: Never    Smokeless tobacco: Never   Vaping Use    Vaping Use: Never used   Substance and Sexual Activity    Alcohol use: Not Currently     Alcohol/week: 4.0 standard drinks of alcohol     Types: 2 Glasses of wine, 2 Cans of beer per week    Drug use: No     Comment: 02/16/10    Sexual activity:  Not on file   Other Topics Concern    Caffeine Use Yes    Occupational Exposure Not Asked    Exercise Yes    Seat Belt Yes   Social History Narrative    Not on file     Social Determinants of Health     Financial Resource Strain: Not on file   Food Insecurity: No Food Insecurity (01/12/2022)    Yearly Questionnaire     Do you need any assistance with obtaining housing, meals, medication, transportation or medical equipment?: No     Assistance needed for:: Not on file   Transportation Needs: No Transportation Needs (01/12/2022)    Yearly Questionnaire     Do you need any assistance with obtaining housing, meals, medication, transportation or medical equipment?: No     Assistance needed for:: Not on file   Physical Activity: Not on file   Stress: Not on file   Social Connections: Not on file   Intimate Partner Violence: Not At Risk (12/30/2021)    Humiliation, Afraid, Rape, and Kick questionnaire     Fear of Current or Ex-Partner: No     Emotionally Abused: No     Physically Abused: No     Sexually Abused: No   Housing Stability: Low Risk  (01/12/2022)    Yearly Questionnaire     Do you need any assistance with obtaining housing, meals,  medication, transportation or medical equipment?: No     Assistance needed for:: Not on file       The patient has a family history of   Family History   Problem Relation Age of Onset    Colon Cancer Mother     Heart failure Father        History from paper and electronic chart reviewed. Patient has no additional relevant past medical, past surgical, family, or social history.    Medications:  Current Outpatient Medications   Medication Sig    acetaminophen Take 2 tablets (650 mg total) by mouth every 6 hours.    albuterol Inhale 2 puffs into the lungs every 6 hours as needed for Wheezing or Shortness of Breath.    amLODIPine Take 1 tablet (5 mg total) by mouth daily.    azelastine Use 2 sprays into each nostril 2 times a day.    bimatoprost Place 1 drop into both eyes daily.    brimonidine Place 1 drop into the right eye 2 times a day.    cetirizine Take 1 tablet (10 mg total) by mouth daily.    doxepin Take 1 capsule (10 mg total) by mouth at bedtime as needed.    DULoxetine Take 1 capsule (60 mg total) by mouth daily.    finasteride Take 0.5 tablets (2.5 mg total) by mouth daily.    fluticasone propionate Use 1 spray into each nostril daily.    lidocaine Place 1 patch onto the skin daily. Apply patch for 12 hours and then remove patch and leave off for 12 hours.    melatonin Take 3 tablets (9 mg total) by mouth at bedtime.    methocarbamoL Take 1 tablet (500 mg total) by mouth 3 times a day as needed.    naloxone Apply 1 spray in one nostril if needed. Call 911. May repeat dose in other nostril if no response in 3 minutes.    pregabalin Take 1 capsule (150 mg total) by mouth. 1 tablet in the AM and 2 tablets in the evening.    rosuvastatin  Take 1 tablet (10 mg total) by mouth every evening.    senna-docusate Take 1 tablet by mouth at bedtime.    tadalafiL Take 1 tablet (20 mg total) by mouth daily as needed for Erectile Dysfunction.    timolol Place 1 drop into both eyes every 12 hours.    tiZANidine Take 1  tablet (2 mg total) by mouth every 6 hours as needed.    ALPHA LIPOIC ACID ORAL Take 1 capsule by mouth daily.    [START ON 01/14/2022] aspirin Take 1 tablet (81 mg total) by mouth daily.    senna-docusate Take 1 tablet by mouth 2 times a day.     No current facility-administered medications for this visit.       Medications from paper and electronic chart reviewed.  Patient has no additional pertinent medications    Physical Exam:    Vitals:    01/13/22 1224   BP: 109/66   BP Location: Right upper arm   Patient Position: Sitting   BP Cuff Size: Large   Pulse: 59   Resp: 18   Temp: 98.1 F (36.7 C)   TempSrc: Oral   SpO2: 97%   Weight: 174 lb 14.4 oz (79.3 kg)   Height: 5' 11 (1.803 m)       Physical Exam:  Vitals:  Vitals:    01/13/22 1224   BP: 109/66   Pulse: 59   Resp: 18   Temp: 98.1 F (36.7 C)   SpO2: 97%       General:  in no apparent distress and well developed and well nourished; appears stated age  HENT:   NC/AT, external ears normal to inspection, hearing normal  Eye:   normal conjunctiva/cornea, no scleral icterus, no injection, EOM intact, PERRL  Pulm:  no respiratory distress   CV:   No pedal edema  MSK:   Normal muscle bulk with no contractures or deformities  Wound/Incision:  with no signs of drainage, redness, healing as expected    Neurologic Exam:  Mental Status: Alert/Oriented to person, place and day  Speech: Normal, Answers questions appropriately  Cranial Nerves: Face symmetric, tongue midline, hearing & vision grossly intact   Memory:  recent and remote memory intact    Motor: Moving all extremities spontaneously, full ROM, symmetric and full strength in all extremities  Sensation  Sensation intact to light touch in C5-T1 dermatomes  Sensation intact to light touch in L2-S1 dermatomes    Myelopathic gait; abnormal romberg   + Hoffman's       Imaging reviewed:   X-ray Scoliosis Survey AP-Lat 2-3 vws  Narrative: EXAM: XR SCOLIOSIS SURVEY AP AND LATERAL 2-3 VIEWS      INDICATION:  S/P  cervical spinal fusion    TECHNIQUE:  XR SCOLIOSIS SURVEY AP AND LATERAL 2-3 VIEWS AP and lateral radiographs were performed.    COMPARISON: 12/31/2021    FINDINGS:    8 radiographs of the thoracolumbar spine were obtained for a scoliosis series. Thoracic kyphosis is maintained with straightening of the lumbar lordosis. There is mild to moderate multilevel thoracic and lumbar degenerative disc disease with moderate to severe lower lumbar facet arthrosis. There is minimal retrolisthesis of L2 on L3, L3 on L4, and L4 on L5. No acute fractures although the upper thoracic spine is partially obscured due to overlapping structures. Partially visualized C3-C4 anterior fixation. Paravertebral soft tissues are unremarkable. Severe bilateral hip arthrosis with joint space loss and osteophyte formation.  Impression: IMPRESSION:  1.  Mild to moderate multilevel thoracic and lumbar degenerative disc disease and lumbar spondylosis without acute osseous findings.  2.  Multilevel spondylolisthesis with retrolisthesis of L2 on L3, L3 on L4, and L4 on L5.  3.  Severe bilateral hip osteoarthritis.    Report Verified by: Osvaldo Shipper, MD at 01/13/2022 4:33 PM EST        Assessment  Matthew [name] 76 y.o. male with Matthew [name] was seen today for post-op evaluation.    Diagnoses and all orders for this visit:    S/P cervical spinal fusion  -     X-ray Scoliosis Survey AP-Lat 2-3 vws; Future    DISH (diffuse idiopathic skeletal hyperostosis)          1. S/P cervical spinal fusion  X-ray Scoliosis Survey AP-Lat 2-3 vws      2. DISH (diffuse idiopathic skeletal hyperostosis)              Plan  Matthew [name] is a 76 y.o. 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  He presents today for post-operative visit with stable  loss of balance.  He also reports new hand dysesthesias he reports that feels like a band is around his fingers.  He reports good pain control but is not sleeping well and is curious about increasing his muscle relaxants. He also reports continued loss of hand dexterity making it difficult to pick up pills. Denies fevers, drainage, difficulty breathing, and difficulty swallowing. Pt moving to [place] in one month.   In summary pt presents with improved strength but new dysesthesias in his hands and stable loss of balance.  He also reports worsening loss of hands dexterity related to his previous myelopathy.  Recommend pt be seen by OT and PT before moving to [place]; also recommend continued PT/OT once established in [place].  Will increase tizanidine and have pt RTC in 1 week to be seen before moving.    Portions of this clinic note was copied forward from my last progress note. I have reviewed and updated the history, physical exam, data, assessment and plan of the note so it reflects the evaluation and management of the patient on today's date   Today's body mass index is 24.39 kg/m. BMI is normal.    I spent a total of 20 minutes obtaining a history, examining, and coordinating care for this patient.  This time spent includes the details in my assessment and plan above and also the following activities performed on the same day as the encounter: preparing to see the patient (eg, review of tests), counseling and educating the family/caregiver , ordering medications, tests, or procedures, referring and communicating with other health care professionals , documenting clinical information in the electronic or other health record, independently interpreting results and communicating results to the patient/family/caregiver, providing care coordination , and performing non-face-to-face activities           Thank You    Derrill Center, CNP  01/13/2022  [facility]  [place] [facility]  AT [facility]  [address], [address]  [place] [address]  Dept: [phone]  Loc: [phone]

## 2022-01-17 ENCOUNTER — Ambulatory Visit: Admit: 2022-01-17 | Payer: MEDICARE | Attending: Neurology

## 2022-01-17 ENCOUNTER — Ambulatory Visit: Payer: MEDICARE | Attending: Neurology

## 2022-01-17 DIAGNOSIS — G243 Spasmodic torticollis: Secondary | ICD-10-CM

## 2022-01-17 MED ORDER — abobotulinum toxin A (DYSPORT) 500 unit SolR
500 | INTRAMUSCULAR
Start: 2022-01-17 — End: 2022-01-17

## 2022-01-17 MED ORDER — abobotulinum toxin A (DYSPORT) SolR 1,000 Units
500 | Freq: Once | INTRAMUSCULAR | Status: AC
Start: 2022-01-17 — End: 2022-01-17
  Administered 2022-01-17: 20:00:00 625 [IU] via INTRAMUSCULAR

## 2022-01-17 MED FILL — DYSPORT 500 UNIT INTRAMUSCULAR SOLUTION: 500 500 unit | INTRAMUSCULAR | Qty: 2

## 2022-01-17 NOTE — Unmapped (Signed)
Subjective:      Patient ID: Matthew [name] is a 76 y.o. male.    Movement Disorders HPI    Matthew [name] returned for his reinjection session with Dysport to address his left torticollis and mild left shoulder elevation. Response has been excellent. He is recovering from extensive neck surgery by Dr. Sena Hitch. He is beginning to feel better. We again did not use EMG guidance into the right sternocleidomastoid, left splenius capitis, and left trapezius (painful area) because of the vasovagal syncope which complicated an old injection session. We did away from the lower scapular muscles given lack of symptoms there.        Histories:     He has a past medical history of Abnormal gait, Cancer (CMS-HCC), Environmental allergies, Hearing loss, High cholesterol, Hypertension, Neck symptom, Numbness, Sleep apnea, Sleep disorder, Tingling, and Weakness.    He has a past surgical history that includes Hernia repair (02/01/1950); Achilles tendon repair (01/31/2002); Appendectomy; Tonsillectomy; Wisdom tooth extraction; Knee surgery (Left, 1976); Spine surgery; irrigation and debridement spine (N/A, 10/30/2021); Incision and drainage posterior lumbar spine (N/A, 10/30/2021); and Cervical fusion (N/A, 12/30/2021).    His family history includes Colon Cancer in his mother; Heart failure in his father.    He reports that he has never smoked. He has never used smokeless tobacco. He reports that he does not currently use alcohol after a past usage of about 4.0 standard drinks of alcohol per week. He reports that he does not use drugs.    Review of Systems  Refer to HPI for review of systems documentation.    Allergies:   Animal dander and Adhesive tape-silicones    Medications:     Outpatient Encounter Medications as of 01/17/2022   Medication Sig Dispense Refill    acetaminophen (TYLENOL) 325 MG tablet Take 2 tablets (650 mg total) by mouth every 6 hours. 30 tablet     albuterol 90 mcg/actuation Inhl inhaler Inhale 2  puffs into the lungs every 6 hours as needed for Wheezing or Shortness of Breath.      ALPHA LIPOIC ACID ORAL Take 1 capsule by mouth daily.      amLODIPine (NORVASC) 5 MG tablet Take 1 tablet (5 mg total) by mouth daily.      aspirin 81 MG EC tablet Take 1 tablet (81 mg total) by mouth daily.      azelastine (ASTELIN) 137 mcg (0.1 %) nasal spray Use 2 sprays into each nostril 2 times a day. 30 mL 6    bimatoprost (LUMIGAN) 0.01 % Drop Place 1 drop into both eyes daily.      brimonidine (ALPHAGAN) 0.2 % ophthalmic solution Place 1 drop into the right eye 2 times a day.      cetirizine (ZYRTEC) 10 MG tablet Take 1 tablet (10 mg total) by mouth daily. 30 tablet 11    doxepin (SINEQUAN) 10 MG capsule Take 1 capsule (10 mg total) by mouth at bedtime as needed.      DULoxetine (CYMBALTA) 60 MG capsule Take 1 capsule (60 mg total) by mouth daily.      finasteride (PROSCAR) 5 mg tablet Take 0.5 tablets (2.5 mg total) by mouth daily.      fluticasone propionate (FLONASE) 50 mcg/actuation nasal spray Use 1 spray into each nostril daily.      lidocaine (LIDODERM) 5 % Place 1 patch onto the skin daily. Apply patch for 12 hours and then remove patch and leave off for 12 hours. 30 patch 0  melatonin 3 mg Tab Take 3 tablets (9 mg total) by mouth at bedtime.  0    methocarbamoL (ROBAXIN) 500 MG tablet Take 1 tablet (500 mg total) by mouth 3 times a day as needed. 90 tablet 0    naloxone (NARCAN) 4 mg/actuation Spry Apply 1 spray in one nostril if needed. Call 911. May repeat dose in other nostril if no response in 3 minutes. 2 each 1    pregabalin (LYRICA) 150 MG capsule Take 1 capsule (150 mg total) by mouth. 1 tablet in the AM and 2 tablets in the evening.      rosuvastatin (CRESTOR) 10 MG tablet Take 1 tablet (10 mg total) by mouth every evening. 30 tablet 5    senna-docusate (SENNA-S) 8.6-50 mg per tablet Take 1 tablet by mouth at bedtime. 30 tablet 0    senna-docusate (SENNA-S) 8.6-50 mg per tablet Take 1 tablet by mouth 2  times a day. 30 tablet 0    tadalafiL (CIALIS) 20 MG tablet Take 1 tablet (20 mg total) by mouth daily as needed for Erectile Dysfunction.      timolol (TIMOPTIC) 0.5 % ophthalmic solution Place 1 drop into both eyes every 12 hours.      tiZANidine (ZANAFLEX) 2 MG tablet Take 1 tablet (2 mg total) by mouth every 6 hours as needed. 120 tablet 0    [EXPIRED] oxyCODONE (ROXICODONE) 5 MG immediate release tablet Take 1 tablet (5 mg total) by mouth every 4 hours as needed for up to 7 days. 28 tablet 0    [DISCONTINUED] aspirin 81 MG EC tablet Take 1 tablet (81 mg total) by mouth daily.      [DISCONTINUED] baclofen (LIORESAL) 10 MG tablet Take 1 tablet (10 mg total) by mouth daily as needed (Muscle spasms).      [DISCONTINUED] gabapentin (NEURONTIN) 100 MG capsule Take 2 capsules (200 mg total) by mouth 3 times a day. 90 capsule 0    [DISCONTINUED] naloxone (NARCAN) 4 mg/actuation Spry Apply 1 spray in one nostril if needed. Call 911. May repeat dose in other nostril if no response in 3 minutes. 2 each 1    [DISCONTINUED] oxyCODONE (OXAYDO) 5 mg TbOr Take 1 tablet (5 mg total) by mouth every 6 hours as needed for Pain. **DO NOT CRUSH OR HALVE**      [DISCONTINUED] QUEtiapine (SEROQUEL) 25 MG tablet Take 1 tablet (25 mg total) by mouth at bedtime.      [DISCONTINUED] senna-docusate (SENNA-S) 8.6-50 mg per tablet Take 1 tablet by mouth 2 times a day. 30 tablet 0     Facility-Administered Encounter Medications as of 01/17/2022   Medication Dose Route Frequency Provider Last Rate Last Admin    abobotulinum toxin A (DYSPORT) 500 unit SolR             [COMPLETED] barium sulfate (VARIBAR NECTAR) suspension 70 mL  70 mL Oral ONCE PRN Jasper Riling, MD   70 mL at 12/28/21 1152    [COMPLETED] celecoxib (CELEBREX) capsule 200 mg  200 mg Oral BID Molli Barrows, MD   200 mg at 12/31/21 0828    [COMPLETED] dexAMETHasone (DECADRON) tablet 8 mg  8 mg Oral Once Molli Barrows, MD   8 mg at 12/31/21 0828    [COMPLETED]  VARI-BAR (barium) Pste 3 teaspoonful  3 teaspoonful Oral ONCE PRN Jasper Riling, MD   3 teaspoonful at 12/28/21 1152    [COMPLETED] VARIBAR HONEY (barium) suspension 5 mL  5 mL Oral  ONCE PRN Jasper Riling, MD   5 mL at 12/28/21 1152    [COMPLETED] VARIBAR THIN (barium) Powd 70 mL  70 mL Oral ONCE PRN Jasper Riling, MD   70 mL at 12/28/21 1152    [DISCONTINUED] acetaminophen (TYLENOL) tablet 975 mg  975 mg Oral 3 times per day Molli Barrows, MD   975 mg at 12/31/21 0445    [DISCONTINUED] amLODIPine (NORVASC) tablet 5 mg  5 mg Oral Daily 0900 Molli Barrows, MD   5 mg at 12/31/21 1610    [DISCONTINUED] atorvastatin (LIPITOR) tablet 40 mg  40 mg Oral Nightly (2100) Molli Barrows, MD   40 mg at 12/30/21 2025    [DISCONTINUED] brimonidine (ALPHAGAN) 0.2 % ophthalmic solution 1 drop  1 drop Right Eye BID Molli Barrows, MD   1 drop at 12/31/21 0854    [DISCONTINUED] cetirizine (ZYRTEC) tablet 10 mg  10 mg Oral Daily 0900 Molli Barrows, MD   10 mg at 12/31/21 9604    [DISCONTINUED] DULoxetine (CYMBALTA) DR capsule 60 mg  60 mg Oral Daily 0900 Molli Barrows, MD   60 mg at 12/31/21 5409    [DISCONTINUED] finasteride (PROSCAR) tablet 5 mg  5 mg Oral Daily 0900 Molli Barrows, MD   5 mg at 12/31/21 8119    [DISCONTINUED] fluticasone propionate (FLONASE) 50 mcg/actuation nasal spray 1 spray  1 spray Each Nare Daily 0900 Molli Barrows, MD   1 spray at 12/31/21 1478    [DISCONTINUED] heparin (porcine) injection 5,000 Units  5,000 Units Subcutaneous 3 times per day Molli Barrows, MD   5,000 Units at 12/31/21 0446    [DISCONTINUED] HYDROmorphone (DILAUDID) injection Syrg 0.5 mg  0.5 mg Intravenous Q4H PRN Molli Barrows, MD        [DISCONTINUED] HYDROmorphone (DILAUDID) injection Syrg 1 mg  1 mg Intravenous Q4H PRN Molli Barrows, MD        [DISCONTINUED] latanoprost (XALATAN) 0.005 % ophthalmic solution 1 drop  1 drop Both Eyes Nightly (2100) Molli Barrows, MD        [DISCONTINUED] lidocaine (LIDODERM) 5 % 1 patch  1 patch Transdermal Daily 0900 Molli Barrows, MD   1 patch at 12/31/21 0855    [DISCONTINUED] melatonin tablet Tab 9 mg  9 mg Oral Nightly (2100) Molli Barrows, MD   9 mg at 12/30/21 2346    [DISCONTINUED] methocarbamoL (ROBAXIN) injection 1,000 mg  1,000 mg Intravenous Q8H Daryn Virginia Rochester, MD   1,000 mg at 12/31/21 2956    [DISCONTINUED] methocarbamoL (ROBAXIN) tablet 750 mg  750 mg Oral QID Molli Barrows, MD        [DISCONTINUED] oxyCODONE (ROXICODONE) immediate release tablet 10 mg  10 mg Oral Q4H PRN Molli Barrows, MD        [DISCONTINUED] oxyCODONE (ROXICODONE) immediate release tablet 5 mg  5 mg Oral Q4H PRN Molli Barrows, MD   5 mg at 12/31/21 0446    [DISCONTINUED] polyethylene glycol (MIRALAX) packet 17 g  17 g Oral BID PRN Molli Barrows, MD        [DISCONTINUED] pregabalin (LYRICA) capsule 150 mg  150 mg Oral Daily 0900 Molli Barrows, MD   150 mg at 12/31/21 2130    [DISCONTINUED] pregabalin (LYRICA) capsule 300 mg  300 mg Oral Nightly (2100) Molli Barrows, MD   300 mg at 12/30/21 2344    [DISCONTINUED] senna-docusate (SENNA-S) 8.6-50 mg  per tablet 1 tablet  1 tablet Oral BID Molli Barrows, MD   1 tablet at 12/31/21 4696    [DISCONTINUED] sodium chloride 0.9 % infusion  75 mL/hr Intravenous Continuous Daryn Virginia Rochester, MD 75 mL/hr at 12/30/21 2149 75 mL/hr at 12/30/21 2149    [DISCONTINUED] timolol (TIMOPTIC) 0.5 % ophthalmic solution 1 drop  1 drop Both Eyes Q12H Molli Barrows, MD   1 drop at 12/31/21 0828    [DISCONTINUED] vancomycin (VANCOCIN) 1,250 mg in sodium chloride 0.9% 250 mL ADDaptor IVPB  15 mg/kg Intravenous Q12H Denyse Dago, MD   Held at 12/31/21 1259       Objective:       Blood pressure 119/70, pulse 65, SpO2 98%.    Botulinum Toxin Injection Summary  Was the time out preformed?: Yes  Botulinum toxin used: Dysport  Dilution  (units/volume): 500:2  Total units used: 625  Number of units discarded: 375     Muscle 1  Muscle 1: L Splenius capitus  Muscle 1 - Units: 375  Muscle 1 - # Sites: 6  Muscle 2  Muscle 2: R Sternocleidomastoid  Muscle 2 - Units: 125  Muscle 2 - # Sites: 2  Muscle 3  Muscle 3: L Trapezius  Muscle 3 - Units: 125  Muscle 3 - # Sites: 2  Muscle 4  Muscle 4:  (L Subscapular muscles)  Muscle 4 - Units: 0  Muscle 4 - # Sites: 0       Assessment:     1. Spasmodic torticollis       Non-EMG-guided injections with Dysport took were given to address his left torticollis and mild left shoulder elevation. The procedure was tolerated well.     Plan:     A six month reinjection was recommended this time, should the need arise. They will have moved to [place] but wish to continue getting the reinjections done here.

## 2022-01-17 NOTE — Unmapped (Signed)
Chart Prep    Am I scheduled with the correct MD/APP  yes  Am I scheduled with the correct visit type yes  All Imaging in pacs  yes  All testing and recommendations completed  na  Do I have a shunt no  Do I have an appointment scheduled 24 hours after MRI no  Do I have images placed prior to my appointment no  All imaging pulled from [facility] into pacs  na  All imaging requested from outside facilities sent to imagingrequests to merge  na  If pt is scheduled in [facility] and needs xrays prior did I call pt to have them arrive at least 30 minutes early and notify where xray is located?  na  Checked decision tree for images  yes  Checked and updated care everywhere for images, notes, etc  yes  Additional comments

## 2022-01-19 ENCOUNTER — Ambulatory Visit: Admit: 2022-01-19 | Discharge: 2022-02-01 | Payer: MEDICARE

## 2022-01-19 DIAGNOSIS — Z981 Arthrodesis status: Secondary | ICD-10-CM

## 2022-01-19 MED ORDER — tiZANidine (ZANAFLEX) 4 MG tablet
4 | ORAL_TABLET | Freq: Three times a day (TID) | ORAL | 0 refills | Status: AC
Start: 2022-01-19 — End: 2022-04-19

## 2022-01-19 NOTE — Unmapped (Signed)
Neurosurgery Clinic Visit    Reason for Visit / Chief Complaint:   Chief Complaint   Patient presents with    Follow-up       History of Present Illness: Matthew [name] is a 76 y.o. 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  He presents today for post-operative visit with stable loss of balance and improving pain control.  He is planning to move to [place] and is requesting referrals to provider close to his new home.      HPI    Past Medical, Surgical, Family and Social History:  Past Medical History:   Diagnosis Date    Abnormal gait     Cancer (CMS-HCC)     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    Environmental allergies     Hearing loss     left (since age 89), right more recent    High cholesterol     Hypertension     Neck symptom     Trouble turning head to the left     Numbness     Hands and Feet    Sleep apnea     Sleep disorder     Tingling     Hands and Feet    Weakness        Past Surgical History:   Procedure Laterality Date    ACHILLES TENDON REPAIR  01/31/2002    left     APPENDECTOMY      CERVICAL FUSION N/A 12/30/2021    Procedure: C3-C5 POSTERIOR CERVICAL LAMINECTOMY AND FUSION;  Surgeon: Denyse Dago, MD;  Location: [facility] OR;  Service: Neurosurgery;  Laterality: N/A;    HERNIA REPAIR  [birth year]/01/1951    INCISION AND DRAINAGE POSTERIOR LUMBAR SPINE N/A 10/30/2021    Procedure: possible lumbar drain;  Surgeon: Tempie Hoist, MD;  Location: [facility] OR;  Service: Neurosurgery;  Laterality: N/A;    IRRIGATION AND DEBRIDEMENT SPINE N/A 10/30/2021    Procedure: CSF leak repair, exploration of the neck;  Surgeon: Tempie Hoist, MD;  Location: [facility] OR;  Service: Neurosurgery;  Laterality: N/A;    KNEE SURGERY Left 1976    SPINE SURGERY      TONSILLECTOMY      WISDOM TOOTH EXTRACTION         Social History     Socioeconomic  History    Marital status: Married     Spouse name: Not on file    Number of children: Not on file    Years of education: Not on file    Highest education level: Not on file   Occupational History    Not on file   Tobacco Use    Smoking status: Never    Smokeless tobacco: Never   Vaping Use    Vaping Use: Never used   Substance and Sexual Activity    Alcohol use: Not Currently     Alcohol/week: 4.0 standard drinks of alcohol     Types: 2 Glasses of wine, 2 Cans of beer per week    Drug use: Never    Sexual activity: Not on file   Other Topics Concern    Caffeine Use Yes    Occupational Exposure Not Asked    Exercise Yes    Seat Belt Yes   Social History Narrative    Not on file  Social Determinants of Health     Financial Resource Strain: Not on file   Food Insecurity: No Food Insecurity (01/17/2022)    Yearly Questionnaire     Do you need any assistance with obtaining housing, meals, medication, transportation or medical equipment?: Yes     Assistance needed for:: Transportation   Transportation Needs: Unmet Transportation Needs (01/17/2022)    Yearly Questionnaire     Do you need any assistance with obtaining housing, meals, medication, transportation or medical equipment?: Yes     Assistance needed for:: Transportation   Physical Activity: Not on file   Stress: Not on file   Social Connections: Not on file   Intimate Partner Violence: Not At Risk (12/30/2021)    Humiliation, Afraid, Rape, and Kick questionnaire     Fear of Current or Ex-Partner: No     Emotionally Abused: No     Physically Abused: No     Sexually Abused: No   Housing Stability: Low Risk  (01/17/2022)    Yearly Questionnaire     Do you need any assistance with obtaining housing, meals, medication, transportation or medical equipment?: Yes     Assistance needed for:: Transportation       The patient has a family history of   Family History   Problem Relation Age of Onset    Colon Cancer Mother     Heart failure Father        History from paper  and electronic chart reviewed. Patient has no additional relevant past medical, past surgical, family, or social history.    Medications:  Current Outpatient Medications   Medication Sig    acetaminophen Take 2 tablets (650 mg total) by mouth every 6 hours. (Patient taking differently: Take 2 tablets (650 mg total) by mouth if needed for Pain.)    albuterol Inhale 2 puffs into the lungs every 6 hours as needed for Wheezing or Shortness of Breath.    ALPHA LIPOIC ACID ORAL Take 1 capsule by mouth daily.    amLODIPine Take 1 tablet (5 mg total) by mouth daily.    aspirin Take 1 tablet (81 mg total) by mouth daily. (Patient taking differently: Take 1 tablet (81 mg total) by mouth if needed (Stroke prevention).)    azelastine Use 2 sprays into each nostril 2 times a day.    bimatoprost Place 1 drop into both eyes daily.    brimonidine Place 1 drop into the right eye 2 times a day.    cetirizine Take 1 tablet (10 mg total) by mouth daily.    DULoxetine Take 1 capsule (60 mg total) by mouth daily.    finasteride Take 0.5 tablets (2.5 mg total) by mouth daily.    fluticasone propionate Use 1 spray into each nostril daily.    lidocaine Place 1 patch onto the skin daily. Apply patch for 12 hours and then remove patch and leave off for 12 hours. (Patient taking differently: Place 1 patch onto the skin if needed.)    melatonin Take 3 tablets (9 mg total) by mouth at bedtime.    naloxone Apply 1 spray in one nostril if needed. Call 911. May repeat dose in other nostril if no response in 3 minutes.    pregabalin Take 1 capsule (150 mg total) by mouth. 1 tablet in the AM and 2 tablets in the evening.    rosuvastatin Take 1 tablet (10 mg total) by mouth every evening.    senna-docusate Take 1 tablet by mouth 2  times a day. (Patient taking differently: Take 1 tablet by mouth if needed for Constipation.)    tadalafiL Take 1 tablet (20 mg total) by mouth daily as needed for Erectile Dysfunction.    timolol Place 1 drop into both eyes  every 12 hours.    doxepin Take 1 capsule (10 mg total) by mouth at bedtime as needed.    methocarbamoL Take 1 tablet (500 mg total) by mouth 3 times a day as needed.    senna-docusate Take 1 tablet by mouth at bedtime.    tiZANidine Take 1 tablet (4 mg total) by mouth 3 times a day.     No current facility-administered medications for this visit.       Medications from paper and electronic chart reviewed.  Patient has no additional pertinent medications    Physical Exam:    Vitals:    01/19/22 1311   BP: 102/65   BP Location: Right upper arm   Patient Position: Sitting   BP Cuff Size: Regular   Pulse: 66   Temp: 95.6 F (35.3 C)   TempSrc: Oral   SpO2: 98%   Weight: 180 lb 3.2 oz (81.7 kg)   Height: 5' 11 (1.803 m)       General:          in no apparent distress and well developed and well nourished; appears stated age  MSK:               Normal muscle bulk with no contractures or deformities  Wound/Incision:  with no signs of drainage, redness, healing as expected     Neurologic Exam:  Mental Status: Alert/Oriented to person, place and day  Speech: Normal, Answers questions appropriately  Cranial Nerves: Face symmetric, tongue midline, hearing & vision grossly intact   Memory:          recent and remote memory intact     Motor: Moving all extremities spontaneously, full ROM, symmetric and full strength in all extremities  Sensation  Sensation intact to light touch in C5-T1 dermatomes  Sensation intact to light touch in L2-S1 dermatomes     Myelopathic gait; abnormal romberg   + Hoffman's     Imaging reviewed:   X-ray Scoliosis Survey AP-Lat 2-3 vws  Narrative: EXAM: XR SCOLIOSIS SURVEY AP AND LATERAL 2-3 VIEWS      INDICATION:  S/P cervical spinal fusion    TECHNIQUE:  XR SCOLIOSIS SURVEY AP AND LATERAL 2-3 VIEWS AP and lateral radiographs were performed.    COMPARISON: 12/31/2021    FINDINGS:    8 radiographs of the thoracolumbar spine were obtained for a scoliosis series. Thoracic kyphosis is maintained with  straightening of the lumbar lordosis. There is mild to moderate multilevel thoracic and lumbar degenerative disc disease with moderate to severe lower lumbar facet arthrosis. There is minimal retrolisthesis of L2 on L3, L3 on L4, and L4 on L5. No acute fractures although the upper thoracic spine is partially obscured due to overlapping structures. Partially visualized C3-C4 anterior fixation. Paravertebral soft tissues are unremarkable. Severe bilateral hip arthrosis with joint space loss and osteophyte formation.  Impression: IMPRESSION:    1.  Mild to moderate multilevel thoracic and lumbar degenerative disc disease and lumbar spondylosis without acute osseous findings.  2.  Multilevel spondylolisthesis with retrolisthesis of L2 on L3, L3 on L4, and L4 on L5.  3.  Severe bilateral hip osteoarthritis.    Report Verified by: Osvaldo Shipper, MD at 01/13/2022 4:33 PM EST  Assessment  Matthew [name] 76 y.o. male with Matthew [name] was seen today for follow-up.    Diagnoses and all orders for this visit:    S/P cervical spinal fusion  -     Physical Medicine Rehab  -     Spine  -     Pain Clinic  -     X-ray Cervical Spine 2 or 3-views; Future    DISH (diffuse idiopathic skeletal hyperostosis)  -     Physical Medicine Rehab  -     Spine  -     Pain Clinic  -     X-ray Cervical Spine 2 or 3-views; Future    Other orders  -     tiZANidine (ZANAFLEX) 4 MG tablet; Take 1 tablet (4 mg total) by mouth 3 times a day.  Dispense: 270 tablet; Refill: 0          1. S/P cervical spinal fusion  Physical Medicine Rehab    Spine    Pain Clinic    X-ray Cervical Spine 2 or 3-views      2. DISH (diffuse idiopathic skeletal hyperostosis)  Physical Medicine Rehab    Spine    Pain Clinic    X-ray Cervical Spine 2 or 3-views            Plan  Matthew [name] is a 76 y.o. male w/w/ history of cervical dystonia, DISH and cervical myelopathy with cervical disc disease an OPLL s/p C3-C4 ACDF (9/19) with subsequent wound revision with  CSF repair (9/30) now presents s/p C3-5 posterior cervical decompression and onlay fusion.  He presents today for post-operative visit with stable loss of balance and improving pain control.  He is planning to move to [place] and is requesting referrals to provider close to his new home.    Referrals sent for PT/OT, PM&R, and neurosurgery.  Will have pt RTC PRN.  Patient is ok per [facility] to seek out new neurosurgery consultation and treatment.   Today's body mass index is 25.13 kg/m. BMI is above normal. Discussion was deferred due to other visit priorities.    I spent a total of 20 minutes obtaining a history, examining, and coordinating care for this patient.  This time spent includes the details in my assessment and plan above and also the following activities performed on the same day as the encounter: preparing to see the patient (eg, review of tests), ordering medications, tests, or procedures, referring and communicating with other health care professionals , documenting clinical information in the electronic or other health record, independently interpreting results and communicating results to the patient/family/caregiver, providing care coordination , and performing non-face-to-face activities       Thank You    Derrill Center, CNP  01/19/2022  [facility]  [facility]  [address], [address]  [place] [address]  Dept: [phone]  Loc: [phone]

## 2022-01-19 NOTE — Unmapped (Signed)
An After Visit Summary was printed and given to the patient.    Follow up as needed

## 2022-01-20 MED ORDER — oxyCODONE (ROXICODONE) 5 MG immediate release tablet
5 | ORAL_TABLET | Freq: Four times a day (QID) | ORAL | 0 refills | PRN
Start: 2022-01-20 — End: 2022-01-21

## 2022-01-20 NOTE — Unmapped (Signed)
[facility] Email *This email is from a [employer] sender* [facility] Email   Security Reminder: Links or attachments may still be threats if the user's account is compromised. If you believe this email is not legitimate, contact the [employer] [facility].   No problem, [name]. I'll ask Jeanice Lim to send a referral request to them along with a copy of our records (probably some of the beefy full-evaluation notes and a couple of our injection procedure notes). The CANVAS testing results won't be available for another 3-4 months.    All the best,  [name]    From: [email or web address]  Date: Wednesday, January 19, 2022 at 11:54 AM  To: Loura Halt ([email or web address]  Cc: [email or web address]  Subject: Referrals for Matthew [name], DOB 1946-01-17  External Email: Use Caution      Dear Dr. Waunita Schooner,    I have spent hours researching neurologists. I appreciate the ones you also recommended, but I have narrowed down my list for four in our area. None will accept a new patient without a referral and some also require the referral to see if Matthew [name] will fit into their sub-specialty areas. That's why I have four names to try to get into. They are listed in order of preference.    Would you please fax referrals, lab work, reports, recommendations for follow-up treatment, information about the Dysport  injections and genetics testing begun for CANVAS to:    1.  Ninetta Lights, M.D.       [phone] (fax)    2.  Canary Brim, M.D.        [phone] (fax)    3. Phillips Hay, M.D.        [phone] (fax)    4.  Mitzi Hansen, M.D.        [phone] (fax)    Thank you so much.  [name]  [phone]

## 2022-01-21 MED ORDER — oxyCODONE (ROXICODONE) 5 MG immediate release tablet
5 | ORAL_TABLET | ORAL | 0 refills | 6.00000 days | Status: AC | PRN
Start: 2022-01-21 — End: 2022-02-20

## 2022-01-21 MED ORDER — senna-docusate (SENNA-S) 8.6-50 mg per tablet
8.6-50 | ORAL_TABLET | Freq: Every evening | ORAL | 0 refills | Status: AC
Start: 2022-01-21 — End: ?

## 2022-01-26 ENCOUNTER — Encounter: Admit: 2022-01-26 | Discharge: 2022-01-26 | Payer: MEDICARE

## 2022-01-26 DIAGNOSIS — Z9889 Other specified postprocedural states: Secondary | ICD-10-CM

## 2022-01-26 NOTE — Unmapped (Signed)
Occupational Therapy Initial Evaluation    Name: Matthew [name]  Date of Birth: 09-Aug-1945  MRN: [id number]    Date of evaluation: 01/26/2022    Referring Physician: Edwyna Ready, MD  [address]  [place],  [place] [address] Phone: [phone]  Fax: [phone]  Specific Order: Evaluate and treat   Date of Onset: 12/30/2021  Primary Medical Diagnosis: V45.89 (ICD-9-CM) - Z98.890 (ICD-10-CM) - H/O excision of lamina of cervical vertebra for decompression of spinal cord      Insurance: Payor: MEDICARE / Plan: MEDICARE A AND B / Product Type: Medicare /     Occupational Profile and History    History and Reason for Referral: Matthew [name] is a 76 y.o. male who presents today with chief complaint of impaired balance , decreased hand coordination ,decreased functional endurance, peripheral neuropathy impacting FMC R>L, and decreased IADL performances/p cervical decompression surgery (3 surgeries in total starting in September 2023). Pt arrives to session ambulatory with hurrycane.  He has been riding his exercise bike. Pt only uses the hurrycane out in the community. Denies falls and states strength is improving. Pt has been doing some light weights and using ankle weights on bike. Pt plans to move to [place] at the end of next week. Leaves on 02/03/22. Pt has been driving, states he was cleared by MD. Pt's wife has found therapy clinic in [place].    Referring MD Notes:  Antionio Schmelzle is a 76 y.o. 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  He presents today for post-operative visit with stable loss of balance and improving pain control.  He is planning to move to [place] and is requesting referrals to provider close to his new home.    Referrals sent for PT/OT, PM&R, and  neurosurgery.  Will have pt RTC PRN.  Patient is ok per [facility] to seek out new neurosurgery consultation and treatment.     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  Post operative course complicated by delirium, dysphagia (NPO - silent aspiration), pain and insomnia. Pt presenting to [facility] for [facility] care. On arrival he had two pressure injuries to his L foot which were not previously seen by patient or his wife; his wife noted he has a hx of peripheral neuropathy. He had episodes of delirium at the hospital that involve him discussing court cases to himself (he is a prior Conservation officer, historic buildings) which required the initiation of Seroquel. Pain also was a limiting factor for the patient in returning to his prior functional baseline.    PMH/PSH:   Past Medical History:   Diagnosis Date    Abnormal gait     Cancer (CMS-HCC)     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    Environmental allergies     Hearing loss     left (since age 89), right more recent    High cholesterol     Hypertension     Neck symptom     Trouble turning head to the left     Numbness     Hands and Feet  Sleep apnea     Sleep disorder     Tingling     Hands and Feet    Weakness    ,   Past Surgical History:   Procedure Laterality Date    ACHILLES TENDON REPAIR  01/31/2002    left     APPENDECTOMY      CERVICAL FUSION N/A 12/30/2021    Procedure: C3-C5 POSTERIOR CERVICAL LAMINECTOMY AND FUSION;  Surgeon: Denyse Dago, MD;  Location: [facility] OR;  Service: Neurosurgery;  Laterality: N/A;    HERNIA REPAIR  02/01/1951    INCISION AND DRAINAGE POSTERIOR LUMBAR SPINE N/A 10/30/2021    Procedure: possible lumbar drain;  Surgeon: Tempie Hoist, MD;  Location: [facility] OR;  Service:  Neurosurgery;  Laterality: N/A;    IRRIGATION AND DEBRIDEMENT SPINE N/A 10/30/2021    Procedure: CSF leak repair, exploration of the neck;  Surgeon: Tempie Hoist, MD;  Location: [facility] OR;  Service: Neurosurgery;  Laterality: N/A;    KNEE SURGERY Left 1976    SPINE SURGERY      TONSILLECTOMY      WISDOM TOOTH EXTRACTION         Medications: He has a current medication list which includes the following prescription(s): acetaminophen, albuterol, alpha lipoic acid, amlodipine, aspirin, azelastine, bimatoprost, brimonidine, cetirizine, doxepin, duloxetine, finasteride, fluticasone propionate, lidocaine, melatonin, methocarbamol, naloxone, oxycodone, pregabalin, rosuvastatin, senna-docusate, senna-docusate, tadalafil, timolol, and tizanidine.    Precautions/Contraindications: Cervical surgery, no bracing    Pain: Patient reported 0/10.     Environment   Physical:   Home / Living Situation:    Patient lives in a/an:  5th floor apartment   Entrance into the home: patient has no Higher education careers adviser devices: patient uses BSC, grab bars, and walker.Has shower bench without back, Does not currently use BSC Further assessment of AD is recommended.   Bathroom set up: patient has a walk in shower.       Social:   Patient lives with: a spouse  Support: yes  Involvement: yes         Prior and Current Level of Function: Information obtained by report and functional assessment     Prior level of function Current level of function Comments   ADLs      Feeding Independent (patient performs without assistance or equipment) Independent (patient performs without assistance or equipment)    Bathing Independent (patient performs without assistance or equipment) Modified Independent (requires use of equipment or extra time to complete task, but no assistance or supervision from caregiver)    Dressing Independent (patient performs without assistance or equipment) Modified Independent (requires use of equipment or extra time to  complete task, but no assistance or supervision from caregiver) Pre tuie shoes and slip foot in and out with shoe horn    Issues with ADL fasteners.   Grooming Independent (patient performs without assistance or equipment) Modified Independent (requires use of equipment or extra time to complete task, but no assistance or supervision from caregiver) Completes while seated due to LE fatigue    Difficulty with flossing has tried tooth picks     Toileting Independent (patient performs without assistance or equipment) Independent (patient performs without assistance or equipment)    IADLs      Meal prep Independent (patient performs without assistance or equipment) Total Assist (patient performs <24% of the task or requires more than one person to assist) Has not attempted since discharge- would like to address    Has concerns regarding item  transport/retrieval   Housekeeping Independent (patient performs without assistance or equipment) Total Assist (patient performs <24% of the task or requires more than one person to assist) Has not attempted since being back home, likes to wash dishes   Laundry Independent (patient performs without assistance or equipment) Total Assist (patient performs <24% of the task or requires more than one person to assist) Wife completes at baseline   Finances Independent (patient performs without assistance or equipment) Independent (patient performs without assistance or equipment)    Shopping  Independent (patient performs without assistance or equipment) Supervision/set-up (requires set-up of equipment or supervision from caregiver, but no physical contact or assist) Was primary grocercy, reports able to tolerate 15-20 mins   Medication management  Independent (patient performs without assistance or equipment) Total Assist (patient performs <24% of the task or requires more than one person to assist) Wife has been completing, spouse has to grind up pills   Technology (phone/ computer/  tablet) Independent (patient performs without assistance or equipment) Modified Independent (requires use of equipment or extra time to complete task, but no assistance or supervision from caregiver) Uses index finger for hunt/pick method during computer usage    Endorses increased frustration with phone management due to neuropathy. Decreased accuracy, increased time for texting/navigating phone   Handwriting  Independent (patient performs without assistance or equipment) Independent (patient performs without assistance or equipment) Denies concerns   Sleep       Recreation/ Leisure Independent (patient performs without assistance or equipment) Modified Independent (requires use of equipment or extra time to complete task, but no assistance or supervision from caregiver) Likes to be on computer, shopping, reads (has noticed decreased concentration, has gotten better)    Loved to be tennis, exercises   Transfers      Toilet Independent (patient performs without assistance or equipment) Independent (patient performs without assistance or equipment) Uses toilet seat to push from for slight leverage   , denies LOB with sit<>stands   Therapist, sports (patient performs without assistance or equipment) Modified Independent (requires use of equipment or extra time to complete task, but no assistance or supervision from caregiver) Stands 50% of shower, limited by LE fatigue   Bed Mobility  Independent (patient performs without assistance or equipment) Independent (patient performs without assistance or equipment) Adjustable bed, denies concerns   Work Independent (patient performs without assistance or equipment) Retired Retired Therapist, 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    Spouse has been providing all transportation   Mental Health                        Upper Extremity Assessment  Hand dominance: right  Subluxation present? No    Edema:   No edema    Muscle Tone:   Affected Upper Extremity: bilateral  normal   Modified Ashworth Scale (MAS) Bohannon and Smith, 1987   0, No increase in tone    Range of Motion:   PROM: WFL   Contractures: present        AROM:     Shoulder: RIGHT LEFT   Flexion full range  full range    Extension full range  full range    Abduction full range  full range    Adduction full range  full range  Internal Rotation (90 deg) full range  full range    External Rotation (90 deg) full range  full range        Elbow: RIGHT LEFT   Flexion full range  full range    Extension full range  full range        Forearm: RIGHT LEFT   Supination full range  full range    Pronation full range  full range        Wrist: RIGHT LEFT   Flexion full range  full range    Extension full range  full range        Hand: RIGHT LEFT   Flexion full range  full range    Extension full range  full range          Strength:   RIGHT LEFT   Shoulder 5/5: normal (full range of motion against maximal resistance) 5/5: normal (full range of motion against maximal resistance)   Elbow 5/5: normal (full range of motion against maximal resistance) 5/5: normal (full range of motion against maximal resistance)   Wrist  5/5: normal (full range of motion against maximal resistance) 5/5: normal (full range of motion against maximal resistance)     UE Orthotics / Splints:  []  yes  [x]  no              FUEL Level: The Functional Upper Extremity Levels (FUEL) is a new classification tool to assess a person's upper-extremity functional and physical performance after sustaining a stroke.   Fully functional: involved UE has returned to complete function. Able to use as dominant UE if premorbidly dominant. Strength, grasp, pinch, and coordination measurements are all normal.         Sensation:       Right Left   Light Touch impaired impaired   Hot/Cold Impaired  Impaired     Proprioception intact intact       Balance/ Postural Control:    Sitting: WFL   Standing: WFL    Functional mobility: Currently using RW within the home and for community level distances. Pt reporting PT recently recommended transitioning away from RW. Is concerned able LE endurance    Objective Findings and Measurements     Dynamometer Gross Grasp is a quantitative and objective measure of isometric muscular strength of the hand and forearm. This tool can be used as a pre-post measuring assessment.  Position the middle bones of your fingers so that they rest on the forward end of the dynamometer grip - the part you'll be gripping.   Hold your arm at the side of your body with your hand forward, elbow at a 90 degree angle, elbow about 2 inches away from your body.  Do a couple 'practice' grips. Get a feel for gripping the dynamometer.   Tense your hand as hard as possible for a few (3 - 5) seconds.   Don't move any other parts of your body, don't help with other muscles, etc.   Do 3 trials, waiting about a minute to recover between each one. Take average of 3 trials.    Right - 64 lbs vs norm of 75+ years:  80,80 pounds  Left - 62 lbs vs norm of 75+ years:  75,79 pounds    Nine Hole Peg Test   Instructions:   1. Board should be placed in front on pt at his/her midline, with the container holding the pegs oriented towards the hand bring tested  2. Take pegs from  a container, one by one, and place them into the holes on the board (as quickly as possible)  3. Remove pegs, one by one, and place back in container  4. Practice round first for each hand; then dominant hand, followed by non-dominant hand.  A. Pick up the pegs one at a time, using your right (or left) hand only and put them into the hole sin any order until the holes are filled. Then remove the pegs one at a time and return them to the container. Stabilize the peg board with your left (or right) hand. This is a practice test. See how fast you can put all  the pegs in and take them out again. Are you ready? Go!  B. After the pt performed the practice trial, This will be the actual test. The instructions are the same. Work as quickly as you can. Are you ready? Go! While the patient is performing the test say Faster. When the pt places the last peg on the board, instruct the pt  out again...faster  5. Hand not being evaluated can hold the edge of the board for stability  6. If a peg drops on the table, pt can retrieve it; if drops on ground, therapist should retrieve for the pt  7. Scores: time taken to complete (seconds). Can also score by the number of pegs placed in 50 or 100 seconds (=pegs/second)  8. Start time once the pt touches the first peg, stop timer once the last peg hits the counter  Minimum Detectable Change (specifically for stroke population)= 32.8 seconds     Right 47 sec. vs norm 71+ years: average 25.79 seconds  Left 43 sec. vs norm 71+ years: average 25.95 seconds      Cognition Status:   [x]  functional for basic conversation    []  impaired attention    []  impaired memory   []  impaired awareness/impaired insight    []  unable to assess due to communication deficits    [x]  further assessment needed within context of functional and therapeutic interventions     Visual Perception:    []  N/T  []  WFL   []  Impaired   []  Absent  [x]  Wears glasses  []  Visual field deficit    []  diplopia    []  other    Learning Needs Assessment:  [x]  Patient is able to communicate with therapist and verbalize understanding of directions/instructions  []  Patient is unable to communicate with therapist and/or verbalize understanding of directions/understanding due to the following barriers to learning:      []  Reading     []  Language     []  Visual     []  Hearing     []  Other:  Is an interpreter required? [] Yes   [x]  No    Assessment/Problem List: Patient presents as outlined below . Patient would benefit from skilled OT to address the indicated deficits.     Evaluation  Code Matrix  History:    Number of personal factors and comorbidities (includes relevant medical complications, complicating behaviors/beliefs, communication issues, mentation, etc):    [] 0      97165       [x] 1-2     97166          [] >=3      97167     Performance Deficits:   decreased ambulation/ability to do stairs  dependent with HEP  decreased balance/ coordinationUE function, ADLs, balance, functional mobility, and activity tolerance    [] 1-2 elements 97165            [  x]3 elements 97166        [] >4 elements 97167      Clinical Presentation/Comorbidities  [x]  97165 Stable (no comorbidities)    []  97166 Evolving (Clinical decision making is of moderate analytic complexity; has comorbidities)   []  97167 Unstable (Clinical decision making is of high analytic complexity; has comorbities)   Evaluation Code  []  Low Complexity 97165  [x]  Moderate Complexity 97166      []  High Complexity 97167            Rehabilitation Potential: Based upon this therapist's assessment, Matthew [name] has the following rehabilitation potential for OT goals stated in this evaluation: good    Occupational Therapy Plan of Care:  I/ADL retriaing, functional endurance training required for household distance, [facility] training. 2x per week for 4 weeks   Patient/Family Goals: I want to be able to return to tennis and driving    Therapy Goals:     Short Term Goals to be met within 4 sessions INITIAL STATUS REASSESSMENT:  DATE:   1. Patient will demonstrate independence with progressive HEP for [facility] and energy conservation strategies to address functional deficits with continued progress after discharge.     Initiated hand coordination HEP during intitial eval, would benefit from reinforcement []  PROGRESS MADE/        CONTINUE GOAL  []  GOAL MET  []  DC GOAL   Long Term Goals to be met within 8 sessions:  INITIAL STATUS REASSESSMENT:   Pt will be educated on AE for dressing to maximize IND and improve QOL.   Initiated education on elastic shoe  laces and button hook at initial eval [x]  PROGRESS MADE/        CONTINUE GOAL  []  GOAL MET  []  DC GOAL     2. Pt will tolerate >20 mins of functional mobility while completing simple IADL task to demonstrate improve endurance  Reports fatigue, per patient report is transitioning out of RW []  PROGRESS MADE/        CONTINUE GOAL  []  GOAL MET  []  DC GOAL     3. Patient will decrease time on 9 hole peg test by at least 5 seconds with the B UE in order to increase independence with clothing management.     Right hand: 47 seconds    Left hand: 43 seconds []  PROGRESS MADE/        CONTINUE GOAL  []  GOAL MET  []  DC GOAL     4. Pt will be educated on accessibility features to improve usage. Limited by neuropathy, states texting is very frustrating, decreased accuracy   []  PROGRESS MADE/        CONTINUE GOAL  []  GOAL MET  []  DC GOAL     5. Patient will complete cognitive and visual perceptual assessments in order to further assess safety with community mobility.     Is moving, would like to complete screening processes for returned to driving prior to upcoming move at end of november []  PROGRESS MADE/        CONTINUE GOAL  []  GOAL MET  []  DC GOAL     Plan:    Treatment to Include: therapeutic exercise, therapeutic activity, moist heat/cold pack, manual therapy, self-care/home management, and Estim/FC/TENS  Patient/Family Agree to Treatment: Yes     Treatment Diagnosis:   1. Impaired instrumental activities of daily living (IADL)        2. Generalized weakness        3. Cognitive  impairment              []  SKILLED TREATMENT PROVIDED THIS SESSION:     CPT code Skilled interventions, Assessment, and Education Timed code treatment minutes   therapeutic activity Reviewed driving ans hand controls information   20 min   self-care/home management  10 min   therapeutic exercise   min        Start: 1331  Stop:  1431 30 min mod complexity evaluation  Total treatment time: 42        Education provided this date: Patient/family received  education on the role and  purpose of therapy, participated in the development of the POC and verbalized and understanding and agreement of POC including goals and interventions. Reviewed scheduling for appointments.        Education to: [x]  Patient [x]  Significant other  Matthew [name], wife) []  Caregiver []  other   Method: [x]  Explanation [x]  Demonstration   [x]  Handouts []  other   Response: [x]  Verbalized understanding []  Demonstrated understanding []  Needs reinforcement []  other     Timed Code Treatment Minutes: 15 minutes  Total Treatment Time: 45 minutes      Therapist Signature: Tressia Miners, OTR/L  Date: 01/26/2022      Physical Certification:  I certify that the above patient is under my care and requires the above services.  These professional services are to be provided from an established plan related to the diagnosis and reviewed by me every 90 days.      Physician's Name:  ____________________________      Physician's Signature:  __________________________ Date:   ___________

## 2022-01-26 NOTE — Unmapped (Cosign Needed)
Physical Therapy Initial Evaluation and Plan of Care  Name: Matthew [name]     Date of Birth: [birth year]      MRN: [id number]    Date of evaluation: 01/26/2022  Referring Physician: Edwyna Ready, MD  [address]  [place],  [place] [address] Phone: [phone] Fax: [phone]  Specific Order: Evaluate and treat   Date of Onset: 12/30/2021  Primary Medical Diagnosis: V45.89 (ICD-9-CM) - Z98.890 (ICD-10-CM) - H/O excision of lamina of cervical vertebra for decompression of spinal cord  Insurance: Payor: MEDICARE / Plan: MEDICARE A AND B / Product Type: Medicare /     Subjective/History:   History of Current Problem/Reason for Referral: Matthew [name] is a 76 y.o. male who presents today with chief complaint of impaired balance and mobility s/p cervical decompression surgery (3 surgeries in total starting in September 2023). Pt arrives to session ambulatory with hurrycane. Pt reports mobility is slightly better compared to previous POC. He has been riding his exercise bike. Pt only uses the hurrycane out in the community. Denies falls and states strength is improving. Pt has been doing some light weights and using ankle weights on bike. Pt plans to move to [place] at the end of next week. Leaves on 02/03/22. Pt has been driving. Pt's wife has found therapy clinic in [place].     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    Environmental allergies      Hearing loss     left (since age 64), right more recent    High cholesterol     Hypertension     Neck symptom     Trouble turning head to the left     Numbness     Hands and Feet    Sleep apnea     Sleep disorder     Tingling     Hands and Feet    Weakness    ,   Past Surgical History:   Procedure Laterality Date    ACHILLES TENDON REPAIR  01/31/2002    left     APPENDECTOMY      CERVICAL FUSION N/A 12/30/2021    Procedure: C3-C5 POSTERIOR CERVICAL LAMINECTOMY AND FUSION;  Surgeon: Denyse Dago, MD;  Location: [facility] OR;  Service: Neurosurgery;  Laterality: N/A;    HERNIA REPAIR  02/01/1951    INCISION AND DRAINAGE POSTERIOR LUMBAR SPINE N/A 10/30/2021    Procedure: possible lumbar drain;  Surgeon: Tempie Hoist, MD;  Location: [facility] OR;  Service: Neurosurgery;  Laterality: N/A;    IRRIGATION AND DEBRIDEMENT SPINE N/A 10/30/2021    Procedure: CSF leak repair, exploration of the neck;  Surgeon: Tempie Hoist, MD;  Location: [facility] OR;  Service: Neurosurgery;  Laterality: N/A;    KNEE SURGERY Left 1976    SPINE SURGERY      TONSILLECTOMY      WISDOM TOOTH EXTRACTION  Medications: He has a current medication list which includes the following prescription(s): acetaminophen, albuterol, alpha lipoic acid, amlodipine, aspirin, azelastine, bimatoprost, brimonidine, cetirizine, doxepin, duloxetine, finasteride, fluticasone propionate, lidocaine, melatonin, methocarbamol, naloxone, oxycodone, pregabalin, rosuvastatin, senna-docusate, senna-docusate, tadalafil, timolol, and tizanidine.    Per HPI/referring provider:  Reason for Admission      Matthew [name] is a 76 y.o. 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 He is now s/p C3-5 posterior cervical decompression and onlay fusion      Hospital Course           Active Hospital Problems     Diagnosis Date Noted    H/O excision of lamina of cervical vertebra for decompression of spinal cord [Z98.890] 12/31/2021       Resolved Hospital Problems   No resolved problems to display.      Patient presented on day of surgery with stable symptoms and exam findings, and was taken to the OR for C3-C5 posterior decompression with onlay fusion.  They tolerated the procedure well and awoke with expected incisional pain.  Following an appropriate recovery period in the post-anesthesia care unit, the patient was admitted for further monitoring. Their hospital stay consisted of the following:      Post-op Imaging was completed and reviewed, demonstrating intact hardware   Drain removed on POD1  No brace is required  PT/OT evaluated, recommending: out patient PT/OT     After a period of stable neuro exam and vital signs it was determined the patient was ready to be discharged to home. At time of discharge the patient had good oral intake and was ambulating well. Discharge medications and wound care instructions were reviewed with the patient and their family. Follow up appointments have been scheduled.     Objective:   Vital Signs: BP 112/70 mmHg HR 69  bpm Other:    Mental Status: alert, appears stated age, and cooperative  Learning Assessment:   [x]  Patient is able to communicate with therapist and verbalize understanding of directions/instructions   []  Patient has difficulty effectively communicating with therapist and/or verbalize understanding of directions/instructions due to the following barriers to learning:   []   Reading  [] Language  [] Visual  [] Hearing  []  Other:  Is an interpreter required? [] Yes [x]  No  Primary Language:  Vision: glasses    Pain: Denies pain  Sensation:  impaired ; peripheral neuropathy bilaterally    Skin integrity: WFL  Muscle Tone and Modified  [place]: Restless leg syndrome     LE Strength: within functional limits or tested as follows:  Hip flexion:  Left: 5/5 Right: 5/5  Hip abduction:  Left: 5/5 Right: 5/5  Hip extension:  Left:5/5 Right: 5/5  Knee extension: Left: 5/5 Right: 5/5  Knee flexion:  Left: 5/5 Right: 5/5  Ankle dorsiflexion: Left: 5/5 Right: 5/5  Ankle plantarflexion: Left: 5/5 Right: 5/5  Ankle eversion: Left: 5/5 Right: 5/5  Ankle inversion: Left: 5/5 Right: 5/5  LE Range of Motion: within functional limits or tested as follows:    Flexibility:   Back Range of Motion:  diminished range of motion     Neck Range of Motion diminished range of motion  Posture: Forward head, rounded shoulders, decreased overall curvature of the spine    Transfers: Independent - able to perform STS without UE support  Bed Mobility: Independent  Wheelchair Mobility: NA    Balance:   - Sitting WFL  - Standing impaired dynamic  Coordination:  no dysmetria or tremor noted    Gait: Limited arm swing and trunk rotation, intermittent decreased R step length; independent with and without hurrycane. With hurrycane, uses device only intermittently, often dragging it behind him.   Stairs: Reciprocal with handrails, independent; CGA and LOB without handrails    Specialized Tests:         01/26/2022   SIX MINUTE WALK TEST   Distance (ft.) 1412   Level of Assistance Independent   Assistive device None         01/26/2022   Funtional Gait Assessment   Gait Level Surface Normal   Gait with Horizontal Head Turns Mild Impairment   Gait and Pivot Turn Normal   Gait with Narrow Base of Support Moderate Impairment   Ambulating Backwards Mild Impairment   Change in Gait Speed Mild Impairment   Gait with Vertical Head Turns Normal   Step Over Obstacle Mild Impairment   Gait with Closed Eyes Moderate Impairment   Steps Mild Impairment   Total Score out of 30 21      Additional Assessment Information: Treatment provided (Neuromuscular Reeducation): Pt was educated on outcome  measure scores compared to previous POC. Discussed fall risk associated with FGA score. Discussed recovery s/p SCI, neuropathy, and myelopathy. Discussed goals for POC in [place] as well as possibility of eventual return to sport. Discussed importance of balance system integration and safety in dynamic environments.      Assessment:   Assessment/Problem List: Patient presents with gait deficits, balance deficits, postural deficits, and sensory deficits contributing to functional limitations including impaired community mobility and increased fall risk. Pt's overall LE strength is WFL. Pt ambulated 300' further on at 1412' compared to distance prior to third surgery. However, distance is below age matched norms of >24'. Pt's score on FGA slightly improved from 20/30 to 21/30. However, score <23/30 increases fall risk and is below age matched norms of 105/30. Patient would benefit from skilled PT to address the aforementioned deficits. However, with pt's imminent move to [place], this PT will discharge and recommends pt establish care with new physical therapist in [place]. Pt is in agreement with plan, and all questions/concerns have been addressed at this time. Pt will be discharged from PT at this time.      Evaluation Code Matrix  History:  Number of personal factors and comorbidities (includes relevant medical complications, complicating behaviors/beliefs, communication issues, mentation, etc):  [] 0  97161        [] 1-2 97162               [x] >=3 [id number]   Examination Elements: includes number of activity/ participation limitations, affected body structure (s), and affected body functions (example, ROM, strength, coordination, tone )  [] 1-2 elements 97161             [] 3 elements 97162        [x] =>4 elements 97163   Clinical Presentation  []  97161 Stable (unchanging, uncomplicated, predicted rate of recovery)    [x]  97162 Evolving (changing clinical characteristics (improving/regressing,)   [] 97163 Unstable  (unpredictable characteristics,(fluctuating pain, tone, function, BP response, etc)   Evaluation Code ( select the lowest code issued on the above components)  []   Low Complexity 97161  [x]  Moderate Complexity 97162      []  High Complexity 97163         Plan Of Care:       Treatment to Include: Neuromuscular Re-education: 97112 and PT Moderate Complexity Eval: 97162     Patient/Family Agree to Treatment: Yes      Treatment Diagnosis:   1. H/O excision of lamina of cervical vertebra for decompression of spinal cord              Therapy Frequency/Duration: Discharged from skilled PT at this time. Recommend pt establishing with new practice in [place].       Certification Period: 01/26/2022 - 04/26/2022      Therapist Signature: Loraine Maple, PT, DPT, NCS [phone]   Date: 01/26/2022    Physician Certification   I certify that the above patient is under my care an requires the above services. These professional services are to be provided from an established plan, related to the diagnosis and reviewed by me every 90 days.     Additional comments/revisions:     Physician Name: Edwyna Ready, MD    Signature_______________________________________ Date: _______

## 2022-01-27 ENCOUNTER — Ambulatory Visit: Payer: MEDICARE

## 2022-02-09 NOTE — Unmapped (Signed)
Faxed pt wife notified

## 2022-02-09 NOTE — Unmapped (Signed)
URGENT REQUEST   Patient's wife Jodi Geralds is requesting the PT order to be faxed today ASAP for his appointment on 02/14/2022      [name] states this appointment will be cancelled within the hour if this order has not been faxed     [place] : Judson Roch in the [place] location PT Dept   [facility] -[phone]    Please call Clarene Critchley when this has been faxed   [phone]

## 2022-02-15 ENCOUNTER — Encounter

## 2022-03-22 IMAGING — MR MRI CERVICAL SPINE WITHOUT CONTRAST
7 of 10 series · 13 of 48 positions shown · IV contrast (gadolinium)
Comparison: Cervical spine x-ray December 30, 2021, MRI cervical spine December 01, 2021.

________________________________________________________________________________________________ 
MRI CERVICAL SPINE WITHOUT CONTRAST, 03/22/2022 [DATE]: 
CLINICAL INDICATION: Limited range of motion. 3 prior cervical surgeries, most 
recently December 2021.
TECHNIQUE: Multiplanar, multiecho position MR images of the cervical spine were 
performed without intravenous gadolinium enhancement. Patient was scanned on a 
1.5T magnet.

[Series 101: survey · axial · 10.0mm · 1.25mm/px · z∈[-6,+200]mm · 2 of 10 slices shown]
[im 1/10]
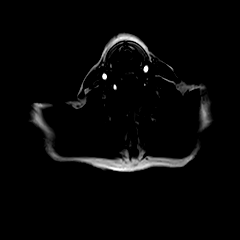
[im 10/10]
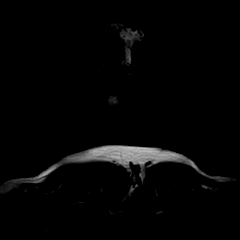

[Series 201: t2w_cor-surv · coronal · 5.0mm · 0.69mm/px · 2 of 7 slices shown]
[im 1/7]
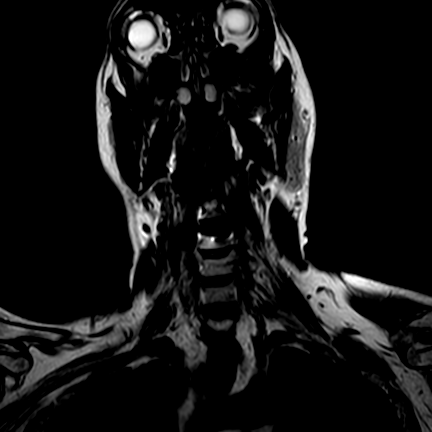
[im 7/7]
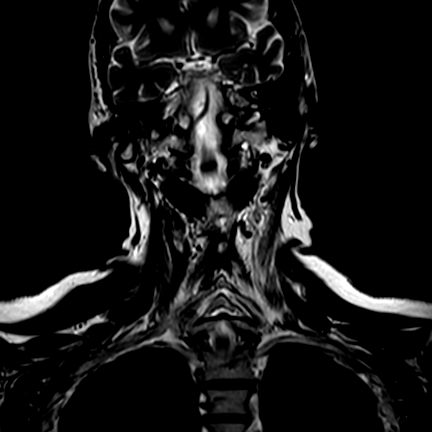

[Series 301: T1 · sagittal · 3.0mm · 0.39mm/px · 1 of 15 slices shown]
[im 1/15]
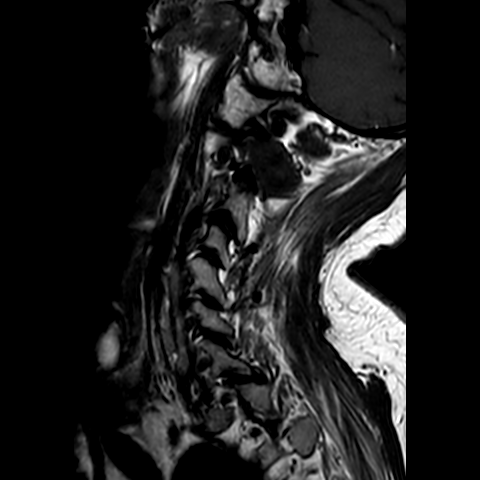

[Series 402: (id)_mdixon_tse · sagittal · 3.0mm · 0.35mm/px · 1 of 15 slices shown]
[im 1/15]
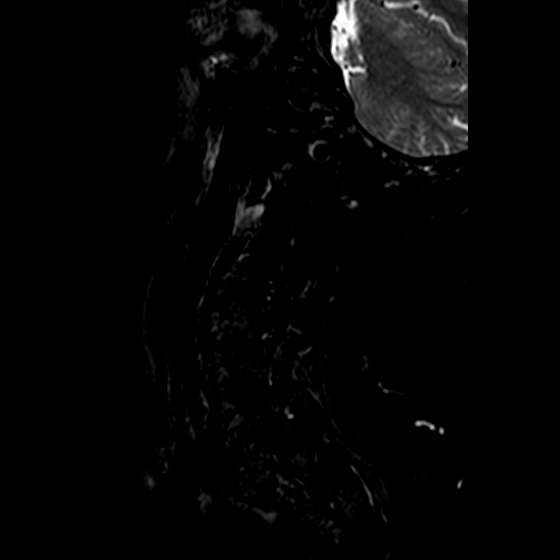

[Series 403: st2w_mdixon_tse · sagittal · 3.0mm · 0.35mm/px · 1 of 15 slices shown]
[im 1/15]
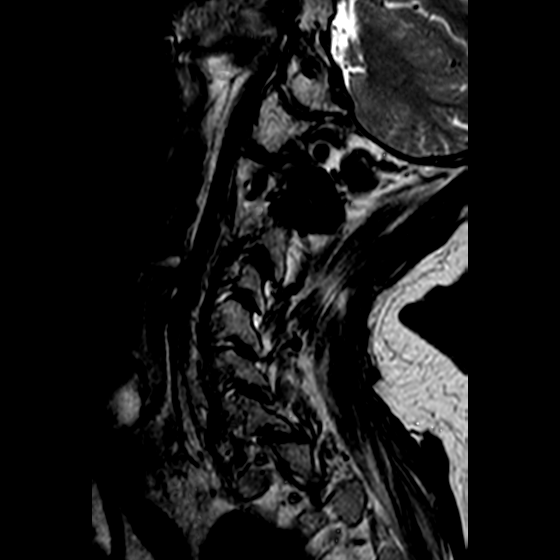

[Series 501: T2-star · axial · 3.0mm · 0.62mm/px · z∈[-84,+14]mm · 3 of 34 slices shown]
[im 1/34]
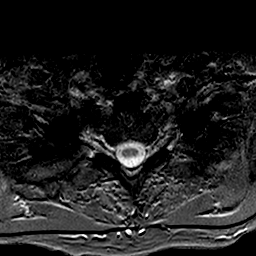
[im 17/34]
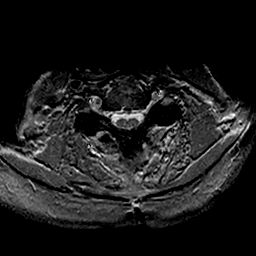
[im 34/34]
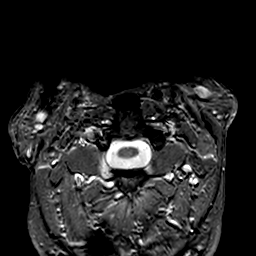

[Series 601: T2 · axial · 3.0mm · 0.31mm/px · z∈[-84,+14]mm · 3 of 34 slices shown]
[im 1/34]
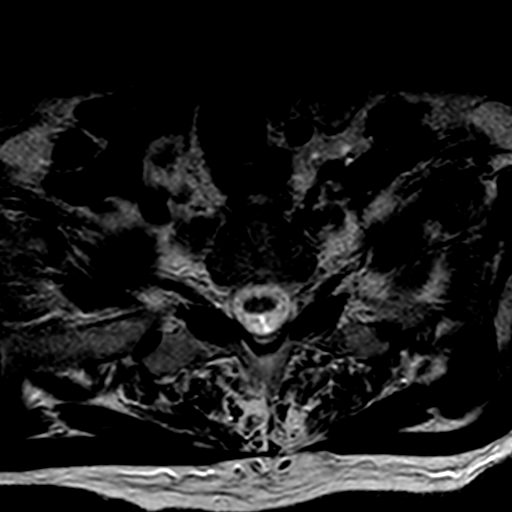
[im 17/34]
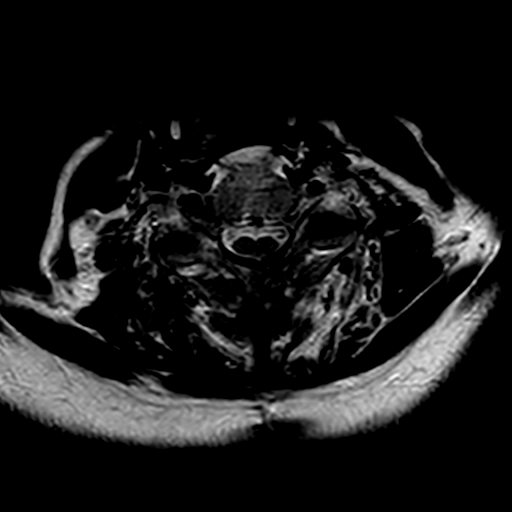
[im 34/34]
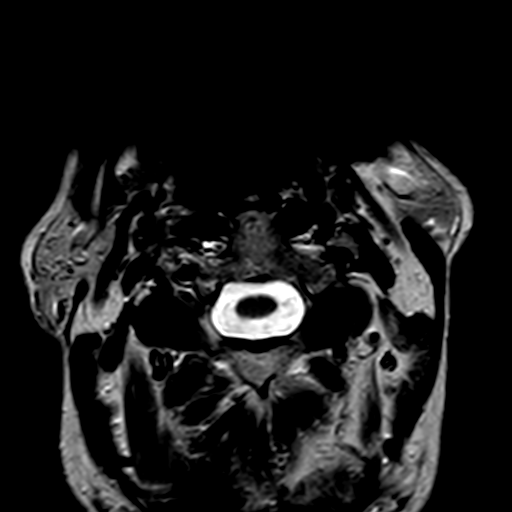

[13 of 48 positions shown; findings below may reference images not displayed]

FINDINGS: Redemonstration of ACDF C3-C4. Laminectomy C3-C4 and C4-C5. Laminectomy has 
occurred since the previous MRI study. Fluid collection in the laminectomy bed 
measures 1.4 x 1.4 cm in axial dimension and 2.2 cm in craniocaudal dimension. 
No definite communication of the thecal sac. 
-------------------------------------------------------------------------------- 
----------------- 
GENERAL: 
ALIGNMENT: Normal coronal alignment. There is grade 1 retrolisthesis C3 on C4 
which is stable. Otherwise there is anatomic sagittal alignment. 
VERTEBRAL BODY HEIGHT: Normal.  
MARROW SIGNAL: No focal suspect signal abnormality. 
CORD SIGNAL: Abnormal cord signal is present at the C3-C4 level, to the right of 
midline and involving the dorsal cord. This is associated with cord thinning in 
this region and consistent with chronic-appearing cord myelomalacia.  
ADDITIONAL FINDINGS: None. 
-------------------------------------------------------------------------------- 
---------------- 
SEGMENTAL: 
CRANIOCERVICAL JUNCTION: No significant stenosis. Mild pannus posterior to the 
tip of the dens. Stable. 
C2-C3: Normal disc height. Canal and foramina are patent. Facet arthropathy. 
Mild posterior ligamentous hypertrophy. Stable. 
C3-C4: Fusion with prominent low signal within the ventral epidural space, which 
may reflect ossification of the posterior longitudinal ligament in this region, 
or simply bridging osteophyte. It abuts and minimally indents the ventral cord 
margin. There is ventral cord flattening and cord thinning and abnormal cord 
signal at this level. The canal is mild to moderately narrowed. Moderate right 
and severe left foraminal narrowing. Canal demonstrates improved patency since 
prior MRI study. Foramina stable. 
C4-C5: Moderate loss of disc height posteriorly. Central disc osteophyte 
protrusion. Canal is patent. Posterior decompression. Moderate right and left 
foraminal narrowing. Facet arthropathy. Canal demonstrate better patency in the 
interval. Foramina stable.. 
C5-C6: Mild loss of disc height. There is early osseous bridging across the 
posterior aspect of the disc space towards the left. Canal and right foramina 
patent. Mild to moderate left foraminal narrowing. Facet arthropathy. Stable. 
C6-C7: Normal disc height. Posterior ligamentous hypertrophy. Canal patent. 
Mild-to-moderate bilateral foraminal narrowing. Normal facets. No significant 
interval change. 
C7-T1: Normal disc height. Evidence of a small size central disc extrusion with 
disc material extending inferiorly. Canal and foramina are patent. Normal 
facets. Stable 
Visualized upper thoracic segments unremarkable. 
-------------------------------------------------------------------------------- 
---------------
IMPRESSION: ACDF C3-C4. Laminectomy C3-C4 and C4-C5 has occurred since previous MRI study. 
Fluid collection in the laminectomy bed without definite communication to the 
thecal sac. 
Abnormal cord signal with cord thinning, findings consistent with cord 
myelomalacia C3-C4 level. The abnormal cord signal as developed since the 
previous MRI study. Mild to moderate canal stenosis C3-C4, although there is 
improved patency since the prior MRI study. 
Multilevel neural foraminal narrowing detailed above.

## 2022-03-31 ENCOUNTER — Inpatient Hospital Stay: Admit: 2022-03-31

## 2022-04-08 NOTE — Telephone Encounter (Signed)
Referral placed for Dr. Lannette Donath. Will ask staff to fax requested records.     Carley Hammed, MD  Movement Disorders Fellow [facility]  1:56 PM 04/08/2022

## 2022-04-14 IMAGING — CT CT CERVICAL SPINE WITHOUT CONTRAST
3 of 5 series · 11 of 33 positions shown, 13 images · non-contrast
Comparison: Cervical MRI March 22, 2022

________________________________________________________________________________________________ 
******** ADDENDUM #1 ********/n 
No osseous fusion at C3-4 and up to 0.1 cm zone of lucency about the C3 screw, 
worrisome for hardware loosening (sequence 11 image 40). 
CT CERVICAL SPINE WITHOUT CONTRAST, 04/14/2022 [DATE]: 
CLINICAL INDICATION: Breakdown of internal fixation device, evaluate for 
stenosis 
A search for DICOM formatted images was conducted for prior CT imaging studies 
completed at a non-affiliated media free facility.
TECHNIQUE: The cervical spine was scanned from the skull base through T1 
vertebra without contrast on a high-resolution CT scanner using dose reduction 
techniques. Routing MPR reconstructions were performed. Count of known CT and 
Cardiac Nuclear Medicine studies performed in the previous 12 months = 0.

[Series 4: bone (person_name) · axial · 0.24mm/px · z∈[-239,-152]mm · 3 of 89 slices shown, 4 images]
[im 23/89  soft-tissue]
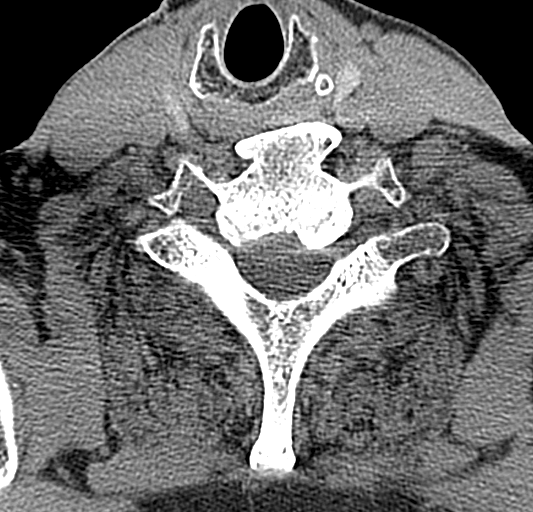
[im 23/89  bone]
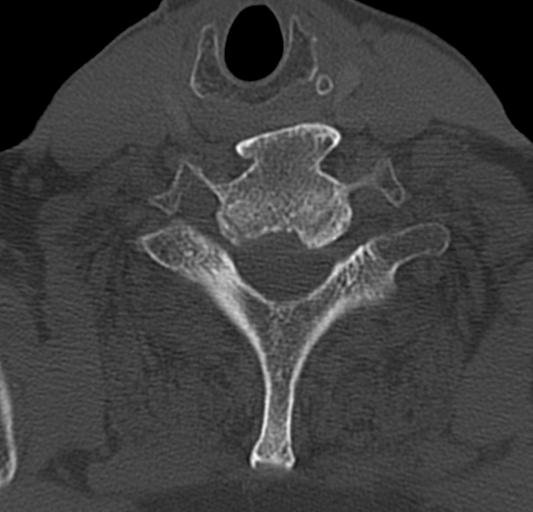
[im 45/89  bone]
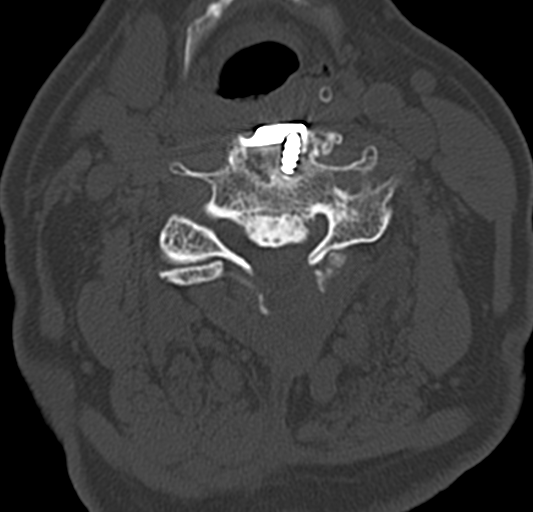
[im 67/89  bone]
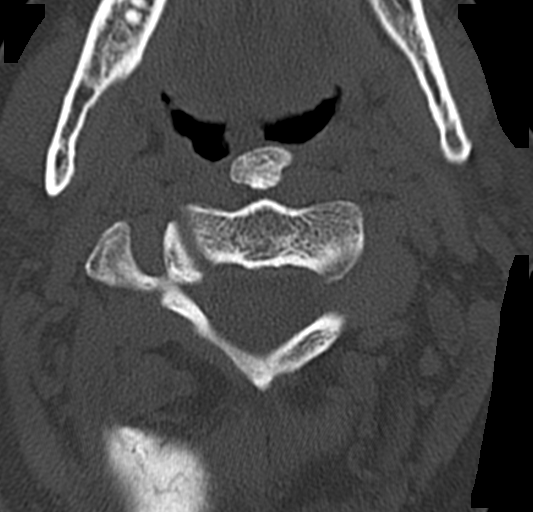

[Series 6: (person_name) · coronal · 0.20mm/px · 3 of 53 slices shown]
[im 11/53  bone]
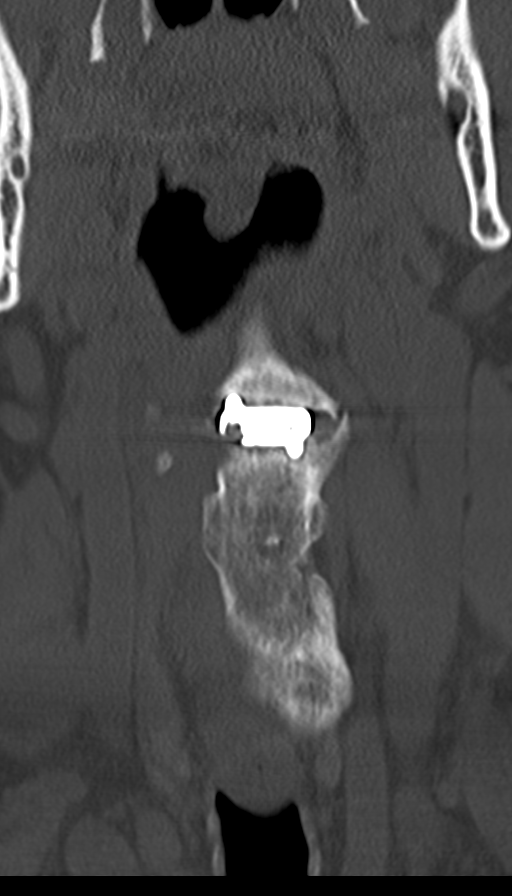
[im 21/53  bone]
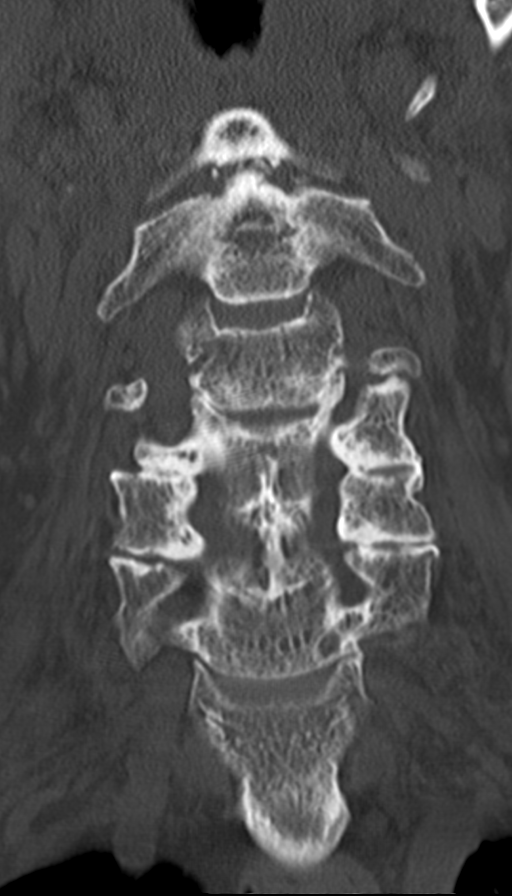
[im 32/53  bone]
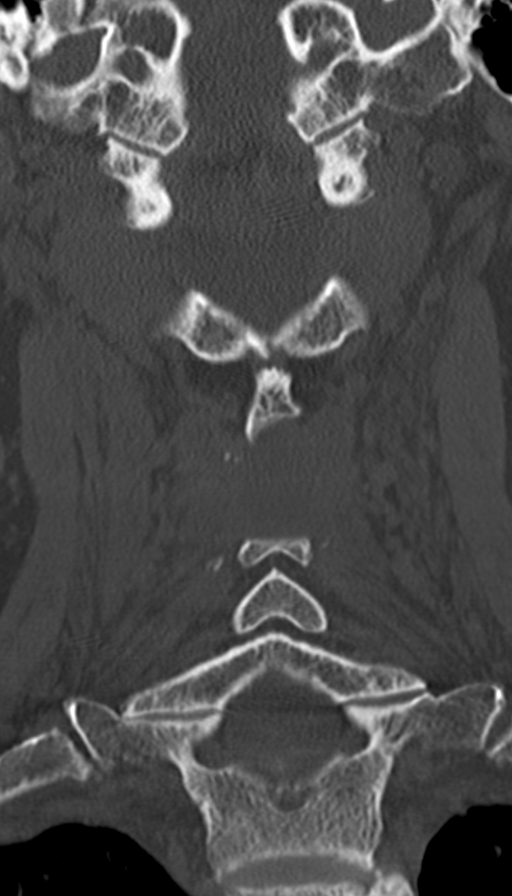

[Series 8: sag st · sagittal · 0.28mm/px · 5 of 56 slices shown, 6 images]
[im 19/56  bone]
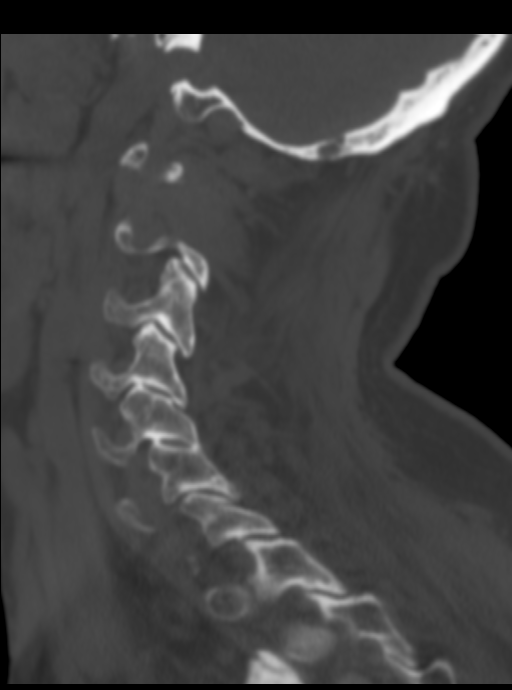
[im 23/56  bone]
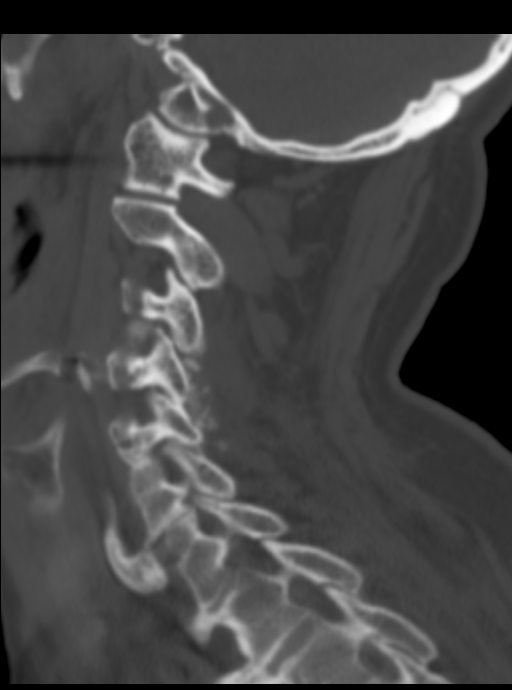
[im 28/56  soft-tissue]
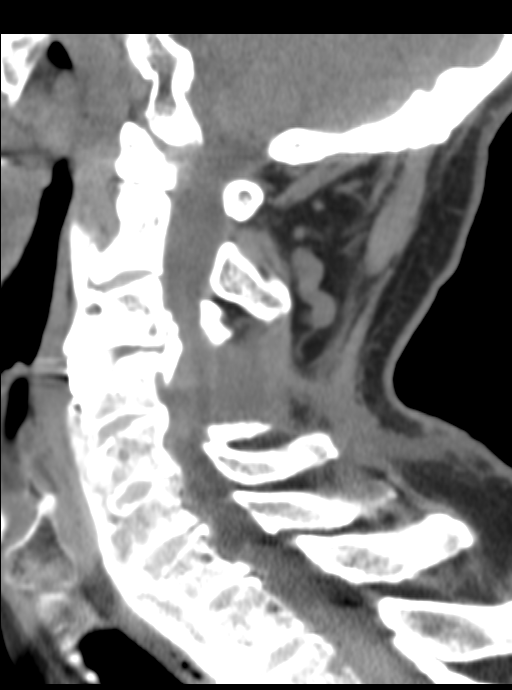
[im 28/56  bone]
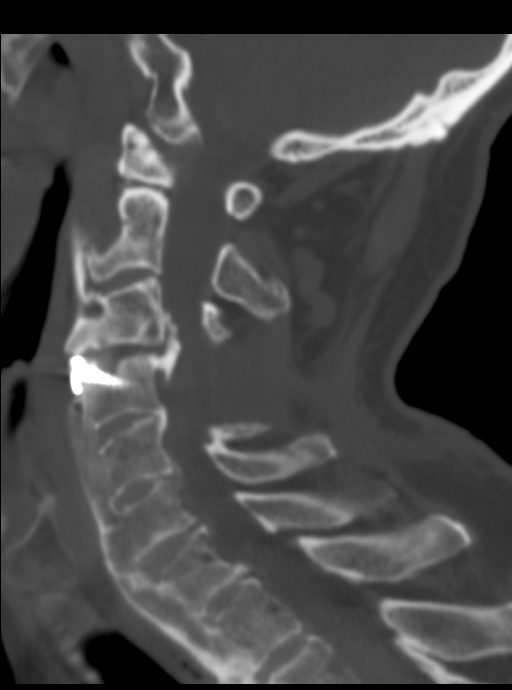
[im 33/56  bone]
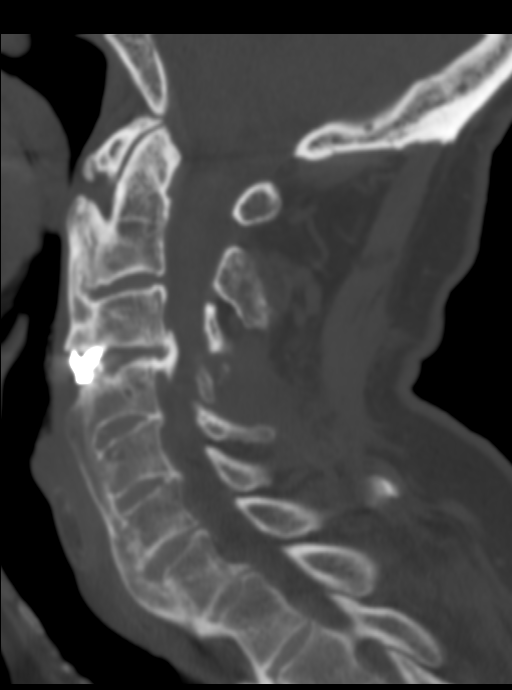
[im 37/56  bone]
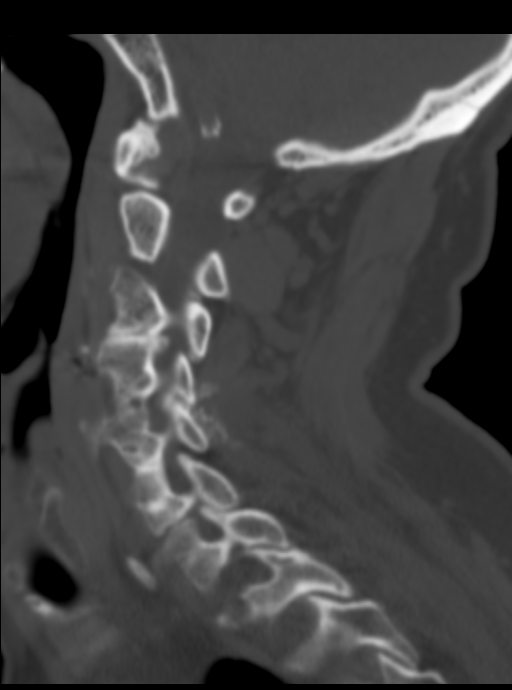

[11 of 33 positions shown; findings below may reference images not displayed]

FINDINGS: Patient is status post C3-4 anterior cervical discectomy with anterior 
hardware fusion. There is ossification of the posterior longitudinal ligament 
extending from C3-4 to C4-5, with AP dimension 5 mm. There has been posterior 
decompression. There is deformity of the cord opposite C3-4 by the calcified 
ligament. This deformity was better demonstrated on the recent MRI. There is 
marked left foraminal stenosis at C3-4 impinging the left C4 nerve root. The 
right foramen shows moderate stenosis, bone window axial image 43. 
At C2-3 the canal and foramina are open. 
At C4-5 calcified posterior ligament appears to contact the ventral cord with 
slight deformity, which was not apparent on the recent MRI. Foramina are open. 
There has been posterior decompression at this level. 
At C5-6 there is disc-osteophyte producing mild deformity of the ventral cord. 
Mid sagittal canal diameter measures 10 mm. Foramina are open. 
At C6-7 there is mild disc-osteophyte not appearing to produce cord deformity. 
Foramina are open. 
At C7-T1 the canal and foramina are open.
IMPRESSION: Status post C3-4 ACDF with anterior anchor hardware. There is ossification of 
the posterior longitudinal ligament extending from C3 through C4 5. There is 
deformity of the ventral cord opposite C3-4. There has been posterior 
decompression at C3-4 and C4-5. There is marked left C3-4 foraminal stenosis 
impinging the left C4 nerve root, and moderate right C3-4 foraminal stenosis. 
Less pronounced degenerative changes elsewhere as described. 
The dens is intact. The craniocervical junction is open. There is anterior 
osteophyte throughout the cervical spine, intraoperative at the C3-4 level by 
hardware. 
No evidence for recent fracture or bone destruction. No cervical scoliosis. 
Arteriosclerosis of the distal right vertebral artery appears to produce at 
least moderate stenosis. If indicated CTA would be useful. 
RADIATION DOSE REDUCTION: All CT scans are performed using radiation dose 
reduction techniques, when applicable.  Technical factors are evaluated and 
adjusted to ensure appropriate moderation of exposure.  Automated dose 
management technology is applied to adjust the radiation doses to minimize 
exposure while achieving diagnostic quality images.

## 2022-04-22 ENCOUNTER — Ambulatory Visit: Payer: MEDICARE | Attending: Neurology

## 2022-04-22 IMAGING — MR MRI THORACIC SPINE WITHOUT CONTRAST
4 of 9 series · 8 of 48 positions shown · IV contrast (gadolinium)
Comparison: None.

________________________________________________________________________________________________ 
MRI THORACIC SPINE WITHOUT CONTRAST, 04/22/2022 [DATE]: 
CLINICAL INDICATION: Spinal stenosis.
TECHNIQUE: Multiplanar, multiecho position MR images of the thoracic spine were 
performed without intravenous gadolinium enhancement. Patient was scanned on a 
1.5T magnet.

[Series 101: t2_sag_count · sagittal · 4.0mm · 0.62mm/px · 2 of 22 slices shown]
[im 1/22]
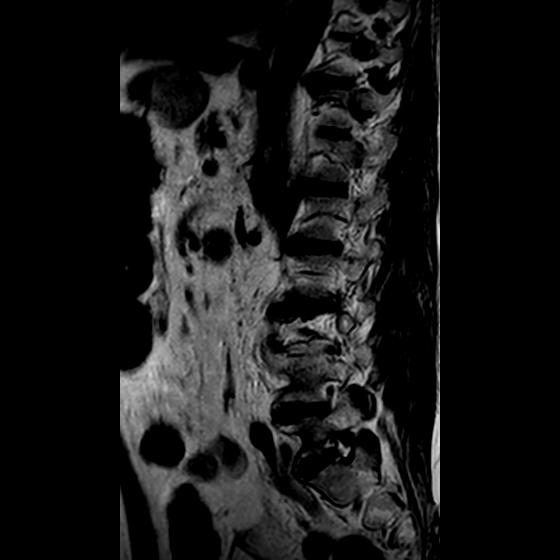
[im 22/22]
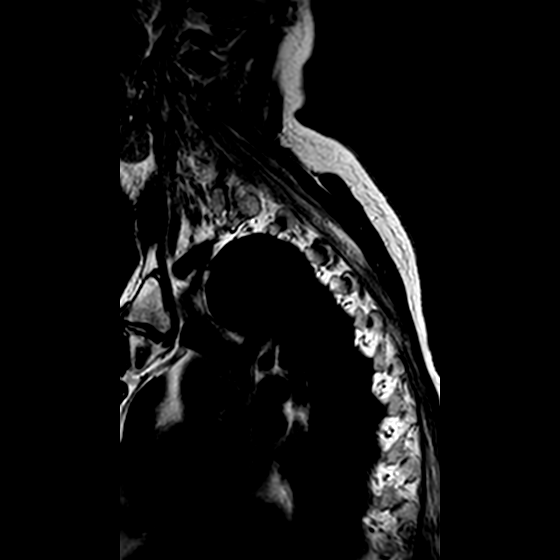

[Series 102: T2 · sagittal · 4.0mm · 0.62mm/px · 2 of 11 slices shown]
[im 1/11]
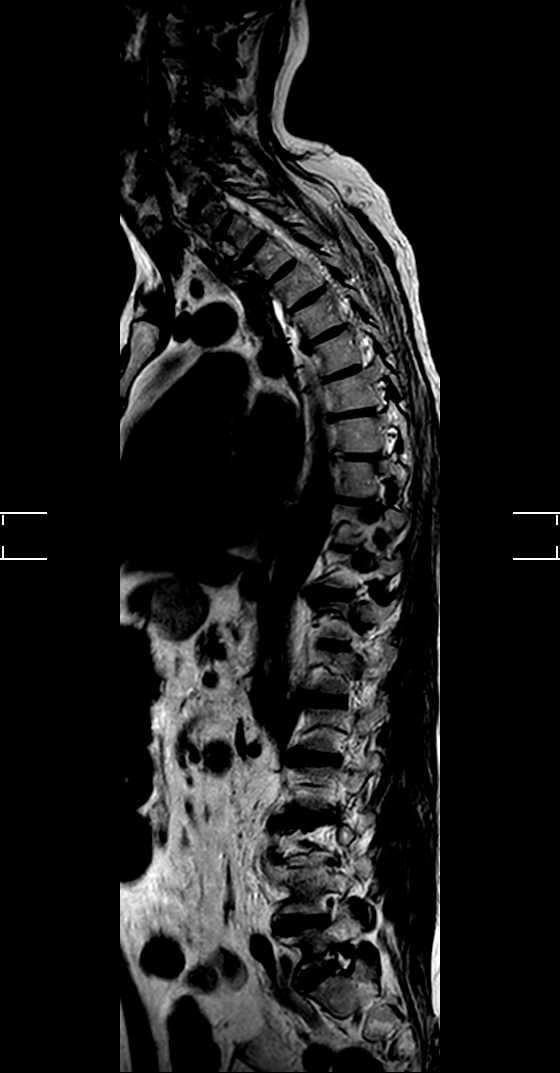
[im 11/11]
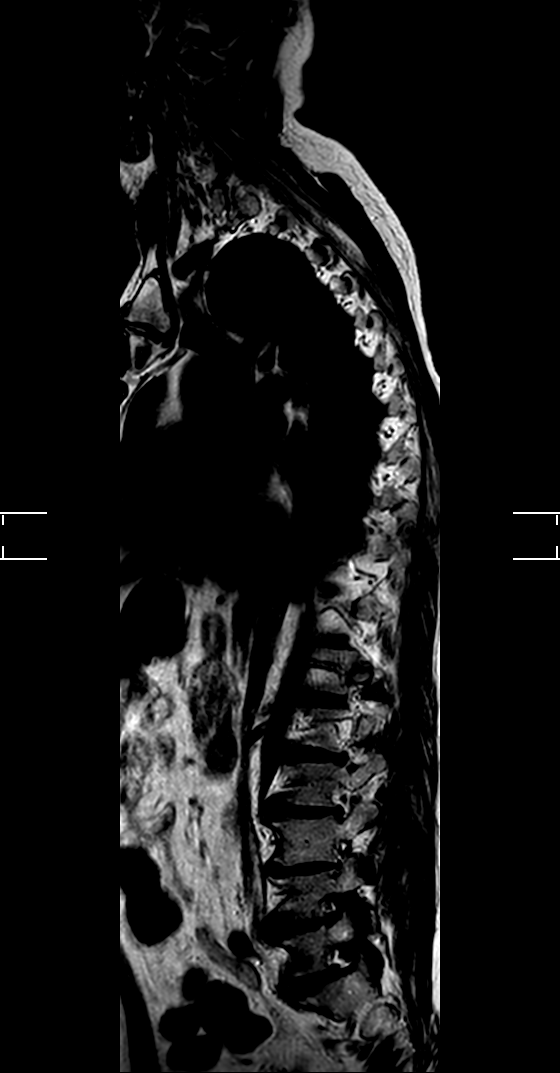

[Series 201: t2_cor_count · coronal · 4.0mm · 0.61mm/px · 2 of 15 slices shown]
[im 1/15]
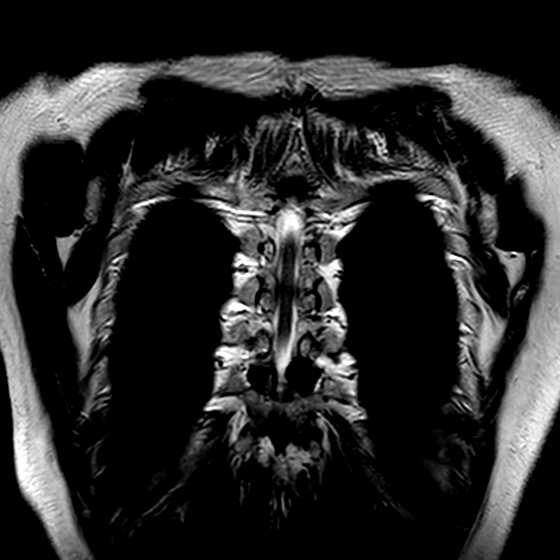
[im 15/15]
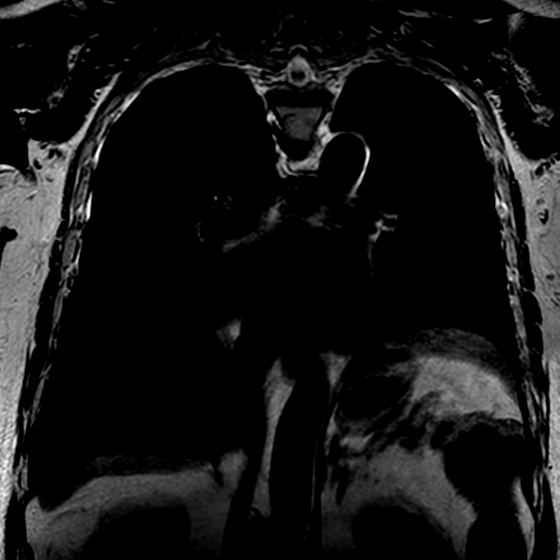

[Series 302: (id)_mdixon_tse · sagittal · 4.0mm · 0.35mm/px · 2 of 17 slices shown]
[im 1/17]
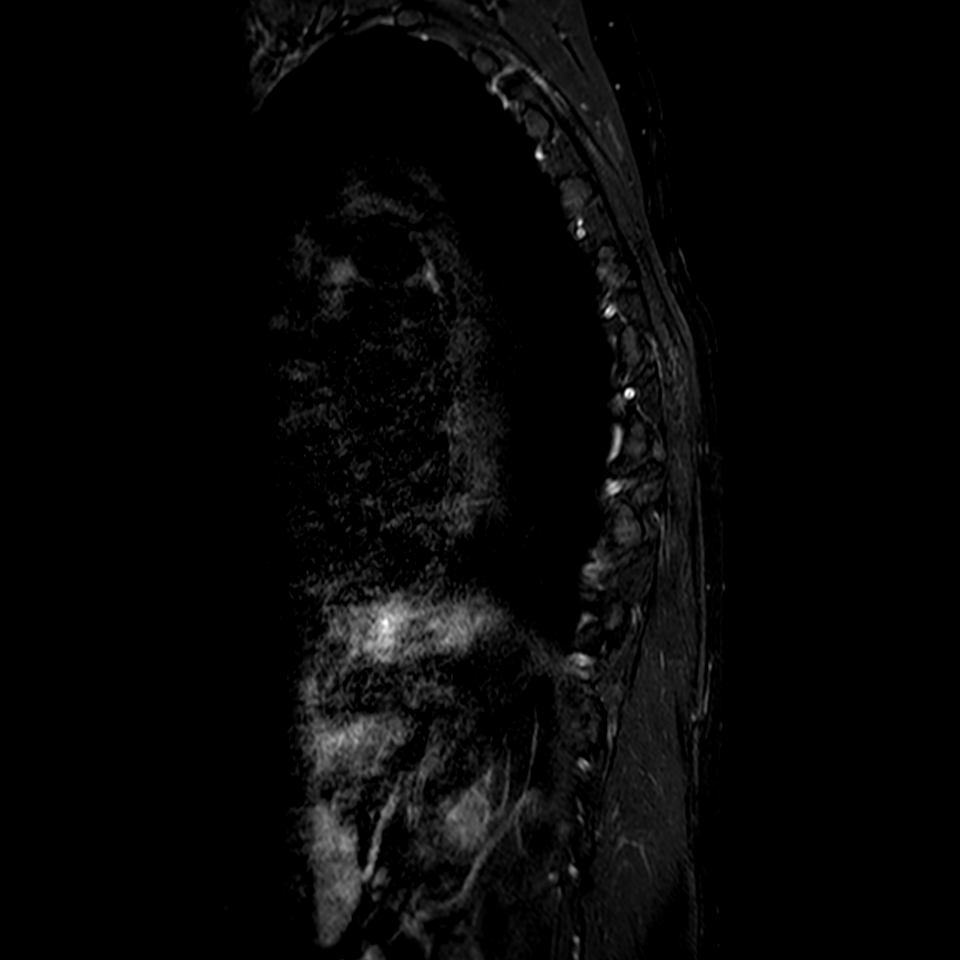
[im 9/17]
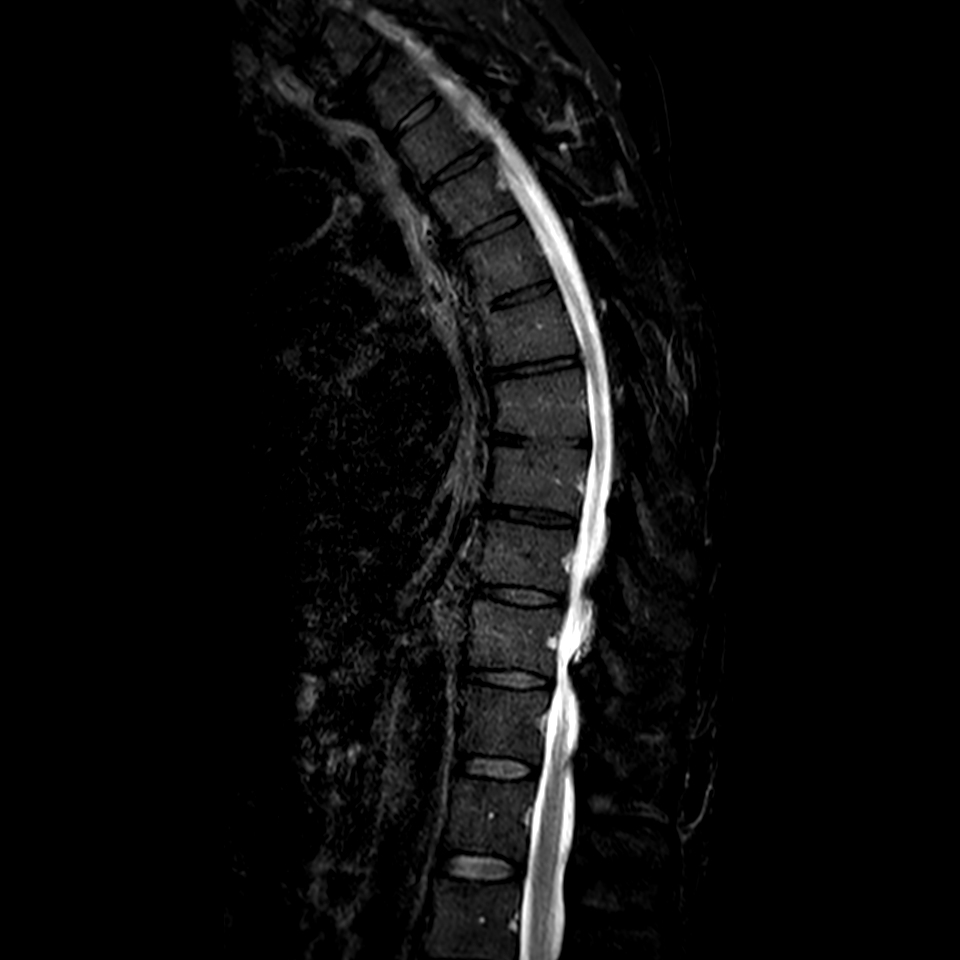

[8 of 48 positions shown; findings below may reference images not displayed]

FINDINGS: -------------------------------------------------------------------------------- 
------ 
GENERAL: 
ALIGNMENT: Mild exaggeration of normal thoracic kyphosis. 
VERTEBRAL BODY HEIGHT: Normal.  
MARROW SIGNAL: No focal suspect signal abnormality. 
CORD SIGNAL: Normal. 
ADDITIONAL FINDINGS: None. 
-------------------------------------------------------------------------------- 
------ 
RELEVANT SEGMENTAL (levels with severe stenosis or significant findings): 
T5-T6: Central disc herniation. Mild central canal narrowing. No significant 
neural foraminal narrowing. 
T6-T7: Central disc herniation causing deformity of the ventral cord with 
moderate central canal narrowing. No significant neural foraminal narrowing.  
T7-T8: Left central disc herniation with mild deformity of left ventral cord. 
Mild left-sided central canal narrowing. No significant neural foraminal 
narrowing. 
T8-T9: Right central disc herniation. Bilateral facet and ligamentum flavum 
hypertrophy. Mild central canal narrowing. Mild left neural foraminal narrowing. 
No significant right neural foraminal narrowing. 
T9-T10: Left facet hypertrophy greater than right. No significant central canal 
narrowing. Mild left lateral recess narrowing. Severe left neural foraminal 
narrowing. No significant right neural foraminal narrowing. 
T10-T11: Left greater than right facet hypertrophy. Deformity of left dorsal 
cord. Mild central canal narrowing with moderate left lateral recess narrowing. 
Moderate right neural foraminal narrowing. Mild left neural foraminal narrowing. 
T11-T12: Right facet hypertrophy greater than left. No significant central canal 
narrowing. No significant left neural foraminal narrowing. Severe right neural 
foraminal narrowing. 
T12-L1: Right greater than left facet hypertrophy. Deformity of the right dorsal 
cord with mild right sided central canal narrowing and moderate right lateral 
recess narrowing. Severe right neural foraminal narrowing. No significant left 
neural foraminal narrowing. 
-------------------------------------------------------------------------------- 
------
IMPRESSION: Discogenic/degenerative change in the thoracic spine as above.

## 2022-04-22 IMAGING — MR MRI LUMBAR SPINE WITHOUT CONTRAST
6 of 8 series · 15 of 48 positions shown · IV contrast (gadolinium)
Comparison: No prior lumbar examination

________________________________________________________________________________________________ 
MRI LUMBAR SPINE WITHOUT CONTRAST, 04/22/2022 [DATE]: 
CLINICAL INDICATION: Spinal stenosis
TECHNIQUE: Sagittal T1, Sagittal T2, Sagittal STIR, Axial T1 and Axial T2 MR 
images of the lumbar spine were performed without intravenous gadolinium 
enhancement.

[Series 701: t2w_cor-surv · coronal · 6.0mm · 0.62mm/px · 2 of 10 slices shown]
[im 1/10]
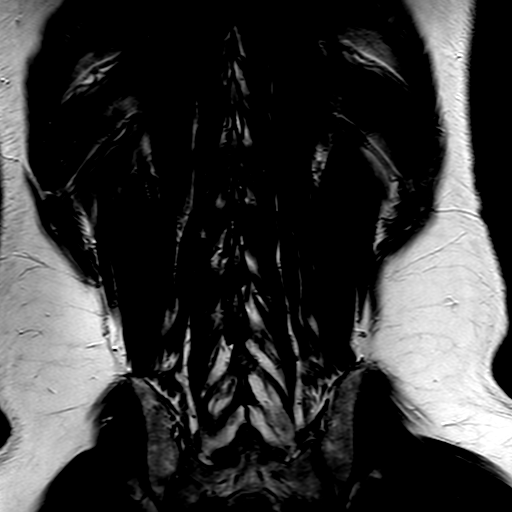
[im 10/10]
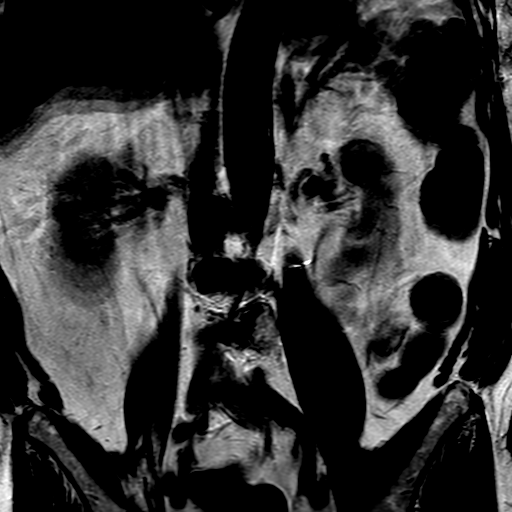

[Series 801: t1_tse_sag · sagittal · 4.0mm · 0.46mm/px · 3 of 19 slices shown]
[im 1/19]
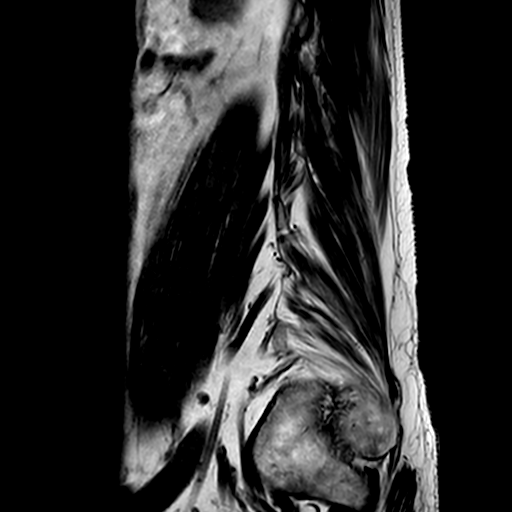
[im 10/19]
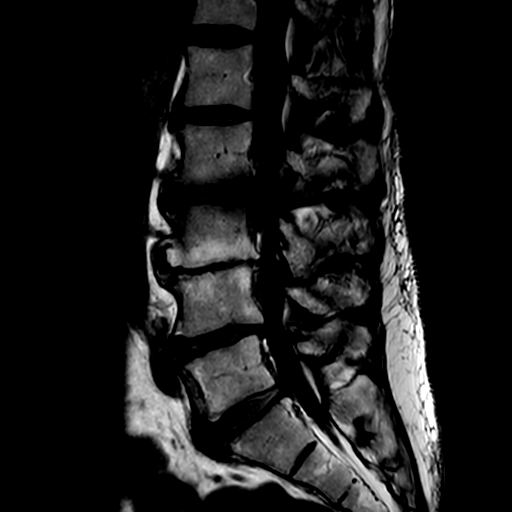
[im 19/19]
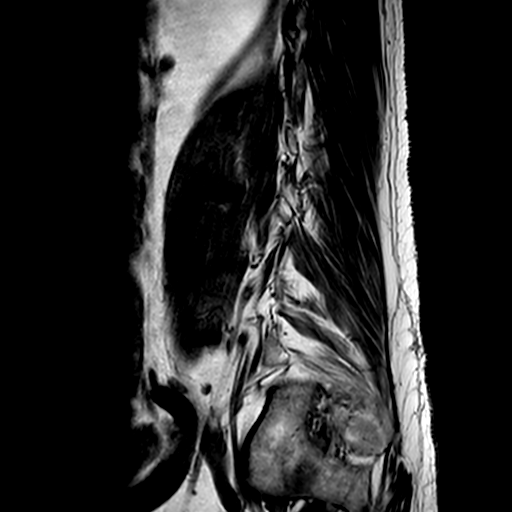

[Series 902: (id)_mdixon_tse · sagittal · 4.0mm · 0.37mm/px · 2 of 19 slices shown]
[im 1/19]
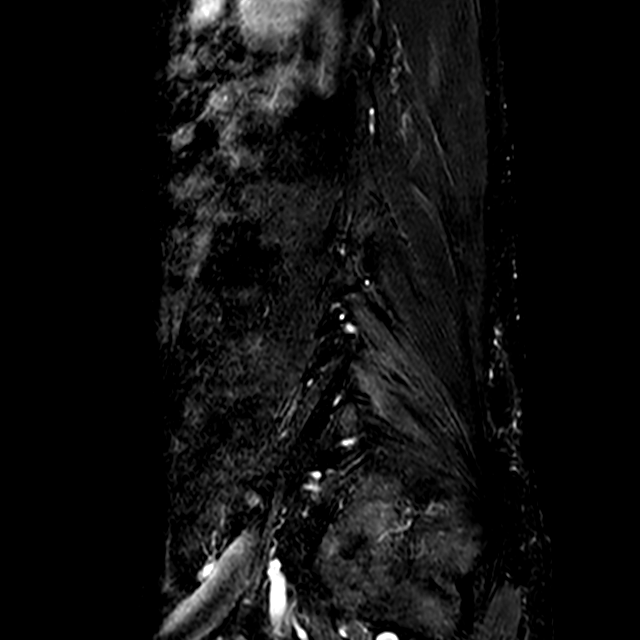
[im 19/19]
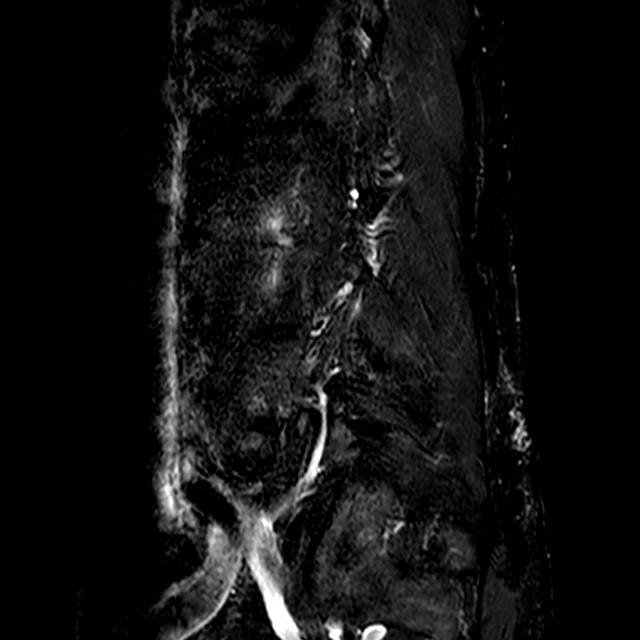

[Series 903: st2w_mdixon_tse · sagittal · 4.0mm · 0.37mm/px · 2 of 19 slices shown]
[im 1/19]
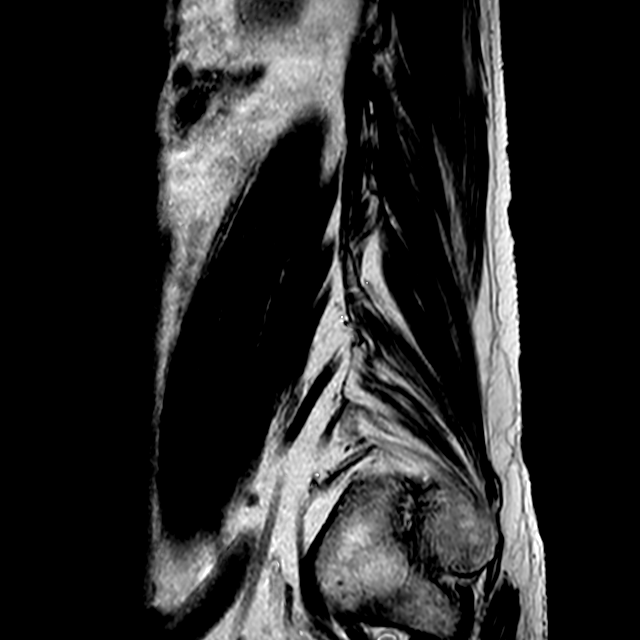
[im 19/19]
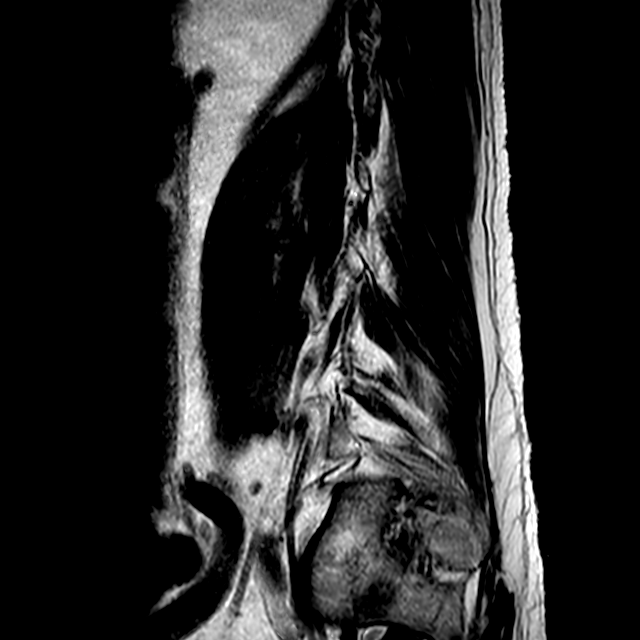

[Series 1002: (id) view_ax mpr · axial · 1.0mm · 0.25mm/px · 1 of 125 slices shown]
[im 9/125]
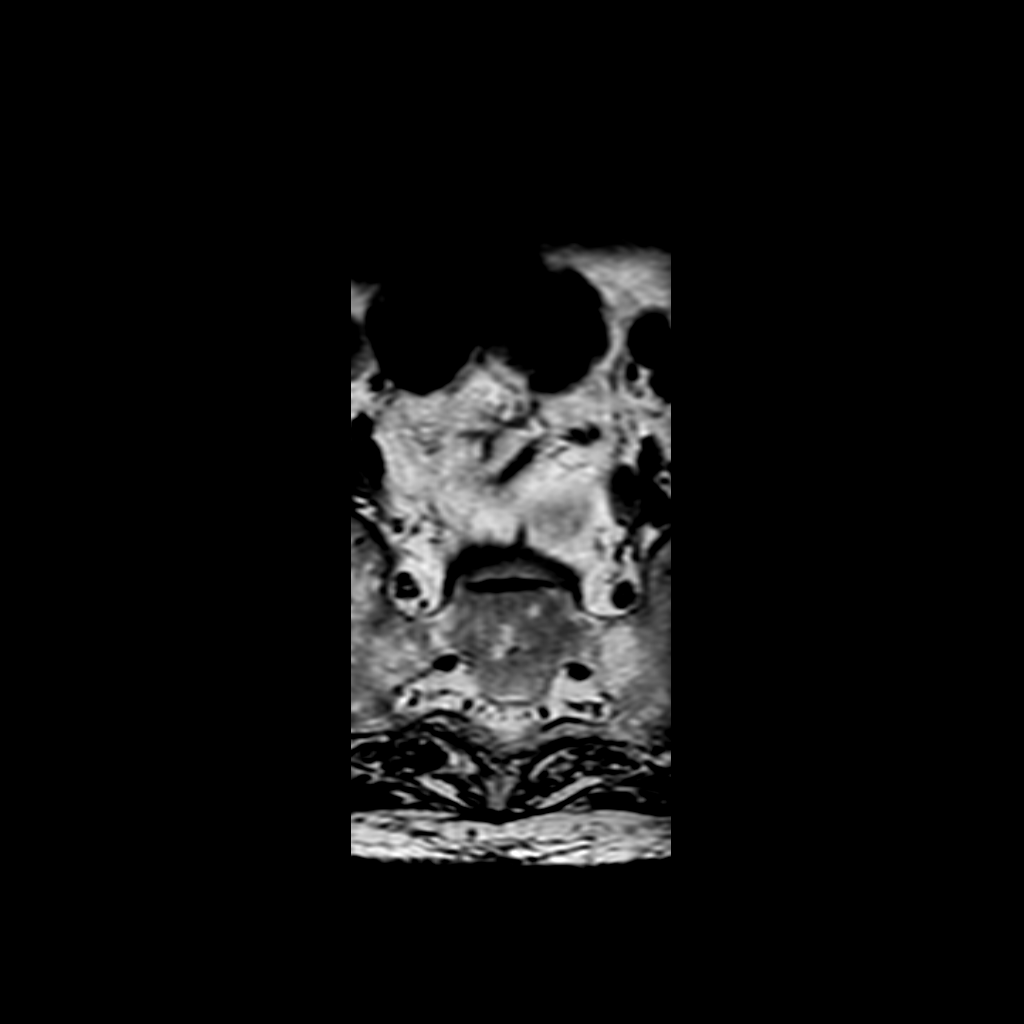

[Series 1201: T1 · axial · 4.0mm · 0.39mm/px · z∈[-461,-277]mm · 5 of 42 slices shown]
[im 1/42]
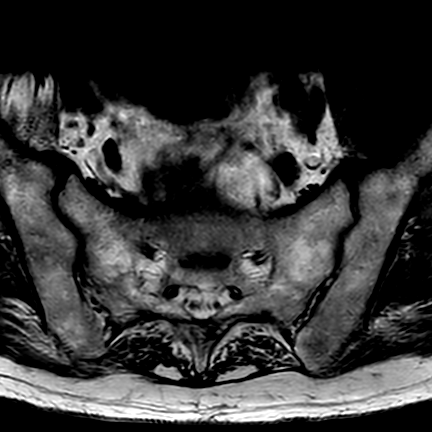
[im 11/42]
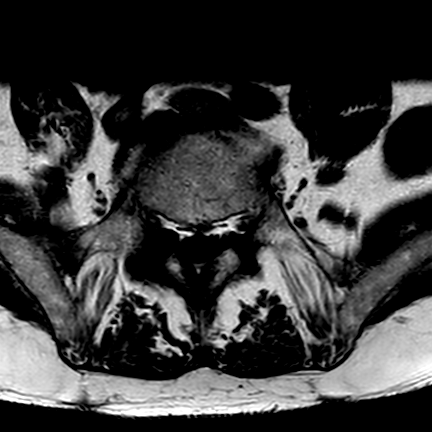
[im 21/42]
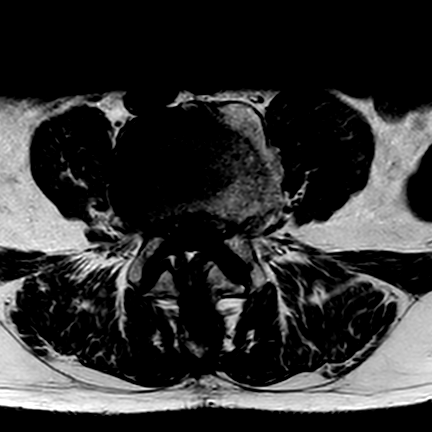
[im 31/42]
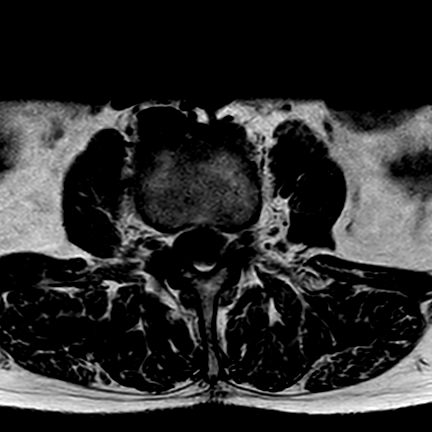
[im 42/42]
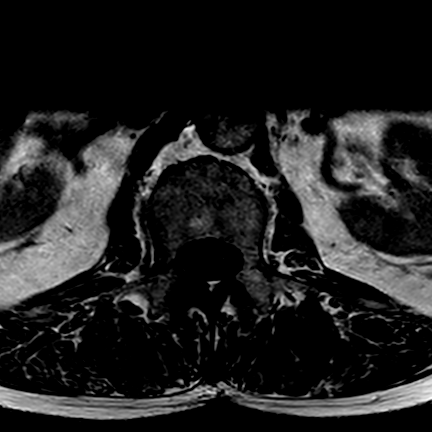

[15 of 48 positions shown; findings below may reference images not displayed]

FINDINGS: Lumbar vertebral heights are intact. There is marked disc narrowing at 
L3-4, moderate to marked narrowing at L2-3 and L4-5. Mild lumbar 
dextrocurvature. No evidence for malignancy. Conus terminates opposite L1. 
At L5-S1 the canal is open. There is lateral recess stenosis impinging the S1 
nerve roots. Marked facet hypertrophy. Foramina are open. 
At L4-5 there is 2-3 mm retrolisthesis. Disc bulge and moderate facet change and 
ligamentous thickening contributing to moderate to marked canal stenosis, worse 
on the right, impinging the right L5 nerve root. There is mild right foraminal 
stenosis, left foramen open. 
At L3-4 there is mild to moderate canal stenosis. Mild bilateral foraminal 
stenosis. 
At L2-3 there are large bilateral disc extrusions. The left-sided extrusion 
extends into the left L3 lateral recess, measuring 8 x 8 mm, extending 
inferiorly 15 mm as seen on sagittal image 11.. The right sided extrusion 
deforms the right ventral thecal sac and impinges the right L3 nerve root, 
measuring 8 x 6 mm. There is marked constriction of the thecal sac. Foramina are 
open. Moderate facet change with ligamentous thickening. 
At L1-2 the canal and foramina are open.
IMPRESSION: Large bilateral disc extrusions at L2-3 and impinges the L3 nerve roots 
bilaterally, producing marked constriction of the thecal sac.  
Moderate to marked canal stenosis at L4-5 is worse on the right, impinging the 
right L5 nerve root. 
Mild to moderate canal stenosis at L3-4. 
Modic type I change on the right at L2-3. 
Mild lumbar dextroscoliosis.

## 2022-07-29 ENCOUNTER — Ambulatory Visit: Payer: MEDICARE | Attending: Neurology

## 2022-08-26 ENCOUNTER — Ambulatory Visit: Payer: MEDICARE | Attending: Neurology

## 2023-01-12 IMAGING — CT CT CHEST WITH CONTRAST
2 of 4 series · 14 of 36 positions shown, 17 images · IV contrast (APPLIED)
Comparison: Chest x-ray 04/25/2022..

________________________________________________________________________________________________ 
CT CHEST WITH CONTRAST, 01/12/2023 [DATE]: 
CLINICAL INDICATION: Other nonspecific abnormal finding of lung field 
A search for DICOM formatted images was conducted for prior CT imaging studies 
completed at a non-affiliated media free facility.
TECHNIQUE: The chest was scanned from base of neck through the lung bases with 
75 mL of Isovue 300 MDV injected intravenously on a high resolution low dose CT 
scanner. Routine MPR and MIP reconstruction images were performed. The patients 
eGFR was calculated to be 82.0 mL/min/1.73 m2 using the i-STAT device.

[Series 4: chest with 2.0 i31s 3 · axial · 0.90mm/px · z∈[-382,-66]mm · 11 of 174 slices shown, 14 images]
[im 8/174  mediastinal]
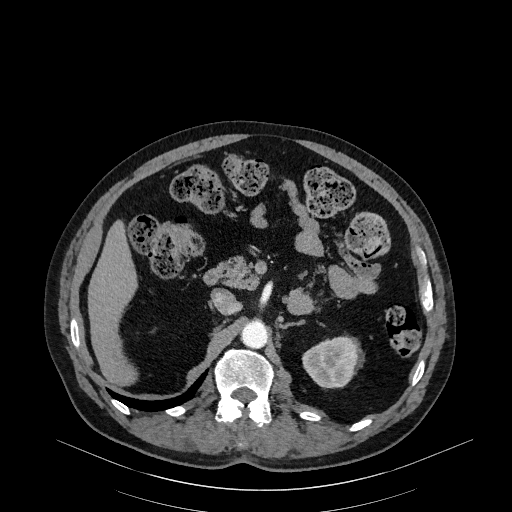
[im 8/174  lung]
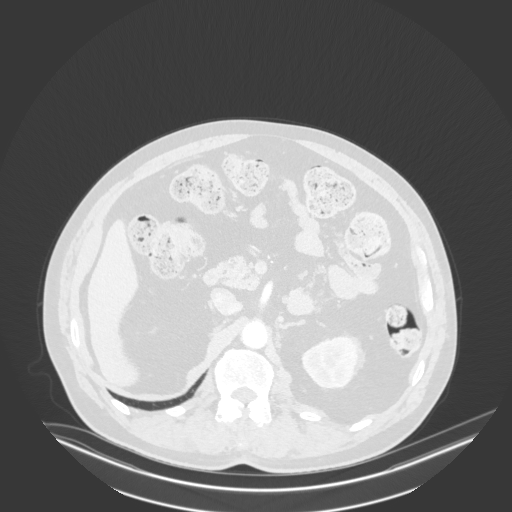
[im 24/174  lung]
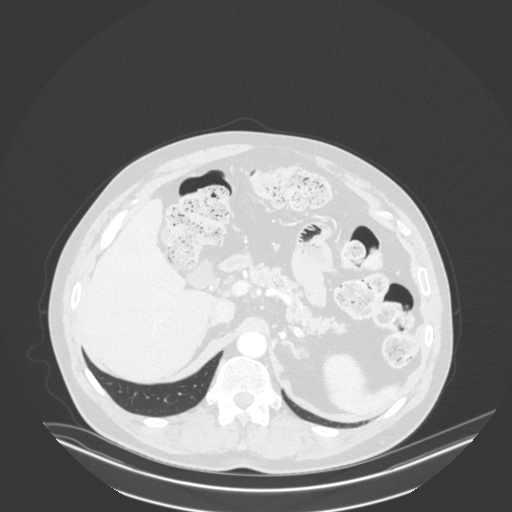
[im 40/174  lung]
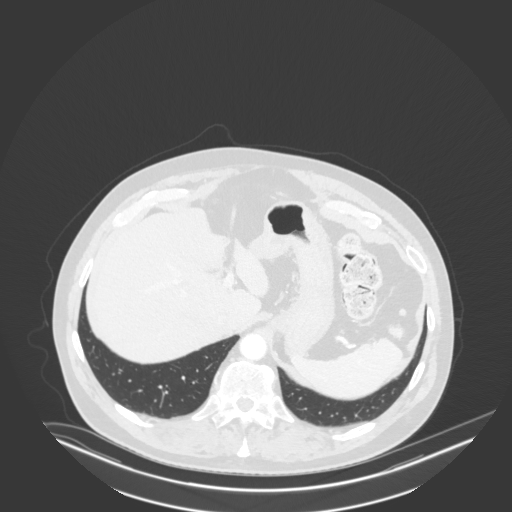
[im 56/174  lung]
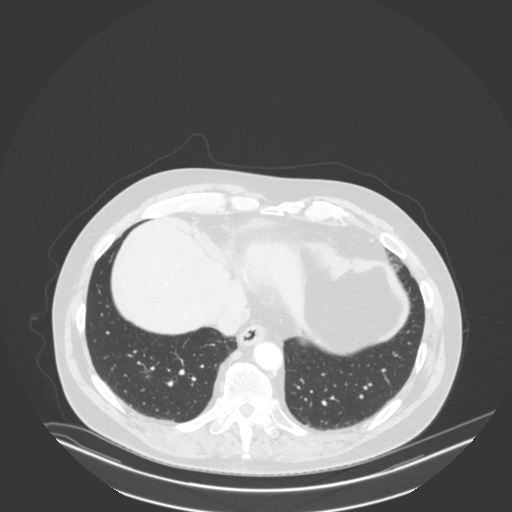
[im 71/174  mediastinal]
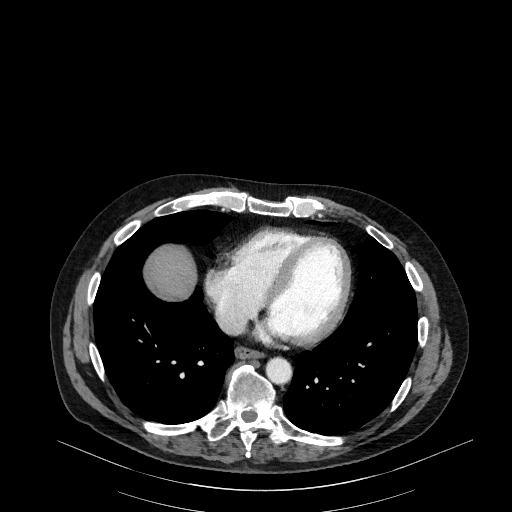
[im 71/174  lung]
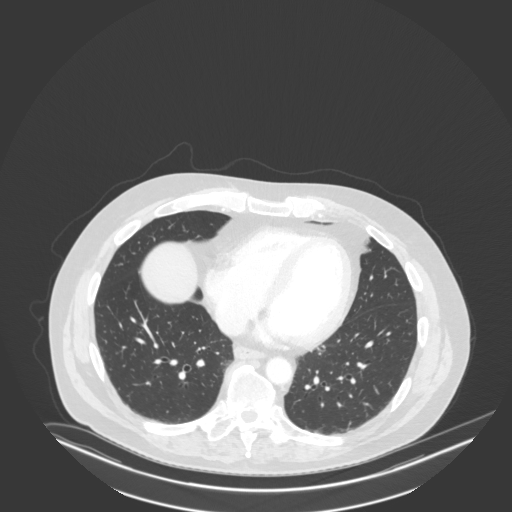
[im 87/174  lung]
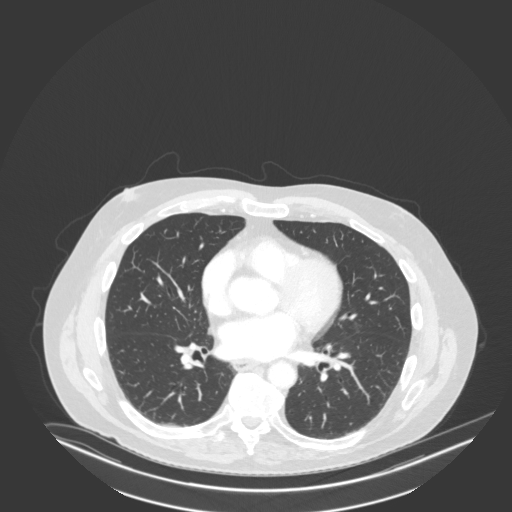
[im 103/174  lung]
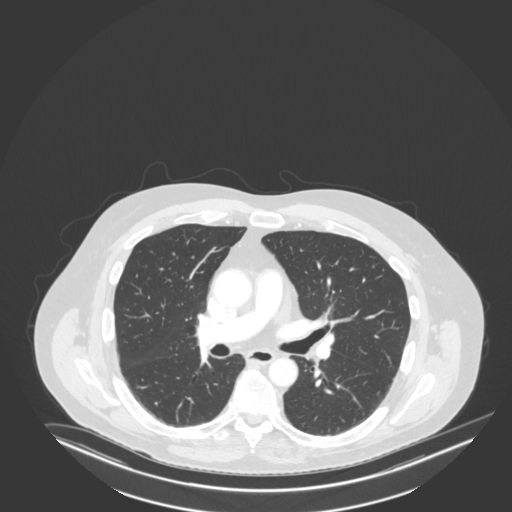
[im 118/174  lung]
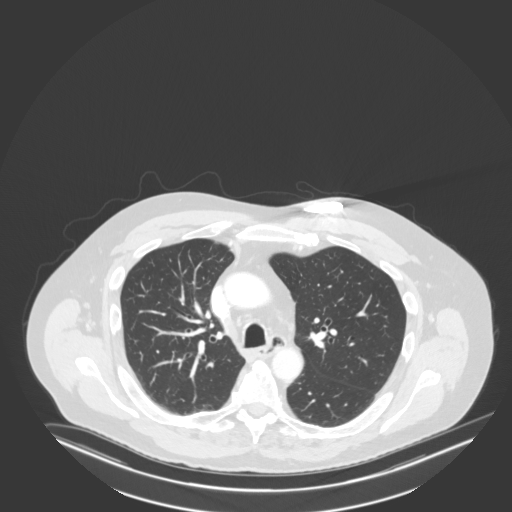
[im 134/174  mediastinal]
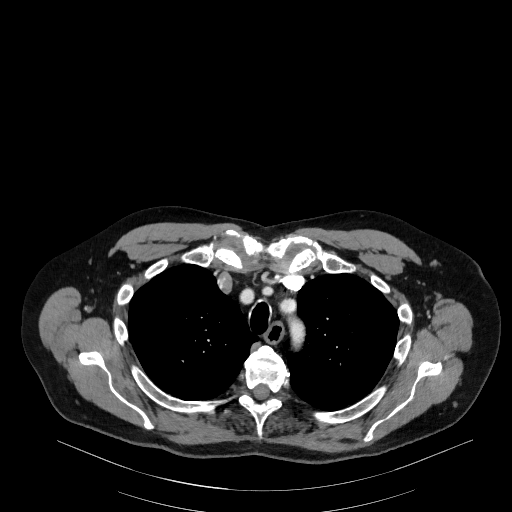
[im 134/174  lung]
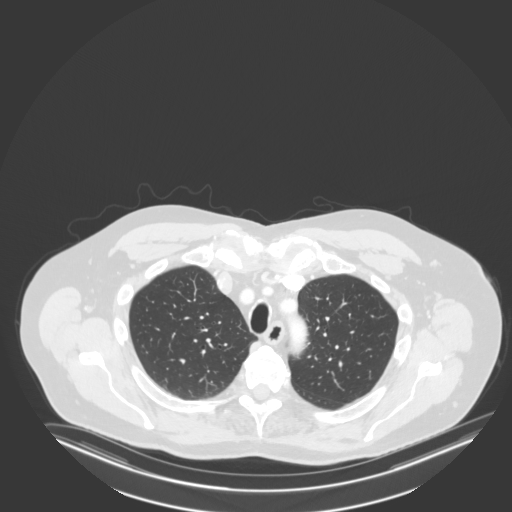
[im 150/174  lung]
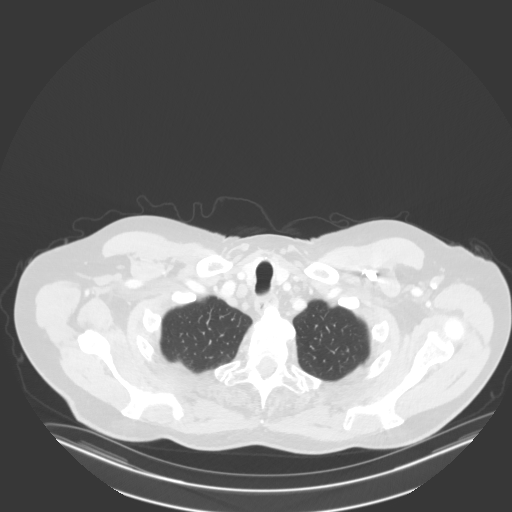
[im 166/174  lung]
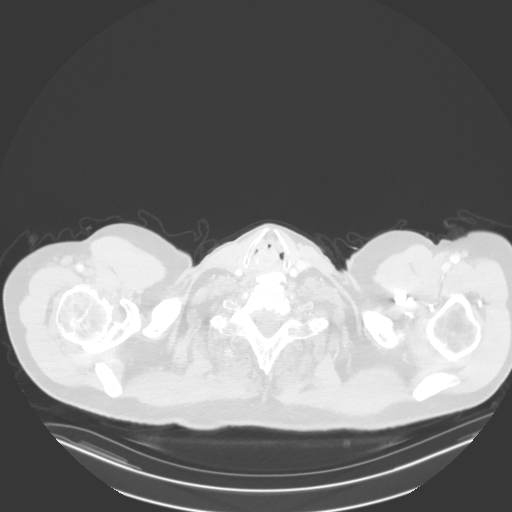

[Series 6: coronal · coronal · 0.69mm/px · 3 of 148 slices shown]
[im 30/148  lung]
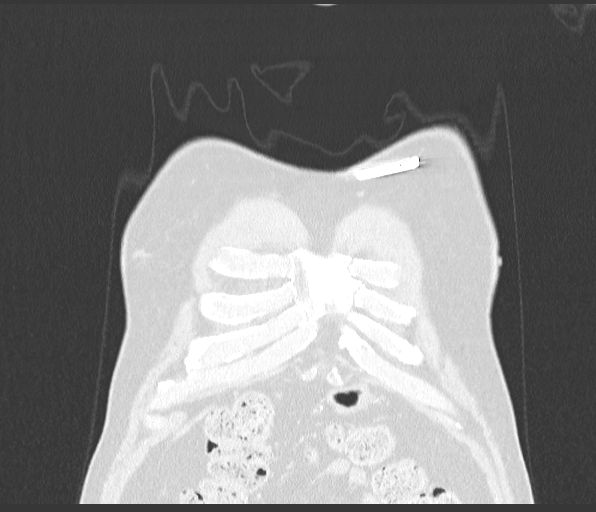
[im 59/148  lung]
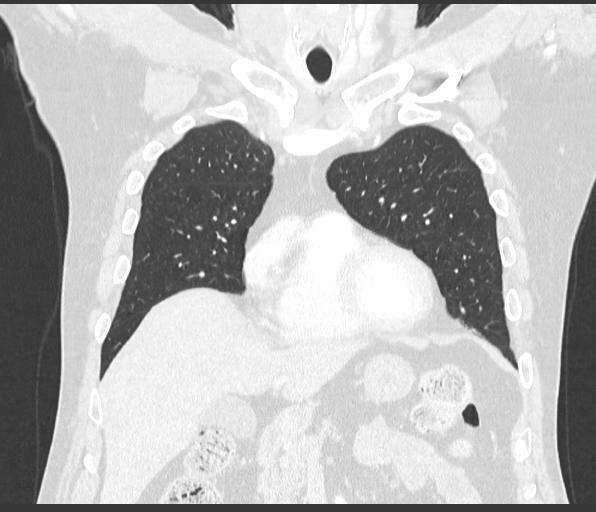
[im 89/148  lung]
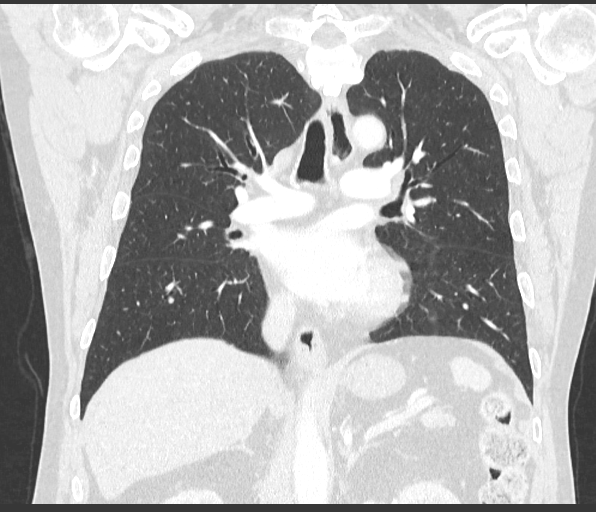

[14 of 36 positions shown; findings below may reference images not displayed]

Count of known CT and Cardiac Nuclear Medicine studies performed in the previous 
12 months = 1.
FINDINGS: LUNGS AND PLEURA:  Scattered noncalcified micronodules largest in the right 
lower lobe measuring 2 mm best seen on image 20/38. Mild peribronchial 
thickening with scattered atelectasis but no focal bronchiectasis. No acute 
consolidation or effusion.. No pleural effusion.  
MEDIASTINUM:  No adenopathy. Normal heart size. No pericardial effusion. 
Moderate coronary artery calcifications noted. 
CHEST WALL/AXILLA: No mass or adenopathy.  
UPPER ABDOMEN: Small hiatal hernia with mild distention of esophagus. Fatty 
liver. Cholelithiasis. 
MUSCULOSKELETAL: No acute abnormality.
IMPRESSION: Scattered noncalcified micronodules with the largest measuring 2 mm. Please 
refer to the [HOSPITAL] recommendations for follow-up below.  
Incidental, multiple solid nodules < 6 mm: 
Low Risk: No Routine follow-up.  
High risk: Optional CT at 12 months.  
NOTE: These recommendations do not apply to patients with immunosuppression or 
patients with known primary cancer.  
REFERENCE: [HOSPITAL] 6623 Guidelines for Management of Incidentally 
Detected Pulmonary Nodules in Adults. Radiology 6623. 
Mild peribronchial thickening but no focal bronchiectasis or acute 
consolidation. 
No mediastinal or hilar adenopathy. 
In patients between the ages of 50-77 where pulmonary emphysema is noted on CT, 
recommend evaluation for low dose lung cancer screening protocol if patient is 
not already enrolled; as pulmonary emphysema is an independent risk factor for 
lung cancer. 
RADIATION DOSE REDUCTION: All CT scans are performed using radiation dose 
reduction techniques, when applicable.  Technical factors are evaluated and 
adjusted to ensure appropriate moderation of exposure.  Automated dose 
management technology is applied to adjust the radiation doses to minimize 
exposure while achieving diagnostic quality images.
